# Patient Record
Sex: Male | Born: 1940 | Race: Black or African American | Hispanic: No | Marital: Single | Smoking: Current every day smoker
Health system: Southern US, Community
[De-identification: ages and names within clinical notes are randomized; demographics above are authoritative.]

## PROBLEM LIST (undated history)

## (undated) DIAGNOSIS — H3410 Central retinal artery occlusion, unspecified eye: Secondary | ICD-10-CM

## (undated) DIAGNOSIS — C189 Malignant neoplasm of colon, unspecified: Secondary | ICD-10-CM

---

## 2019-09-05 ENCOUNTER — Emergency Department (HOSPITAL_COMMUNITY): Payer: Medicare PPO

## 2019-09-05 ENCOUNTER — Emergency Department (HOSPITAL_COMMUNITY): Payer: Medicare PPO | Admitting: Anesthesiology

## 2019-09-05 ENCOUNTER — Encounter (HOSPITAL_COMMUNITY): Payer: Self-pay

## 2019-09-05 ENCOUNTER — Encounter (HOSPITAL_COMMUNITY): Admission: EM | Disposition: A | Discharge status: Home Health | Payer: Self-pay | Source: Home / Self Care

## 2019-09-05 ENCOUNTER — Inpatient Hospital Stay (HOSPITAL_COMMUNITY)
Admission: EM | Admit: 2019-09-05 | Discharge: 2019-09-20 | DRG: 326 | Disposition: A | Discharge status: Home Health | Payer: Medicare PPO | Attending: General Surgery | Admitting: General Surgery

## 2019-09-05 ENCOUNTER — Other Ambulatory Visit: Payer: Self-pay

## 2019-09-05 DIAGNOSIS — K650 Generalized (acute) peritonitis: Secondary | ICD-10-CM | POA: Diagnosis present

## 2019-09-05 DIAGNOSIS — E860 Dehydration: Secondary | ICD-10-CM | POA: Diagnosis present

## 2019-09-05 DIAGNOSIS — D689 Coagulation defect, unspecified: Secondary | ICD-10-CM

## 2019-09-05 DIAGNOSIS — Z79899 Other long term (current) drug therapy: Secondary | ICD-10-CM | POA: Diagnosis not present

## 2019-09-05 DIAGNOSIS — T8143XA Infection following a procedure, organ and space surgical site, initial encounter: Secondary | ICD-10-CM

## 2019-09-05 DIAGNOSIS — R188 Other ascites: Secondary | ICD-10-CM

## 2019-09-05 DIAGNOSIS — J90 Pleural effusion, not elsewhere classified: Secondary | ICD-10-CM | POA: Diagnosis present

## 2019-09-05 DIAGNOSIS — K750 Abscess of liver: Secondary | ICD-10-CM | POA: Diagnosis present

## 2019-09-05 DIAGNOSIS — I10 Essential (primary) hypertension: Secondary | ICD-10-CM | POA: Diagnosis present

## 2019-09-05 DIAGNOSIS — K567 Ileus, unspecified: Secondary | ICD-10-CM

## 2019-09-05 DIAGNOSIS — J9819 Other pulmonary collapse: Secondary | ICD-10-CM | POA: Diagnosis not present

## 2019-09-05 DIAGNOSIS — K255 Chronic or unspecified gastric ulcer with perforation: Secondary | ICD-10-CM | POA: Diagnosis present

## 2019-09-05 DIAGNOSIS — F17210 Nicotine dependence, cigarettes, uncomplicated: Secondary | ICD-10-CM | POA: Diagnosis present

## 2019-09-05 DIAGNOSIS — K9189 Other postprocedural complications and disorders of digestive system: Secondary | ICD-10-CM | POA: Diagnosis present

## 2019-09-05 DIAGNOSIS — R198 Other specified symptoms and signs involving the digestive system and abdomen: Secondary | ICD-10-CM

## 2019-09-05 DIAGNOSIS — N289 Disorder of kidney and ureter, unspecified: Secondary | ICD-10-CM | POA: Diagnosis present

## 2019-09-05 DIAGNOSIS — Z20822 Contact with and (suspected) exposure to covid-19: Secondary | ICD-10-CM | POA: Diagnosis present

## 2019-09-05 DIAGNOSIS — N401 Enlarged prostate with lower urinary tract symptoms: Secondary | ICD-10-CM | POA: Diagnosis present

## 2019-09-05 DIAGNOSIS — E279 Disorder of adrenal gland, unspecified: Secondary | ICD-10-CM | POA: Diagnosis present

## 2019-09-05 DIAGNOSIS — B9681 Helicobacter pylori [H. pylori] as the cause of diseases classified elsewhere: Secondary | ICD-10-CM | POA: Diagnosis present

## 2019-09-05 DIAGNOSIS — K668 Other specified disorders of peritoneum: Secondary | ICD-10-CM

## 2019-09-05 DIAGNOSIS — K651 Peritoneal abscess: Secondary | ICD-10-CM | POA: Diagnosis present

## 2019-09-05 DIAGNOSIS — R066 Hiccough: Secondary | ICD-10-CM | POA: Diagnosis not present

## 2019-09-05 DIAGNOSIS — K913 Postprocedural intestinal obstruction, unspecified as to partial versus complete: Secondary | ICD-10-CM | POA: Diagnosis not present

## 2019-09-05 DIAGNOSIS — R0602 Shortness of breath: Secondary | ICD-10-CM

## 2019-09-05 DIAGNOSIS — K265 Chronic or unspecified duodenal ulcer with perforation: Secondary | ICD-10-CM | POA: Diagnosis present

## 2019-09-05 HISTORY — PX: BOWEL RESECTION: SHX1257

## 2019-09-05 HISTORY — DX: Disorder of kidney and ureter, unspecified: N28.9

## 2019-09-05 HISTORY — PX: LAPAROTOMY: SHX154

## 2019-09-05 HISTORY — DX: Chronic or unspecified gastric ulcer with perforation: K25.5

## 2019-09-05 LAB — CBC WITH DIFFERENTIAL/PLATELET
Abs Immature Granulocytes: 0.01 10*3/uL (ref 0.00–0.07)
Basophils Absolute: 0 10*3/uL (ref 0.0–0.1)
Basophils Relative: 1 %
Eosinophils Absolute: 0 10*3/uL (ref 0.0–0.5)
Eosinophils Relative: 0 %
HCT: 59.7 % — ABNORMAL HIGH (ref 39.0–52.0)
Hemoglobin: 18.9 g/dL — ABNORMAL HIGH (ref 13.0–17.0)
Immature Granulocytes: 0 %
Lymphocytes Relative: 12 %
Lymphs Abs: 0.5 10*3/uL — ABNORMAL LOW (ref 0.7–4.0)
MCH: 26.7 pg (ref 26.0–34.0)
MCHC: 31.7 g/dL (ref 30.0–36.0)
MCV: 84.4 fL (ref 80.0–100.0)
Monocytes Absolute: 0.3 10*3/uL (ref 0.1–1.0)
Monocytes Relative: 7 %
Neutro Abs: 3.2 10*3/uL (ref 1.7–7.7)
Neutrophils Relative %: 80 %
Platelets: 273 10*3/uL (ref 150–400)
RBC: 7.07 MIL/uL — ABNORMAL HIGH (ref 4.22–5.81)
RDW: 15.4 % (ref 11.5–15.5)
WBC: 4.1 10*3/uL (ref 4.0–10.5)
nRBC: 0 % (ref 0.0–0.2)

## 2019-09-05 LAB — COMPREHENSIVE METABOLIC PANEL
ALT: 17 U/L (ref 0–44)
AST: 29 U/L (ref 15–41)
Albumin: 3.8 g/dL (ref 3.5–5.0)
Alkaline Phosphatase: 85 U/L (ref 38–126)
Anion gap: 15 (ref 5–15)
BUN: 15 mg/dL (ref 8–23)
CO2: 21 mmol/L — ABNORMAL LOW (ref 22–32)
Calcium: 9.1 mg/dL (ref 8.9–10.3)
Chloride: 101 mmol/L (ref 98–111)
Creatinine, Ser: 1.4 mg/dL — ABNORMAL HIGH (ref 0.61–1.24)
GFR calc Af Amer: 56 mL/min — ABNORMAL LOW (ref >60)
GFR calc non Af Amer: 48 mL/min — ABNORMAL LOW (ref >60)
Glucose, Bld: 160 mg/dL — ABNORMAL HIGH (ref 70–99)
Potassium: 4.8 mmol/L (ref 3.5–5.1)
Sodium: 137 mmol/L (ref 135–145)
Total Bilirubin: 1.1 mg/dL (ref 0.3–1.2)
Total Protein: 7.4 g/dL (ref 6.5–8.1)

## 2019-09-05 LAB — TYPE AND SCREEN
ABO/RH(D): O POS
Antibody Screen: NEGATIVE

## 2019-09-05 LAB — RESPIRATORY PANEL BY RT PCR (FLU A&B, COVID)
Influenza A by PCR: NEGATIVE
Influenza B by PCR: NEGATIVE
SARS Coronavirus 2 by RT PCR: NEGATIVE

## 2019-09-05 LAB — APTT: aPTT: 78 seconds — ABNORMAL HIGH (ref 24–36)

## 2019-09-05 LAB — LIPASE, BLOOD: Lipase: 21 U/L (ref 11–51)

## 2019-09-05 LAB — ABO/RH: ABO/RH(D): O POS

## 2019-09-05 LAB — MRSA PCR SCREENING: MRSA by PCR: NEGATIVE

## 2019-09-05 LAB — PROTIME-INR
INR: 4.8 (ref 0.8–1.2)
INR: 1.6 — ABNORMAL HIGH (ref 0.8–1.2)
Prothrombin Time: 44.9 seconds — ABNORMAL HIGH (ref 11.4–15.2)
Prothrombin Time: 19.1 seconds — ABNORMAL HIGH (ref 11.4–15.2)

## 2019-09-05 IMAGING — CT CT ABD-PELV W/ CM
2 of 5 series · 15 of 46 positions shown, 17 images · IV contrast (omnipaque)
Comparison: None.

CLINICAL DATA: Onset abdominal pain yesterday afternoon after
eating.

EXAM:
CT ABDOMEN AND PELVIS WITH CONTRAST
TECHNIQUE: Multidetector CT imaging of the abdomen and pelvis was performed
using the standard protocol following bolus administration of
intravenous contrast.
CONTRAST:  100mL OMNIPAQUE IOHEXOL 300 MG/ML  SOLN

[Series 2: axial st · axial · 0.68mm/px · z∈[-414,-19]mm · 12 of 93 slices shown, 14 images]
[im 7/93  soft-tissue]
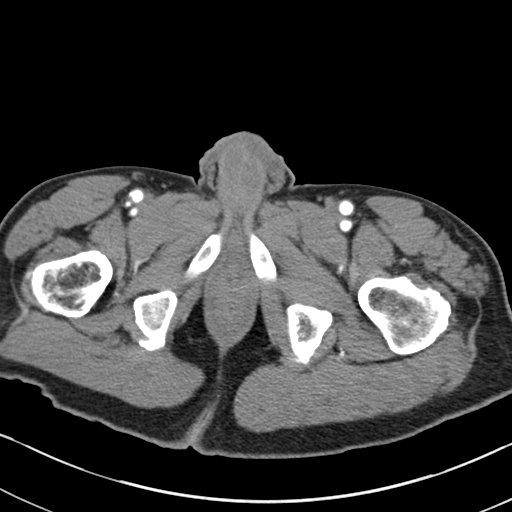
[im 7/93  bone]
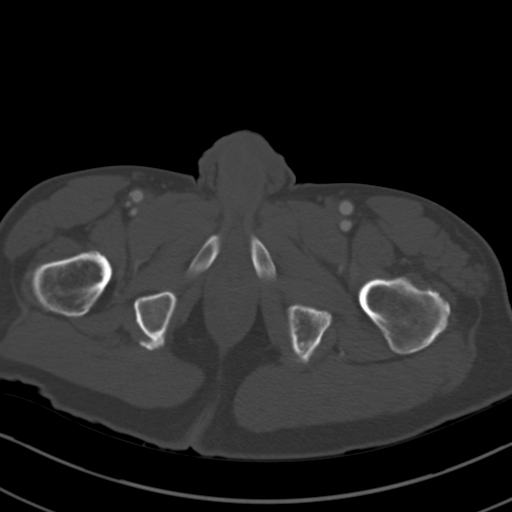
[im 13/93  soft-tissue]
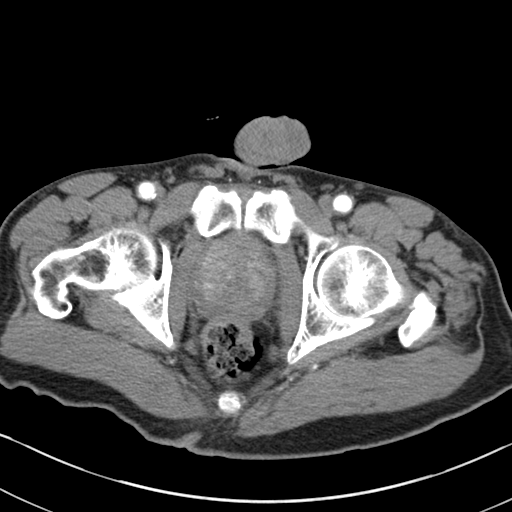
[im 19/93  soft-tissue]
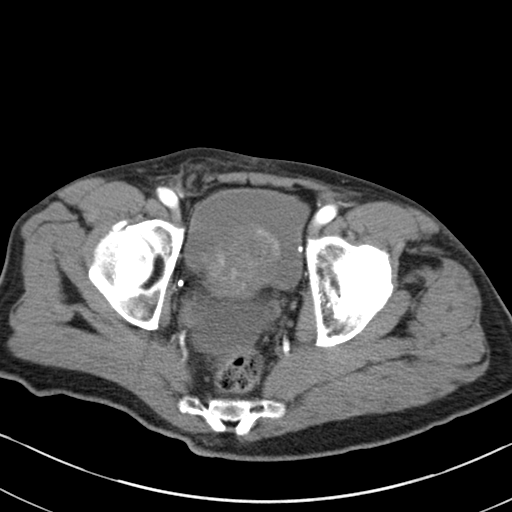
[im 31/93  soft-tissue]
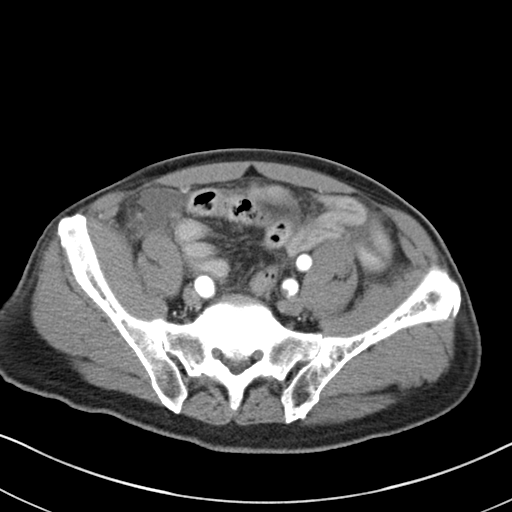
[im 37/93  soft-tissue]
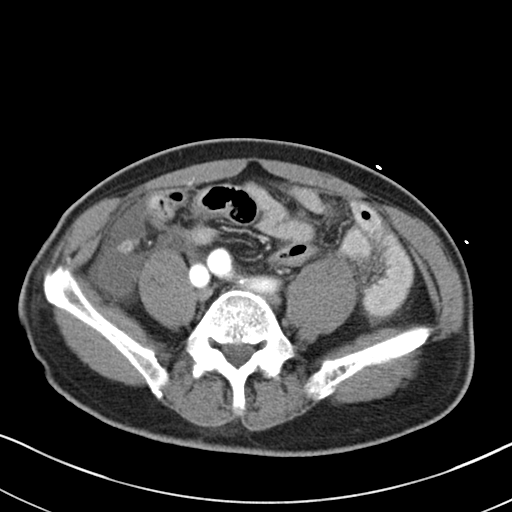
[im 43/93  soft-tissue]
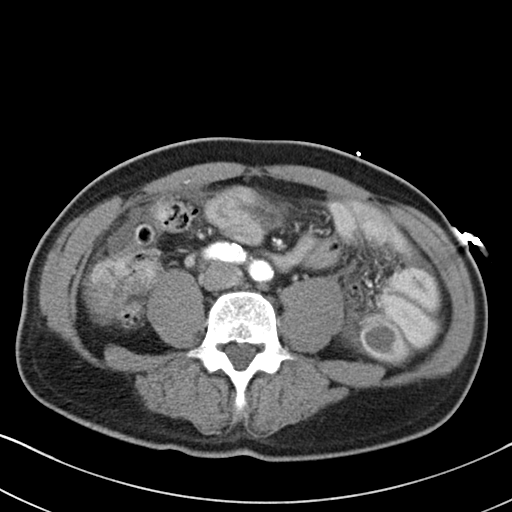
[im 50/93  soft-tissue]
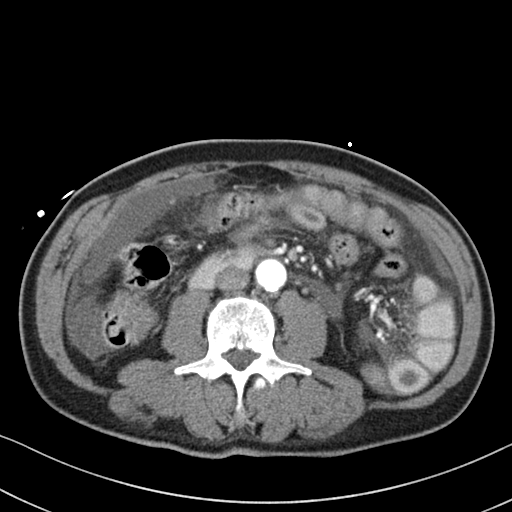
[im 56/93  soft-tissue]
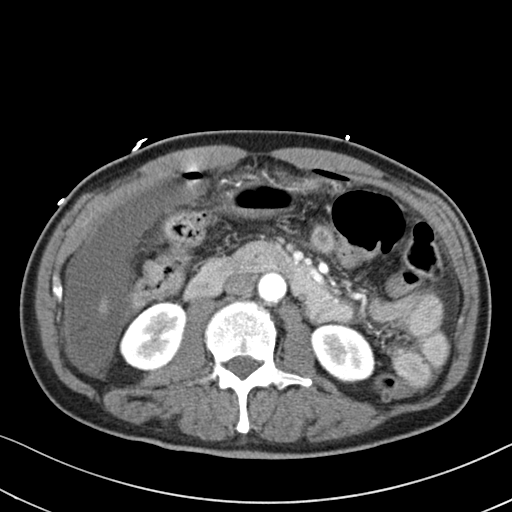
[im 62/93  soft-tissue]
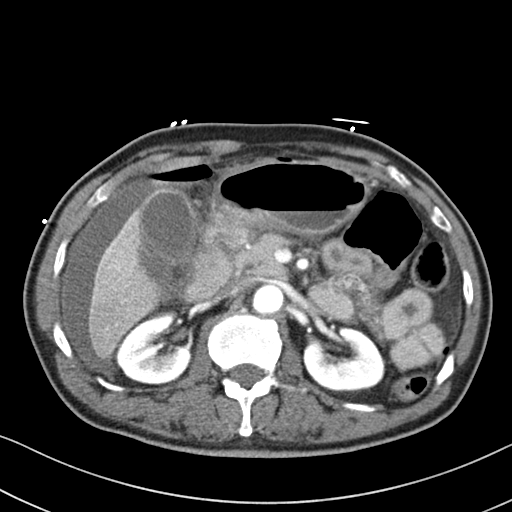
[im 62/93  bone]
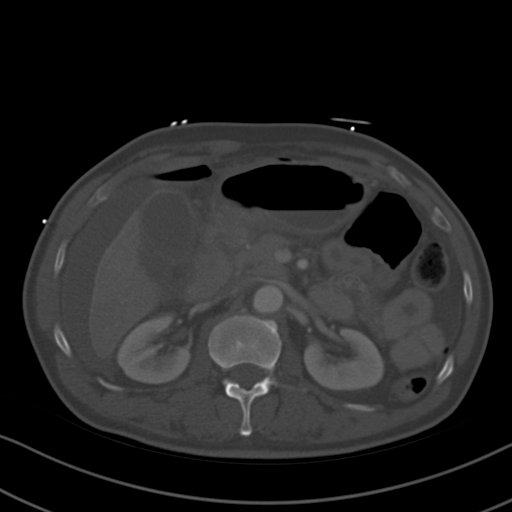
[im 74/93  soft-tissue]
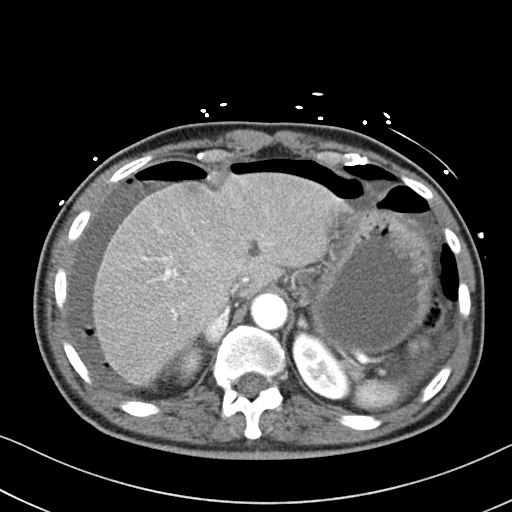
[im 80/93  soft-tissue]
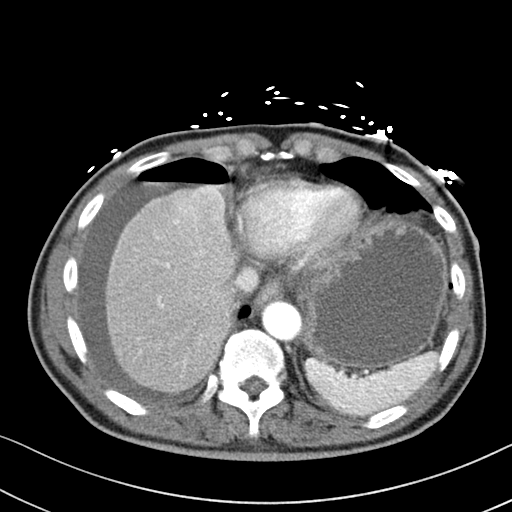
[im 86/93  soft-tissue]
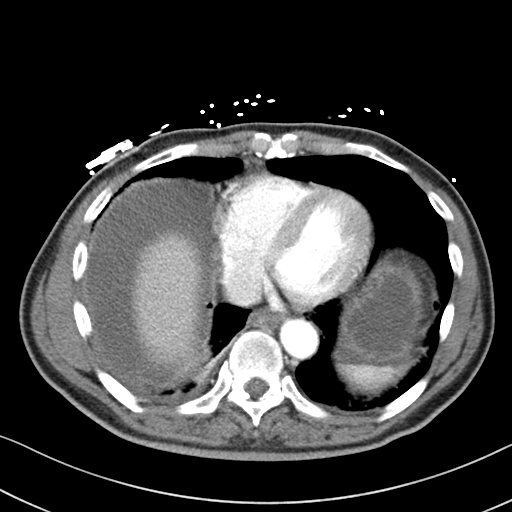

[Series 5: coronal st · coronal · 0.67mm/px · 3 of 123 slices shown]
[im 41/123  soft-tissue]
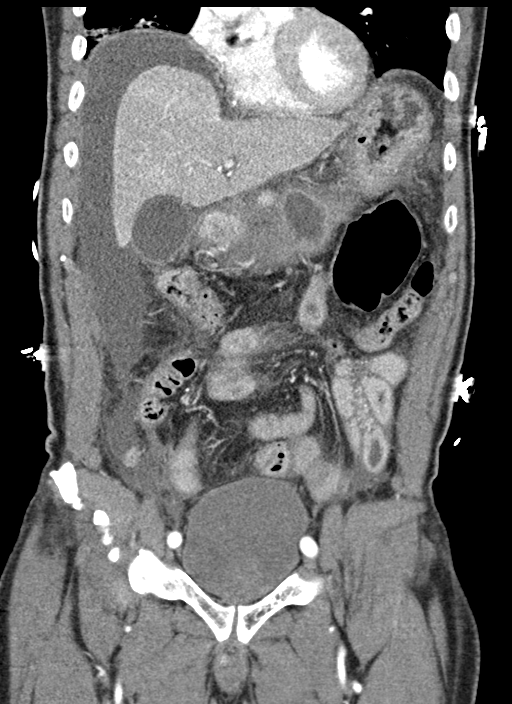
[im 55/123  soft-tissue]
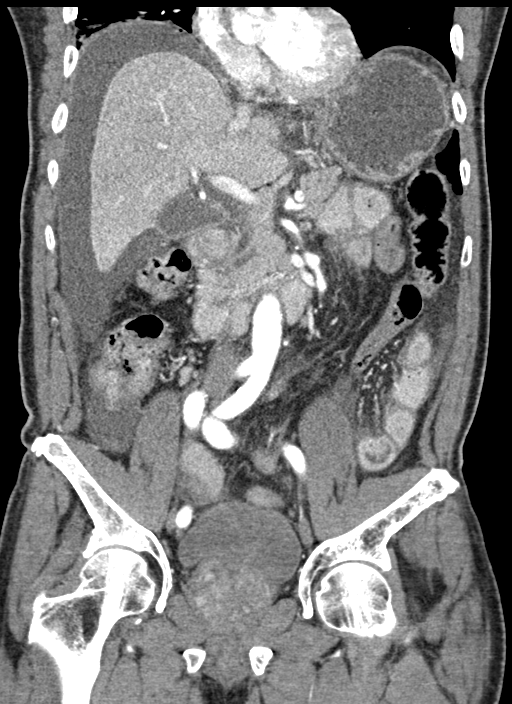
[im 68/123  soft-tissue]
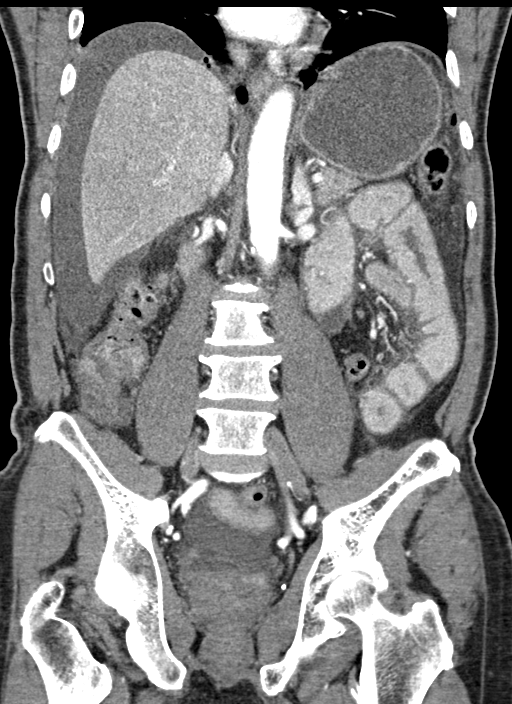

[15 of 46 positions shown; findings below may reference images not displayed]

FINDINGS: Lower chest: Small right and trace left pleural effusions. Basilar
atelectasis on the right.

Hepatobiliary: No focal liver abnormality is seen. No gallstones,
gallbladder wall thickening, or biliary dilatation.

Pancreas: Unremarkable. No pancreatic ductal dilatation or
surrounding inflammatory changes.

Spleen: Normal in size without focal abnormality.

Adrenals/Urinary Tract: Adrenal glands are unremarkable. Kidneys are
normal, without renal calculi, focal lesion, or hydronephrosis.
Bladder is unremarkable.

Stomach/Bowel: There is a large volume of free intraperitoneal air
and moderate free fluid. The walls of the duodenum are thickened and
hyperenhancing and a focal perforation in the medial wall of the
duodenum is best seen on image 16 of series 7. Abnormal enhancement
and wall thickening in multiple loops of small bowel in the left
abdomen is likely reactive from perforation and free fluid. The
colon and appendix are unremarkable. There is no pneumatosis, portal
venous gas or free intraperitoneal air. No evidence of bowel
obstruction.

Vascular/Lymphatic: No significant vascular findings are present. No
enlarged abdominal or pelvic lymph nodes.

Reproductive: Marked prostatomegaly.

Other: None.

Musculoskeletal: No acute or focal abnormality.
IMPRESSION: Findings most consistent with perforation of the duodenum with an
associated free intraperitoneal air and fluid.

Small right and trace left pleural effusions.

Marked prostatomegaly.

Critical Value/emergent results were called by telephone at the time
, who verbally acknowledged these results.

## 2019-09-05 IMAGING — DX DG CHEST 1V PORT
1 series · 1 of 1 positions shown · non-contrast
Comparison: None.

CLINICAL DATA: Abdominal pain, nausea

EXAM:
PORTABLE CHEST 1 VIEW

[chest ap]
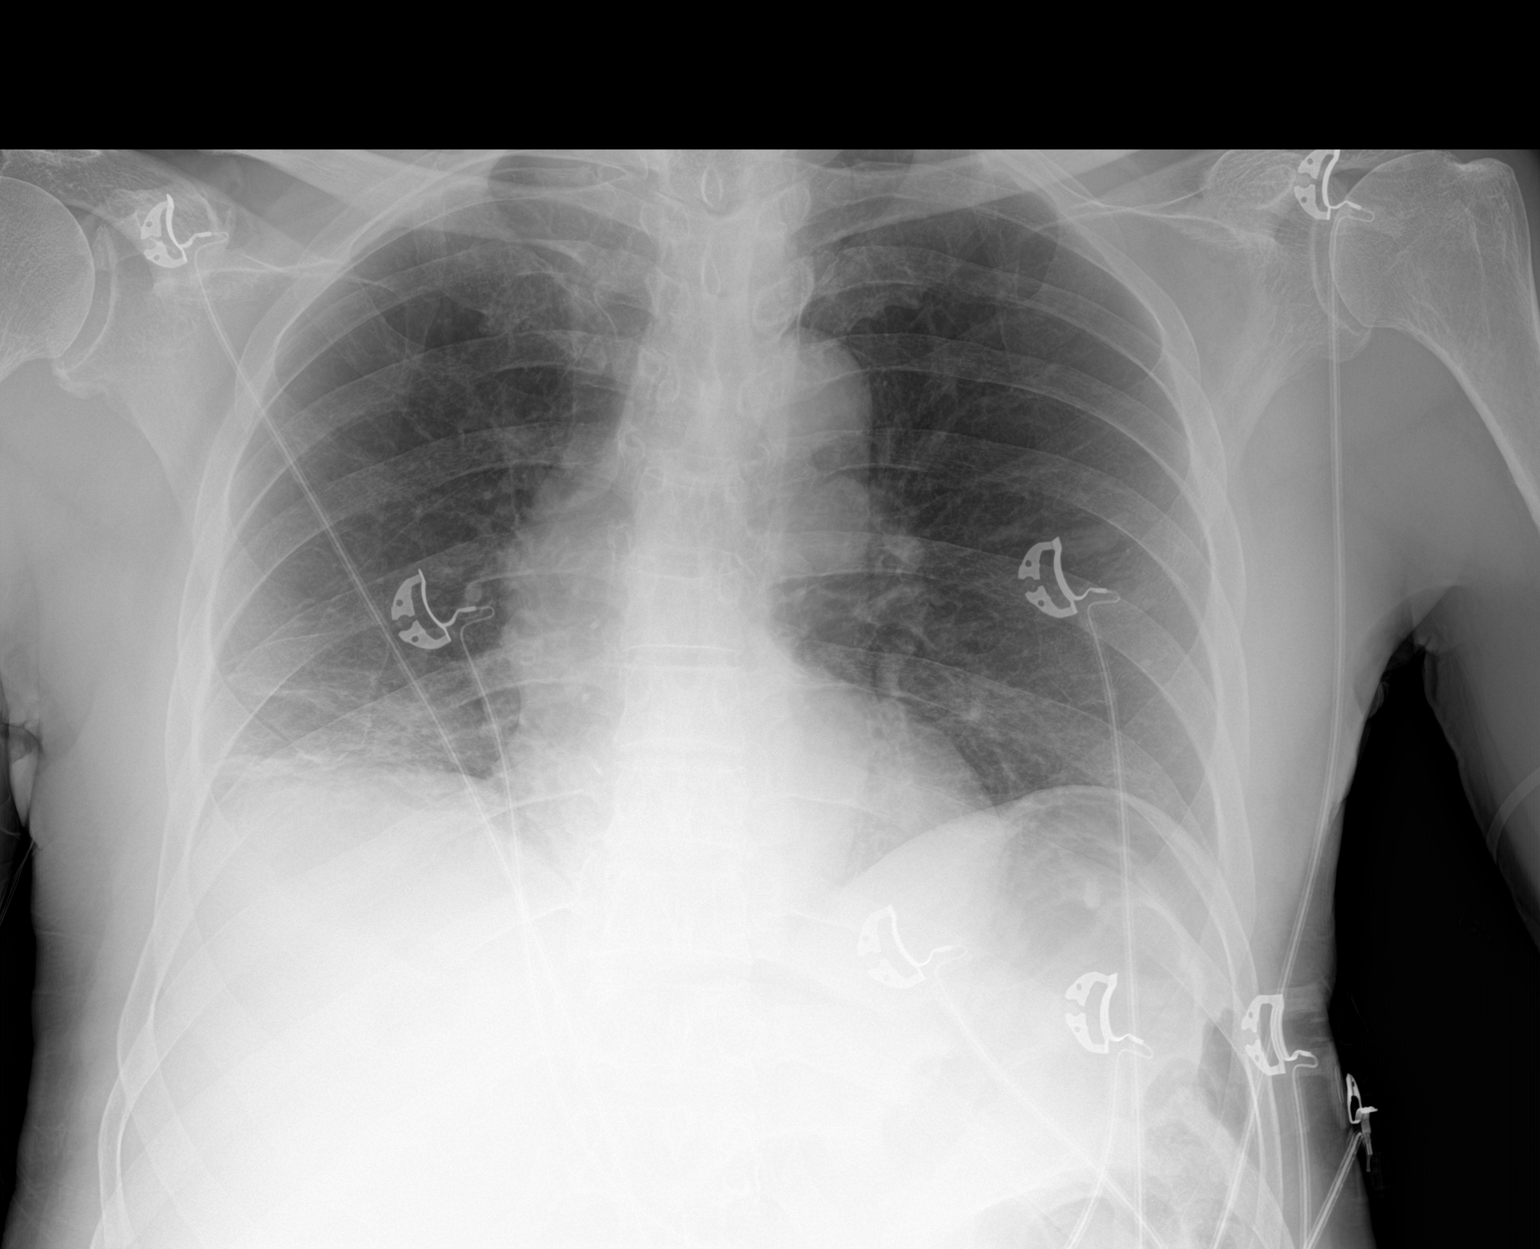

[1 of 1 positions shown; findings below may reference images not displayed]

FINDINGS: The heart size and mediastinal contours are within normal limits.
Streaky opacity within the right lung base. Left lung appears clear.
No pleural effusion or pneumothorax. No acute osseous findings.
IMPRESSION: Streaky opacity within the right lung base which may reflect
atelectasis or pneumonia.

## 2019-09-05 SURGERY — LAPAROTOMY, EXPLORATORY
Anesthesia: General | Site: Abdomen

## 2019-09-05 MED ORDER — OXYCODONE HCL 5 MG/5ML PO SOLN: 5.0000 mg | Freq: Once | ORAL | Status: DC | PRN

## 2019-09-05 MED ORDER — MAGIC MOUTHWASH
15.0000 mL | Freq: Four times a day (QID) | ORAL | Status: DC | PRN
  Administered 2019-09-05 – 2019-09-06 (×2): 15 mL via ORAL
  Filled 2019-09-05 (×3): qty 15

## 2019-09-05 MED ORDER — SODIUM CHLORIDE 0.9 % IV SOLN
INTRAVENOUS | Status: DC | PRN
  Administered 2019-09-05: 250 mL via INTRAVENOUS

## 2019-09-05 MED ORDER — LACTATED RINGERS IV BOLUS: 1000.0000 mL | Freq: Three times a day (TID) | INTRAVENOUS | Status: AC | PRN

## 2019-09-05 MED ORDER — SODIUM CHLORIDE 0.9 % IV SOLN
8.0000 mg/h | INTRAVENOUS | Status: AC
  Administered 2019-09-05 – 2019-09-09 (×6): 8 mg/h via INTRAVENOUS
  Filled 2019-09-05 (×12): qty 80

## 2019-09-05 MED ORDER — ADULT MULTIVITAMIN W/MINERALS CH
1.0000 | ORAL_TABLET | Freq: Every day | ORAL | Status: DC
  Administered 2019-09-06 – 2019-09-14 (×8): 1 via ORAL
  Filled 2019-09-05 (×8): qty 1

## 2019-09-05 MED ORDER — IOHEXOL 300 MG/ML  SOLN
100.0000 mL | Freq: Once | INTRAMUSCULAR | Status: AC | PRN
  Administered 2019-09-05: 100 mL via INTRAVENOUS

## 2019-09-05 MED ORDER — ONDANSETRON HCL 4 MG/2ML IJ SOLN
INTRAMUSCULAR | Status: AC
  Filled 2019-09-05: qty 2

## 2019-09-05 MED ORDER — LORAZEPAM 1 MG PO TABS: 1.0000 mg | ORAL_TABLET | ORAL | Status: AC | PRN

## 2019-09-05 MED ORDER — OXYCODONE HCL 5 MG PO TABS: 5.0000 mg | ORAL_TABLET | Freq: Once | ORAL | Status: DC | PRN

## 2019-09-05 MED ORDER — ROCURONIUM BROMIDE 100 MG/10ML IV SOLN
INTRAVENOUS | Status: DC | PRN
  Administered 2019-09-05: 5 mg via INTRAVENOUS
  Administered 2019-09-05: 20 mg via INTRAVENOUS

## 2019-09-05 MED ORDER — MEPERIDINE HCL 50 MG/ML IJ SOLN: 6.2500 mg | INTRAMUSCULAR | Status: DC | PRN

## 2019-09-05 MED ORDER — MIDAZOLAM HCL 2 MG/2ML IJ SOLN
INTRAMUSCULAR | Status: AC
  Filled 2019-09-05: qty 2

## 2019-09-05 MED ORDER — FENTANYL CITRATE (PF) 100 MCG/2ML IJ SOLN
INTRAMUSCULAR | Status: DC | PRN
  Administered 2019-09-05 (×4): 50 ug via INTRAVENOUS
  Administered 2019-09-05 (×2): 25 ug via INTRAVENOUS

## 2019-09-05 MED ORDER — SODIUM CHLORIDE 0.9% IV SOLUTION: Freq: Once | INTRAVENOUS | Status: DC

## 2019-09-05 MED ORDER — MIDAZOLAM HCL 5 MG/5ML IJ SOLN
INTRAMUSCULAR | Status: DC | PRN
  Administered 2019-09-05 (×2): .5 mg via INTRAVENOUS

## 2019-09-05 MED ORDER — HYDROMORPHONE HCL 1 MG/ML IJ SOLN
1.0000 mg | INTRAMUSCULAR | Status: DC | PRN
  Administered 2019-09-05 – 2019-09-06 (×2): 1 mg via INTRAVENOUS
  Filled 2019-09-05 (×2): qty 1

## 2019-09-05 MED ORDER — FOLIC ACID 1 MG PO TABS
1.0000 mg | ORAL_TABLET | Freq: Every day | ORAL | Status: DC
  Administered 2019-09-06 – 2019-09-20 (×14): 1 mg via ORAL
  Filled 2019-09-05 (×14): qty 1

## 2019-09-05 MED ORDER — EPHEDRINE 5 MG/ML INJ
INTRAVENOUS | Status: AC
  Filled 2019-09-05: qty 10

## 2019-09-05 MED ORDER — HYDROMORPHONE HCL 1 MG/ML IJ SOLN
1.0000 mg | Freq: Once | INTRAMUSCULAR | Status: AC
  Administered 2019-09-05: 1 mg via INTRAVENOUS
  Filled 2019-09-05: qty 1

## 2019-09-05 MED ORDER — ONDANSETRON HCL 4 MG/2ML IJ SOLN: 4.0000 mg | Freq: Once | INTRAMUSCULAR | Status: DC | PRN

## 2019-09-05 MED ORDER — PIPERACILLIN-TAZOBACTAM 3.375 G IVPB
3.3750 g | Freq: Three times a day (TID) | INTRAVENOUS | Status: DC
  Administered 2019-09-05 – 2019-09-12 (×20): 3.375 g via INTRAVENOUS
  Filled 2019-09-05 (×18): qty 50

## 2019-09-05 MED ORDER — DIPHENHYDRAMINE HCL 50 MG/ML IJ SOLN
12.5000 mg | Freq: Four times a day (QID) | INTRAMUSCULAR | Status: DC | PRN
  Administered 2019-09-17: 25 mg via INTRAVENOUS
  Filled 2019-09-05: qty 1

## 2019-09-05 MED ORDER — SODIUM CHLORIDE 0.9 % IV SOLN
200.0000 mg | Freq: Once | INTRAVENOUS | Status: DC
  Filled 2019-09-05: qty 200

## 2019-09-05 MED ORDER — ACETAMINOPHEN 160 MG/5ML PO SOLN: 325.0000 mg | ORAL | Status: DC | PRN

## 2019-09-05 MED ORDER — PROCHLORPERAZINE EDISYLATE 10 MG/2ML IJ SOLN
5.0000 mg | INTRAMUSCULAR | Status: DC | PRN
  Administered 2019-09-11 – 2019-09-12 (×2): 5 mg via INTRAVENOUS
  Administered 2019-09-12 – 2019-09-13 (×2): 10 mg via INTRAVENOUS
  Filled 2019-09-05 (×5): qty 2

## 2019-09-05 MED ORDER — METOPROLOL TARTRATE 5 MG/5ML IV SOLN
2.5000 mg | Freq: Four times a day (QID) | INTRAVENOUS | Status: DC | PRN
  Administered 2019-09-07: 2.5 mg via INTRAVENOUS
  Administered 2019-09-07: 5 mg via INTRAVENOUS
  Filled 2019-09-05: qty 5

## 2019-09-05 MED ORDER — SODIUM CHLORIDE 0.9 % IV SOLN: Freq: Once | INTRAVENOUS | Status: AC

## 2019-09-05 MED ORDER — METHOCARBAMOL 1000 MG/10ML IJ SOLN
1000.0000 mg | Freq: Four times a day (QID) | INTRAVENOUS | Status: DC | PRN
  Administered 2019-09-15: 1000 mg via INTRAVENOUS
  Filled 2019-09-05 (×2): qty 10

## 2019-09-05 MED ORDER — SODIUM CHLORIDE (PF) 0.9 % IJ SOLN
INTRAMUSCULAR | Status: AC
  Filled 2019-09-05: qty 50

## 2019-09-05 MED ORDER — ONDANSETRON HCL 4 MG/2ML IJ SOLN: 4.0000 mg | Freq: Four times a day (QID) | INTRAMUSCULAR | Status: DC | PRN

## 2019-09-05 MED ORDER — SUCCINYLCHOLINE CHLORIDE 20 MG/ML IJ SOLN
INTRAMUSCULAR | Status: DC | PRN
  Administered 2019-09-05: 100 mg via INTRAVENOUS

## 2019-09-05 MED ORDER — LIDOCAINE 2% (20 MG/ML) 5 ML SYRINGE
INTRAMUSCULAR | Status: AC
  Filled 2019-09-05: qty 5

## 2019-09-05 MED ORDER — THIAMINE HCL 100 MG PO TABS
100.0000 mg | ORAL_TABLET | Freq: Every day | ORAL | Status: DC
  Administered 2019-09-06 – 2019-09-20 (×14): 100 mg via ORAL
  Filled 2019-09-05 (×14): qty 1

## 2019-09-05 MED ORDER — SODIUM CHLORIDE 0.9 % IV SOLN
80.0000 mg | Freq: Once | INTRAVENOUS | Status: AC
  Administered 2019-09-05: 80 mg via INTRAVENOUS
  Filled 2019-09-05: qty 80

## 2019-09-05 MED ORDER — DEXTROSE-NACL 5-0.45 % IV SOLN: INTRAVENOUS | Status: DC

## 2019-09-05 MED ORDER — SODIUM CHLORIDE 0.9 % IV SOLN: 100.0000 mg | INTRAVENOUS | Status: DC

## 2019-09-05 MED ORDER — SUGAMMADEX SODIUM 200 MG/2ML IV SOLN
INTRAVENOUS | Status: DC | PRN
  Administered 2019-09-05: 200 mg via INTRAVENOUS

## 2019-09-05 MED ORDER — FENTANYL CITRATE (PF) 250 MCG/5ML IJ SOLN
INTRAMUSCULAR | Status: AC
  Filled 2019-09-05: qty 5

## 2019-09-05 MED ORDER — LACTATED RINGERS IV SOLN: INTRAVENOUS | Status: DC

## 2019-09-05 MED ORDER — ONDANSETRON HCL 4 MG/2ML IJ SOLN
4.0000 mg | Freq: Once | INTRAMUSCULAR | Status: AC
  Administered 2019-09-05: 4 mg via INTRAVENOUS
  Filled 2019-09-05: qty 2

## 2019-09-05 MED ORDER — LIP MEDEX EX OINT
1.0000 "application " | TOPICAL_OINTMENT | Freq: Two times a day (BID) | CUTANEOUS | Status: DC
  Administered 2019-09-05 – 2019-09-19 (×19): 1 via TOPICAL
  Filled 2019-09-05 (×8): qty 7

## 2019-09-05 MED ORDER — FENTANYL CITRATE (PF) 100 MCG/2ML IJ SOLN
INTRAMUSCULAR | Status: AC
  Filled 2019-09-05: qty 4

## 2019-09-05 MED ORDER — LABETALOL HCL 5 MG/ML IV SOLN
INTRAVENOUS | Status: DC | PRN
  Administered 2019-09-05: 5 mg via INTRAVENOUS

## 2019-09-05 MED ORDER — FENTANYL CITRATE (PF) 100 MCG/2ML IJ SOLN
25.0000 ug | INTRAMUSCULAR | Status: DC | PRN
  Administered 2019-09-05: 25 ug via INTRAVENOUS
  Administered 2019-09-05: 50 ug via INTRAVENOUS

## 2019-09-05 MED ORDER — METOPROLOL TARTRATE 5 MG/5ML IV SOLN
5.0000 mg | Freq: Four times a day (QID) | INTRAVENOUS | Status: DC | PRN
  Filled 2019-09-05: qty 5

## 2019-09-05 MED ORDER — PROPOFOL 10 MG/ML IV BOLUS
INTRAVENOUS | Status: DC | PRN
  Administered 2019-09-05: 100 mg via INTRAVENOUS

## 2019-09-05 MED ORDER — PROPOFOL 10 MG/ML IV BOLUS
INTRAVENOUS | Status: AC
  Filled 2019-09-05: qty 20

## 2019-09-05 MED ORDER — BISACODYL 10 MG RE SUPP: 10.0000 mg | Freq: Two times a day (BID) | RECTAL | Status: DC | PRN

## 2019-09-05 MED ORDER — ORAL CARE MOUTH RINSE
15.0000 mL | Freq: Two times a day (BID) | OROMUCOSAL | Status: DC
  Administered 2019-09-05 – 2019-09-20 (×21): 15 mL via OROMUCOSAL

## 2019-09-05 MED ORDER — PIPERACILLIN-TAZOBACTAM 3.375 G IVPB 30 MIN
3.3750 g | Freq: Once | INTRAVENOUS | Status: AC
  Administered 2019-09-05: 3.375 g via INTRAVENOUS
  Filled 2019-09-05: qty 50

## 2019-09-05 MED ORDER — HYDROMORPHONE HCL 1 MG/ML IJ SOLN: 1.0000 mg | INTRAMUSCULAR | Status: DC | PRN

## 2019-09-05 MED ORDER — HYDROMORPHONE HCL 1 MG/ML IJ SOLN
0.5000 mg | Freq: Once | INTRAMUSCULAR | Status: AC
  Administered 2019-09-05: 0.5 mg via INTRAVENOUS
  Filled 2019-09-05: qty 1

## 2019-09-05 MED ORDER — ROCURONIUM BROMIDE 10 MG/ML (PF) SYRINGE
PREFILLED_SYRINGE | INTRAVENOUS | Status: AC
  Filled 2019-09-05: qty 10

## 2019-09-05 MED ORDER — ONDANSETRON 4 MG PO TBDP: 4.0000 mg | ORAL_TABLET | Freq: Four times a day (QID) | ORAL | Status: DC | PRN

## 2019-09-05 MED ORDER — CHLORHEXIDINE GLUCONATE CLOTH 2 % EX PADS
6.0000 | MEDICATED_PAD | Freq: Every day | CUTANEOUS | Status: DC
  Administered 2019-09-05 – 2019-09-12 (×8): 6 via TOPICAL

## 2019-09-05 MED ORDER — ONDANSETRON HCL 4 MG/2ML IJ SOLN
4.0000 mg | Freq: Four times a day (QID) | INTRAMUSCULAR | Status: DC | PRN
  Administered 2019-09-11 – 2019-09-16 (×4): 4 mg via INTRAVENOUS
  Filled 2019-09-05 (×5): qty 2

## 2019-09-05 MED ORDER — SODIUM CHLORIDE 0.9 % IV BOLUS
1000.0000 mL | Freq: Once | INTRAVENOUS | Status: AC
  Administered 2019-09-05: 1000 mL via INTRAVENOUS

## 2019-09-05 MED ORDER — ACETAMINOPHEN 325 MG PO TABS: 325.0000 mg | ORAL_TABLET | ORAL | Status: DC | PRN

## 2019-09-05 MED ORDER — LIDOCAINE HCL (CARDIAC) PF 100 MG/5ML IV SOSY
PREFILLED_SYRINGE | INTRAVENOUS | Status: DC | PRN
  Administered 2019-09-05: 40 mg via INTRAVENOUS

## 2019-09-05 MED ORDER — PHENYLEPHRINE HCL-NACL 10-0.9 MG/250ML-% IV SOLN
INTRAVENOUS | Status: DC | PRN
  Administered 2019-09-05: 20 ug/min via INTRAVENOUS

## 2019-09-05 MED ORDER — LACTATED RINGERS IV SOLN: INTRAVENOUS | Status: DC | PRN

## 2019-09-05 MED ORDER — METOPROLOL TARTRATE 5 MG/5ML IV SOLN: 5.0000 mg | Freq: Four times a day (QID) | INTRAVENOUS | Status: DC | PRN

## 2019-09-05 MED ORDER — LACTATED RINGERS IV BOLUS
1000.0000 mL | Freq: Once | INTRAVENOUS | Status: AC
  Administered 2019-09-06: 1000 mL via INTRAVENOUS

## 2019-09-05 MED ORDER — 0.9 % SODIUM CHLORIDE (POUR BTL) OPTIME
TOPICAL | Status: DC | PRN
  Administered 2019-09-05: 14:00:00 6000 mL

## 2019-09-05 MED ORDER — LABETALOL HCL 5 MG/ML IV SOLN
INTRAVENOUS | Status: AC
  Filled 2019-09-05: qty 4

## 2019-09-05 MED ORDER — PANTOPRAZOLE SODIUM 40 MG IV SOLR: 40.0000 mg | Freq: Two times a day (BID) | INTRAVENOUS | Status: DC

## 2019-09-05 MED ORDER — EPHEDRINE SULFATE 50 MG/ML IJ SOLN
INTRAMUSCULAR | Status: DC | PRN
  Administered 2019-09-05: 5 mg via INTRAVENOUS

## 2019-09-05 MED ORDER — ORAL CARE MOUTH RINSE
15.0000 mL | Freq: Two times a day (BID) | OROMUCOSAL | Status: DC
  Administered 2019-09-05 – 2019-09-11 (×11): 15 mL via OROMUCOSAL

## 2019-09-05 MED ORDER — ALBUMIN HUMAN 5 % IV SOLN: INTRAVENOUS | Status: DC | PRN

## 2019-09-05 MED ORDER — ONDANSETRON HCL 4 MG/2ML IJ SOLN
INTRAMUSCULAR | Status: DC | PRN
  Administered 2019-09-05: 4 mg via INTRAVENOUS

## 2019-09-05 MED ORDER — THIAMINE HCL 100 MG/ML IJ SOLN
100.0000 mg | Freq: Every day | INTRAMUSCULAR | Status: DC
  Administered 2019-09-18: 100 mg via INTRAVENOUS
  Filled 2019-09-05 (×14): qty 1

## 2019-09-05 MED ORDER — LORAZEPAM 2 MG/ML IJ SOLN: 1.0000 mg | INTRAMUSCULAR | Status: AC | PRN

## 2019-09-05 MED ORDER — PIPERACILLIN-TAZOBACTAM 3.375 G IVPB: 3.3750 g | Freq: Three times a day (TID) | INTRAVENOUS | Status: DC

## 2019-09-05 MED ORDER — ALBUMIN HUMAN 5 % IV SOLN
INTRAVENOUS | Status: AC
  Filled 2019-09-05: qty 250

## 2019-09-05 MED ORDER — METHOCARBAMOL 500 MG IVPB - SIMPLE MED
500.0000 mg | Freq: Three times a day (TID) | INTRAVENOUS | Status: DC
  Filled 2019-09-05 (×2): qty 50

## 2019-09-05 MED ORDER — SUCCINYLCHOLINE CHLORIDE 200 MG/10ML IV SOSY
PREFILLED_SYRINGE | INTRAVENOUS | Status: AC
  Filled 2019-09-05: qty 10

## 2019-09-05 MED ORDER — ACETAMINOPHEN 10 MG/ML IV SOLN
1000.0000 mg | Freq: Four times a day (QID) | INTRAVENOUS | Status: AC
  Administered 2019-09-05 – 2019-09-06 (×4): 1000 mg via INTRAVENOUS
  Filled 2019-09-05 (×4): qty 100

## 2019-09-05 SURGICAL SUPPLY — 37 items
CHLORAPREP W/TINT 26 (MISCELLANEOUS) IMPLANT
COVER MAYO STAND STRL (DRAPES) ×4 IMPLANT
COVER SURGICAL LIGHT HANDLE (MISCELLANEOUS) ×4 IMPLANT
COVER WAND RF STERILE (DRAPES) ×4 IMPLANT
DRAIN CHANNEL 19F RND (DRAIN) IMPLANT
DRAPE LAPAROSCOPIC ABDOMINAL (DRAPES) ×4 IMPLANT
DRSG OPSITE POSTOP 4X10 (GAUZE/BANDAGES/DRESSINGS) IMPLANT
DRSG OPSITE POSTOP 4X6 (GAUZE/BANDAGES/DRESSINGS) IMPLANT
DRSG OPSITE POSTOP 4X8 (GAUZE/BANDAGES/DRESSINGS) IMPLANT
DRSG PAD ABDOMINAL 8X10 ST (GAUZE/BANDAGES/DRESSINGS) ×4 IMPLANT
ELECT REM PT RETURN 15FT ADLT (MISCELLANEOUS) ×4 IMPLANT
EVACUATOR SILICONE 100CC (DRAIN) IMPLANT
GAUZE SPONGE 4X4 12PLY STRL (GAUZE/BANDAGES/DRESSINGS) ×4 IMPLANT
GLOVE BIO SURGEON STRL SZ 6 (GLOVE) ×8 IMPLANT
GLOVE INDICATOR 6.5 STRL GRN (GLOVE) ×8 IMPLANT
GOWN STRL REUS W/TWL LRG LVL3 (GOWN DISPOSABLE) ×16 IMPLANT
GOWN STRL REUS W/TWL XL LVL3 (GOWN DISPOSABLE) ×4 IMPLANT
KIT TURNOVER KIT A (KITS) IMPLANT
PACK GENERAL/GYN (CUSTOM PROCEDURE TRAY) ×4 IMPLANT
PENCIL SMOKE EVACUATOR (MISCELLANEOUS) IMPLANT
SPONGE LAP 18X18 RF (DISPOSABLE) IMPLANT
STAPLER VISISTAT 35W (STAPLE) ×4 IMPLANT
SUT ETHILON 3 0 PS 1 (SUTURE) IMPLANT
SUT MNCRL AB 4-0 PS2 18 (SUTURE) IMPLANT
SUT NOVA T20/GS 25 (SUTURE) IMPLANT
SUT NYLON 3 0 (SUTURE) ×4 IMPLANT
SUT PDS AB 1 TP1 96 (SUTURE) ×8 IMPLANT
SUT SILK 2 0 (SUTURE) ×2
SUT SILK 2 0 SH CR/8 (SUTURE) ×4 IMPLANT
SUT SILK 2-0 18XBRD TIE 12 (SUTURE) ×2 IMPLANT
SUT SILK 3 0 (SUTURE) ×2
SUT SILK 3 0 SH CR/8 (SUTURE) ×4 IMPLANT
SUT SILK 3-0 18XBRD TIE 12 (SUTURE) ×2 IMPLANT
TAPE CLOTH SURG 6X10 WHT LF (GAUZE/BANDAGES/DRESSINGS) ×4 IMPLANT
TOWEL OR 17X26 10 PK STRL BLUE (TOWEL DISPOSABLE) IMPLANT
TOWEL OR NON WOVEN STRL DISP B (DISPOSABLE) ×4 IMPLANT
TRAY FOLEY MTR SLVR 16FR STAT (SET/KITS/TRAYS/PACK) ×4 IMPLANT

## 2019-09-05 NOTE — ED Notes (Addendum)
Pt and belongs transfered to OR. Pt given CHG bath with 6 cloths.

## 2019-09-05 NOTE — H&P (Signed)
[facility] Admission Note  Albert Love 1941/02/28  [id number].    Requesting MD: Melina Copa Chief Complaint/Reason for Consult: perforated ulcer HPI:  Patient is a 79 year old male who presented to [facility] with abdominal pain since yesterday around 3-4 pm. Patient reports pain is generalized and is severe. He denies any vomiting, but has had a little nausea.  He states that he has been having some vague crampy pain in his abdomen for several days but thought nothing of it. Has never experienced symptoms like this previously. Last BM was yesterday.  He denies use of NSAIDs, BC powder, goodys powder etc.  He does smoke 0.5ppd, 2 shots, and occasional caffeine.  Patient also reports some associated SOB secondary to abdominal pain with deep breathing. Denies fever, chills, chest pain, urinary symptoms cough, or recent exposure to anyone that has known COVID.   He presented to the ED today secondary to worsening abdominal pain.  He has a CT scan that reveals perforation of his duodenum with free air and fluid.  We have been asked to see him for further evaluation and management/   ROS: ROS: Please see HPI, otherwise all other systems reviewed and are negative.  History reviewed. No pertinent family history.  History reviewed. No pertinent past medical history.  History reviewed. No pertinent surgical history.  Social History:  reports that he has been smoking cigarettes. He has been smoking about 0.50 packs per day. He has never used smokeless tobacco. No history on file for alcohol and drug.  Allergies: No Known Allergies  (Not in a hospital admission)   Blood pressure (!) 148/97, pulse (!) 121, temperature 97.7 F (36.5 C), resp. rate 18, SpO2 97 %. Physical Exam: Physical Exam  General: pleasant, WD, WN black male who is laying in bed in NAD HEENT: head is normocephalic, atraumatic.  Sclera are noninjected.  PERRL.  Ears and nose without any masses or lesions.  Mouth is pink  and moist Heart: regular rhythm, but mildly tachy.  Normal s1,s2. No obvious murmurs, gallops, or rubs noted.  Palpable radial and pedal pulses bilaterally Lungs: CTAB, no wheezes, rhonchi, or rales noted.  Respiratory effort nonlabored Abd: soft, diffusely tender but greatest on right side of abdomen, voluntary guarding with some early peritonitic signs, ND,  Absent BS, no masses, hernias, or organomegaly MS: all 4 extremities are symmetrical with no cyanosis, clubbing, or edema. Skin: warm and dry with no masses, lesions, or rashes Psych: A&Ox3 with an appropriate affect.   Results for orders placed or performed during the hospital encounter of 09/05/19 (from the past 48 hour(s))  Comprehensive metabolic panel     Status: Abnormal   Collection Time: 09/05/19  8:28 AM  Result Value Ref Range   Sodium 137 135 - 145 mmol/L   Potassium 4.8 3.5 - 5.1 mmol/L   Chloride 101 98 - 111 mmol/L   CO2 21 (L) 22 - 32 mmol/L   Glucose, Bld 160 (H) 70 - 99 mg/dL   BUN 15 8 - 23 mg/dL   Creatinine, Ser 1.40 (H) 0.61 - 1.24 mg/dL   Calcium 9.1 8.9 - 10.3 mg/dL   Total Protein 7.4 6.5 - 8.1 g/dL   Albumin 3.8 3.5 - 5.0 g/dL   AST 29 15 - 41 U/L   ALT 17 0 - 44 U/L    Comment: RESULTS CONFIRMED BY MANUAL DILUTION   Alkaline Phosphatase 85 38 - 126 U/L   Total Bilirubin 1.1 0.3 - 1.2 mg/dL  GFR calc non Af Amer 48 (L) >60 mL/min   GFR calc Af Amer 56 (L) >60 mL/min   Anion gap 15 5 - 15    Comment: Performed at [facility], [place] [address] [place], [place] [address]  Lipase, blood     Status: None   Collection Time: 09/05/19  8:28 AM  Result Value Ref Range   Lipase 21 11 - 51 U/L    Comment: Performed at [facility], [place] [address] [place], [place] [address]  CBC with Differential     Status: Abnormal   Collection Time: 09/05/19  8:28 AM  Result Value Ref Range   WBC 4.1 4.0 - 10.5 K/uL   RBC 7.07 (H) 4.22 - 5.81 MIL/uL   Hemoglobin 18.9 (H) 13.0 -  17.0 g/dL   HCT 59.7 (H) 39.0 - 52.0 %   MCV 84.4 80.0 - 100.0 fL   MCH 26.7 26.0 - 34.0 pg   MCHC 31.7 30.0 - 36.0 g/dL   RDW 15.4 11.5 - 15.5 %   Platelets 273 150 - 400 K/uL   nRBC 0.0 0.0 - 0.2 %   Neutrophils Relative % 80 %   Neutro Abs 3.2 1.7 - 7.7 K/uL   Lymphocytes Relative 12 %   Lymphs Abs 0.5 (L) 0.7 - 4.0 K/uL   Monocytes Relative 7 %   Monocytes Absolute 0.3 0.1 - 1.0 K/uL   Eosinophils Relative 0 %   Eosinophils Absolute 0.0 0.0 - 0.5 K/uL   Basophils Relative 1 %   Basophils Absolute 0.0 0.0 - 0.1 K/uL   WBC Morphology VACUOLATED NEUTROPHILS    Immature Granulocytes 0 %   Abs Immature Granulocytes 0.01 0.00 - 0.07 K/uL    Comment: Performed at [facility], [place] [address], [place], [place] [address]  Type and screen [place]     Status: None (Preliminary result)   Collection Time: 09/05/19 10:59 AM  Result Value Ref Range   ABO/RH(D) O POS    Antibody Screen PENDING    Sample Expiration      09/08/2019,2359 Performed at [facility], [place] [address] [place], [place] [address]    CT Abdomen Pelvis W Contrast  Result Date: 09/05/2019 CLINICAL DATA:  Onset abdominal pain yesterday afternoon after eating. EXAM: CT ABDOMEN AND PELVIS WITH CONTRAST TECHNIQUE: Multidetector CT imaging of the abdomen and pelvis was performed using the standard protocol following bolus administration of intravenous contrast. CONTRAST:  169mL OMNIPAQUE IOHEXOL 300 MG/ML  SOLN COMPARISON:  None. FINDINGS: Lower chest: Small right and trace left pleural effusions. Basilar atelectasis on the right. Hepatobiliary: No focal liver abnormality is seen. No gallstones, gallbladder wall thickening, or biliary dilatation. Pancreas: Unremarkable. No pancreatic ductal dilatation or surrounding inflammatory changes. Spleen: Normal in size without focal abnormality. Adrenals/Urinary Tract: Adrenal glands are unremarkable. Kidneys are normal,  without renal calculi, focal lesion, or hydronephrosis. Bladder is unremarkable. Stomach/Bowel: There is a large volume of free intraperitoneal air and moderate free fluid. The walls of the duodenum are thickened and hyperenhancing and a focal perforation in the medial wall of the duodenum is best seen on image 16 of series 7. Abnormal enhancement and wall thickening in multiple loops of small bowel in the left abdomen is likely reactive from perforation and free fluid. The colon and appendix are unremarkable. There is no pneumatosis, portal venous gas or free intraperitoneal air. No evidence of bowel obstruction. Vascular/Lymphatic: No significant vascular findings  are present. No enlarged abdominal or pelvic lymph nodes. Reproductive: Marked prostatomegaly. Other: None. Musculoskeletal: No acute or focal abnormality. IMPRESSION: Findings most consistent with perforation of the duodenum with an associated free intraperitoneal air and fluid. Small right and trace left pleural effusions. Marked prostatomegaly. Critical Value/emergent results were called by telephone at the time of interpretation on 09/05/2019 at 10:40 am to providerMICHAEL BUTLER , who verbally acknowledged these results. Electronically Signed   By: Inge Rise M.D.   On: 09/05/2019 10:45   DG Chest Port 1 View  Result Date: 09/05/2019 CLINICAL DATA:  Abdominal pain, nausea EXAM: PORTABLE CHEST 1 VIEW COMPARISON:  None. FINDINGS: The heart size and mediastinal contours are within normal limits. Streaky opacity within the right lung base. Left lung appears clear. No pleural effusion or pneumothorax. No acute osseous findings. IMPRESSION: Streaky opacity within the right lung base which may reflect atelectasis or pneumonia. Electronically Signed   By: Davina Poke D.O.   On: 09/05/2019 08:57      Assessment/Plan Acute renal insufficiency - cr 1.40, likely secondary to dehydration.  Check BMET in am Elevated BP - could have underlying  HTN, but may be secondary to pain.  Will follow.  Pneumoperitoneum, likely secondary to perforated duodenum, etiology unclear but mostly likely ulcer - to OR for exploration - COVID test pending   - admit to inpatient post-operatively  -cont zosyn and add eraxis for possible fungal coverage given upper GI source -discussed the procedure with the patient including expected outcomes.  We also discussed risks and complications such as bleeding, infection, leak as repair site, incisional open, open wound, need for further surgery, post op abscess, anesthesia complications such as MI, CVA, or death.  He understands and is agreeable to proceed.  FEN: NPO, IVFs VTE: will start post op ID: [email or web address], Eraxis  Henreitta Cea 11:51 AM 09/05/2019

## 2019-09-05 NOTE — ED Provider Notes (Signed)
[facility] DEPT Provider Note   CSN: [id number] Arrival date & time: 09/05/19  0815     History Chief Complaint  Patient presents with  . Abdominal Pain  . Nausea    Albert Love is a 79 y.o. male.  He is complaining of generalized abdominal pain that started yesterday.  Severe in nature.  Associated with nausea and dry heaves.  Last bowel movement was yesterday.  No urinary symptoms.  Short of breath as breathing deeply makes his abdomen hurt.  No chest pain.  No fevers or chills.  Denies ever having these symptoms before.  The history is provided by the patient.  Abdominal Pain Pain location:  Generalized Pain quality: aching   Pain radiates to:  Does not radiate Pain severity:  Severe Onset quality:  Gradual Duration:  2 days Timing:  Constant Progression:  Unchanged Chronicity:  New Context: retching   Context: not recent illness, not recent travel, not sick contacts and not trauma   Relieved by:  None tried Worsened by:  Nothing Ineffective treatments:  None tried Associated symptoms: constipation (?), nausea, shortness of breath and vomiting   Associated symptoms: no chest pain, no cough, no diarrhea, no dysuria, no fever, no hematemesis and no sore throat        History reviewed. No pertinent past medical history.  There are no problems to display for this patient.   History reviewed. No pertinent surgical history.     History reviewed. No pertinent family history.  Social History   Tobacco Use  . Smoking status: Current Every Day Smoker    Packs/day: 0.50    Types: Cigarettes  . Smokeless tobacco: Never Used  Substance Use Topics  . Alcohol use: Not on file  . Drug use: Not on file    Home Medications Prior to Admission medications   Not on File    Allergies    Patient has no known allergies.  Review of Systems   Review of Systems  Constitutional: Negative for fever.  HENT: Negative for sore throat.    Eyes: Negative for visual disturbance.  Respiratory: Positive for shortness of breath. Negative for cough.   Cardiovascular: Negative for chest pain.  Gastrointestinal: Positive for abdominal pain, constipation (?), nausea and vomiting. Negative for diarrhea and hematemesis.  Genitourinary: Negative for dysuria.  Musculoskeletal: Negative for back pain.  Skin: Negative for rash.  Neurological: Negative for numbness.    Physical Exam Updated Vital Signs BP (!) 147/105   Pulse (!) 135   Temp 97.7 F (36.5 C)   Resp 20   SpO2 95%   Physical Exam Vitals and nursing note reviewed.  Constitutional:      Appearance: He is well-developed.  HENT:     Head: Normocephalic and atraumatic.  Eyes:     Conjunctiva/sclera: Conjunctivae normal.  Cardiovascular:     Rate and Rhythm: Regular rhythm. Tachycardia present.     Heart sounds: No murmur.  Pulmonary:     Effort: Pulmonary effort is normal. No respiratory distress.     Breath sounds: Normal breath sounds.  Abdominal:     General: Abdomen is flat.     Palpations: Abdomen is soft.     Tenderness: There is generalized abdominal tenderness. There is guarding and rebound.     Hernia: No hernia is present.  Musculoskeletal:        General: No deformity or signs of injury. Normal range of motion.     Cervical back: Neck supple.  Skin:    General: Skin is warm and dry.     Capillary Refill: Capillary refill takes less than 2 seconds.  Neurological:     General: No focal deficit present.     Mental Status: He is alert.     ED Results / Procedures / Treatments   Labs (all labs ordered are listed, but only abnormal results are displayed) Labs Reviewed  COMPREHENSIVE METABOLIC PANEL - Abnormal; Notable for the following components:      Result Value   CO2 21 (*)    Glucose, Bld 160 (*)    Creatinine, Ser 1.40 (*)    GFR calc non Af Amer 48 (*)    GFR calc Af Amer 56 (*)    All other components within normal limits  CBC WITH  DIFFERENTIAL/PLATELET - Abnormal; Notable for the following components:   RBC 7.07 (*)    Hemoglobin 18.9 (*)    HCT 59.7 (*)    Lymphs Abs 0.5 (*)    All other components within normal limits  APTT - Abnormal; Notable for the following components:   aPTT 78 (*)    All other components within normal limits  PROTIME-INR - Abnormal; Notable for the following components:   Prothrombin Time 44.9 (*)    INR 4.8 (*)    All other components within normal limits  PROTIME-INR - Abnormal; Notable for the following components:   Prothrombin Time 19.1 (*)    INR 1.6 (*)    All other components within normal limits  RESPIRATORY PANEL BY RT PCR (FLU A&B, COVID)  LIPASE, BLOOD  TYPE AND SCREEN  ABO/RH  PREPARE FRESH FROZEN PLASMA  SURGICAL PATHOLOGY    EKG EKG Interpretation  Date/Time:  Thursday September 05 2019 08:26:59 EST Ventricular Rate:  134 PR Interval:    QRS Duration: 89 QT Interval:  279 QTC Calculation: 417 R Axis:   51 Text Interpretation: Sinus tachycardia Nonspecific T abnormalities, lateral leads No old tracing to compare Confirmed by Aletta Edouard [phone]) on 09/05/2019 8:45:58 AM   Radiology CT Abdomen Pelvis W Contrast  Result Date: 09/05/2019 CLINICAL DATA:  Onset abdominal pain yesterday afternoon after eating. EXAM: CT ABDOMEN AND PELVIS WITH CONTRAST TECHNIQUE: Multidetector CT imaging of the abdomen and pelvis was performed using the standard protocol following bolus administration of intravenous contrast. CONTRAST:  127mL OMNIPAQUE IOHEXOL 300 MG/ML  SOLN COMPARISON:  None. FINDINGS: Lower chest: Small right and trace left pleural effusions. Basilar atelectasis on the right. Hepatobiliary: No focal liver abnormality is seen. No gallstones, gallbladder wall thickening, or biliary dilatation. Pancreas: Unremarkable. No pancreatic ductal dilatation or surrounding inflammatory changes. Spleen: Normal in size without focal abnormality. Adrenals/Urinary Tract: Adrenal  glands are unremarkable. Kidneys are normal, without renal calculi, focal lesion, or hydronephrosis. Bladder is unremarkable. Stomach/Bowel: There is a large volume of free intraperitoneal air and moderate free fluid. The walls of the duodenum are thickened and hyperenhancing and a focal perforation in the medial wall of the duodenum is best seen on image 16 of series 7. Abnormal enhancement and wall thickening in multiple loops of small bowel in the left abdomen is likely reactive from perforation and free fluid. The colon and appendix are unremarkable. There is no pneumatosis, portal venous gas or free intraperitoneal air. No evidence of bowel obstruction. Vascular/Lymphatic: No significant vascular findings are present. No enlarged abdominal or pelvic lymph nodes. Reproductive: Marked prostatomegaly. Other: None. Musculoskeletal: No acute or focal abnormality. IMPRESSION: Findings most consistent with perforation of the  duodenum with an associated free intraperitoneal air and fluid. Small right and trace left pleural effusions. Marked prostatomegaly. Critical Value/emergent results were called by telephone at the time of interpretation on 09/05/2019 at 10:40 am to providerMICHAEL Karsen Nakanishi , who verbally acknowledged these results. Electronically Signed   By: Inge Rise M.D.   On: 09/05/2019 10:45   DG Chest Port 1 View  Result Date: 09/05/2019 CLINICAL DATA:  Abdominal pain, nausea EXAM: PORTABLE CHEST 1 VIEW COMPARISON:  None. FINDINGS: The heart size and mediastinal contours are within normal limits. Streaky opacity within the right lung base. Left lung appears clear. No pleural effusion or pneumothorax. No acute osseous findings. IMPRESSION: Streaky opacity within the right lung base which may reflect atelectasis or pneumonia. Electronically Signed   By: Davina Poke D.O.   On: 09/05/2019 08:57    Procedures .Critical Care Performed by: Hayden Rasmussen, MD Authorized by: Hayden Rasmussen,  MD   Critical care provider statement:    Critical care time (minutes):  45   Critical care time was exclusive of:  Separately billable procedures and treating other patients   Critical care was necessary to treat or prevent imminent or life-threatening deterioration of the following conditions: surgical emergency.   Critical care was time spent personally by me on the following activities:  Discussions with consultants, evaluation of patient's response to treatment, examination of patient, ordering and performing treatments and interventions, ordering and review of laboratory studies, ordering and review of radiographic studies, pulse oximetry, re-evaluation of patient's condition, obtaining history from patient or surrogate, review of old charts and development of treatment plan with patient or surrogate   I assumed direction of critical care for this patient from another provider in my specialty: no     (including critical care time)  Medications Ordered in ED Medications  sodium chloride (PF) 0.9 % injection (has no administration in time range)  0.9 %  sodium chloride infusion (has no administration in time range)  HYDROmorphone (DILAUDID) injection 1 mg (has no administration in time range)  ondansetron (ZOFRAN) injection 4 mg (has no administration in time range)  anidulafungin (ERAXIS) 200 mg in sodium chloride 0.9 % 200 mL IVPB (has no administration in time range)  piperacillin-tazobactam (ZOSYN) IVPB 3.375 g (has no administration in time range)  metoprolol tartrate (LOPRESSOR) injection 5 mg (has no administration in time range)  HYDROmorphone (DILAUDID) injection 1 mg (1 mg Intravenous Given 09/05/19 0841)  ondansetron (ZOFRAN) injection 4 mg (4 mg Intravenous Given 09/05/19 0841)  sodium chloride 0.9 % bolus 1,000 mL (0 mLs Intravenous Stopped 09/05/19 1035)  iohexol (OMNIPAQUE) 300 MG/ML solution 100 mL (100 mLs Intravenous Contrast Given 09/05/19 1011)  piperacillin-tazobactam  (ZOSYN) IVPB 3.375 g (0 g Intravenous Stopped 09/05/19 1124)  HYDROmorphone (DILAUDID) injection 0.5 mg (0.5 mg Intravenous Given 09/05/19 1119)    ED Course  I have reviewed the triage vital signs and the nursing notes.  Pertinent labs & imaging results that were available during my care of the patient were reviewed by me and considered in my medical decision making (see chart for details).  Clinical Course as of Sep 04 1629  Thu Sep 04, 4925  2117 79 year old male here with acute onset of abdominal pain. Some signs of peritonitis on exam. Bedside ultrasound did not demonstrate a grossly enlarged aorta. Getting upright chest x-ray. Concern for perforation, diverticulitis, constipation, obstruction, appendicitis   [MB]  23 Received a call from radiology that the patient has a duodenal perforation.  I have ordered him Zosyn placed a consult into general surgery and added on additional labs and Covid testing.  I updated the patient on current status.   [MB]  [id number] Surgery has evaluated the patient and they are taking to the operating room.  I have added on some other labs.  Strangely his INR came back at 4.8.  Surgery aware and ordering FFP, repeating INR.   [MB]    Clinical Course User Index [MB] Hayden Rasmussen, MD   MDM Rules/Calculators/A&P                      Final Clinical Impression(s) / ED Diagnoses Final diagnoses:  Perforated abdominal viscus    Rx / DC Orders ED Discharge Orders    None       Hayden Rasmussen, MD 09/05/19 [phone]

## 2019-09-05 NOTE — ED Notes (Signed)
Kae Heller Md at bedside. Aware of Critical INR value 4.8

## 2019-09-05 NOTE — Transfer of Care (Signed)
Immediate Anesthesia Transfer of Care Note  Patient: [facility]  Procedure(s) Performed: EXPLORATORY LAPAROTOMY WITH [facility] AND GASTRIC BIOPSY (N/A Abdomen) SMALL BOWEL RESECTION (N/A )  Patient Location: PACU  Anesthesia Type:General  Level of Consciousness: awake and responds to stimulation  Airway & Oxygen Therapy: Patient Spontanous Breathing and Patient connected to face mask oxygen  Post-op Assessment: Report given to RN and Post -op Vital signs reviewed and stable  Post vital signs: Reviewed and stable  Last Vitals:  Vitals Value Taken Time  BP 123/90 09/05/19 1510  Temp    Pulse 93 09/05/19 1511  Resp 14 09/05/19 1511  SpO2 100 % 09/05/19 1511  Vitals shown include unvalidated device data.  Last Pain:  Vitals:   09/05/19 1314  TempSrc:   PainSc: 0-No pain         Complications: No apparent anesthesia complications

## 2019-09-05 NOTE — Anesthesia Postprocedure Evaluation (Signed)
Anesthesia Post Note  Patient: [name]  Procedure(s) Performed: EXPLORATORY LAPAROTOMY WITH [facility] AND GASTRIC BIOPSY (N/A Abdomen) SMALL BOWEL RESECTION (N/A )     Patient location during evaluation: PACU Anesthesia Type: General Level of consciousness: awake and alert Pain management: pain level controlled Vital Signs Assessment: post-procedure vital signs reviewed and stable Respiratory status: spontaneous breathing, nonlabored ventilation, respiratory function stable and patient connected to nasal cannula oxygen Cardiovascular status: blood pressure returned to baseline and stable Postop Assessment: no apparent nausea or vomiting Anesthetic complications: no    Last Vitals:  Vitals:   09/05/19 1306 09/05/19 1502  BP: (!) 142/104 (!) 125/109  Pulse: 86 79  Resp: 18 (!) 23  Temp: 36.5 C 36.8 C  SpO2: 98% (!) 81%    Last Pain:  Vitals:   09/05/19 1502  TempSrc:   PainSc: 5                  Conita Amenta

## 2019-09-05 NOTE — Op Note (Addendum)
Operative Note  Albert Love  [id number]  [id number]  09/05/2019   Surgeon: Vikki Ports A ConnorMD  Assistant: Brigid Re PA-C  Procedure performed: exploratory laparotomy, gastric biopsy, omental pedicle flap [name] patch) repair of perforated pyloric ulcer  Procedure status: EMERGENT  Infection Present at Time of Surgery: YES, diffuse purulent peritonitis and succus throughout the abdomen  Preop diagnosis: perforated viscus  Post-op diagnosis/intraop findings: perforated ulcer at or just proximal to pylorus, with adjacent firmness concerning for mass vs inflammation, widespread bilious succus throughout the abdomen, diffuse purulent peritonitis  Specimens: gastric biopsy Retained items: 70fr round blake drain in right upper quadrant along graham patch repair EBL: minimal cc Complications: none  Description of procedure: After obtaining informed consent the patient was taken to the operating room and placed supine on operating room table wheregeneral endotracheal anesthesia was initiated, preoperative antibiotics were administered, SCDs applied, and a formal timeout was performed.  Foley catheter and nasogastric tubes were inserted.  The abdomen was prepped and draped in usual sterile fashion.  A midline laparotomy was created and upon entry about 300 cc of bilious GI contents were encountered and aspirated.  The perforation was identified at or just proximal to the pylorus, about 8 mm diameter with clean edges and palpable fullness in the surrounding gastric tissue concerning for either inflammatory induration or mass.  We inspected the rest of the abdomen.  The liver appeared normal in size and contour and there were no palpable or visible lesions.  The visualized parts of the colon were normal.  The small bowel was run from the ligament of Treitz to the ileocecal valve and no abnormalities were identified, purulent exudate was debrided from the bowel and mesentery as much as  possible.  The mesenteric vasculature appeared normal with no evidence of portal hypertension.  There is no palpable mass in the duodenum or remainder of the stomach.  The gallbladder appears secondarily inflamed.  The edge of the perforated ulcer was biopsied to rule out malignancy.  Hemostasis was ensured with cautery.  An omental pedicle flap was created and brought up to cover the perforation.  4 interrupted 3-0 silk sutures were placed across the perforation, the omental pedicle flap was then tied down and secured in place.  A 19 French round Blake drain was introduced through the right midabdomen and directed up along the [facility] and right upper quadrant.  This is secured the skin with a 3-0 nylon.  The abdomen was then irrigated with 4 L of warm sterile saline, all quadrants were lavaged and the effluent was clear.  The NG tube was palpated in the stomach in good position and was secured by the anesthetist.  The abdomen was inspected for hemostasis which was found to be excellent.  The incision was closed with running looped #1 PDS starting at either end and tying centrally.  A wet-to-dry dressing was placed in the wound and the drain was placed to bulb suction.  The patient was then awakened, extubated and taken to PACU in stable condition.   All counts were correct at the completion of the case.     I updated his son, Albert Love, by phone at the end of the case.

## 2019-09-05 NOTE — ED Triage Notes (Signed)
EMS reports from home, generalized abdominal pain and nausea starting yesterday afternoon after eating.   BP 136/100 HR 100 RR 16 Sp02 94 Temp 98.0

## 2019-09-05 NOTE — Progress Notes (Addendum)
Patient's HR increased from between 150s and 180 for approximately 45 seconds. Went to assess patient and he is was resting comfortably in bed. Patient HR now staying in the 90s. Paged Dr. Johney Maine. New orders placed in Epic. Will continue to monitor.

## 2019-09-05 NOTE — Anesthesia Procedure Notes (Signed)
Procedure Name: Intubation Date/Time: 09/05/2019 1:43 PM Performed by: Garrel Ridgel, CRNA Pre-anesthesia Checklist: Patient identified, Emergency Drugs available, Suction available, Patient being monitored and Timeout performed Patient Re-evaluated:Patient Re-evaluated prior to induction Oxygen Delivery Method: Circle system utilized Preoxygenation: Pre-oxygenation with 100% oxygen Induction Type: IV induction, Rapid sequence and Cricoid Pressure applied Ventilation: Mask ventilation without difficulty Laryngoscope Size: Mac and 4 Grade View: Grade I Tube type: Oral Tube size: 7.5 mm Number of attempts: 1 Airway Equipment and Method: Stylet and Oral airway Placement Confirmation: ETT inserted through vocal cords under direct vision,  positive ETCO2 and breath sounds checked- equal and bilateral Secured at: 23 cm Tube secured with: Tape Dental Injury: Teeth and Oropharynx as per pre-operative assessment

## 2019-09-05 NOTE — Consult Note (Deleted)
[facility] Admission Note  Albert Love [birth year]  [id number].    Requesting MD: Melina Copa Chief Complaint/Reason for Consult: perforated ulcer HPI:  Patient is a 79 year old male who presented to [facility] with abdominal pain since yesterday around 3-4 pm. Patient reports pain is generalized and is severe. He denies any vomiting, but has had a little nausea.  He states that he has been having some vague crampy pain in his abdomen for several days but thought nothing of it. Has never experienced symptoms like this previously. Last BM was yesterday.  He denies use of NSAIDs, BC powder, goodys powder etc.  He does smoke 0.5ppd, 2 shots, and occasional caffeine.  Patient also reports some associated SOB secondary to abdominal pain with deep breathing. Denies fever, chills, chest pain, urinary symptoms cough, or recent exposure to anyone that has known COVID.   He presented to the ED today secondary to worsening abdominal pain.  He has a CT scan that reveals perforation of his duodenum with free air and fluid.  We have been asked to see him for further evaluation and management/   ROS: ROS: Please see HPI, otherwise all other systems reviewed and are negative.  History reviewed. No pertinent family history.  History reviewed. No pertinent past medical history.  History reviewed. No pertinent surgical history.  Social History:  reports that he has been smoking cigarettes. He has been smoking about 0.50 packs per day. He has never used smokeless tobacco. No history on file for alcohol and drug.  Allergies: No Known Allergies  (Not in a hospital admission)   Blood pressure (!) 148/97, pulse (!) 121, temperature 97.7 F (36.5 C), resp. rate 18, SpO2 97 %. Physical Exam: Physical Exam  General: pleasant, WD, WN black male who is laying in bed in NAD HEENT: head is normocephalic, atraumatic.  Sclera are noninjected.  PERRL.  Ears and nose without any masses or lesions.  Mouth is pink  and moist Heart: regular rhythm, but mildly tachy.  Normal s1,s2. No obvious murmurs, gallops, or rubs noted.  Palpable radial and pedal pulses bilaterally Lungs: CTAB, no wheezes, rhonchi, or rales noted.  Respiratory effort nonlabored Abd: soft, diffusely tender but greatest on right side of abdomen, voluntary guarding with some early peritonitic signs, ND,  Absent BS, no masses, hernias, or organomegaly MS: all 4 extremities are symmetrical with no cyanosis, clubbing, or edema. Skin: warm and dry with no masses, lesions, or rashes Psych: A&Ox3 with an appropriate affect.   Results for orders placed or performed during the hospital encounter of 09/05/19 (from the past 48 hour(s))  Comprehensive metabolic panel     Status: Abnormal   Collection Time: 09/05/19  8:28 AM  Result Value Ref Range   Sodium 137 135 - 145 mmol/L   Potassium 4.8 3.5 - 5.1 mmol/L   Chloride 101 98 - 111 mmol/L   CO2 21 (L) 22 - 32 mmol/L   Glucose, Bld 160 (H) 70 - 99 mg/dL   BUN 15 8 - 23 mg/dL   Creatinine, Ser 1.40 (H) 0.61 - 1.24 mg/dL   Calcium 9.1 8.9 - 10.3 mg/dL   Total Protein 7.4 6.5 - 8.1 g/dL   Albumin 3.8 3.5 - 5.0 g/dL   AST 29 15 - 41 U/L   ALT 17 0 - 44 U/L    Comment: RESULTS CONFIRMED BY MANUAL DILUTION   Alkaline Phosphatase 85 38 - 126 U/L   Total Bilirubin 1.1 0.3 - 1.2 mg/dL  GFR calc non Af Amer 48 (L) >60 mL/min   GFR calc Af Amer 56 (L) >60 mL/min   Anion gap 15 5 - 15    Comment: Performed at [facility], [place] [address], [place], [place] [address]  Lipase, blood     Status: None   Collection Time: 09/05/19  8:28 AM  Result Value Ref Range   Lipase 21 11 - 51 U/L    Comment: Performed at [facility], [place] [address], [place], [place] [address]  CBC with Differential     Status: Abnormal   Collection Time: 09/05/19  8:28 AM  Result Value Ref Range   WBC 4.1 4.0 - 10.5 K/uL   RBC 7.07 (H) 4.22 - 5.81 MIL/uL   Hemoglobin 18.9 (H) 13.0 -  17.0 g/dL   HCT 59.7 (H) 39.0 - 52.0 %   MCV 84.4 80.0 - 100.0 fL   MCH 26.7 26.0 - 34.0 pg   MCHC 31.7 30.0 - 36.0 g/dL   RDW 15.4 11.5 - 15.5 %   Platelets 273 150 - 400 K/uL   nRBC 0.0 0.0 - 0.2 %   Neutrophils Relative % 80 %   Neutro Abs 3.2 1.7 - 7.7 K/uL   Lymphocytes Relative 12 %   Lymphs Abs 0.5 (L) 0.7 - 4.0 K/uL   Monocytes Relative 7 %   Monocytes Absolute 0.3 0.1 - 1.0 K/uL   Eosinophils Relative 0 %   Eosinophils Absolute 0.0 0.0 - 0.5 K/uL   Basophils Relative 1 %   Basophils Absolute 0.0 0.0 - 0.1 K/uL   WBC Morphology VACUOLATED NEUTROPHILS    Immature Granulocytes 0 %   Abs Immature Granulocytes 0.01 0.00 - 0.07 K/uL    Comment: Performed at [facility], [place] [address], [place], [place] [address]  Type and screen Levasy     Status: None (Preliminary result)   Collection Time: 09/05/19 10:59 AM  Result Value Ref Range   ABO/RH(D) O POS    Antibody Screen PENDING    Sample Expiration      09/08/2019,2359 Performed at [facility], [place] [address] [place], [place] [address]    CT Abdomen Pelvis W Contrast  Result Date: 09/05/2019 CLINICAL DATA:  Onset abdominal pain yesterday afternoon after eating. EXAM: CT ABDOMEN AND PELVIS WITH CONTRAST TECHNIQUE: Multidetector CT imaging of the abdomen and pelvis was performed using the standard protocol following bolus administration of intravenous contrast. CONTRAST:  117mL OMNIPAQUE IOHEXOL 300 MG/ML  SOLN COMPARISON:  None. FINDINGS: Lower chest: Small right and trace left pleural effusions. Basilar atelectasis on the right. Hepatobiliary: No focal liver abnormality is seen. No gallstones, gallbladder wall thickening, or biliary dilatation. Pancreas: Unremarkable. No pancreatic ductal dilatation or surrounding inflammatory changes. Spleen: Normal in size without focal abnormality. Adrenals/Urinary Tract: Adrenal glands are unremarkable. Kidneys are normal,  without renal calculi, focal lesion, or hydronephrosis. Bladder is unremarkable. Stomach/Bowel: There is a large volume of free intraperitoneal air and moderate free fluid. The walls of the duodenum are thickened and hyperenhancing and a focal perforation in the medial wall of the duodenum is best seen on image 16 of series 7. Abnormal enhancement and wall thickening in multiple loops of small bowel in the left abdomen is likely reactive from perforation and free fluid. The colon and appendix are unremarkable. There is no pneumatosis, portal venous gas or free intraperitoneal air. No evidence of bowel obstruction. Vascular/Lymphatic: No significant vascular findings  are present. No enlarged abdominal or pelvic lymph nodes. Reproductive: Marked prostatomegaly. Other: None. Musculoskeletal: No acute or focal abnormality. IMPRESSION: Findings most consistent with perforation of the duodenum with an associated free intraperitoneal air and fluid. Small right and trace left pleural effusions. Marked prostatomegaly. Critical Value/emergent results were called by telephone at the time of interpretation on 09/05/2019 at 10:40 am to providerMICHAEL BUTLER , who verbally acknowledged these results. Electronically Signed   By: Inge Rise M.D.   On: 09/05/2019 10:45   DG Chest Port 1 View  Result Date: 09/05/2019 CLINICAL DATA:  Abdominal pain, nausea EXAM: PORTABLE CHEST 1 VIEW COMPARISON:  None. FINDINGS: The heart size and mediastinal contours are within normal limits. Streaky opacity within the right lung base. Left lung appears clear. No pleural effusion or pneumothorax. No acute osseous findings. IMPRESSION: Streaky opacity within the right lung base which may reflect atelectasis or pneumonia. Electronically Signed   By: Davina Poke D.O.   On: 09/05/2019 08:57      Assessment/Plan Acute renal insufficiency - cr 1.40, likely secondary to dehydration.  Check BMET in am Elevated BP - could have underlying  HTN, but may be secondary to pain.  Will follow.  Pneumoperitoneum, likely secondary to perforated duodenum, etiology unclear but mostly likely ulcer - to OR for exploration - COVID test pending   - admit to inpatient post-operatively  -cont zosyn and add eraxis for possible fungal coverage given upper GI source -discussed the procedure with the patient including expected outcomes.  We also discussed risks and complications such as bleeding, infection, leak as repair site, incisional open, open wound, need for further surgery, post op abscess, anesthesia complications such as MI, CVA, or death.  He understands and is agreeable to proceed.  FEN: NPO, IVFs VTE: will start post op ID: [email or web address], Eraxis  Henreitta Cea 11:51 AM 09/05/2019

## 2019-09-05 NOTE — Anesthesia Preprocedure Evaluation (Addendum)
Anesthesia Evaluation  Patient identified by MRN, date of birth, ID band Patient awake    Reviewed: Allergy & Precautions, H&P , NPO status , Patient's Chart, lab work & pertinent test results, reviewed documented beta blocker date and time   Airway Mallampati: I  TM Distance: >3 FB Neck ROM: full    Dental no notable dental hx. (+) Edentulous Upper, Edentulous Lower   Pulmonary neg pulmonary ROS, Current Smoker and Patient abstained from smoking.,    Pulmonary exam normal breath sounds clear to auscultation       Cardiovascular Exercise Tolerance: Good negative cardio ROS   Rhythm:regular Rate:Tachycardia  ECG: ST, rate 134   Neuro/Psych negative neurological ROS  negative psych ROS   GI/Hepatic negative GI ROS, Neg liver ROS,   Endo/Other  negative endocrine ROS  Renal/GU negative Renal ROS  negative genitourinary   Musculoskeletal negative musculoskeletal ROS (+)   Abdominal   Peds negative pediatric ROS (+)  Hematology negative hematology ROS (+)   Anesthesia Other Findings PERFORATED ULCER  Reproductive/Obstetrics negative OB ROS                           Anesthesia Physical Anesthesia Plan  ASA: III and emergent  Anesthesia Plan: General   Post-op Pain Management:    Induction: Intravenous and Cricoid pressure planned  PONV Risk Score and Plan: 2 and Ondansetron, Dexamethasone and Treatment may vary due to age or medical condition  Airway Management Planned: Oral ETT  Additional Equipment:   Intra-op Plan:   Post-operative Plan: Extubation in OR  Informed Consent: I have reviewed the patients History and Physical, chart, labs and discussed the procedure including the risks, benefits and alternatives for the proposed anesthesia with the patient or authorized representative who has indicated his/her understanding and acceptance.     Dental Advisory Given  Plan  Discussed with: CRNA, Anesthesiologist and Surgeon  Anesthesia Plan Comments:        Anesthesia Quick Evaluation

## 2019-09-06 LAB — COMPREHENSIVE METABOLIC PANEL
ALT: 16 U/L (ref 0–44)
AST: 22 U/L (ref 15–41)
Albumin: 2.7 g/dL — ABNORMAL LOW (ref 3.5–5.0)
Alkaline Phosphatase: 48 U/L (ref 38–126)
Anion gap: 9 (ref 5–15)
BUN: 14 mg/dL (ref 8–23)
CO2: 24 mmol/L (ref 22–32)
Calcium: 7.6 mg/dL — ABNORMAL LOW (ref 8.9–10.3)
Chloride: 106 mmol/L (ref 98–111)
Creatinine, Ser: 1.08 mg/dL (ref 0.61–1.24)
GFR calc Af Amer: >60 mL/min (ref >60)
GFR calc non Af Amer: >60 mL/min (ref >60)
Glucose, Bld: 133 mg/dL — ABNORMAL HIGH (ref 70–99)
Potassium: 4.8 mmol/L (ref 3.5–5.1)
Sodium: 139 mmol/L (ref 135–145)
Total Bilirubin: 0.9 mg/dL (ref 0.3–1.2)
Total Protein: 5.1 g/dL — ABNORMAL LOW (ref 6.5–8.1)

## 2019-09-06 LAB — CBC
HCT: 46.9 % (ref 39.0–52.0)
Hemoglobin: 14.5 g/dL (ref 13.0–17.0)
MCH: 26.7 pg (ref 26.0–34.0)
MCHC: 30.9 g/dL (ref 30.0–36.0)
MCV: 86.2 fL (ref 80.0–100.0)
Platelets: 190 10*3/uL (ref 150–400)
RBC: 5.44 MIL/uL (ref 4.22–5.81)
RDW: 14.3 % (ref 11.5–15.5)
WBC: 9.8 10*3/uL (ref 4.0–10.5)
nRBC: 0 % (ref 0.0–0.2)

## 2019-09-06 LAB — PROTIME-INR
INR: 1.7 — ABNORMAL HIGH (ref 0.8–1.2)
Prothrombin Time: 19.8 seconds — ABNORMAL HIGH (ref 11.4–15.2)

## 2019-09-06 LAB — MAGNESIUM: Magnesium: 1.5 mg/dL — ABNORMAL LOW (ref 1.7–2.4)

## 2019-09-06 LAB — SURGICAL PATHOLOGY

## 2019-09-06 LAB — PHOSPHORUS: Phosphorus: 4 mg/dL (ref 2.5–4.6)

## 2019-09-06 MED ORDER — ENOXAPARIN SODIUM 40 MG/0.4ML ~~LOC~~ SOLN
40.0000 mg | SUBCUTANEOUS | Status: DC
  Administered 2019-09-06 – 2019-09-13 (×8): 40 mg via SUBCUTANEOUS
  Filled 2019-09-06 (×8): qty 0.4

## 2019-09-06 MED ORDER — MAGNESIUM SULFATE 4 GM/100ML IV SOLN
4.0000 g | Freq: Once | INTRAVENOUS | Status: AC
  Administered 2019-09-06: 4 g via INTRAVENOUS
  Filled 2019-09-06: qty 100

## 2019-09-06 MED ORDER — HYDROMORPHONE HCL 1 MG/ML IJ SOLN
0.5000 mg | INTRAMUSCULAR | Status: DC | PRN
  Administered 2019-09-06: 1 mg via INTRAVENOUS
  Administered 2019-09-06: 0.5 mg via INTRAVENOUS
  Administered 2019-09-07 – 2019-09-09 (×12): 1 mg via INTRAVENOUS
  Administered 2019-09-10: 0.5 mg via INTRAVENOUS
  Administered 2019-09-10: 1 mg via INTRAVENOUS
  Administered 2019-09-11: 0.5 mg via INTRAVENOUS
  Filled 2019-09-06 (×18): qty 1

## 2019-09-06 NOTE — Progress Notes (Signed)
Patient ID: Albert Love, male   DOB: Dec 24, 1940, 79 y.o.   MRN: [id number]    1 Day Post-Op  Subjective: Feels better than pre-op.  Pain well controlled.  Slept well overnight.  No other issues.  ROS: See above, otherwise other systems negative  Objective: Vital signs in last 24 hours: Temp:  [97.5 F (36.4 C)-98.3 F (36.8 C)] 98.3 F (36.8 C) (01/22 0335) Pulse Rate:  [79-135] 93 (01/22 0600) Resp:  [11-24] 19 (01/22 0600) BP: (124-180)/(66-116) 145/76 (01/22 0600) SpO2:  [81 %-100 %] 95 % (01/22 0600) Weight:  [79.4 kg] 79.4 kg (01/21 1306) Last BM Date: 09/03/19  Intake/Output from previous day: 01/21 0701 - 01/22 0700 In: 8441.8 [I.V.:5181.3; IV Piggyback:2910.5] Out: [id number] [Urine:1600; Emesis/NG output:375; Drains:380; Blood:20] Intake/Output this shift: No intake/output data recorded.  PE: Heart: regular Lungs: few rhonchi noted bilaterally, pulls about 500 on IS Abd: soft, appropriately tender, NGT in place with minimal bilious output, midline wound is open and clean, JP drain with serosang output. GU: foley in place with clear yellow urine  Lab Results:  Recent Labs    09/05/19 0828 09/06/19 0002  WBC 4.1 9.8  HGB 18.9* 14.5  HCT 59.7* 46.9  PLT 273 190   BMET Recent Labs    09/05/19 0828 09/06/19 0002  NA 137 139  K 4.8 4.8  CL 101 106  CO2 21* 24  GLUCOSE 160* 133*  BUN 15 14  CREATININE 1.40* 1.08  CALCIUM 9.1 7.6*   PT/INR Recent Labs    09/05/19 1531 09/06/19 0002  LABPROT 19.1* 19.8*  INR 1.6* 1.7*   CMP     Component Value Date/Time   NA 139 09/06/2019 0002   K 4.8 09/06/2019 0002   CL 106 09/06/2019 0002   CO2 24 09/06/2019 0002   GLUCOSE 133 (H) 09/06/2019 0002   BUN 14 09/06/2019 0002   CREATININE 1.08 09/06/2019 0002   CALCIUM 7.6 (L) 09/06/2019 0002   PROT 5.1 (L) 09/06/2019 0002   ALBUMIN 2.7 (L) 09/06/2019 0002   AST 22 09/06/2019 0002   ALT 16 09/06/2019 0002   ALKPHOS 48 09/06/2019 0002   BILITOT 0.9  09/06/2019 0002   GFRNONAA >60 09/06/2019 0002   GFRAA >60 09/06/2019 0002   Lipase     Component Value Date/Time   LIPASE 21 09/05/2019 0828       Studies/Results: CT Abdomen Pelvis W Contrast  Result Date: 09/05/2019 CLINICAL DATA:  Onset abdominal pain yesterday afternoon after eating. EXAM: CT ABDOMEN AND PELVIS WITH CONTRAST TECHNIQUE: Multidetector CT imaging of the abdomen and pelvis was performed using the standard protocol following bolus administration of intravenous contrast. CONTRAST:  136mL OMNIPAQUE IOHEXOL 300 MG/ML  SOLN COMPARISON:  None. FINDINGS: Lower chest: Small right and trace left pleural effusions. Basilar atelectasis on the right. Hepatobiliary: No focal liver abnormality is seen. No gallstones, gallbladder wall thickening, or biliary dilatation. Pancreas: Unremarkable. No pancreatic ductal dilatation or surrounding inflammatory changes. Spleen: Normal in size without focal abnormality. Adrenals/Urinary Tract: Adrenal glands are unremarkable. Kidneys are normal, without renal calculi, focal lesion, or hydronephrosis. Bladder is unremarkable. Stomach/Bowel: There is a large volume of free intraperitoneal air and moderate free fluid. The walls of the duodenum are thickened and hyperenhancing and a focal perforation in the medial wall of the duodenum is best seen on image 16 of series 7. Abnormal enhancement and wall thickening in multiple loops of small bowel in the left abdomen is likely reactive from perforation and free fluid.  The colon and appendix are unremarkable. There is no pneumatosis, portal venous gas or free intraperitoneal air. No evidence of bowel obstruction. Vascular/Lymphatic: No significant vascular findings are present. No enlarged abdominal or pelvic lymph nodes. Reproductive: Marked prostatomegaly. Other: None. Musculoskeletal: No acute or focal abnormality. IMPRESSION: Findings most consistent with perforation of the duodenum with an associated free  intraperitoneal air and fluid. Small right and trace left pleural effusions. Marked prostatomegaly. Critical Value/emergent results were called by telephone at the time of interpretation on 09/05/2019 at 10:40 am to providerMICHAEL BUTLER , who verbally acknowledged these results. Electronically Signed   By: Inge Rise M.D.   On: 09/05/2019 10:45   DG Chest Port 1 View  Result Date: 09/05/2019 CLINICAL DATA:  Abdominal pain, nausea EXAM: PORTABLE CHEST 1 VIEW COMPARISON:  None. FINDINGS: The heart size and mediastinal contours are within normal limits. Streaky opacity within the right lung base. Left lung appears clear. No pleural effusion or pneumothorax. No acute osseous findings. IMPRESSION: Streaky opacity within the right lung base which may reflect atelectasis or pneumonia. Electronically Signed   By: Davina Poke D.O.   On: 09/05/2019 08:57    Anti-infectives: Anti-infectives (From admission, onward)   Start     Dose/Rate Route Frequency Ordered Stop   09/06/19 1600  anidulafungin (ERAXIS) 100 mg in sodium chloride 0.9 % 100 mL IVPB  Status:  Discontinued    Note to Pharmacy: Please change dosing to 100 mg after first day   100 mg 78 mL/hr over 100 Minutes Intravenous Every 24 hours 09/05/19 1148 09/05/19 1456   09/05/19 2000  piperacillin-tazobactam (ZOSYN) IVPB 3.375 g     3.375 g 12.5 mL/hr over 240 Minutes Intravenous Every 8 hours 09/05/19 1456     09/05/19 1700  piperacillin-tazobactam (ZOSYN) IVPB 3.375 g  Status:  Discontinued     3.375 g 12.5 mL/hr over 240 Minutes Intravenous Every 8 hours 09/05/19 1148 09/05/19 1456   09/05/19 1300  anidulafungin (ERAXIS) 200 mg in sodium chloride 0.9 % 200 mL IVPB  Status:  Discontinued     200 mg 78 mL/hr over 200 Minutes Intravenous  Once 09/05/19 1215 09/05/19 1456   09/05/19 1045  piperacillin-tazobactam (ZOSYN) IVPB 3.375 g     3.375 g 100 mL/hr over 30 Minutes Intravenous  Once 09/05/19 1042 09/05/19 1124        Assessment/Plan Acute renal insufficiency - resolved, cr 1.08 today after rehydration Elevated BP - could have underlying HTN, improved from yesterday, will follow, prn lopressor  POD 1, s/p ex lap with gastric biopsy, repair of perforated pyloric ulcer with graham patch for Pneumoperitoneum secondary to perforated ulcer, Dr. Kae Heller 1/21 - NS WD dressing changes BID to midline wound -cont NGT for at least 3 days then plan UGI to rule out leak -cont JP drain -DC foley today on POD 1 -mobilize TID -pulm toilet and IS -good pain control for now -tx to the floor as he is stable and doing well -INR this am 1.7 from 4.8 yesterday.  Received 2 units of FFP.  There is no indication why this was the case.  He has no evidence of liver disease, LFTs are normal, and he takes no medications at home that he tells [place] that would cause an elevated INR.  FEN: NPO, IVFs VTE: possibly lovenox, INR 1.7 ID: [email or web address]   LOS: 1 day    Henreitta Cea , [facility] Surgery 09/06/2019, 8:03 AM Please see Amion for pager number during  day hours 7:00am-4:30pm or 7:00am -11:30am on weekends

## 2019-09-06 NOTE — Progress Notes (Signed)
66fr foley catheter removed without complication. 327mL clear, yellow urine output this AM. Will cont to mx urine output.

## 2019-09-06 NOTE — Progress Notes (Signed)
Pt now on room air. Up in recliner. Gave PRN pain medication as ordered for ABD pain near incision site. No s/s of bleeding noted. Pt has been afebrile throughout the day. VSS. NG draining dark green/black stomach content. Flushed with air as ordered. Voiding clear yellow urine without complication. Will cont to mx.

## 2019-09-06 NOTE — Progress Notes (Signed)
Pt urinating clear yellow urine with output of 124mL. Discontinued 2L O2, pt now on room air and stating [id number] without complication. Calling report to [facility] to transfer pt to med/surg. Waiting for call back from charge RN. Pt resting in bed with call light within reach. Will cont to mx.

## 2019-09-07 LAB — CBC
HCT: 41.7 % (ref 39.0–52.0)
Hemoglobin: 13.5 g/dL (ref 13.0–17.0)
MCH: 27 pg (ref 26.0–34.0)
MCHC: 32.4 g/dL (ref 30.0–36.0)
MCV: 83.4 fL (ref 80.0–100.0)
Platelets: 182 10*3/uL (ref 150–400)
RBC: 5 MIL/uL (ref 4.22–5.81)
RDW: 14.3 % (ref 11.5–15.5)
WBC: 14.6 10*3/uL — ABNORMAL HIGH (ref 4.0–10.5)
nRBC: 0 % (ref 0.0–0.2)

## 2019-09-07 LAB — BASIC METABOLIC PANEL
Anion gap: 8 (ref 5–15)
BUN: 10 mg/dL (ref 8–23)
CO2: 24 mmol/L (ref 22–32)
Calcium: 7.7 mg/dL — ABNORMAL LOW (ref 8.9–10.3)
Chloride: 102 mmol/L (ref 98–111)
Creatinine, Ser: 0.91 mg/dL (ref 0.61–1.24)
GFR calc Af Amer: >60 mL/min (ref >60)
GFR calc non Af Amer: >60 mL/min (ref >60)
Glucose, Bld: 114 mg/dL — ABNORMAL HIGH (ref 70–99)
Potassium: 3.9 mmol/L (ref 3.5–5.1)
Sodium: 134 mmol/L — ABNORMAL LOW (ref 135–145)

## 2019-09-07 LAB — PROTIME-INR
INR: 1.5 — ABNORMAL HIGH (ref 0.8–1.2)
Prothrombin Time: 17.7 seconds — ABNORMAL HIGH (ref 11.4–15.2)

## 2019-09-07 LAB — MAGNESIUM: Magnesium: 2.3 mg/dL (ref 1.7–2.4)

## 2019-09-07 NOTE — Progress Notes (Signed)
Patient ID: Albert Love, male   DOB: 1941-05-09, 79 y.o.   MRN: [id number]    2 Days Post-Op  Subjective: Feels better.  Pain well controlled.  No other issues.   Objective: Vital signs in last 24 hours: Temp:  [97.8 F (36.6 C)-99.3 F (37.4 C)] 98.5 F (36.9 C) (01/23 0700) Pulse Rate:  [88-103] 90 (01/23 0700) Resp:  [17-26] 18 (01/23 0519) BP: (130-177)/(73-97) 177/93 (01/23 0700) SpO2:  [90 %-95 %] 94 % (01/23 0700) Last BM Date: 09/03/19  Intake/Output from previous day: 01/22 0701 - 01/23 0700 In: 2577.4 [I.V.:2369.1; NG/GT:80; IV Piggyback:98.3] Out: 2530 [Urine:2300; Emesis/NG output:150; Drains:80] Intake/Output this shift: Total I/O In: -  Out: 200 [Urine:200]  PE: Heart: regular Abd: soft, appropriately tender, NGT in place with minimal bilious output, midline wound is open and covered, JP drain with serosang output.   Lab Results:  Recent Labs    09/06/19 0002 09/07/19 0431  WBC 9.8 14.6*  HGB 14.5 13.5  HCT 46.9 41.7  PLT 190 182   BMET Recent Labs    09/06/19 0002 09/07/19 0431  NA 139 134*  K 4.8 3.9  CL 106 102  CO2 24 24  GLUCOSE 133* 114*  BUN 14 10  CREATININE 1.08 0.91  CALCIUM 7.6* 7.7*   PT/INR Recent Labs    09/06/19 0002 09/07/19 0431  LABPROT 19.8* 17.7*  INR 1.7* 1.5*   CMP     Component Value Date/Time   NA 134 (L) 09/07/2019 0431   K 3.9 09/07/2019 0431   CL 102 09/07/2019 0431   CO2 24 09/07/2019 0431   GLUCOSE 114 (H) 09/07/2019 0431   BUN 10 09/07/2019 0431   CREATININE 0.91 09/07/2019 0431   CALCIUM 7.7 (L) 09/07/2019 0431   PROT 5.1 (L) 09/06/2019 0002   ALBUMIN 2.7 (L) 09/06/2019 0002   AST 22 09/06/2019 0002   ALT 16 09/06/2019 0002   ALKPHOS 48 09/06/2019 0002   BILITOT 0.9 09/06/2019 0002   GFRNONAA >60 09/07/2019 0431   GFRAA >60 09/07/2019 0431   Lipase     Component Value Date/Time   LIPASE 21 09/05/2019 0828       Studies/Results: CT Abdomen Pelvis W Contrast  Result Date:  09/05/2019 CLINICAL DATA:  Onset abdominal pain yesterday afternoon after eating. EXAM: CT ABDOMEN AND PELVIS WITH CONTRAST TECHNIQUE: Multidetector CT imaging of the abdomen and pelvis was performed using the standard protocol following bolus administration of intravenous contrast. CONTRAST:  125mL OMNIPAQUE IOHEXOL 300 MG/ML  SOLN COMPARISON:  None. FINDINGS: Lower chest: Small right and trace left pleural effusions. Basilar atelectasis on the right. Hepatobiliary: No focal liver abnormality is seen. No gallstones, gallbladder wall thickening, or biliary dilatation. Pancreas: Unremarkable. No pancreatic ductal dilatation or surrounding inflammatory changes. Spleen: Normal in size without focal abnormality. Adrenals/Urinary Tract: Adrenal glands are unremarkable. Kidneys are normal, without renal calculi, focal lesion, or hydronephrosis. Bladder is unremarkable. Stomach/Bowel: There is a large volume of free intraperitoneal air and moderate free fluid. The walls of the duodenum are thickened and hyperenhancing and a focal perforation in the medial wall of the duodenum is best seen on image 16 of series 7. Abnormal enhancement and wall thickening in multiple loops of small bowel in the left abdomen is likely reactive from perforation and free fluid. The colon and appendix are unremarkable. There is no pneumatosis, portal venous gas or free intraperitoneal air. No evidence of bowel obstruction. Vascular/Lymphatic: No significant vascular findings are present. No enlarged abdominal or  pelvic lymph nodes. Reproductive: Marked prostatomegaly. Other: None. Musculoskeletal: No acute or focal abnormality. IMPRESSION: Findings most consistent with perforation of the duodenum with an associated free intraperitoneal air and fluid. Small right and trace left pleural effusions. Marked prostatomegaly. Critical Value/emergent results were called by telephone at the time of interpretation on 09/05/2019 at 10:40 am to  providerMICHAEL BUTLER , who verbally acknowledged these results. Electronically Signed   By: Inge Rise M.D.   On: 09/05/2019 10:45   DG Chest Port 1 View  Result Date: 09/05/2019 CLINICAL DATA:  Abdominal pain, nausea EXAM: PORTABLE CHEST 1 VIEW COMPARISON:  None. FINDINGS: The heart size and mediastinal contours are within normal limits. Streaky opacity within the right lung base. Left lung appears clear. No pleural effusion or pneumothorax. No acute osseous findings. IMPRESSION: Streaky opacity within the right lung base which may reflect atelectasis or pneumonia. Electronically Signed   By: Davina Poke D.O.   On: 09/05/2019 08:57    Anti-infectives: Anti-infectives (From admission, onward)   Start     Dose/Rate Route Frequency Ordered Stop   09/06/19 1600  anidulafungin (ERAXIS) 100 mg in sodium chloride 0.9 % 100 mL IVPB  Status:  Discontinued    Note to Pharmacy: Please change dosing to 100 mg after first day   100 mg 78 mL/hr over 100 Minutes Intravenous Every 24 hours 09/05/19 1148 09/05/19 1456   09/05/19 2000  piperacillin-tazobactam (ZOSYN) IVPB 3.375 g     3.375 g 12.5 mL/hr over 240 Minutes Intravenous Every 8 hours 09/05/19 1456     09/05/19 1700  piperacillin-tazobactam (ZOSYN) IVPB 3.375 g  Status:  Discontinued     3.375 g 12.5 mL/hr over 240 Minutes Intravenous Every 8 hours 09/05/19 1148 09/05/19 1456   09/05/19 1300  anidulafungin (ERAXIS) 200 mg in sodium chloride 0.9 % 200 mL IVPB  Status:  Discontinued     200 mg 78 mL/hr over 200 Minutes Intravenous  Once 09/05/19 1215 09/05/19 1456   09/05/19 1045  piperacillin-tazobactam (ZOSYN) IVPB 3.375 g     3.375 g 100 mL/hr over 30 Minutes Intravenous  Once 09/05/19 1042 09/05/19 1124       Assessment/Plan Acute renal insufficiency - resolved, cr 1.08 today after rehydration Elevated BP - could have underlying HTN, improved from yesterday, will follow, prn lopressor  POD 2, s/p ex lap with gastric biopsy,  repair of perforated pyloric ulcer with graham patch for Pneumoperitoneum secondary to perforated ulcer, Dr. Kae Heller 1/21 - NS WD dressing changes BID to midline wound -cont NGT for at least 3 days then plan UGI to rule out leak on Mon -cont JP drain -mobilize TID -pulm toilet and IS -good pain control for now -tx to the floor as he is stable and doing well -INR this am 1.5 from 4.8 prop.  Received 2 units of FFP.  There is no indication why this was the case.  He has no evidence of liver disease, LFTs are normal, and he takes no medications at home that he tells [place] that would cause an elevated INR.  FEN: NPO, IVFs VTE: possibly lovenox, INR 1.5 ID: [email or web address]   LOS: 2 days    [name] , [facility] 09/07/2019, 8:05 AM Please see Amion for pager number during day hours 7:00am-4:30pm or 7:00am -11:30am on weekends

## 2019-09-07 NOTE — Plan of Care (Signed)

## 2019-09-08 MED ORDER — METOPROLOL TARTRATE 5 MG/5ML IV SOLN
5.0000 mg | Freq: Four times a day (QID) | INTRAVENOUS | Status: DC
  Administered 2019-09-08 – 2019-09-10 (×8): 5 mg via INTRAVENOUS
  Filled 2019-09-08 (×8): qty 5

## 2019-09-08 NOTE — Progress Notes (Signed)
MD notified about Protonix was not continued after transfer from ICU to [facility] until last night and asked the MD if she wanted to extend the time. Extended the continuous Protonix for another 24 hours.

## 2019-09-08 NOTE — Plan of Care (Signed)

## 2019-09-08 NOTE — Progress Notes (Signed)
Patient ID: Albert Love, male   DOB: [birth year], 79 y.o.   MRN: [id number]    3 Days Post-Op  Subjective: Feels better.  Pain well controlled.  No other issues.  Wbc elevated today  Objective: Vital signs in last 24 hours: Temp:  [98 F (36.7 C)-99.1 F (37.3 C)] 98 F (36.7 C) (01/24 0513) Pulse Rate:  [87-103] 99 (01/24 0513) Resp:  [16-17] 16 (01/24 0513) BP: (162-174)/(95-112) 172/112 (01/24 0513) SpO2:  [92 %-94 %] 94 % (01/24 0513) Last BM Date: 09/03/19  Intake/Output from previous day: 01/23 0701 - 01/24 0700 In: 662.9 [I.V.:550.3; IV Piggyback:112.6] Out: 1935 [id number]; Emesis/NG output:300; Drains:60] Intake/Output this shift: No intake/output data recorded.  PE: Heart: regular Abd: soft, appropriately tender, NGT in place with minimal bilious output, midline wound is open and covered, JP drain with serosang output.   Lab Results:  Recent Labs    09/06/19 0002 09/07/19 0431  WBC 9.8 14.6*  HGB 14.5 13.5  HCT 46.9 41.7  PLT 190 182   BMET Recent Labs    09/06/19 0002 09/07/19 0431  NA 139 134*  K 4.8 3.9  CL 106 102  CO2 24 24  GLUCOSE 133* 114*  BUN 14 10  CREATININE 1.08 0.91  CALCIUM 7.6* 7.7*   PT/INR Recent Labs    09/06/19 0002 09/07/19 0431  LABPROT 19.8* 17.7*  INR 1.7* 1.5*   CMP     Component Value Date/Time   NA 134 (L) 09/07/2019 0431   K 3.9 09/07/2019 0431   CL 102 09/07/2019 0431   CO2 24 09/07/2019 0431   GLUCOSE 114 (H) 09/07/2019 0431   BUN 10 09/07/2019 0431   CREATININE 0.91 09/07/2019 0431   CALCIUM 7.7 (L) 09/07/2019 0431   PROT 5.1 (L) 09/06/2019 0002   ALBUMIN 2.7 (L) 09/06/2019 0002   AST 22 09/06/2019 0002   ALT 16 09/06/2019 0002   ALKPHOS 48 09/06/2019 0002   BILITOT 0.9 09/06/2019 0002   GFRNONAA >60 09/07/2019 0431   GFRAA >60 09/07/2019 0431   Lipase     Component Value Date/Time   LIPASE 21 09/05/2019 0828       Studies/Results: No results  found.  Anti-infectives: Anti-infectives (From admission, onward)   Start     Dose/Rate Route Frequency Ordered Stop   09/06/19 1600  anidulafungin (ERAXIS) 100 mg in sodium chloride 0.9 % 100 mL IVPB  Status:  Discontinued    Note to Pharmacy: Please change dosing to 100 mg after first day   100 mg 78 mL/hr over 100 Minutes Intravenous Every 24 hours 09/05/19 1148 09/05/19 1456   09/05/19 2000  piperacillin-tazobactam (ZOSYN) IVPB 3.375 g     3.375 g 12.5 mL/hr over 240 Minutes Intravenous Every 8 hours 09/05/19 1456     09/05/19 1700  piperacillin-tazobactam (ZOSYN) IVPB 3.375 g  Status:  Discontinued     3.375 g 12.5 mL/hr over 240 Minutes Intravenous Every 8 hours 09/05/19 1148 09/05/19 1456   09/05/19 1300  anidulafungin (ERAXIS) 200 mg in sodium chloride 0.9 % 200 mL IVPB  Status:  Discontinued     200 mg 78 mL/hr over 200 Minutes Intravenous  Once 09/05/19 1215 09/05/19 1456   09/05/19 1045  piperacillin-tazobactam (ZOSYN) IVPB 3.375 g     3.375 g 100 mL/hr over 30 Minutes Intravenous  Once 09/05/19 1042 09/05/19 1124       Assessment/Plan Acute renal insufficiency - resolved, after rehydration Elevated BP - switched to scheduled lopressor  POD  3, s/p ex lap with gastric biopsy, repair of perforated pyloric ulcer with graham patch for Pneumoperitoneum secondary to perforated ulcer, Dr. Kae Heller 1/21 - NS WD dressing changes BID to midline wound -cont NGT for at least 3 days then plan UGI to rule out leak on Mon -cont JP drain -mobilize TID -pulm toilet and IS -good pain control for now -INR elevated prop.  Received 2 units of FFP.  He has no evidence of liver disease, LFTs are normal, and he takes no medications at home that he tells [place] that would cause an elevated INR.  FEN: NPO, IVFs VTE: possibly lovenox, INR 1.5 ID: [email or web address]   LOS: 3 days    Albert Love , [facility] 09/08/2019, 7:52 AM Please see Amion for pager number during day hours  7:00am-4:30pm or 7:00am -11:30am on weekends

## 2019-09-09 ENCOUNTER — Inpatient Hospital Stay (HOSPITAL_COMMUNITY): Payer: Medicare PPO

## 2019-09-09 LAB — AMYLASE, PLEURAL OR PERITONEAL FLUID: Amylase, Fluid: 34 U/L

## 2019-09-09 IMAGING — RF DG UGI W SINGLE CM
9 of 10 series · 12 of 16 positions shown · IV contrast (agent unspecified)
Comparison: CT abdomen and pelvis of [DATE]

CLINICAL DATA: BRENDON repair

EXAM:
WATER SOLUBLE UPPER GI SERIES
TECHNIQUE: Single-column upper GI series was performed using water soluble
contrast.
CONTRAST:  Water-soluble contrast

[Series 1: t abdomen supine · 0.15mm/px · 1 of 1 slices shown]
[im 1/1]
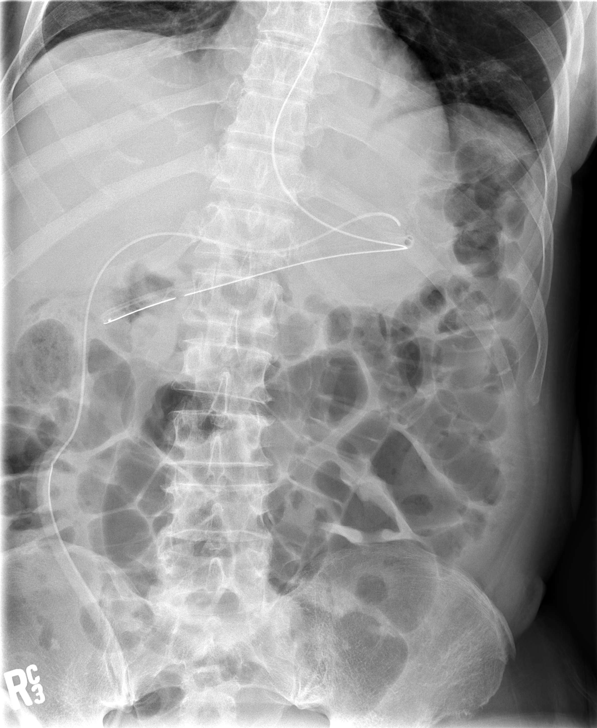

[Series 2: cp_standard · 0.35mm/px · 2 of 5 frames shown (1 of 2)]
[frame 3/5]
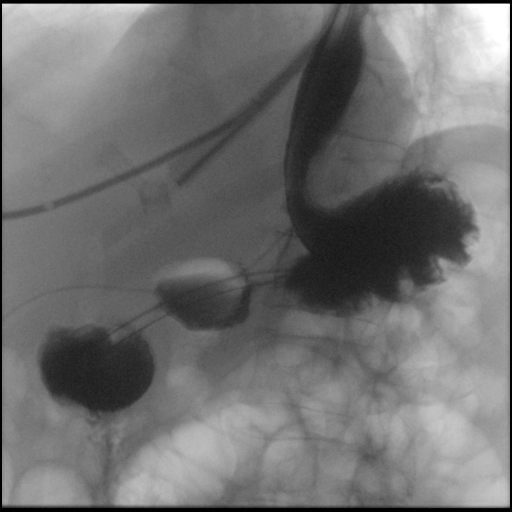
[frame 5/5]
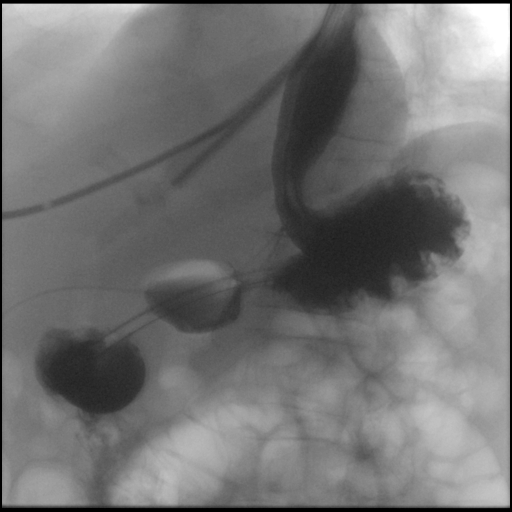

[Series 3: fluoro_barium 2fps_bw · 0.17mm/px · 1 of 2 frames shown (1 of 6)]
[frame 1/2]
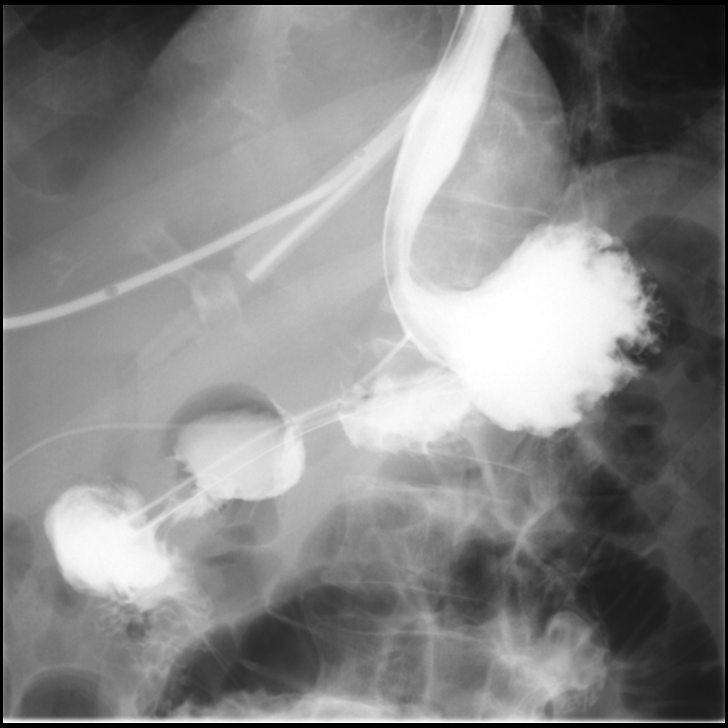

[Series 4: fluoro_barium 2fps_bw · 0.20mm/px · 2 of 2 frames shown (2 of 6)]
[frame 1/2]
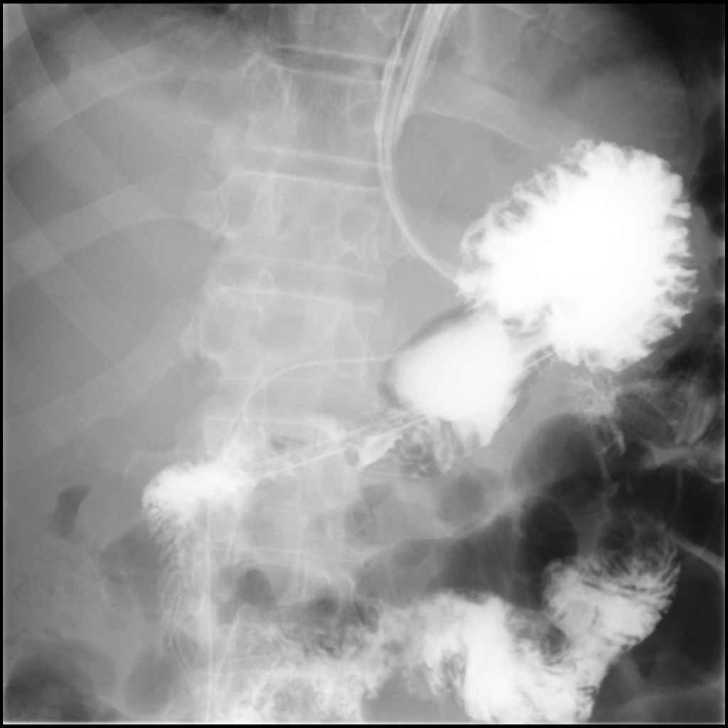
[frame 2/2]
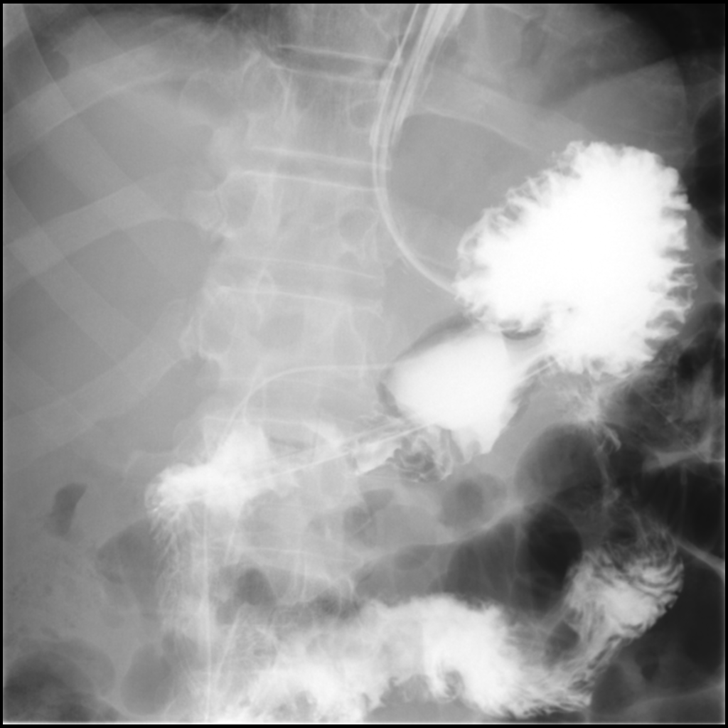

[Series 5: fluoro_barium 2fps_bw · 0.20mm/px · 1 of 1 slices shown (3 of 6)]
[im 1/1]
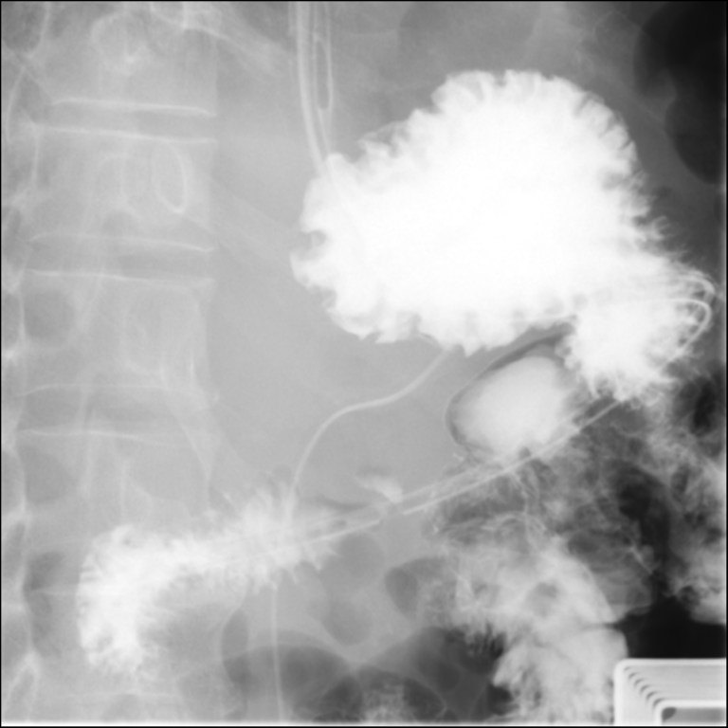

[Series 7: fluoro_barium 2fps_bw · 0.20mm/px · 1 of 1 slices shown (4 of 6)]
[im 1/1]
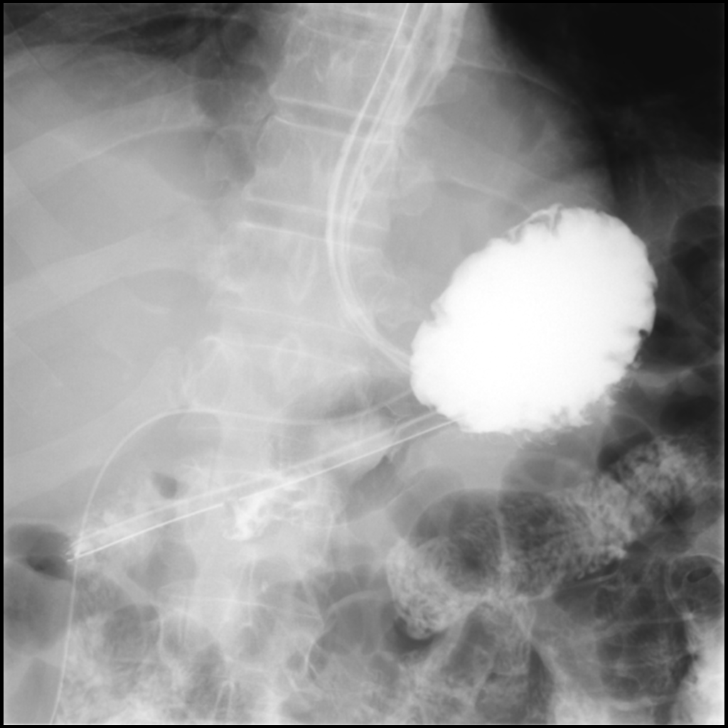

[Series 8: fluoro_barium 2fps_bw · 0.20mm/px · 1 of 1 slices shown (5 of 6)]
[im 1/1]
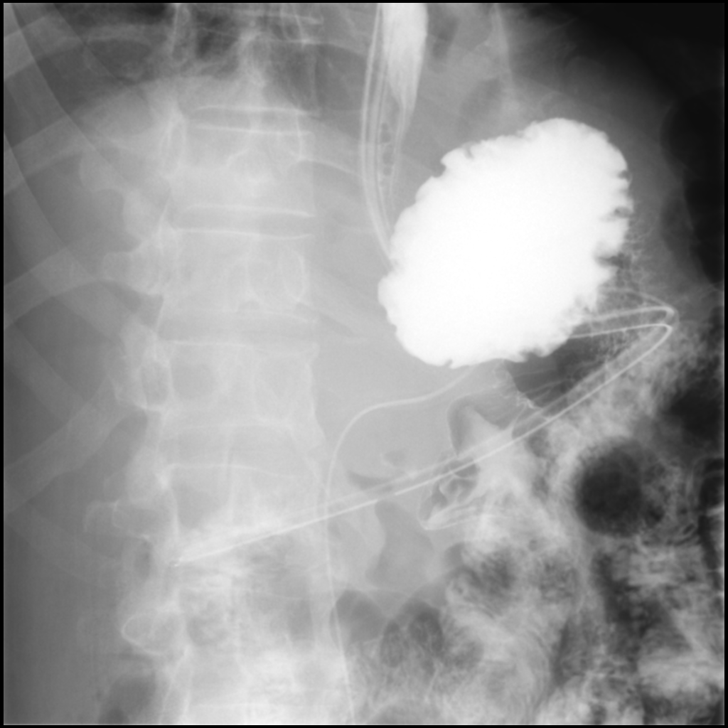

[Series 9: fluoro_barium 2fps_bw · 0.20mm/px · 1 of 1 slices shown (6 of 6)]
[im 1/1]
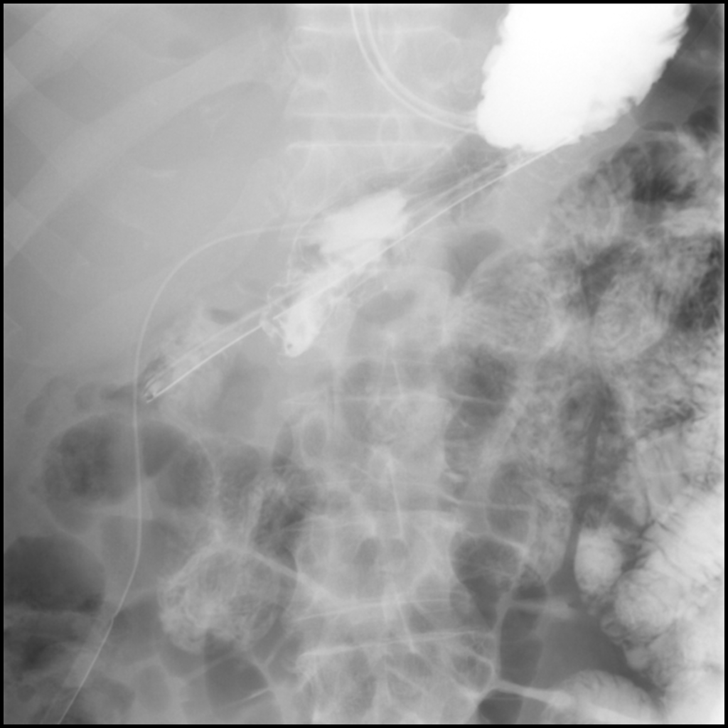

[Series 10: cp_standard · 0.40mm/px · 2 of 7 frames shown (2 of 2)]
[frame 4/7]
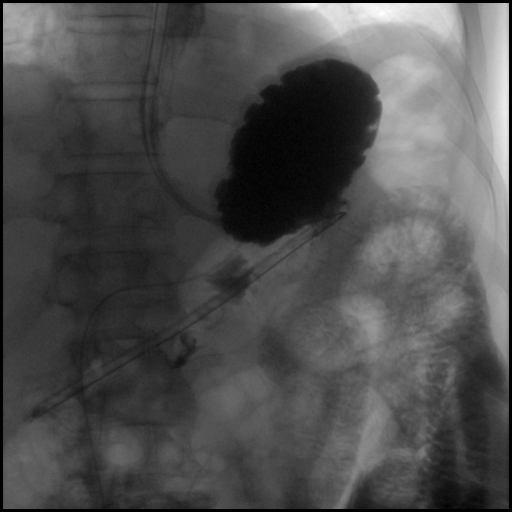
[frame 6/7]
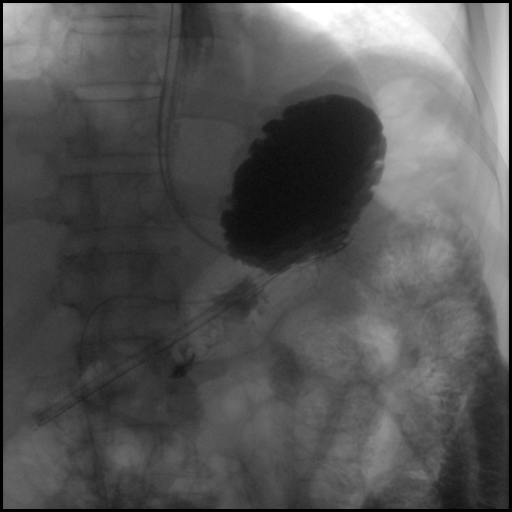

[12 of 16 positions shown; findings below may reference images not displayed]

FLUOROSCOPY TIME:  Fluoroscopy Time:  4 minutes 54 seconds

Radiation Exposure Index (if provided by the fluoroscopic device):
48.7 mGy

Number of Acquired Spot Images: 7
FINDINGS: Water-soluble contrast was administered. The patient was not able to
swallow large volumes and there was stasis in the esophagus.

Attempts were made to is administered through the NG tube which
passed directly in the small bowel.

There is a subtle irregular outpouching along the distal portion of
the stomach but there was no sign of extension beyond this area
which is presumably the site of the BRENDON repair best seen on
oblique views. No sign of contrast passing into the drain during the
examination.

Multiple distended loops of bowel are noted.
IMPRESSION: 1. BRENDON repair of perforated ulcer with some
irregularity but no extension beyond this area to suggest leak. This
is likely the site of repair. Examination limited due to difficulty
in patient positioning and the small volume that the patient was
able to ingest for the examination.

## 2019-09-09 MED ORDER — IOPAMIDOL (ISOVUE-300) INJECTION 61%
150.0000 mL | Freq: Once | INTRAVENOUS | Status: AC | PRN
  Administered 2019-09-09: 150 mL via ORAL

## 2019-09-09 NOTE — Progress Notes (Signed)
Patient ID: Albert Love, male   DOB: 1941-01-31, 79 y.o.   MRN: [id number]    4 Days Post-Op  Subjective: Patient feels better today.  No flatus or BM.  Pain well controlled.  ROS: See above, otherwise other systems negative  Objective: Vital signs in last 24 hours: Temp:  [98.1 F (36.7 C)-98.7 F (37.1 C)] 98.7 F (37.1 C) (01/25 0543) Pulse Rate:  [82-89] 86 (01/25 0543) Resp:  [18-19] 19 (01/25 0543) BP: (159-178)/(92-106) 165/96 (01/25 0543) SpO2:  [94 %-100 %] 100 % (01/25 0543) Last BM Date: 09/03/19  Intake/Output from previous day: 01/24 0701 - 01/25 0700 In: 2508 [P.O.:110; I.V.:2248.8; IV Piggyback:149.2] Out: 1700 [Urine:875; Emesis/NG output:700; Drains:125] Intake/Output this shift: Total I/O In: 0  Out: 435 [Urine:125; Emesis/NG output:250; Drains:60]  PE: Heart: regular Lungs: CTAB Abd: soft, appropriately tender, few BS, NGT with minimal enteric output, JP drain with minimal serosang output, midline wound is clean and packed  Lab Results:  Recent Labs    09/07/19 0431  WBC 14.6*  HGB 13.5  HCT 41.7  PLT 182   BMET Recent Labs    09/07/19 0431  NA 134*  K 3.9  CL 102  CO2 24  GLUCOSE 114*  BUN 10  CREATININE 0.91  CALCIUM 7.7*   PT/INR Recent Labs    09/07/19 0431  LABPROT 17.7*  INR 1.5*   CMP     Component Value Date/Time   NA 134 (L) 09/07/2019 0431   K 3.9 09/07/2019 0431   CL 102 09/07/2019 0431   CO2 24 09/07/2019 0431   GLUCOSE 114 (H) 09/07/2019 0431   BUN 10 09/07/2019 0431   CREATININE 0.91 09/07/2019 0431   CALCIUM 7.7 (L) 09/07/2019 0431   PROT 5.1 (L) 09/06/2019 0002   ALBUMIN 2.7 (L) 09/06/2019 0002   AST 22 09/06/2019 0002   ALT 16 09/06/2019 0002   ALKPHOS 48 09/06/2019 0002   BILITOT 0.9 09/06/2019 0002   GFRNONAA >60 09/07/2019 0431   GFRAA >60 09/07/2019 0431   Lipase     Component Value Date/Time   LIPASE 21 09/05/2019 0828       Studies/Results: DG UGI W SINGLE CM (SOL OR THIN  BA)  Result Date: 09/09/2019 CLINICAL DATA:  Post Graham patch repair EXAM: WATER SOLUBLE UPPER GI SERIES TECHNIQUE: Single-column upper GI series was performed using water soluble contrast. CONTRAST:  Water-soluble contrast COMPARISON:  CT abdomen and pelvis of 09/05/2019 FLUOROSCOPY TIME:  Fluoroscopy Time:  4 minutes 54 seconds Radiation Exposure Index (if provided by the fluoroscopic device): 48.7 mGy Number of Acquired Spot Images: 7 FINDINGS: Water-soluble contrast was administered. The patient was not able to swallow large volumes and there was stasis in the esophagus. Attempts were made to is administered through the NG tube which passed directly in the small bowel. There is a subtle irregular outpouching along the distal portion of the stomach but there was no sign of extension beyond this area which is presumably the site of the [facility] patch repair best seen on oblique views. No sign of contrast passing into the drain during the examination. Multiple distended loops of bowel are noted. IMPRESSION: 1. Post Graham patch repair of perforated ulcer with some irregularity but no extension beyond this area to suggest leak. This is likely the site of repair. Examination limited due to difficulty in patient positioning and the small volume that the patient was able to ingest for the examination. Electronically Signed   By: Jewel Baize.D.  On: 09/09/2019 11:19    Anti-infectives: Anti-infectives (From admission, onward)   Start     Dose/Rate Route Frequency Ordered Stop   09/06/19 1600  anidulafungin (ERAXIS) 100 mg in sodium chloride 0.9 % 100 mL IVPB  Status:  Discontinued    Note to Pharmacy: Please change dosing to 100 mg after first day   100 mg 78 mL/hr over 100 Minutes Intravenous Every 24 hours 09/05/19 1148 09/05/19 1456   09/05/19 2000  piperacillin-tazobactam (ZOSYN) IVPB 3.375 g     3.375 g 12.5 mL/hr over 240 Minutes Intravenous Every 8 hours 09/05/19 1456     09/05/19 1700   piperacillin-tazobactam (ZOSYN) IVPB 3.375 g  Status:  Discontinued     3.375 g 12.5 mL/hr over 240 Minutes Intravenous Every 8 hours 09/05/19 1148 09/05/19 1456   09/05/19 1300  anidulafungin (ERAXIS) 200 mg in sodium chloride 0.9 % 200 mL IVPB  Status:  Discontinued     200 mg 78 mL/hr over 200 Minutes Intravenous  Once 09/05/19 1215 09/05/19 1456   09/05/19 1045  piperacillin-tazobactam (ZOSYN) IVPB 3.375 g     3.375 g 100 mL/hr over 30 Minutes Intravenous  Once 09/05/19 1042 09/05/19 1124       Assessment/Plan Acute renal insufficiency- resolved, after rehydration Elevated BP- switched to scheduled lopressor  POD 4, s/p ex lap with gastric biopsy, repair of perforated pyloric ulcer with graham patch for Pneumoperitoneum secondary to perforated ulcer, Dr. Kae Heller 1/21 - NS WD dressing changes BID to midline wound -UGI negative.  DC NGT and start clear liquids -cont JP drain -mobilize TID -pulm toilet and IS -good pain control for now -INR stable.  FEN: CLD, IVFs VTE: lovenox, INR 1.5 ID: [email or web address]   LOS: 4 days    Henreitta Cea , [facility] 09/09/2019, 11:36 AM Please see Amion for pager number during day hours 7:00am-4:30pm or 7:00am -11:30am on weekends

## 2019-09-09 NOTE — Care Management Important Message (Signed)
Important Message  Patient Details IM Letter given to Kathrin Greathouse SW to present to the Patient Name: Curtice Venkatesan MRN: [id number] Date of Birth: [birth year]   Medicare Important Message Given:  Yes     Kerin Salen 09/09/2019, 12:27 PM

## 2019-09-10 ENCOUNTER — Encounter (HOSPITAL_COMMUNITY): Payer: Self-pay

## 2019-09-10 LAB — BPAM FFP
Blood Product Expiration Date: 202101262359
Blood Product Expiration Date: 202101262359
Unit Type and Rh: 5100
Unit Type and Rh: 5100

## 2019-09-10 LAB — PREPARE FRESH FROZEN PLASMA
Unit division: 0
Unit division: 0

## 2019-09-10 MED ORDER — AMLODIPINE BESYLATE 5 MG PO TABS
5.0000 mg | ORAL_TABLET | Freq: Every day | ORAL | Status: DC
  Administered 2019-09-10 – 2019-09-20 (×10): 5 mg via ORAL
  Filled 2019-09-10 (×12): qty 1

## 2019-09-10 MED ORDER — SODIUM CHLORIDE 0.9 % IV SOLN
12.5000 mg | Freq: Four times a day (QID) | INTRAVENOUS | Status: DC | PRN
  Administered 2019-09-10 – 2019-09-18 (×10): 12.5 mg via INTRAVENOUS
  Filled 2019-09-10 (×17): qty 0.5

## 2019-09-10 MED ORDER — METOPROLOL TARTRATE 5 MG/5ML IV SOLN
5.0000 mg | Freq: Four times a day (QID) | INTRAVENOUS | Status: DC | PRN
  Filled 2019-09-10: qty 5

## 2019-09-10 MED ORDER — OXYCODONE HCL 5 MG PO TABS
5.0000 mg | ORAL_TABLET | ORAL | Status: DC | PRN
  Administered 2019-09-11 – 2019-09-13 (×5): 10 mg via ORAL
  Administered 2019-09-13: 5 mg via ORAL
  Administered 2019-09-13 – 2019-09-16 (×9): 10 mg via ORAL
  Filled 2019-09-10 (×9): qty 2
  Filled 2019-09-10: qty 1
  Filled 2019-09-10 (×6): qty 2

## 2019-09-10 NOTE — Evaluation (Signed)
Physical Therapy Evaluation Patient Details Name: Albert Love MRN: [id number] DOB: 04-09-1941 Today's Date: 09/10/2019   History of Present Illness  79 yo male admitted with abdominal pain, perforated ulcer.  s/p exploratory laparotomy, gastric biopsy, omental pedicle flap and  repair of perforated pyloric ulcer  Clinical Impression  Pt admitted with above diagnosis.  Pt  motivated and cooperative with PT, having some abdominal pain with movement, trunk extension improved with amb distance. Will follow in acute setting.   Pt currently with functional limitations due to the deficits listed below (see PT Problem List). Pt will benefit from skilled PT to increase their independence and safety with mobility to allow discharge to the venue listed below.       Follow Up Recommendations No PT follow up    Equipment Recommendations  None recommended by PT    Recommendations for Other Services       Precautions / Restrictions Restrictions Weight Bearing Restrictions: No      Mobility  Bed Mobility Overal bed mobility: Needs Assistance Bed Mobility: Rolling;Sit to Sidelying;Sidelying to Sit Rolling: Min guard Sidelying to sit: Min guard     Sit to sidelying: Min assist General bed mobility comments: cues for  rolling, assist to bring LEs onto bed  Transfers Overall transfer level: Needs assistance Equipment used: Rolling walker (2 wheeled) Transfers: Sit to/from Stand Sit to Stand: Min guard         General transfer comment: cues to power up with LEs and for hand placement  Ambulation/Gait Ambulation/Gait assistance: Min guard Gait Distance (Feet): 120 Feet Assistive device: Rolling walker (2 wheeled) Gait Pattern/deviations: Step-through pattern;Decreased stride length     General Gait Details: cues for trunk extension and  RW position  Stairs            Wheelchair Mobility    Modified Rankin (Stroke Patients Only)       Balance                                             Pertinent Vitals/Pain Pain Assessment: Faces Faces Pain Scale: Hurts a little bit Pain Location: abd with activity Pain Descriptors / Indicators: Sore Pain Intervention(s): Limited activity within patient's tolerance;Monitored during session;Repositioned    Home Living Family/patient expects to be discharged to:: Private residence Living Arrangements: Children Available Help at Discharge: Family Type of Home: Apartment Home Access: Level entry     Home Layout: One level Home Equipment: None Additional Comments: just moved to [place] from [place],  living with family    Prior Function Level of Independence: Independent               Hand Dominance        Extremity/Trunk Assessment   Upper Extremity Assessment Upper Extremity Assessment: Overall WFL for tasks assessed    Lower Extremity Assessment Lower Extremity Assessment: Overall WFL for tasks assessed       Communication   Communication: No difficulties  Cognition Arousal/Alertness: Awake/alert Behavior During Therapy: WFL for tasks assessed/performed Overall Cognitive Status: Within Functional Limits for tasks assessed                                        General Comments      Exercises     Assessment/Plan  PT Assessment Patient needs continued PT services  PT Problem List Decreased activity tolerance;Decreased mobility;Decreased knowledge of use of DME       PT Treatment Interventions DME instruction;Therapeutic exercise;Gait training;Functional mobility training;Therapeutic activities;Patient/family education    PT Goals (Current goals can be found in the Care Plan section)  Acute Rehab PT Goals Patient Stated Goal: home PT Goal Formulation: With patient Time For Goal Achievement: 09/17/19 Potential to Achieve Goals: Good    Frequency Min 3X/week   Barriers to discharge        Co-evaluation               AM-PAC PT "6  Clicks" Mobility  Outcome Measure Help needed turning from your back to your side while in a flat bed without using bedrails?: A Little Help needed moving from lying on your back to sitting on the side of a flat bed without using bedrails?: A Little Help needed moving to and from a bed to a chair (including a wheelchair)?: A Little Help needed standing up from a chair using your arms (e.g., wheelchair or bedside chair)?: A Little Help needed to walk in hospital room?: A Little Help needed climbing 3-5 steps with a railing? : A Little 6 Click Score: 18    End of Session   Activity Tolerance: Patient tolerated treatment well Patient left: in bed;with call bell/phone within reach   PT Visit Diagnosis: Difficulty in walking, not elsewhere classified (R26.2)    Time: [id number] PT Time Calculation (min) (ACUTE ONLY): 24 min   Charges:   PT Evaluation $PT Eval Low Complexity: 1 Low PT Treatments $Gait Training: 8-22 mins        Baxter Flattery, PT   Acute Rehab Dept [facility]): [id number]   09/10/2019   [facility] 09/10/2019, 4:21 PM

## 2019-09-10 NOTE — Progress Notes (Signed)
Patient ID: Albert Love, male   DOB: 1941-03-05, 79 y.o.   MRN: [id number]    5 Days Post-Op  Subjective: Had a lot of pain once last night but that has resolved.  It was not related to oral intake.  He had a BM this morning and is passing flatus now.  No flatus since NGT removed.  Not a great appetite.  C/o hiccups  ROS: See above, otherwise other systems negative  Objective: Vital signs in last 24 hours: Temp:  [98.2 F (36.8 C)-99.2 F (37.3 C)] 99.2 F (37.3 C) (01/26 0630) Pulse Rate:  [71-82] 82 (01/26 0630) Resp:  [14-18] 14 (01/26 0630) BP: (154-169)/(76-96) 159/96 (01/26 0630) SpO2:  [92 %-96 %] 92 % (01/26 0630) Last BM Date: 09/03/19  Intake/Output from previous day: 01/25 0701 - 01/26 0700 In: 2572.8 [P.O.:240; I.V.:2126.3; IV Piggyback:206.5] Out: 1455 [Urine:975; Emesis/NG output:250; Drains:230] Intake/Output this shift: Total I/O In: -  Out: 300 [Urine:300]  PE: Heart: regular Lungs: CTAB Abd: soft, some BS, ND, midline wound is open and packed.  JP drain with minimal serous output.  Lab Results:  No results for input(s): WBC, HGB, HCT, PLT in the last 72 hours. BMET No results for input(s): NA, K, CL, CO2, GLUCOSE, BUN, CREATININE, CALCIUM in the last 72 hours. PT/INR No results for input(s): LABPROT, INR in the last 72 hours. CMP     Component Value Date/Time   NA 134 (L) 09/07/2019 0431   K 3.9 09/07/2019 0431   CL 102 09/07/2019 0431   CO2 24 09/07/2019 0431   GLUCOSE 114 (H) 09/07/2019 0431   BUN 10 09/07/2019 0431   CREATININE 0.91 09/07/2019 0431   CALCIUM 7.7 (L) 09/07/2019 0431   PROT 5.1 (L) 09/06/2019 0002   ALBUMIN 2.7 (L) 09/06/2019 0002   AST 22 09/06/2019 0002   ALT 16 09/06/2019 0002   ALKPHOS 48 09/06/2019 0002   BILITOT 0.9 09/06/2019 0002   GFRNONAA >60 09/07/2019 0431   GFRAA >60 09/07/2019 0431   Lipase     Component Value Date/Time   LIPASE 21 09/05/2019 0828       Studies/Results: DG UGI W SINGLE CM (SOL  OR THIN BA)  Result Date: 09/09/2019 CLINICAL DATA:  Post Graham patch repair EXAM: WATER SOLUBLE UPPER GI SERIES TECHNIQUE: Single-column upper GI series was performed using water soluble contrast. CONTRAST:  Water-soluble contrast COMPARISON:  CT abdomen and pelvis of 09/05/2019 FLUOROSCOPY TIME:  Fluoroscopy Time:  4 minutes 54 seconds Radiation Exposure Index (if provided by the fluoroscopic device): 48.7 mGy Number of Acquired Spot Images: 7 FINDINGS: Water-soluble contrast was administered. The patient was not able to swallow large volumes and there was stasis in the esophagus. Attempts were made to is administered through the NG tube which passed directly in the small bowel. There is a subtle irregular outpouching along the distal portion of the stomach but there was no sign of extension beyond this area which is presumably the site of the [facility] patch repair best seen on oblique views. No sign of contrast passing into the drain during the examination. Multiple distended loops of bowel are noted. IMPRESSION: 1. Post Graham patch repair of perforated ulcer with some irregularity but no extension beyond this area to suggest leak. This is likely the site of repair. Examination limited due to difficulty in patient positioning and the small volume that the patient was able to ingest for the examination. Electronically Signed   By: Zetta Bills M.D.   On:  09/09/2019 11:19    Anti-infectives: Anti-infectives (From admission, onward)   Start     Dose/Rate Route Frequency Ordered Stop   09/06/19 1600  anidulafungin (ERAXIS) 100 mg in sodium chloride 0.9 % 100 mL IVPB  Status:  Discontinued    Note to Pharmacy: Please change dosing to 100 mg after first day   100 mg 78 mL/hr over 100 Minutes Intravenous Every 24 hours 09/05/19 1148 09/05/19 1456   09/05/19 2000  piperacillin-tazobactam (ZOSYN) IVPB 3.375 g     3.375 g 12.5 mL/hr over 240 Minutes Intravenous Every 8 hours 09/05/19 1456     09/05/19 1700   piperacillin-tazobactam (ZOSYN) IVPB 3.375 g  Status:  Discontinued     3.375 g 12.5 mL/hr over 240 Minutes Intravenous Every 8 hours 09/05/19 1148 09/05/19 1456   09/05/19 1300  anidulafungin (ERAXIS) 200 mg in sodium chloride 0.9 % 200 mL IVPB  Status:  Discontinued     200 mg 78 mL/hr over 200 Minutes Intravenous  Once 09/05/19 1215 09/05/19 1456   09/05/19 1045  piperacillin-tazobactam (ZOSYN) IVPB 3.375 g     3.375 g 100 mL/hr over 30 Minutes Intravenous  Once 09/05/19 1042 09/05/19 1124       Assessment/Plan Acute renal insufficiency- resolved, after rehydration Elevated BP-add norvasc and prn lopressor  POD5, s/p ex lap with gastric biopsy, repair of perforated pyloric ulcer with graham patch for Pneumoperitoneum secondary to perforated ulcer, Dr. Kae Heller 1/21 - NS WD dressing changes BID to midline wound -adv to full liquids today -cont JP drain -mobilize TID -pulm toilet and IS -good pain control for now -INRstable. -check h pylori -path with no enough epithelial cells  FEN: FLD, IVFs to 50cc/hr VTE: lovenox ID: [email or web address]   LOS: 5 days    Henreitta Cea , [facility] Surgery 09/10/2019, 10:02 AM Please see Amion for pager number during day hours 7:00am-4:30pm or 7:00am -11:30am on weekends

## 2019-09-10 NOTE — TOC Initial Note (Addendum)
Transition of Care [facility]) - Initial/Assessment Note    Patient Details  Name: Albert Love MRN: [id number] Date of Birth: [birth year]  Transition of Care [facility]) CM/SW Contact:    Lia Hopping, [place] Phone Number: 09/10/2019, 12:41 PM  Clinical Narrative:                 CSW met with the patient at bedside to discuss establishing a PCP. Patient reports he recently moved to [place], [place] from [place]. He reports he was not seeing a PCP in [place] and would like to find one here in [place].CSW instructed the patient to call the number on back of his insurance card [facility]) and the representative will give him a list of physicians in the area. Patient reports "that's easy enough to do."  No other needs identified at this time.   Expected Discharge Plan: Home/Self Care Barriers to Discharge: No Barriers Identified   Patient Goals and CMS Choice     Choice offered to / list presented to : NA  Expected Discharge Plan and Services Expected Discharge Plan: Home/Self Care In-house Referral: Clinical Social Work Discharge Planning Services: CM Consult Post Acute Care Choice: NA Living arrangements for the past 2 months: Single Family Home                                      Prior Living Arrangements/Services Living arrangements for the past 2 months: Single Family Home Lives with:: Self Patient language and need for interpreter reviewed:: No Do you feel safe going back to the place where you live?: Yes      Need for Family Participation in Patient Care: Yes (Comment) Care giver support system in place?: No (comment)   Criminal Activity/Legal Involvement Pertinent to Current Situation/Hospitalization: No - Comment as needed  Activities of Daily Living Home Assistive Devices/Equipment: None ADL Screening (condition at time of admission) Patient's cognitive ability adequate to safely complete daily activities?: Yes Is the patient deaf or have difficulty  hearing?: No Does the patient have difficulty seeing, even when wearing glasses/contacts?: No Does the patient have difficulty concentrating, remembering, or making decisions?: No Patient able to express need for assistance with ADLs?: Yes Does the patient have difficulty dressing or bathing?: No Independently performs ADLs?: Yes (appropriate for developmental age) Does the patient have difficulty walking or climbing stairs?: No Weakness of Legs: None Weakness of Arms/Hands: None  Permission Sought/Granted                  Emotional Assessment Appearance:: Appears stated age Attitude/Demeanor/Rapport: Engaged Affect (typically observed): Accepting Orientation: : Oriented to Self, Oriented to Place, Oriented to  Time, Oriented to Situation Alcohol / Substance Use: Not Applicable Psych Involvement: No (comment)  Admission diagnosis:  Perforated abdominal viscus [R19.8] Perforated gastric ulcer ([place]) [K25.5] Patient Active Problem List   Diagnosis Date Noted  . Pneumoperitoneum 09/05/2019  . Acute renal insufficiency 09/05/2019  . Perforated gastric ulcer s/p omental [name] patch 09/05/2019 09/05/2019  . Coagulopathy ([place]) 09/05/2019   PCP:  Patient, No Pcp Per Pharmacy:   [id number]: [phone]: [phone]    Social Determinants of Health (SDOH) Interventions    Readmission Risk Interventions No flowsheet data found.

## 2019-09-11 ENCOUNTER — Inpatient Hospital Stay (HOSPITAL_COMMUNITY): Payer: Medicare PPO

## 2019-09-11 IMAGING — DX DG ABD PORTABLE 1V
1 series · 1 of 1 positions shown · non-contrast
Comparison: CT, [DATE].

CLINICAL DATA: Follow-up ileus.  Generalized abdominal pain today.

EXAM:
PORTABLE ABDOMEN - 1 VIEW

[abdomen supine]
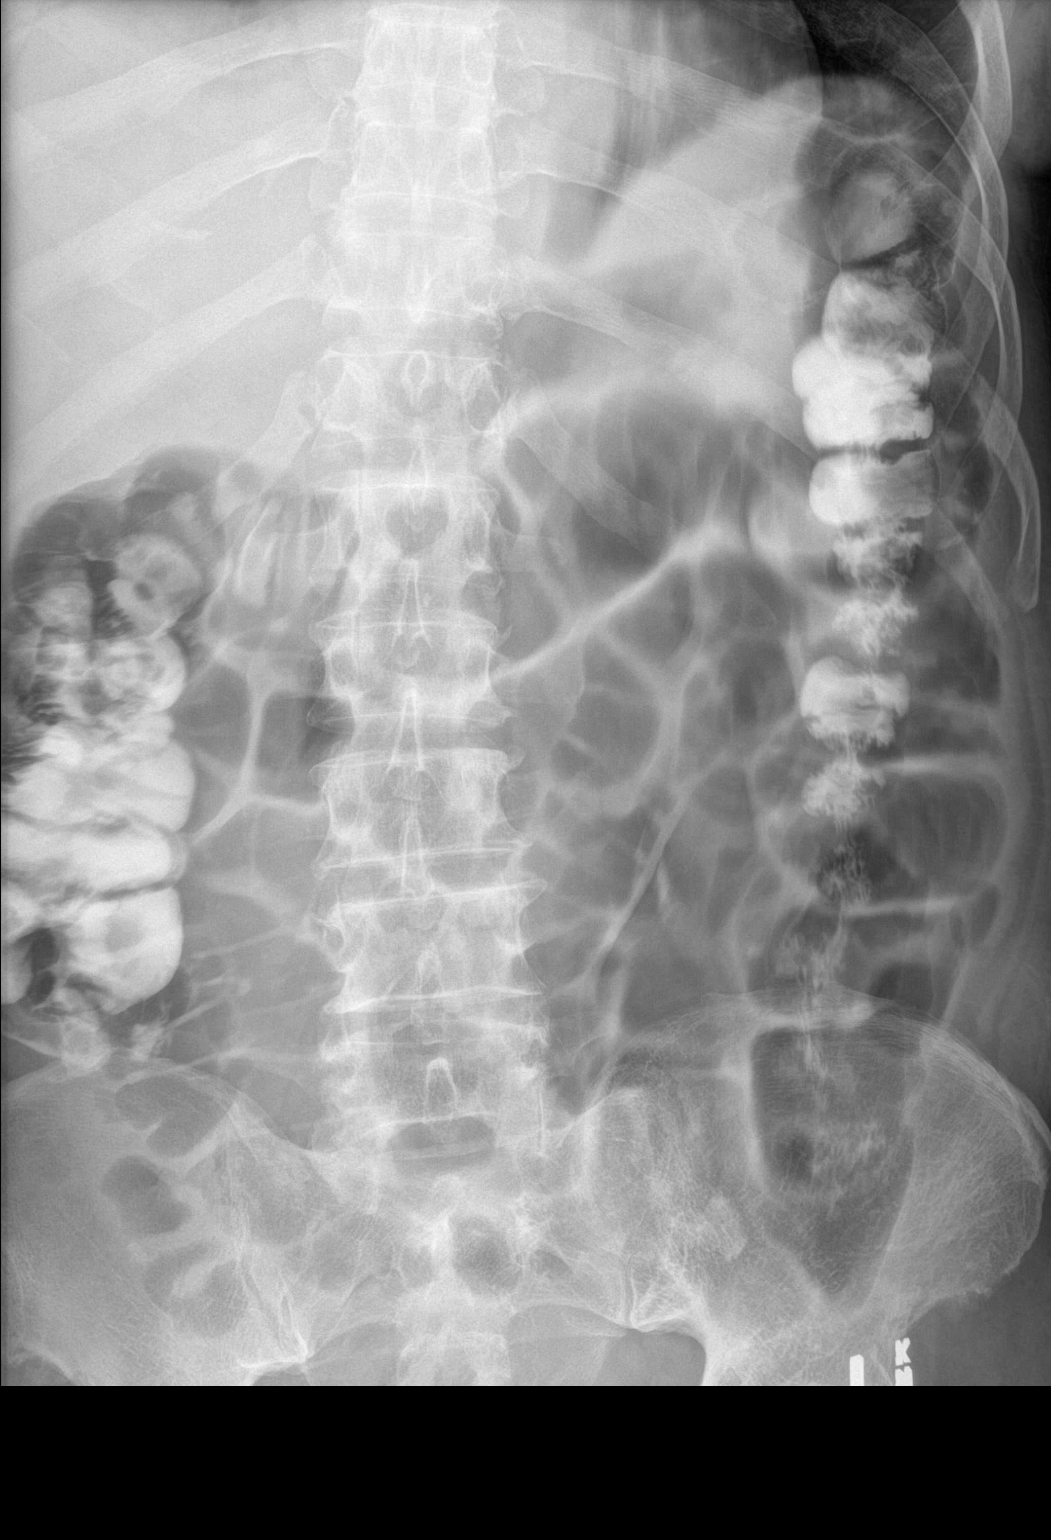

[1 of 1 positions shown; findings below may reference images not displayed]

FINDINGS: Residual contrast from an upper GI study lies within a nondilated
colon. There are loops of prominent, but not overtly dilated, small
bowel that are partly air-filled. No convincing bowel obstruction.
IMPRESSION: 1. Prominent partly air-filled small bowel without over dilation to
suggest obstruction. Contrast from an upper GI study dated
[DATE] is now visualized within the colon and rectum.

## 2019-09-11 MED ORDER — SIMETHICONE 80 MG PO CHEW
80.0000 mg | CHEWABLE_TABLET | Freq: Four times a day (QID) | ORAL | Status: DC
  Administered 2019-09-11 – 2019-09-15 (×18): 80 mg via ORAL
  Filled 2019-09-11 (×18): qty 1

## 2019-09-11 NOTE — Progress Notes (Signed)
Patient ID: Albert Love, male   DOB: [birth year], 79 y.o.   MRN: [id number]    6 Days Post-Op  Subjective: Patient still with a lot of hiccups.  He feels quite nauseated at times, but no emesis.  He is not taking in much at all in the way of liquids.  He is walking.  Still with some flatus.  No further BM since yesterday.  Pain seems controlled.  ROS: See above, otherwise other systems negative  Objective: Vital signs in last 24 hours: Temp:  [98.6 F (37 C)-99.6 F (37.6 C)] 98.8 F (37.1 C) (01/27 0501) Pulse Rate:  [86-102] 88 (01/27 0647) Resp:  [18] 18 (01/27 0501) BP: (146-175)/(90-100) 157/94 (01/27 0647) SpO2:  [93 %-97 %] 96 % (01/27 0501) Last BM Date: 09/03/19  Intake/Output from previous day: 01/26 0701 - 01/27 0700 In: 2124.5 [P.O.:1440; I.V.:595.7; IV Piggyback:88.8] Out: 3265 [Urine:3240; Drains:25] Intake/Output this shift: Total I/O In: -  Out: 300 [Urine:300]  PE: Heart: regular Lungs: CTAB Abd: soft, but distended, midline wound is clean and packed, hypoactive BS, appropriately tender  Lab Results:  No results for input(s): WBC, HGB, HCT, PLT in the last 72 hours. BMET No results for input(s): NA, K, CL, CO2, GLUCOSE, BUN, CREATININE, CALCIUM in the last 72 hours. PT/INR No results for input(s): LABPROT, INR in the last 72 hours. CMP     Component Value Date/Time   NA 134 (L) 09/07/2019 0431   K 3.9 09/07/2019 0431   CL 102 09/07/2019 0431   CO2 24 09/07/2019 0431   GLUCOSE 114 (H) 09/07/2019 0431   BUN 10 09/07/2019 0431   CREATININE 0.91 09/07/2019 0431   CALCIUM 7.7 (L) 09/07/2019 0431   PROT 5.1 (L) 09/06/2019 0002   ALBUMIN 2.7 (L) 09/06/2019 0002   AST 22 09/06/2019 0002   ALT 16 09/06/2019 0002   ALKPHOS 48 09/06/2019 0002   BILITOT 0.9 09/06/2019 0002   GFRNONAA >60 09/07/2019 0431   GFRAA >60 09/07/2019 0431   Lipase     Component Value Date/Time   LIPASE 21 09/05/2019 0828       Studies/Results: DG UGI W SINGLE CM  (SOL OR THIN BA)  Result Date: 09/09/2019 CLINICAL DATA:  Post Graham patch repair EXAM: WATER SOLUBLE UPPER GI SERIES TECHNIQUE: Single-column upper GI series was performed using water soluble contrast. CONTRAST:  Water-soluble contrast COMPARISON:  CT abdomen and pelvis of 09/05/2019 FLUOROSCOPY TIME:  Fluoroscopy Time:  4 minutes 54 seconds Radiation Exposure Index (if provided by the fluoroscopic device): 48.7 mGy Number of Acquired Spot Images: 7 FINDINGS: Water-soluble contrast was administered. The patient was not able to swallow large volumes and there was stasis in the esophagus. Attempts were made to is administered through the NG tube which passed directly in the small bowel. There is a subtle irregular outpouching along the distal portion of the stomach but there was no sign of extension beyond this area which is presumably the site of the [facility] patch repair best seen on oblique views. No sign of contrast passing into the drain during the examination. Multiple distended loops of bowel are noted. IMPRESSION: 1. Post Graham patch repair of perforated ulcer with some irregularity but no extension beyond this area to suggest leak. This is likely the site of repair. Examination limited due to difficulty in patient positioning and the small volume that the patient was able to ingest for the examination. Electronically Signed   By: Zetta Bills M.D.   On: 09/09/2019 11:19  Anti-infectives: Anti-infectives (From admission, onward)   Start     Dose/Rate Route Frequency Ordered Stop   09/06/19 1600  anidulafungin (ERAXIS) 100 mg in sodium chloride 0.9 % 100 mL IVPB  Status:  Discontinued    Note to Pharmacy: Please change dosing to 100 mg after first day   100 mg 78 mL/hr over 100 Minutes Intravenous Every 24 hours 09/05/19 1148 09/05/19 1456   09/05/19 2000  piperacillin-tazobactam (ZOSYN) IVPB 3.375 g     3.375 g 12.5 mL/hr over 240 Minutes Intravenous Every 8 hours 09/05/19 1456     09/05/19  1700  piperacillin-tazobactam (ZOSYN) IVPB 3.375 g  Status:  Discontinued     3.375 g 12.5 mL/hr over 240 Minutes Intravenous Every 8 hours 09/05/19 1148 09/05/19 1456   09/05/19 1300  anidulafungin (ERAXIS) 200 mg in sodium chloride 0.9 % 200 mL IVPB  Status:  Discontinued     200 mg 78 mL/hr over 200 Minutes Intravenous  Once 09/05/19 1215 09/05/19 1456   09/05/19 1045  piperacillin-tazobactam (ZOSYN) IVPB 3.375 g     3.375 g 100 mL/hr over 30 Minutes Intravenous  Once 09/05/19 1042 09/05/19 1124       Assessment/Plan Acute renal insufficiency- resolved, after rehydration Elevated BP-add norvasc and prn lopressor  POD6, s/p ex lap with gastric biopsy, repair of perforated pyloric ulcer with graham patch for Pneumoperitoneum secondary to perforated ulcer, Dr. Kae Heller 1/21 - NS WD dressing changes BID to midline wound -not eating much, bloated, and nauseated.  Will check plain film today to evaluate for ileus -JP drain DC yesterday on POD 5 -mobilize TID -pulm toilet and IS -good pain control for now -INRstable. -check h pylori -path with no enough epithelial cells  FEN:FLD (but may need to be backed off), IVFs to 50cc/hr VTE: lovenox ID: [email or web address], check labs in am given ileus type symptoms   LOS: 6 days    Henreitta Cea , [facility] Surgery 09/11/2019, 10:03 AM Please see Amion for pager number during day hours 7:00am-4:30pm or 7:00am -11:30am on weekends

## 2019-09-11 NOTE — Evaluation (Signed)
Occupational Therapy Evaluation Patient Details Name: Ramone Blumenstock MRN: [id number] DOB: 1940/09/13 Today's Date: 09/11/2019    History of Present Illness 79 yo male admitted with abdominal pain, perforated ulcer.  s/p exploratory laparotomy, gastric biopsy, omental pedicle flap and  repair of perforated pyloric ulcer   Clinical Impression   Pt was admitted for the above sx.  He recently moved from [place] to [place] to live with son, who works. All education was completed this session.  Pt would benefit from 3:1 over his low toilet to decrease strain.    Follow Up Recommendations  No OT follow up    Equipment Recommendations  3 in 1 bedside commode    Recommendations for Other Services       Precautions / Restrictions Precautions Precaution Comments: abd sx Restrictions Weight Bearing Restrictions: No      Mobility Bed Mobility               General bed mobility comments: supervision, cues for technique  Transfers   Equipment used: None   Sit to Stand: Supervision              Balance                                           ADL either performed or assessed with clinical judgement   ADL Overall ADL's : Needs assistance/impaired                                       General ADL Comments: pt is at a setup level for adls.  Educated on log roll for in/out of bed, and sidestepping over toilet.  Pt's toilet is low, he would benefit from 3:1 over this.  Performed bathing/dressing this am.  Pt requested to lie back down at end of session 2* not sleeping much last night     Vision         Perception     Praxis      Pertinent Vitals/Pain Faces Pain Scale: Hurts a little bit Pain Location: abd with activity Pain Descriptors / Indicators: Sore Pain Intervention(s): Limited activity within patient's tolerance;Monitored during session     Hand Dominance     Extremity/Trunk Assessment Upper Extremity Assessment Upper  Extremity Assessment: Overall WFL for tasks assessed           Communication Communication Communication: No difficulties   Cognition Arousal/Alertness: Awake/alert Behavior During Therapy: WFL for tasks assessed/performed Overall Cognitive Status: Within Functional Limits for tasks assessed                                     General Comments       Exercises     Shoulder Instructions      Home Living Family/patient expects to be discharged to:: Private residence Living Arrangements: Children           Home Layout: One level     Bathroom Shower/Tub: Teacher, early years/pre: Standard     Home Equipment: None          Prior Functioning/Environment Level of Independence: Independent                 OT Problem  List:        OT Treatment/Interventions:      OT Goals(Current goals can be found in the care plan section) Acute Rehab OT Goals Patient Stated Goal: home OT Goal Formulation: All assessment and education complete, DC therapy  OT Frequency:     Barriers to D/C:            Co-evaluation              AM-PAC OT "6 Clicks" Daily Activity     Outcome Measure Help from another person eating meals?: None Help from another person taking care of personal grooming?: A Little Help from another person toileting, which includes using toliet, bedpan, or urinal?: A Little Help from another person bathing (including washing, rinsing, drying)?: A Little Help from another person to put on and taking off regular upper body clothing?: A Little Help from another person to put on and taking off regular lower body clothing?: A Little 6 Click Score: 19   End of Session    Activity Tolerance: Patient tolerated treatment well Patient left: in bed;with call bell/phone within reach  OT Visit Diagnosis: Muscle weakness (generalized) (M62.81)                Time: [id number] OT Time Calculation (min): 23 min Charges:  OT General  Charges $OT Visit: 1 Visit OT Evaluation $OT Eval Low Complexity: 1 Low  Yuta Cipollone S, OTR/L Acute Rehabilitation Services 09/11/2019  [place] 09/11/2019, 10:27 AM

## 2019-09-11 NOTE — Progress Notes (Signed)
Physical Therapy Treatment Patient Details Name: Albert Love MRN: [id number] DOB: 05-Jun-1941 Today's Date: 09/11/2019    History of Present Illness 79 yo male admitted with abdominal pain, perforated ulcer.  s/p exploratory laparotomy, gastric biopsy, omental pedicle flap and  repair of perforated pyloric ulcer    PT Comments    Pt feeling "better" after his pain meds.  Assisted OOB to amb.  General Gait Details: amb w/o any AD this session.  Slow but good alternationg gait with increased distance. Returned to room and sat up in recliner performing self denture care.    Follow Up Recommendations  No PT follow up     Equipment Recommendations  None recommended by PT    Recommendations for Other Services       Precautions / Restrictions Precautions Precaution Comments: abd sx Restrictions Weight Bearing Restrictions: No    Mobility  Bed Mobility Overal bed mobility: Modified Independent             General bed mobility comments: increased time  Transfers Overall transfer level: Modified independent Equipment used: None             General transfer comment: increased time with good use of hands to steady self  Ambulation/Gait Ambulation/Gait assistance: Supervision;Min guard Gait Distance (Feet): 450 Feet Assistive device: None Gait Pattern/deviations: Step-through pattern Gait velocity: decreased   General Gait Details: amb w/o any AD this session.  Slow but good alternationg gait with increased distance.   Stairs             Wheelchair Mobility    Modified Rankin (Stroke Patients Only)       Balance                                            Cognition Arousal/Alertness: Awake/alert Behavior During Therapy: WFL for tasks assessed/performed Overall Cognitive Status: Within Functional Limits for tasks assessed                                        Exercises      General Comments         Pertinent Vitals/Pain Pain Assessment: Faces Faces Pain Scale: Hurts a little bit Pain Location: abd with activity Pain Descriptors / Indicators: Sore;Aching;Tender    Home Living                      Prior Function            PT Goals (current goals can now be found in the care plan section) Progress towards PT goals: Progressing toward goals    Frequency    Min 3X/week      PT Plan Current plan remains appropriate    Co-evaluation              AM-PAC PT "6 Clicks" Mobility   Outcome Measure  Help needed turning from your back to your side while in a flat bed without using bedrails?: None Help needed moving from lying on your back to sitting on the side of a flat bed without using bedrails?: None Help needed moving to and from a bed to a chair (including a wheelchair)?: None Help needed standing up from a chair using your arms (e.g., wheelchair or bedside chair)?: None Help  needed to walk in hospital room?: None Help needed climbing 3-5 steps with a railing? : A Little 6 Click Score: 23    End of Session Equipment Utilized During Treatment: Gait belt Activity Tolerance: Patient tolerated treatment well Patient left: in chair;with call bell/phone within reach Nurse Communication: Mobility status PT Visit Diagnosis: Difficulty in walking, not elsewhere classified (R26.2)     Time: [id number] PT Time Calculation (min) (ACUTE ONLY): 24 min  Charges:  $Gait Training: 8-22 mins $Therapeutic Activity: 8-22 mins                     Rica Koyanagi  PTA Acute  Rehabilitation Services Pager      [phone] Office      [phone]

## 2019-09-11 NOTE — Progress Notes (Signed)
Patient dressing changed to abdomen per dr order- cleansed wound and packed with ns and 4x4 gauze. Covered with abd pad and tape. Dressing applied to jp removal site. Both wound and jp removal site show no redness, drainage and no swelling, will continue to monitor.

## 2019-09-11 NOTE — Plan of Care (Signed)

## 2019-09-11 NOTE — Progress Notes (Signed)
Patient hiccup occurrence greatly decreased- no more c/o hiccups after 2 pm. Passed small amount of gas. Appetite is increasing per patient says " I feel like a brand new man". Ambulated in halls with minimal assist multiple times today.

## 2019-09-12 LAB — BASIC METABOLIC PANEL
Anion gap: 7 (ref 5–15)
BUN: 5 mg/dL — ABNORMAL LOW (ref 8–23)
CO2: 31 mmol/L (ref 22–32)
Calcium: 7.9 mg/dL — ABNORMAL LOW (ref 8.9–10.3)
Chloride: 97 mmol/L — ABNORMAL LOW (ref 98–111)
Creatinine, Ser: 0.93 mg/dL (ref 0.61–1.24)
GFR calc Af Amer: >60 mL/min (ref >60)
GFR calc non Af Amer: >60 mL/min (ref >60)
Glucose, Bld: 139 mg/dL — ABNORMAL HIGH (ref 70–99)
Potassium: 3.7 mmol/L (ref 3.5–5.1)
Sodium: 135 mmol/L (ref 135–145)

## 2019-09-12 LAB — H PYLORI, IGM, IGG, IGA AB
H Pylori IgG: 4.8 Index Value — ABNORMAL HIGH (ref 0.00–0.79)
H. Pylogi, Iga Abs: 14.1 units — ABNORMAL HIGH (ref 0.0–8.9)
H. Pylogi, Igm Abs: <9 units (ref 0.0–8.9)

## 2019-09-12 LAB — CBC
HCT: 40 % (ref 39.0–52.0)
Hemoglobin: 12.9 g/dL — ABNORMAL LOW (ref 13.0–17.0)
MCH: 26.5 pg (ref 26.0–34.0)
MCHC: 32.3 g/dL (ref 30.0–36.0)
MCV: 82.1 fL (ref 80.0–100.0)
Platelets: 180 10*3/uL (ref 150–400)
RBC: 4.87 MIL/uL (ref 4.22–5.81)
RDW: 14.1 % (ref 11.5–15.5)
WBC: 13.6 10*3/uL — ABNORMAL HIGH (ref 4.0–10.5)
nRBC: 0 % (ref 0.0–0.2)

## 2019-09-12 MED ORDER — POLYETHYLENE GLYCOL 3350 17 G PO PACK
17.0000 g | PACK | Freq: Two times a day (BID) | ORAL | Status: DC
  Administered 2019-09-14: 17 g via ORAL
  Filled 2019-09-12 (×5): qty 1

## 2019-09-12 NOTE — Progress Notes (Addendum)
Patient ID: Albert Love, male   DOB: [birth year], 79 y.o.   MRN: [id number]    7 Days Post-Op  Subjective: Patient still with some nausea and hiccups.  Unclear if nausea is secondary to pain meds on an empty stomach of ileus.  He is passing a lot of flatus and his abdomen is softer today.  Walking well.  ROS: See above, otherwise other systems negative  Objective: Vital signs in last 24 hours: Temp:  [98.1 F (36.7 C)-98.8 F (37.1 C)] 98.8 F (37.1 C) (01/28 0555) Pulse Rate:  [90-96] 93 (01/28 0555) Resp:  [16-17] 16 (01/28 0555) BP: (158-171)/(84-93) 158/85 (01/28 0555) SpO2:  [93 %-96 %] 96 % (01/28 0555) Last BM Date: 09/03/19  Intake/Output from previous day: 01/27 0701 - 01/28 0700 In: 2184.5 [P.O.:540; I.V.:1463; IV Piggyback:181.5] Out: 2040 [Urine:2040] Intake/Output this shift: Total I/O In: -  Out: 250 [Urine:250]  PE: Heart: regular Lungs: CTAB Abd: soft, , appropriately tender, midline wound is clean and packed, abdomen is softer today than yesterday, borborygmi present today.   Lab Results:  Recent Labs    09/12/19 0428  WBC 13.6*  HGB 12.9*  HCT 40.0  PLT 180   BMET Recent Labs    09/12/19 0428  NA 135  K 3.7  CL 97*  CO2 31  GLUCOSE 139*  BUN 5*  CREATININE 0.93  CALCIUM 7.9*   PT/INR No results for input(s): LABPROT, INR in the last 72 hours. CMP     Component Value Date/Time   NA 135 09/12/2019 0428   K 3.7 09/12/2019 0428   CL 97 (L) 09/12/2019 0428   CO2 31 09/12/2019 0428   GLUCOSE 139 (H) 09/12/2019 0428   BUN 5 (L) 09/12/2019 0428   CREATININE 0.93 09/12/2019 0428   CALCIUM 7.9 (L) 09/12/2019 0428   PROT 5.1 (L) 09/06/2019 0002   ALBUMIN 2.7 (L) 09/06/2019 0002   AST 22 09/06/2019 0002   ALT 16 09/06/2019 0002   ALKPHOS 48 09/06/2019 0002   BILITOT 0.9 09/06/2019 0002   GFRNONAA >60 09/12/2019 0428   GFRAA >60 09/12/2019 0428   Lipase     Component Value Date/Time   LIPASE 21 09/05/2019 0828        Studies/Results: DG Abd Portable 1V  Result Date: 09/11/2019 CLINICAL DATA:  Follow-up ileus.  Generalized abdominal pain today. EXAM: PORTABLE ABDOMEN - 1 VIEW COMPARISON:  CT, 09/05/2019. FINDINGS: Residual contrast from an upper GI study lies within a nondilated colon. There are loops of prominent, but not overtly dilated, small bowel that are partly air-filled. No convincing bowel obstruction. IMPRESSION: 1. Prominent partly air-filled small bowel without over dilation to suggest obstruction. Contrast from an upper GI study dated 09/09/2019 is now visualized within the colon and rectum. Electronically Signed   By: Lajean Manes M.D.   On: 09/11/2019 11:23    Anti-infectives: Anti-infectives (From admission, onward)   Start     Dose/Rate Route Frequency Ordered Stop   09/06/19 1600  anidulafungin (ERAXIS) 100 mg in sodium chloride 0.9 % 100 mL IVPB  Status:  Discontinued    Note to Pharmacy: Please change dosing to 100 mg after first day   100 mg 78 mL/hr over 100 Minutes Intravenous Every 24 hours 09/05/19 1148 09/05/19 1456   09/05/19 2000  piperacillin-tazobactam (ZOSYN) IVPB 3.375 g     3.375 g 12.5 mL/hr over 240 Minutes Intravenous Every 8 hours 09/05/19 1456     09/05/19 1700  piperacillin-tazobactam (ZOSYN) IVPB 3.375  g  Status:  Discontinued     3.375 g 12.5 mL/hr over 240 Minutes Intravenous Every 8 hours 09/05/19 1148 09/05/19 1456   09/05/19 1300  anidulafungin (ERAXIS) 200 mg in sodium chloride 0.9 % 200 mL IVPB  Status:  Discontinued     200 mg 78 mL/hr over 200 Minutes Intravenous  Once 09/05/19 1215 09/05/19 1456   09/05/19 1045  piperacillin-tazobactam (ZOSYN) IVPB 3.375 g     3.375 g 100 mL/hr over 30 Minutes Intravenous  Once 09/05/19 1042 09/05/19 1124       Assessment/Plan Acute renal insufficiency- resolved, after rehydration Elevated BP-add norvasc and prn lopressor, follow   POD7, s/p ex lap with gastric biopsy, repair of perforated pyloric  ulcer with graham patch for Pneumoperitoneum secondary to perforated ulcer, Dr. Kae Heller 1/21 - NS WD dressing changes daily to midline wound -bloating improving.  Did eat cream of potato soup yesterday.  Will let him try some soft diet today and see how he does. -JP drain DC yesterday on POD 5 -mobilize TID -pulm toilet and IS -good pain control for now -INRstable. -check h pylori -path with no enough epithelial cells -WBC down to 13K  [id number] diet, IVFsto 50cc/hr, add miralax VTE: lovenox ID: [email or web address], hopeful to DC today after 7 days   LOS: 7 days    Henreitta Cea , [facility] 09/12/2019, 9:20 AM Please see Amion for pager number during day hours 7:00am-4:30pm or 7:00am -11:30am on weekends

## 2019-09-12 NOTE — Care Management Important Message (Signed)
Important Message  Patient Details IM Letter given to Kathrin Greathouse SW Case Manager to present to the Patient Name: Albert Love MRN: [id number] Date of Birth: 1940/10/06   Medicare Important Message Given:  Yes     Kerin Salen 09/12/2019, 10:49 AM

## 2019-09-13 ENCOUNTER — Inpatient Hospital Stay (HOSPITAL_COMMUNITY): Payer: Medicare PPO

## 2019-09-13 LAB — CBC
HCT: 35.7 % — ABNORMAL LOW (ref 39.0–52.0)
Hemoglobin: 11.4 g/dL — ABNORMAL LOW (ref 13.0–17.0)
MCH: 26.3 pg (ref 26.0–34.0)
MCHC: 31.9 g/dL (ref 30.0–36.0)
MCV: 82.3 fL (ref 80.0–100.0)
Platelets: 154 10*3/uL (ref 150–400)
RBC: 4.34 MIL/uL (ref 4.22–5.81)
RDW: 14.2 % (ref 11.5–15.5)
WBC: 14.3 10*3/uL — ABNORMAL HIGH (ref 4.0–10.5)
nRBC: 0 % (ref 0.0–0.2)

## 2019-09-13 LAB — BASIC METABOLIC PANEL
Anion gap: 7 (ref 5–15)
BUN: <5 mg/dL — ABNORMAL LOW (ref 8–23)
CO2: 28 mmol/L (ref 22–32)
Calcium: 7.6 mg/dL — ABNORMAL LOW (ref 8.9–10.3)
Chloride: 101 mmol/L (ref 98–111)
Creatinine, Ser: 0.72 mg/dL (ref 0.61–1.24)
GFR calc Af Amer: >60 mL/min (ref >60)
GFR calc non Af Amer: >60 mL/min (ref >60)
Glucose, Bld: 121 mg/dL — ABNORMAL HIGH (ref 70–99)
Potassium: 3.1 mmol/L — ABNORMAL LOW (ref 3.5–5.1)
Sodium: 136 mmol/L (ref 135–145)

## 2019-09-13 LAB — H. PYLORI ANTIBODY, IGG: H Pylori IgG: 4.66 Index Value — ABNORMAL HIGH (ref 0.00–0.79)

## 2019-09-13 IMAGING — CT CT ABD-PELV W/ CM
1 of 3 series · 10 of 36 positions shown, 12 images · IV contrast (OMNIPAQUE)
Comparison: [DATE]
COMPARISON: [DATE]

Addendum:
CLINICAL DATA: PETERRMARTEN, suspected abdominal abscess.

EXAM:
CT ABDOMEN AND PELVIS WITH CONTRAST
TECHNIQUE: Multidetector CT imaging of the abdomen and pelvis was performed
using the standard protocol following bolus administration of
intravenous contrast.
CONTRAST:  100mL OMNIPAQUE IOHEXOL 300 MG/ML  SOLN

[Series 7: delay · axial · delayed · 0.75mm/px · z∈[-494,-298]mm · 10 of 47 slices shown, 12 images]
[im 4/47  soft-tissue]
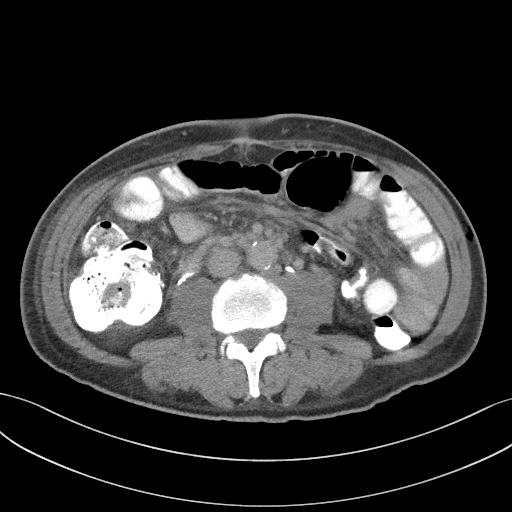
[im 4/47  bone]
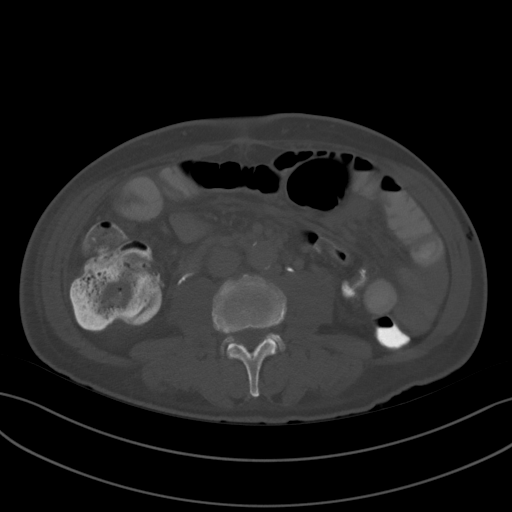
[im 8/47  soft-tissue]
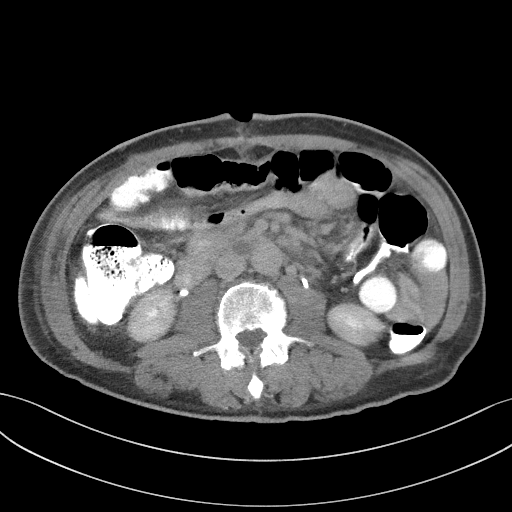
[im 12/47  soft-tissue]
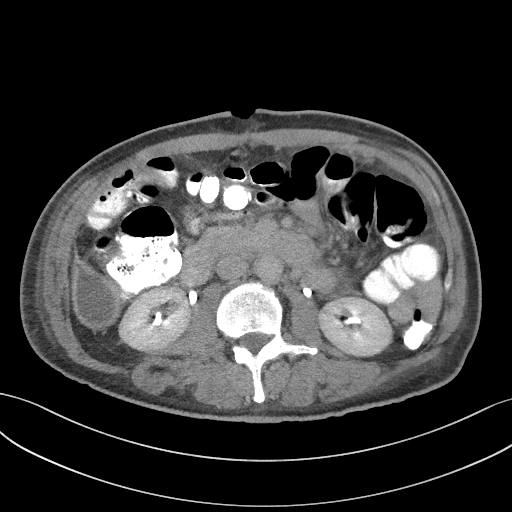
[im 16/47  soft-tissue]
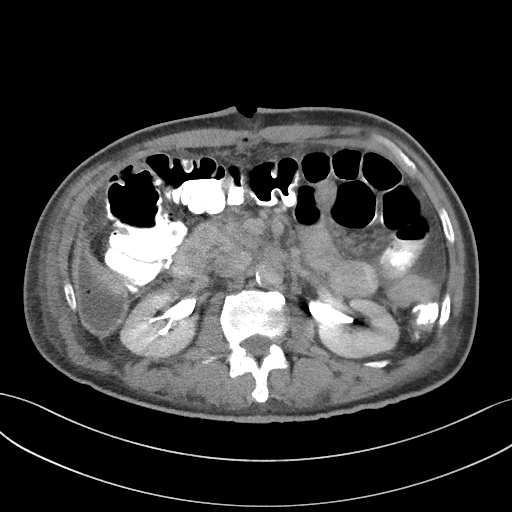
[im 20/47  soft-tissue]
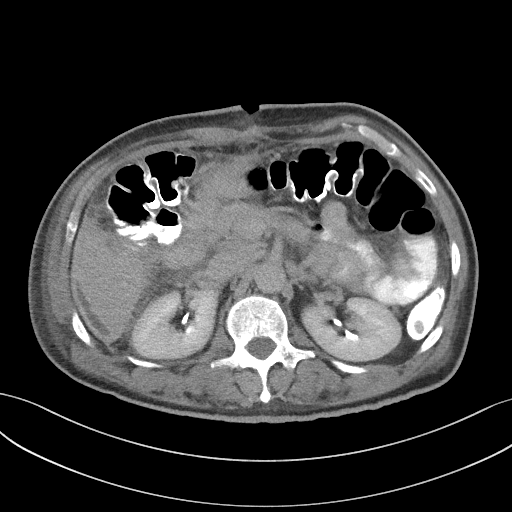
[im 27/47  soft-tissue]
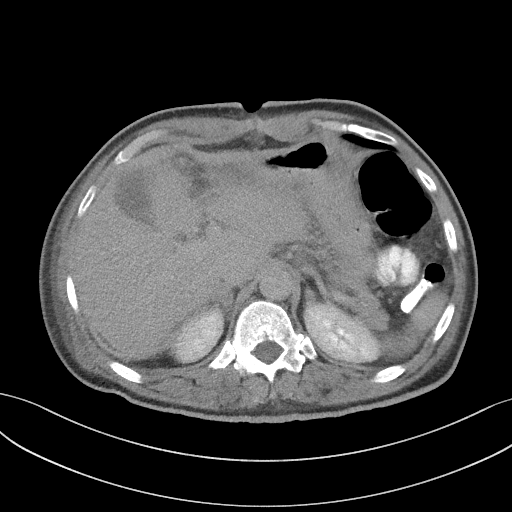
[im 31/47  soft-tissue]
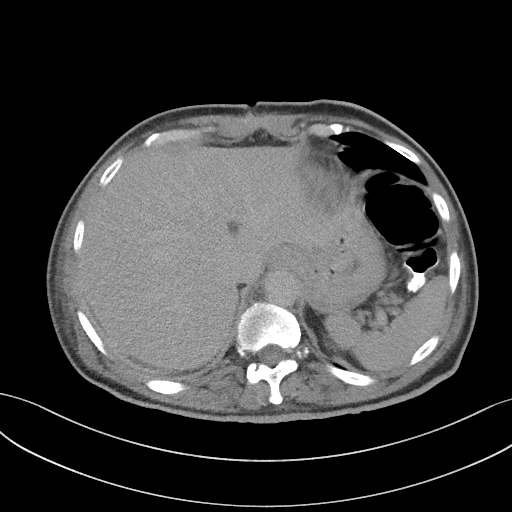
[im 35/47  soft-tissue]
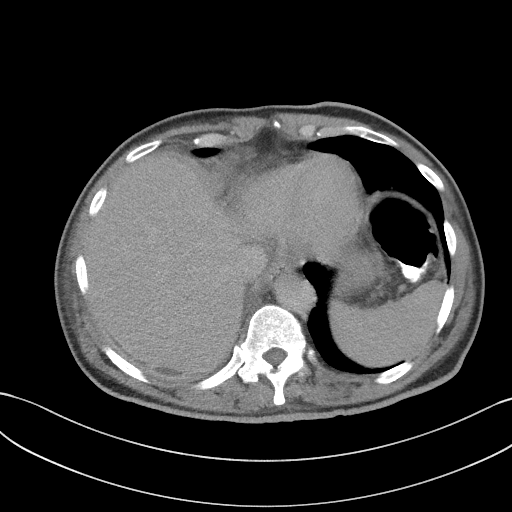
[im 39/47  soft-tissue]
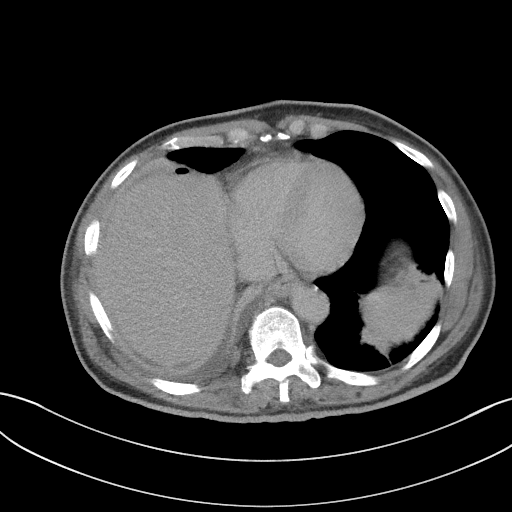
[im 39/47  bone]
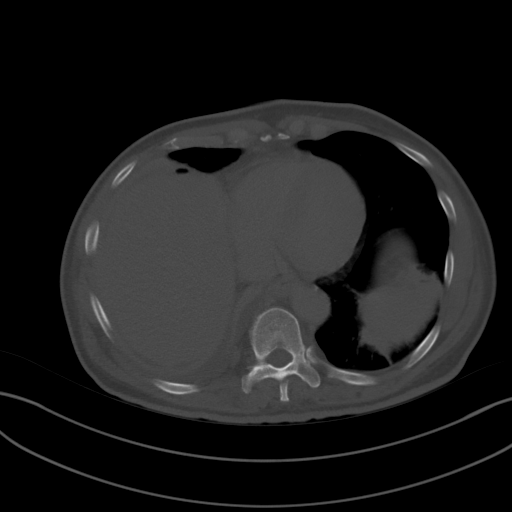
[im 43/47  soft-tissue]
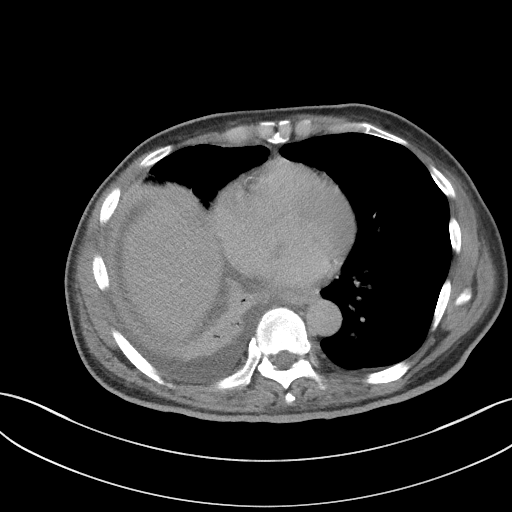

[10 of 36 positions shown; findings below may reference images not displayed]

FINDINGS: Lower chest: Small right-sided pleural effusion is with basilar
atelectasis. Volume loss on the left.

Mildly heterogeneous enhancement of volume loss in the right chest.

Hepatobiliary: Small area of low attenuation in the central liver
left hepatic lobe likely small cysts. Lenticular fluid collection
mixed with gas in the high right pericolic gutter tracking along the
liver margin and above the right hepatic margin with a small locule
of gas seen anterior to the liver likely within the superior margin
of this collection in the subdiaphragmatic portion collection
measures 6.5 x 3.0 x 5.3 cm. Portal vein is patent into the liver.

Pancreas: Pancreas is normal, no sign of suspicious lesion. There is
some surrounding stranding, presumably related to recent
postoperative change and perforated ulcer.

Spleen: Spleen is normal size without focal lesion.

Adrenals/Urinary Tract: Adrenal glands with right adrenal thickening
up to a cm of the body of the adrenal. Mild nodularity of the left
adrenal.

Signs of symmetric renal enhancement. No signs of hydronephrosis.

Stomach/Bowel: Marked gastric edema. Irregularity at the site of
previous perforation. Abundant fat in the lesser sac at the site of
omental patch. Small, focal outpouching along the gastric antrum
measures approximately 0.9 x 0.8 cm and is surrounded by fat with
some edema in the fat. No focal fluid or gas tracking beyond this
location. No suggestion of extraluminal contrast following oral
contrast administration.

No signs of small bowel obstruction but with mild to moderate
distension of small bowel loops

Stranding about bowel loops in the pelvis is, associated with a
pelvic collection showing peripheral enhancement. This collection
measures 6.1 x 4.9 x 5.2 cm.

Colon is predominantly contrast filled with areas of alternating
under distension and dilation. There is irregularity of the cecum,
cecum is better distended on today's study than it was on the
initial scan of [DATE]. Thickening of the cecum below the
ileocecal valve, wall thickness suggested up to 1.7 cm. Significance
uncertain on today's study given surrounding inflammation though
this appears semi annular.

Vascular/Lymphatic: Patent abdominal vasculature. No sign of
adenopathy in the upper abdomen or retroperitoneum.

No signs of pelvic lymphadenopathy.

Reproductive: Prostatomegaly with heterogeneity, nonspecific finding
on CT. Prostate fills the lower pelvis measuring 5.5 by 5.9 cm.

Other: Midline postoperative changes with dressing over the anterior
abdominal wall, healing via secondary intention.

Small locule of gas deep to the midline incision only 4 mm without
fluid along the midline incision or significant free intraperitoneal
air. Gas seen on outside the bowel appears to be contained within
abdominal collections which are described above.

Musculoskeletal: Signs of Paget's disease particularly in the left
hemipelvis with coarsened trabeculation. No acute bone finding or
destructive bone process.
IMPRESSION: 1. Peripherally enhancing fluid collections in the pelvis and
inferior to liver tracking along the right hemi liver, suspicious
for infected collection/abscess following previous gastric
perforation.
2. Thickening of the cecum below the ileocecal valve, wall thickness
suggested up to 1.7 cm. Significance uncertain on today's study
given surrounding inflammation. This is however suspicious for
colonic neoplasm, suggest follow-up colonoscopy or correlation with
previous colonoscopy results if recent and available.
3. Tiny locule of gas along the anterior abdomen without significant
free air.
4. Signs of previous PETERRMARTEN with irregularity along the lesser
curvature of the stomach., small focal outpouching also seen on the
previous upper GI compatible with site of perforation and overlying
PETERRMARTEN.
5. Small right pleural effusion with heterogeneous enhancement of
collapsed lung raising the question of aspiration or developing
pneumonia.
6. Appendix shown to be enhancing and with signs of presumed
inflammation on the prior study. Appendix obscured on today's exam
by surrounding inflammation. Attention on follow-up.
7. Signs of atelectasis in the left chest.
8. Signs of Paget's disease particularly in the left hemipelvis.
9. Prostatomegaly.
10. These results were called by telephone at the time of
interpretation on [DATE] at [DATE] to provider PETERRMARTEN ,
who verbally acknowledged these results.

ADDENDUM:
Right adrenal lesion slightly greater than a cm. Consider follow-up
adrenal protocol CT when the patient is able, within 1 year. This
recommendation follows ACR consensus guidelines: Management of
Incidental Adrenal Masses: A White Paper of the ACR Incidental
Findings Committee. [HOSPITAL] [UO];14:[PHONE_NUMBER].

*** End of Addendum ***
FINDINGS: Lower chest: Small right-sided pleural effusion is with basilar
atelectasis. Volume loss on the left.

Mildly heterogeneous enhancement of volume loss in the right chest.

Hepatobiliary: Small area of low attenuation in the central liver
left hepatic lobe likely small cysts. Lenticular fluid collection
mixed with gas in the high right pericolic gutter tracking along the
liver margin and above the right hepatic margin with a small locule
of gas seen anterior to the liver likely within the superior margin
of this collection in the subdiaphragmatic portion collection
measures 6.5 x 3.0 x 5.3 cm. Portal vein is patent into the liver.

Pancreas: Pancreas is normal, no sign of suspicious lesion. There is
some surrounding stranding, presumably related to recent
postoperative change and perforated ulcer.

Spleen: Spleen is normal size without focal lesion.

Adrenals/Urinary Tract: Adrenal glands with right adrenal thickening
up to a cm of the body of the adrenal. Mild nodularity of the left
adrenal.

Signs of symmetric renal enhancement. No signs of hydronephrosis.

Stomach/Bowel: Marked gastric edema. Irregularity at the site of
previous perforation. Abundant fat in the lesser sac at the site of
omental patch. Small, focal outpouching along the gastric antrum
measures approximately 0.9 x 0.8 cm and is surrounded by fat with
some edema in the fat. No focal fluid or gas tracking beyond this
location. No suggestion of extraluminal contrast following oral
contrast administration.

No signs of small bowel obstruction but with mild to moderate
distension of small bowel loops

Stranding about bowel loops in the pelvis is, associated with a
pelvic collection showing peripheral enhancement. This collection
measures 6.1 x 4.9 x 5.2 cm.

Colon is predominantly contrast filled with areas of alternating
under distension and dilation. There is irregularity of the cecum,
cecum is better distended on today's study than it was on the
initial scan of [DATE]. Thickening of the cecum below the
ileocecal valve, wall thickness suggested up to 1.7 cm. Significance
uncertain on today's study given surrounding inflammation though
this appears semi annular.

Vascular/Lymphatic: Patent abdominal vasculature. No sign of
adenopathy in the upper abdomen or retroperitoneum.

No signs of pelvic lymphadenopathy.

Reproductive: Prostatomegaly with heterogeneity, nonspecific finding
on CT. Prostate fills the lower pelvis measuring 5.5 by 5.9 cm.

Other: Midline postoperative changes with dressing over the anterior
abdominal wall, healing via secondary intention.

Small locule of gas deep to the midline incision only 4 mm without
fluid along the midline incision or significant free intraperitoneal
air. Gas seen on outside the bowel appears to be contained within
abdominal collections which are described above.

Musculoskeletal: Signs of Paget's disease particularly in the left
hemipelvis with coarsened trabeculation. No acute bone finding or
destructive bone process.
IMPRESSION: 1. Peripherally enhancing fluid collections in the pelvis and
inferior to liver tracking along the right hemi liver, suspicious
for infected collection/abscess following previous gastric
perforation.
2. Thickening of the cecum below the ileocecal valve, wall thickness
suggested up to 1.7 cm. Significance uncertain on today's study
given surrounding inflammation. This is however suspicious for
colonic neoplasm, suggest follow-up colonoscopy or correlation with
previous colonoscopy results if recent and available.
3. Tiny locule of gas along the anterior abdomen without significant
free air.
4. Signs of previous PETERRMARTEN with irregularity along the lesser
curvature of the stomach., small focal outpouching also seen on the
previous upper GI compatible with site of perforation and overlying
PETERRMARTEN.
5. Small right pleural effusion with heterogeneous enhancement of
collapsed lung raising the question of aspiration or developing
pneumonia.
6. Appendix shown to be enhancing and with signs of presumed
inflammation on the prior study. Appendix obscured on today's exam
by surrounding inflammation. Attention on follow-up.
7. Signs of atelectasis in the left chest.
8. Signs of Paget's disease particularly in the left hemipelvis.
9. Prostatomegaly.
10. These results were called by telephone at the time of
interpretation on [DATE] at [DATE] to provider PETERRMARTEN ,
who verbally acknowledged these results.

## 2019-09-13 MED ORDER — BISMUTH SUBSALICYLATE 262 MG/15ML PO SUSP
30.0000 mL | Freq: Three times a day (TID) | ORAL | Status: DC
  Administered 2019-09-13 – 2019-09-20 (×24): 30 mL via ORAL
  Filled 2019-09-13 (×3): qty 236

## 2019-09-13 MED ORDER — IOHEXOL 9 MG/ML PO SOLN
500.0000 mL | ORAL | Status: AC
  Administered 2019-09-13: 500 mL via ORAL

## 2019-09-13 MED ORDER — ENOXAPARIN SODIUM 40 MG/0.4ML ~~LOC~~ SOLN
40.0000 mg | SUBCUTANEOUS | Status: DC
  Administered 2019-09-15 – 2019-09-20 (×6): 40 mg via SUBCUTANEOUS
  Filled 2019-09-13 (×7): qty 0.4

## 2019-09-13 MED ORDER — METRONIDAZOLE 500 MG PO TABS
500.0000 mg | ORAL_TABLET | Freq: Three times a day (TID) | ORAL | Status: DC
  Administered 2019-09-13 – 2019-09-20 (×21): 500 mg via ORAL
  Filled 2019-09-13 (×22): qty 1

## 2019-09-13 MED ORDER — PANTOPRAZOLE SODIUM 40 MG PO TBEC
40.0000 mg | DELAYED_RELEASE_TABLET | Freq: Two times a day (BID) | ORAL | Status: DC
  Administered 2019-09-13 – 2019-09-20 (×15): 40 mg via ORAL
  Filled 2019-09-13 (×15): qty 1

## 2019-09-13 MED ORDER — TETRACYCLINE HCL 250 MG PO CAPS
500.0000 mg | ORAL_CAPSULE | Freq: Four times a day (QID) | ORAL | Status: DC
  Administered 2019-09-13 – 2019-09-19 (×23): 500 mg via ORAL
  Filled 2019-09-13 (×31): qty 2

## 2019-09-13 MED ORDER — POTASSIUM CHLORIDE CRYS ER 20 MEQ PO TBCR
40.0000 meq | EXTENDED_RELEASE_TABLET | Freq: Two times a day (BID) | ORAL | Status: AC
  Administered 2019-09-13 (×2): 40 meq via ORAL
  Filled 2019-09-13 (×2): qty 2

## 2019-09-13 MED ORDER — SODIUM CHLORIDE (PF) 0.9 % IJ SOLN
INTRAMUSCULAR | Status: AC
  Filled 2019-09-13: qty 50

## 2019-09-13 MED ORDER — IOHEXOL 9 MG/ML PO SOLN
ORAL | Status: AC
  Filled 2019-09-13: qty 1000

## 2019-09-13 MED ORDER — IOHEXOL 300 MG/ML  SOLN
100.0000 mL | Freq: Once | INTRAMUSCULAR | Status: AC | PRN
  Administered 2019-09-13: 100 mL via INTRAVENOUS

## 2019-09-13 MED ORDER — PIPERACILLIN-TAZOBACTAM 3.375 G IVPB
3.3750 g | Freq: Three times a day (TID) | INTRAVENOUS | Status: AC
  Administered 2019-09-13 – 2019-09-18 (×16): 3.375 g via INTRAVENOUS
  Filled 2019-09-13 (×16): qty 50

## 2019-09-13 NOTE — Progress Notes (Signed)
CT scan report shows abscess over his liver and in his pelvis.  We will ask IR to evaluate him for drainage of these two locations.  There was also a concern for possible neoplasm in his right colon on his scan.  He has a referral in place for GI follow up as an outpatient already for an upper endo in 6 weeks after surgery.  We will refer for colonoscope as well.  All of this was discussed with patient.  Albert Love 12:48 PM 09/13/2019

## 2019-09-13 NOTE — Progress Notes (Signed)
Patient requested I call his son, [name], to give him an update on all the new findings.  I have done this and his son is very appreciative of this information.  Henreitta Cea 1:41 PM 09/13/2019

## 2019-09-13 NOTE — Progress Notes (Signed)
Physical Therapy Treatment Patient Details Name: Albert Love MRN: [id number] DOB: 15-May-1941 Today's Date: 09/13/2019    History of Present Illness 79 yo male admitted with abdominal pain, perforated ulcer.  s/p exploratory laparotomy, gastric biopsy, omental pedicle flap and  repair of perforated pyloric ulcer    PT Comments    Pt drinking contrast for a test later.  Having increased ABD pain.  General Gait Details: decreased amb distance today due to increased c/o ABD pain "it comes in waves" stated the pt.  Required light assist from IV pole and hall rails.   Follow Up Recommendations  No PT follow up     Equipment Recommendations  None recommended by PT    Recommendations for Other Services       Precautions / Restrictions Precautions Precaution Comments: abd sx Restrictions Weight Bearing Restrictions: No    Mobility  Bed Mobility               General bed mobility comments: OOB in recliner  Transfers Overall transfer level: Modified independent Equipment used: None Transfers: Sit to/from [facility] Sit to Stand: Supervision Stand pivot transfers: Supervision       General transfer comment: increased time with good use of hands to steady self  Ambulation/Gait Ambulation/Gait assistance: Supervision;Min guard Gait Distance (Feet): 45 Feet Assistive device: IV Pole Gait Pattern/deviations: Step-to pattern;Trunk flexed Gait velocity: decreased   General Gait Details: decreased amb distance today due to increased c/o ABD pain "it comes in waves" stated the pt.  Required light assist from IV pole and hall rails.   Stairs             Wheelchair Mobility    Modified Rankin (Stroke Patients Only)       Balance                                            Cognition Arousal/Alertness: Awake/alert Behavior During Therapy: WFL for tasks assessed/performed Overall Cognitive Status: Within Functional Limits  for tasks assessed                                 General Comments: very willing and motivated      Exercises      General Comments        Pertinent Vitals/Pain Pain Assessment: Faces Faces Pain Scale: Hurts even more Pain Location: abd with activity Pain Descriptors / Indicators: Sore;Aching;Tender Pain Intervention(s): Monitored during session;Repositioned    Home Living                      Prior Function            PT Goals (current goals can now be found in the care plan section) Progress towards PT goals: Progressing toward goals    Frequency    Min 3X/week      PT Plan Current plan remains appropriate    Co-evaluation              AM-PAC PT "6 Clicks" Mobility   Outcome Measure  Help needed turning from your back to your side while in a flat bed without using bedrails?: A Little Help needed moving from lying on your back to sitting on the side of a flat bed without using bedrails?: A Little Help needed moving  to and from a bed to a chair (including a wheelchair)?: A Little Help needed standing up from a chair using your arms (e.g., wheelchair or bedside chair)?: A Little Help needed to walk in hospital room?: A Little Help needed climbing 3-5 steps with a railing? : A Little 6 Click Score: 18    End of Session Equipment Utilized During Treatment: Gait belt Activity Tolerance: Patient limited by pain Patient left: with call bell/phone within reach;Other (comment)(left pt in bathroom but notified NT) Nurse Communication: Mobility status PT Visit Diagnosis: Difficulty in walking, not elsewhere classified (R26.2)     Time: [id number] PT Time Calculation (min) (ACUTE ONLY): 25 min  Charges:  $Gait Training: 8-22 mins $Therapeutic Activity: 8-22 mins                     {Anakaren Campion  PTA Acute  Rehabilitation Services Pager      [phone] Office      [phone]

## 2019-09-13 NOTE — Progress Notes (Signed)
Patient ID: Albert Love, male   DOB: 1941/04/28, 79 y.o.   MRN: [id number]    8 Days Post-Op  Subjective: Patient doesn't feel well today.  Gives me the thumbs down sign.  Still with hiccups.  Having increasing abdominal pain.  Tried to eating some breakfast this morning and had worsening pain.      ROS: See above, otherwise other systems negative  Objective: Vital signs in last 24 hours: Temp:  [98.4 F (36.9 C)-99.1 F (37.3 C)] 98.4 F (36.9 C) (01/29 0553) Pulse Rate:  [59-109] 94 (01/29 0553) Resp:  [16-20] 20 (01/29 0553) BP: (134-160)/(75-114) 138/75 (01/29 0553) SpO2:  [95 %] 95 % (01/29 0553) Last BM Date: 09/12/19  Intake/Output from previous day: 01/28 0701 - 01/29 0700 In: 2002.8 [P.O.:480; I.V.:1472.8; IV Piggyback:50] Out: 2170 [Urine:2170] Intake/Output this shift: Total I/O In: 120 [P.O.:120] Out: 200 [Urine:200]  PE: Heart: regular Lungs: CTAB Abd: soft, but still mildly bloated, +BS, slightly more tender today.  Midline incision is packed and clean.    Lab Results:  Recent Labs    09/12/19 0428 09/13/19 0413  WBC 13.6* 14.3*  HGB 12.9* 11.4*  HCT 40.0 35.7*  PLT 180 154   BMET Recent Labs    09/12/19 0428 09/13/19 0413  NA 135 136  K 3.7 3.1*  CL 97* 101  CO2 31 28  GLUCOSE 139* 121*  BUN 5* <5*  CREATININE 0.93 0.72  CALCIUM 7.9* 7.6*   PT/INR No results for input(s): LABPROT, INR in the last 72 hours. CMP     Component Value Date/Time   NA 136 09/13/2019 0413   K 3.1 (L) 09/13/2019 0413   CL 101 09/13/2019 0413   CO2 28 09/13/2019 0413   GLUCOSE 121 (H) 09/13/2019 0413   BUN <5 (L) 09/13/2019 0413   CREATININE 0.72 09/13/2019 0413   CALCIUM 7.6 (L) 09/13/2019 0413   PROT 5.1 (L) 09/06/2019 0002   ALBUMIN 2.7 (L) 09/06/2019 0002   AST 22 09/06/2019 0002   ALT 16 09/06/2019 0002   ALKPHOS 48 09/06/2019 0002   BILITOT 0.9 09/06/2019 0002   GFRNONAA >60 09/13/2019 0413   GFRAA >60 09/13/2019 0413   Lipase      Component Value Date/Time   LIPASE 21 09/05/2019 0828       Studies/Results: DG Abd Portable 1V  Result Date: 09/11/2019 CLINICAL DATA:  Follow-up ileus.  Generalized abdominal pain today. EXAM: PORTABLE ABDOMEN - 1 VIEW COMPARISON:  CT, 09/05/2019. FINDINGS: Residual contrast from an upper GI study lies within a nondilated colon. There are loops of prominent, but not overtly dilated, small bowel that are partly air-filled. No convincing bowel obstruction. IMPRESSION: 1. Prominent partly air-filled small bowel without over dilation to suggest obstruction. Contrast from an upper GI study dated 09/09/2019 is now visualized within the colon and rectum. Electronically Signed   By: Lajean Manes M.D.   On: 09/11/2019 11:23    Anti-infectives: Anti-infectives (From admission, onward)   Start     Dose/Rate Route Frequency Ordered Stop   09/13/19 1400  metroNIDAZOLE (FLAGYL) tablet 500 mg     500 mg Oral Every 8 hours 09/13/19 1041     09/13/19 1045  tetracycline (SUMYCIN) capsule 500 mg     500 mg Oral 4 times daily 09/13/19 1041     09/06/19 1600  anidulafungin (ERAXIS) 100 mg in sodium chloride 0.9 % 100 mL IVPB  Status:  Discontinued    Note to Pharmacy: Please change dosing to  100 mg after first day   100 mg 78 mL/hr over 100 Minutes Intravenous Every 24 hours 09/05/19 1148 09/05/19 1456   09/05/19 2000  piperacillin-tazobactam (ZOSYN) IVPB 3.375 g  Status:  Discontinued     3.375 g 12.5 mL/hr over 240 Minutes Intravenous Every 8 hours 09/05/19 1456 09/12/19 0926   09/05/19 1700  piperacillin-tazobactam (ZOSYN) IVPB 3.375 g  Status:  Discontinued     3.375 g 12.5 mL/hr over 240 Minutes Intravenous Every 8 hours 09/05/19 1148 09/05/19 1456   09/05/19 1300  anidulafungin (ERAXIS) 200 mg in sodium chloride 0.9 % 200 mL IVPB  Status:  Discontinued     200 mg 78 mL/hr over 200 Minutes Intravenous  Once 09/05/19 1215 09/05/19 1456   09/05/19 1045  piperacillin-tazobactam (ZOSYN) IVPB 3.375  g     3.375 g 100 mL/hr over 30 Minutes Intravenous  Once 09/05/19 1042 09/05/19 1124       Assessment/Plan Acute renal insufficiency- resolved, after rehydration Elevated BP-add norvasc and prn lopressor, follow   POD8, s/p ex lap with gastric biopsy, repair of perforated pyloric ulcer with graham patch for Pneumoperitoneum secondary to perforated ulcer, Dr. Kae Heller 1/21 - NS WD dressing changes daily to midline wound -not progressing as expected at this point with slight increasein WBC as well to 14.6K today.  Given worsening pain and not tolerating a diet we will order a CT scan today to evaluate for post op complication. -JP drain DC yesterday on POD 5 -mobilize TID -pulm toilet and IS -H. Pylori POSITIVE, will start on pylera (quadruple therapy per GI recommendations.  Triple therapy is no longer adequate).  We do not have pylera on formulary here so I have written for each part separately, but upon discharge he can be placed on omeprazole and pylera to complete a 14 day course.  D1/14 -will need referral to GI as outpatient.  Will contact office to work on setting this up for an endo in about 6 weeks  [id number] diet, IVFsto 50cc/hr, may need to decrease diet VTE: lovenox ID: zosyn DC 1/28, Pepto, flagyl, tetra 1/29 -->14 days   LOS: 8 days    Henreitta Cea , [facility] 09/13/2019, 10:41 AM Please see Amion for pager number during day hours 7:00am-4:30pm or 7:00am -11:30am on weekends

## 2019-09-13 NOTE — Consult Note (Signed)
Chief Complaint: Patient was seen in consultation today for CT-guided aspiration /possible drainage of abdominal/pelvic fluid collections Chief Complaint  Patient presents with  . Abdominal Pain  . Nausea    Referring Physician(s): Gross,S  Supervising Physician: Sandi Mariscal  Patient Status: [facility] - In-pt  History of Present Illness: Albert Love is a 79 y.o. male who was admitted to [facility] on 1/21 with abdominal pain and nausea as well as associated dyspnea.  Subsequent CT scan of abdomen pelvis revealed perforation of the duodenum with associated free intraperitoneal air and fluid.  He underwent exploratory laparotomy, gastric biopsy, omental pedicle flap/Graham patch repair of perforated pyloric ulcer on 09/05/2019.  Follow-up CT abdomen pelvis on 1/29 revealed: 1. Peripherally enhancing fluid collections in the pelvis and inferior to liver tracking along the right hemi liver, suspicious for infected collection/abscess following previous gastric perforation. 2. Thickening of the cecum below the ileocecal valve, wall thickness suggested up to 1.7 cm. Significance uncertain on today's study given surrounding inflammation. This is however suspicious for colonic neoplasm, suggest follow-up colonoscopy or correlation with previous colonoscopy results if recent and available. 3. Tiny locule of gas along the anterior abdomen without significant free air. 4. Signs of previous Graham patch with irregularity along the lesser curvature of the stomach., small focal outpouching also seen on the previous upper GI compatible with site of perforation and overlying Graham patch. 5. Small right pleural effusion with heterogeneous enhancement of collapsed lung raising the question of aspiration or developing pneumonia. 6. Appendix shown to be enhancing and with signs of presumed inflammation on the prior study. Appendix obscured on today's exam by surrounding inflammation.  Attention on follow-up. 7. Signs of atelectasis in the left chest. 8. 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 9. Prostatomegaly.  He is afebrile, WBC 14.3, hemoglobin 11.4, creatinine 0.72, potassium 3.1, PT 17.7, INR 1.5, COVID-19 negative.  Request now received from surgery for image guided aspiration/drainage of abdominal/pelvic fluid collections.  Past Medical History:  Diagnosis Date  . Acute renal insufficiency 09/05/2019  . Perforated gastric ulcer s/p omental [name] patch 09/05/2019 09/05/2019    Past Surgical History:  Procedure Laterality Date  . BOWEL RESECTION N/A 09/05/2019   Procedure: SMALL BOWEL RESECTION;  Surgeon: Clovis Riley, MD;  Location: [facility] ORS;  Service: General;  Laterality: N/A;  . LAPAROTOMY N/A 09/05/2019   Procedure: EXPLORATORY LAPAROTOMY WITH [facility] AND GASTRIC BIOPSY;  Surgeon: Clovis Riley, MD;  Location: [facility] ORS;  Service: General;  Laterality: N/A;    Allergies: Patient has no known allergies.  Medications: Prior to Admission medications   Medication Sig Start Date End Date Taking? Authorizing Provider  dimenhyDRINATE (DRAMAMINE) 50 MG tablet Take 50 mg by mouth every 8 (eight) hours as needed.   Yes [provider]     History reviewed. No pertinent family history.  Social History   Socioeconomic History  . Marital status: Single    Spouse name: Not on file  . Number of children: Not on file  . Years of education: Not on file  . Highest education level: Not on file  Occupational History  . Not on file  Tobacco Use  . Smoking status: Current Every Day Smoker    Packs/day: 0.50    Types: Cigarettes  . Smokeless tobacco: Never Used  Substance and Sexual Activity  . Alcohol use: Not on file  . Drug use: Not on file  . Sexual activity: Not on file  Other  Topics Concern  . Not on file  Social History Narrative   Just moved to [facility] from [place] 1 week ago.  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 Difficulty of Paying Living Expenses: Not on file  Food Insecurity:   . Worried About Charity fundraiser in the Last Year: Not on file  . Ran Out of Food in the Last Year: Not on file  Transportation Needs:   . Lack of Transportation (Medical): Not on file  . Lack of Transportation (Non-Medical): Not on file  Physical Activity:   . Days of Exercise per Week: Not on file  . Minutes of Exercise per Session: Not on file  Stress:   . Feeling of Stress : Not on file  Social Connections:   . Frequency of Communication with Friends and Family: Not on file  . Frequency of Social Gatherings with Friends and Family: Not on file  . Attends Religious Services: Not on file  . Active Member of Clubs or Organizations: Not on file  . Attends Archivist Meetings: Not on file  . Marital Status: Not on file      Review of Systems  See above; denies fever, chest pain, cough, back pain, vomiting or bleeding.  Vital Signs: BP 138/75 (BP Location: Left Arm)   Pulse 94   Temp 98.4 F (36.9 C) (Oral)   Resp 20   Ht 5\' 11"  (1.803 m)   Wt 175 lb (79.4 kg)   SpO2 95%   BMI 24.41 kg/m   Physical Exam awake, alert.  Chest with slightly diminished breath sounds rt base, left clear.  Heart with regular rate and rhythm.  Abdomen soft, positive bowel sounds, midline open wound overlying gauze dressing.  Some mild generalized tenderness to palpation.  No lower extremity edema.  Imaging: CT ABDOMEN PELVIS W CONTRAST  Result Date: 09/13/2019 CLINICAL DATA:  Post Graham patch, suspected abdominal abscess. EXAM: CT ABDOMEN AND PELVIS WITH CONTRAST TECHNIQUE: Multidetector CT imaging of the abdomen and pelvis was performed using the standard protocol following bolus administration of intravenous contrast. CONTRAST:  136mL OMNIPAQUE IOHEXOL 300 MG/ML  SOLN COMPARISON:  09/05/2019 FINDINGS: Lower chest: Small right-sided  pleural effusion is with basilar atelectasis. Volume loss on the left. Mildly heterogeneous enhancement of volume loss in the right chest. Hepatobiliary: Small area of low attenuation in the central liver left hepatic lobe likely small cysts. Lenticular fluid collection mixed with gas in the high right pericolic gutter tracking along the liver margin and above the right hepatic margin with a small locule of gas seen anterior to the liver likely within the superior margin of this collection in the subdiaphragmatic portion collection measures 6.5 x 3.0 x 5.3 cm. Portal vein is patent into the liver. Pancreas: Pancreas is normal, no sign of suspicious lesion. There is some surrounding stranding, presumably related to recent postoperative change and perforated ulcer. Spleen: Spleen is normal size without focal lesion. Adrenals/Urinary Tract: Adrenal glands with right adrenal thickening up to a cm of the body of the adrenal. Mild nodularity of the left adrenal. Signs of symmetric renal enhancement. No signs of hydronephrosis. Stomach/Bowel: Marked gastric edema. Irregularity at the site of previous perforation. Abundant fat in the lesser sac at the site of omental patch. Small, focal outpouching along the gastric antrum measures approximately 0.9 x 0.8 cm and is surrounded by fat with some edema  in the fat. No focal fluid or gas tracking beyond this location. No suggestion of extraluminal contrast following oral contrast administration. No signs of small bowel obstruction but with mild to moderate distension of small bowel loops Stranding about bowel loops in the pelvis is, associated with a pelvic collection showing peripheral enhancement. This collection measures 6.1 x 4.9 x 5.2 cm. Colon is predominantly contrast filled with areas of alternating under distension and dilation. There is irregularity of the cecum, cecum is better distended on today's study than it was on the initial scan of September 05, 2019. Thickening  of the cecum below the ileocecal valve, wall thickness suggested up to 1.7 cm. Significance uncertain on today's study given surrounding inflammation though this appears semi annular. Vascular/Lymphatic: Patent abdominal vasculature. No sign of adenopathy in the upper abdomen or retroperitoneum. No signs of pelvic lymphadenopathy. Reproductive: Prostatomegaly with heterogeneity, nonspecific finding on CT. Prostate fills the lower pelvis measuring 5.5 by 5.9 cm. Other: Midline postoperative changes with dressing over the anterior abdominal wall, healing via secondary intention. Small locule of gas deep to the midline incision only 4 mm without fluid along the midline incision or significant free intraperitoneal air. Gas seen on outside the bowel appears to be contained within abdominal collections which are described above. 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 No acute bone finding or destructive bone process. IMPRESSION: 1. Peripherally enhancing fluid collections in the pelvis and inferior to liver tracking along the right hemi liver, suspicious for infected collection/abscess following previous gastric perforation. 2. Thickening of the cecum below the ileocecal valve, wall thickness suggested up to 1.7 cm. Significance uncertain on today's study given surrounding inflammation. This is however suspicious for colonic neoplasm, suggest follow-up colonoscopy or correlation with previous colonoscopy results if recent and available. 3. Tiny locule of gas along the anterior abdomen without significant free air. 4. Signs of previous Graham patch with irregularity along the lesser curvature of the stomach., small focal outpouching also seen on the previous upper GI compatible with site of perforation and overlying Graham patch. 5. Small right pleural effusion with heterogeneous enhancement of collapsed lung raising the question of aspiration or  developing pneumonia. 6. Appendix shown to be enhancing and with signs of presumed inflammation on the prior study. Appendix obscured on today's exam by surrounding inflammation. Attention on follow-up. 7. Signs of atelectasis in the left chest. 8. 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 9. Prostatomegaly. 10. These results were called by telephone at the time of interpretation on 09/13/2019 at 12:46 pm to provider [facility] , who verbally acknowledged these results. Electronically Signed   By: Zetta Bills M.D.   On: 09/13/2019 12:50   CT Abdomen Pelvis W Contrast  Result Date: 09/05/2019 CLINICAL DATA:  Onset abdominal pain yesterday afternoon after eating. EXAM: CT ABDOMEN AND PELVIS WITH CONTRAST TECHNIQUE: Multidetector CT imaging of the abdomen and pelvis was performed using the standard protocol following bolus administration of intravenous contrast. CONTRAST:  135mL OMNIPAQUE IOHEXOL 300 MG/ML  SOLN COMPARISON:  None. FINDINGS: Lower chest: Small right and trace left pleural effusions. Basilar atelectasis on the right. Hepatobiliary: No focal liver abnormality is seen. No gallstones, gallbladder wall thickening, or biliary dilatation. Pancreas: Unremarkable. No pancreatic ductal dilatation or surrounding inflammatory changes. Spleen: Normal in size without focal abnormality. Adrenals/Urinary Tract: Adrenal glands are unremarkable. Kidneys are normal, without renal calculi, focal lesion, or hydronephrosis. Bladder is unremarkable. Stomach/Bowel: There is a large volume  of free intraperitoneal air and moderate free fluid. The walls of the duodenum are thickened and hyperenhancing and a focal perforation in the medial wall of the duodenum is best seen on image 16 of series 7. Abnormal enhancement and wall thickening in multiple loops of small bowel in the left abdomen is likely reactive from perforation and free fluid. The colon and appendix are unremarkable. There is no  pneumatosis, portal venous gas or free intraperitoneal air. No evidence of bowel obstruction. Vascular/Lymphatic: No significant vascular findings are present. No enlarged abdominal or pelvic lymph nodes. Reproductive: Marked prostatomegaly. Other: None. Musculoskeletal: No acute or focal abnormality. IMPRESSION: Findings most consistent with perforation of the duodenum with an associated free intraperitoneal air and fluid. Small right and trace left pleural effusions. Marked prostatomegaly. Critical Value/emergent results were called by telephone at the time of interpretation on 09/05/2019 at 10:40 am to providerMICHAEL BUTLER , who verbally acknowledged these results. Electronically Signed   By: Inge Rise M.D.   On: 09/05/2019 10:45   DG Chest Port 1 View  Result Date: 09/05/2019 CLINICAL DATA:  Abdominal pain, nausea EXAM: PORTABLE CHEST 1 VIEW COMPARISON:  None. FINDINGS: The heart size and mediastinal contours are within normal limits. Streaky opacity within the right lung base. Left lung appears clear. No pleural effusion or pneumothorax. No acute osseous findings. IMPRESSION: Streaky opacity within the right lung base which may reflect atelectasis or pneumonia. Electronically Signed   By: Davina Poke D.O.   On: 09/05/2019 08:57   DG Abd Portable 1V  Result Date: 09/11/2019 CLINICAL DATA:  Follow-up ileus.  Generalized abdominal pain today. EXAM: PORTABLE ABDOMEN - 1 VIEW COMPARISON:  CT, 09/05/2019. FINDINGS: Residual contrast from an upper GI study lies within a nondilated colon. There are loops of prominent, but not overtly dilated, small bowel that are partly air-filled. No convincing bowel obstruction. IMPRESSION: 1. Prominent partly air-filled small bowel without over dilation to suggest obstruction. Contrast from an upper GI study dated 09/09/2019 is now visualized within the colon and rectum. Electronically Signed   By: Lajean Manes M.D.   On: 09/11/2019 11:23   DG UGI W SINGLE CM  (SOL OR THIN BA)  Result Date: 09/09/2019 CLINICAL DATA:  Post Graham patch repair EXAM: WATER SOLUBLE UPPER GI SERIES TECHNIQUE: Single-column upper GI series was performed using water soluble contrast. CONTRAST:  Water-soluble contrast COMPARISON:  CT abdomen and pelvis of 09/05/2019 FLUOROSCOPY TIME:  Fluoroscopy Time:  4 minutes 54 seconds Radiation Exposure Index (if provided by the fluoroscopic device): 48.7 mGy Number of Acquired Spot Images: 7 FINDINGS: Water-soluble contrast was administered. The patient was not able to swallow large volumes and there was stasis in the esophagus. Attempts were made to is administered through the NG tube which passed directly in the small bowel. There is a subtle irregular outpouching along the distal portion of the stomach but there was no sign of extension beyond this area which is presumably the site of the [facility] patch repair best seen on oblique views. No sign of contrast passing into the drain during the examination. Multiple distended loops of bowel are noted. IMPRESSION: 1. Post Graham patch repair of perforated ulcer with some irregularity but no extension beyond this area to suggest leak. This is likely the site of repair. Examination limited due to difficulty in patient positioning and the small volume that the patient was able to ingest for the examination. Electronically Signed   By: Zetta Bills M.D.   On: 09/09/2019 11:19  Labs:  CBC: Recent Labs    09/06/19 0002 09/07/19 0431 09/12/19 0428 09/13/19 0413  WBC 9.8 14.6* 13.6* 14.3*  HGB 14.5 13.5 12.9* 11.4*  HCT 46.9 41.7 40.0 35.7*  PLT 190 182 180 154    COAGS: Recent Labs    09/05/19 1058 09/05/19 1531 09/06/19 0002 09/07/19 0431  INR 4.8* 1.6* 1.7* 1.5*  APTT 78*  --   --   --     BMP: Recent Labs    09/06/19 0002 09/07/19 0431 09/12/19 0428 09/13/19 0413  NA 139 134* 135 136  K 4.8 3.9 3.7 3.1*  CL 106 102 97* 101  CO2 24 24 31 28   GLUCOSE 133* 114* 139* 121*   BUN 14 10 5* <5*  CALCIUM 7.6* 7.7* 7.9* 7.6*  CREATININE 1.08 0.91 0.93 0.72  GFRNONAA >60 >60 >60 >60  GFRAA >60 >60 >60 >60    LIVER FUNCTION TESTS: Recent Labs    09/05/19 0828 09/06/19 0002  BILITOT 1.1 0.9  AST 29 22  ALT 17 16  ALKPHOS 85 48  PROT 7.4 5.1*  ALBUMIN 3.8 2.7*    TUMOR MARKERS: No results for input(s): AFPTM, CEA, CA199, CHROMGRNA in the last 8760 hours.  Assessment and Plan: 79 y.o. male who was admitted to [facility] on 1/21 with abdominal pain and nausea as well as associated dyspnea.  Subsequent CT scan of abdomen pelvis revealed perforation of the duodenum with associated free intraperitoneal air and fluid.  He underwent exploratory laparotomy, gastric biopsy, omental pedicle flap/Graham patch repair of perforated pyloric ulcer on 09/05/2019.  Follow-up CT abdomen pelvis on 1/29 revealed: 1. Peripherally enhancing fluid collections in the pelvis and inferior to liver tracking along the right hemi liver, suspicious for infected collection/abscess following previous gastric perforation. 2. Thickening of the cecum below the ileocecal valve, wall thickness suggested up to 1.7 cm. Significance uncertain on today's study given surrounding inflammation. This is however suspicious for colonic neoplasm, suggest follow-up colonoscopy or correlation with previous colonoscopy results if recent and available. 3. Tiny locule of gas along the anterior abdomen without significant free air. 4. Signs of previous Graham patch with irregularity along the lesser curvature of the stomach., small focal outpouching also seen on the previous upper GI compatible with site of perforation and overlying Graham patch. 5. Small right pleural effusion with heterogeneous enhancement of collapsed lung raising the question of aspiration or developing pneumonia. 6. Appendix shown to be enhancing and with signs of presumed inflammation on the prior study. Appendix obscured  on today's exam by surrounding inflammation. Attention on follow-up. 7. Signs of atelectasis in the left chest. 8. 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 9. Prostatomegaly.  He is afebrile, WBC 14.3, hemoglobin 11.4, creatinine 0.72, potassium 3.1, PT 17.7, INR 1.5, COVID-19 negative.  Request now received from surgery for image guided aspiration/drainage of abdominal/pelvic fluid collections.  Imaging studies been reviewed by Dr. Annamaria Boots.Risks and benefits discussed with the patient including bleeding, infection, damage to adjacent structures, bowel perforation/fistula connection, and sepsis.  All of the patient's questions were answered, patient is agreeable to proceed. Consent signed and in chart.  Patient had Lovenox today.  Procedure will be scheduled for 1/30.   Thank you for this interesting consult.  I greatly enjoyed meeting [facility] and look forward to participating in their care.  A copy of this report was sent to the requesting provider on this date.  Electronically Signed: D. Rowe Robert, PA-C 09/13/2019, 3:08  PM   I spent a total of 30 minutes    in face to face in clinical consultation, greater than 50% of which was counseling/coordinating care for CT-guided aspiration/possible drainage of abdominal/pelvic fluid collections

## 2019-09-14 ENCOUNTER — Inpatient Hospital Stay (HOSPITAL_COMMUNITY): Payer: Medicare PPO

## 2019-09-14 LAB — BASIC METABOLIC PANEL
Anion gap: 10 (ref 5–15)
BUN: <5 mg/dL — ABNORMAL LOW (ref 8–23)
CO2: 28 mmol/L (ref 22–32)
Calcium: 7.9 mg/dL — ABNORMAL LOW (ref 8.9–10.3)
Chloride: 99 mmol/L (ref 98–111)
Creatinine, Ser: 0.87 mg/dL (ref 0.61–1.24)
GFR calc Af Amer: >60 mL/min (ref >60)
GFR calc non Af Amer: >60 mL/min (ref >60)
Glucose, Bld: 112 mg/dL — ABNORMAL HIGH (ref 70–99)
Potassium: 3.8 mmol/L (ref 3.5–5.1)
Sodium: 137 mmol/L (ref 135–145)

## 2019-09-14 LAB — CBC
HCT: 40.2 % (ref 39.0–52.0)
Hemoglobin: 12.4 g/dL — ABNORMAL LOW (ref 13.0–17.0)
MCH: 26.1 pg (ref 26.0–34.0)
MCHC: 30.8 g/dL (ref 30.0–36.0)
MCV: 84.5 fL (ref 80.0–100.0)
Platelets: 206 10*3/uL (ref 150–400)
RBC: 4.76 MIL/uL (ref 4.22–5.81)
RDW: 14.3 % (ref 11.5–15.5)
WBC: 14.5 10*3/uL — ABNORMAL HIGH (ref 4.0–10.5)
nRBC: 0 % (ref 0.0–0.2)

## 2019-09-14 LAB — PROTIME-INR
INR: 1.3 — ABNORMAL HIGH (ref 0.8–1.2)
Prothrombin Time: 16.2 seconds — ABNORMAL HIGH (ref 11.4–15.2)

## 2019-09-14 IMAGING — CT CT IMAGE GUIDED DRAINAGE BY PERCUTANEOUS CATHETER
1 of 2 series · 13 of 32 positions shown, 17 images · non-contrast
Comparison: CT abdomen and pelvis - [DATE]

INDICATION: History of gastric ulcer perforation, postoperative repair, now with
abdominal and pelvic fluid collections. Please perform CT-guided
percutaneous drainage catheter placement for infection source
control purposes.

EXAM:
CT IMAGE GUIDED DRAINAGE BY PERCUTANEOUS CATHETER x2

[Series 2: i-spiral 5.0 b31f · axial · 0.98mm/px · z∈[-250,-16]mm · 13 of 76 slices shown, 17 images]
[im 6/76  soft-tissue]
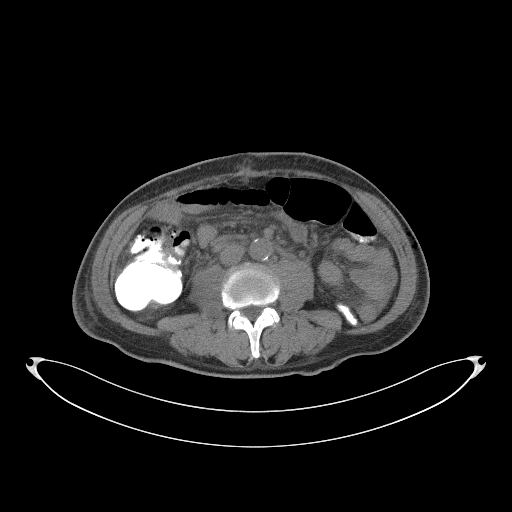
[im 6/76  bone]
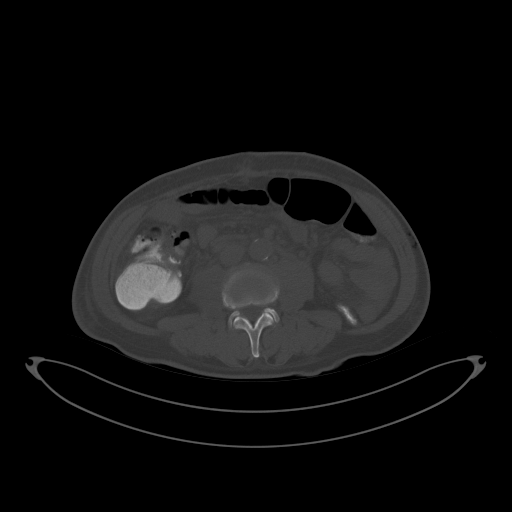
[im 11/76  soft-tissue]
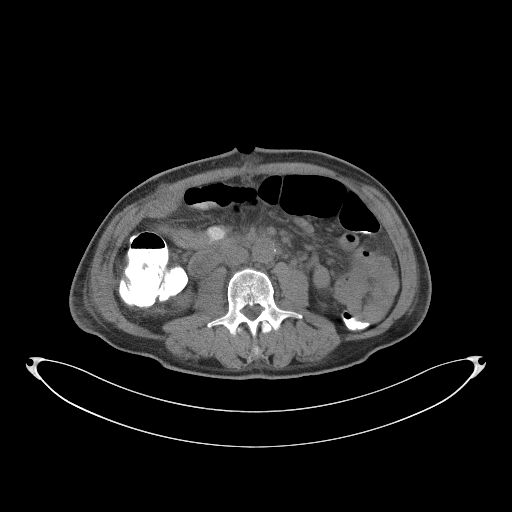
[im 19/76  soft-tissue]
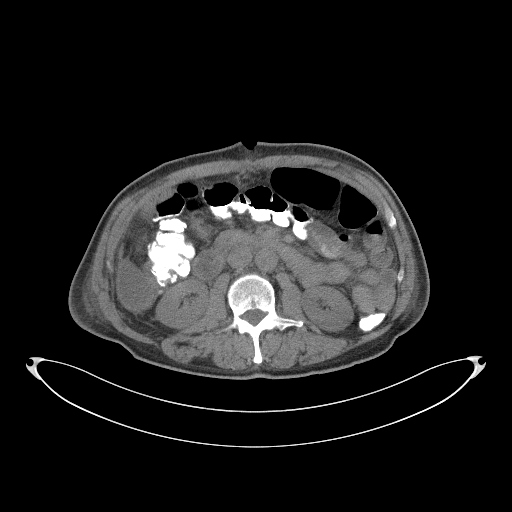
[im 25/76  soft-tissue]
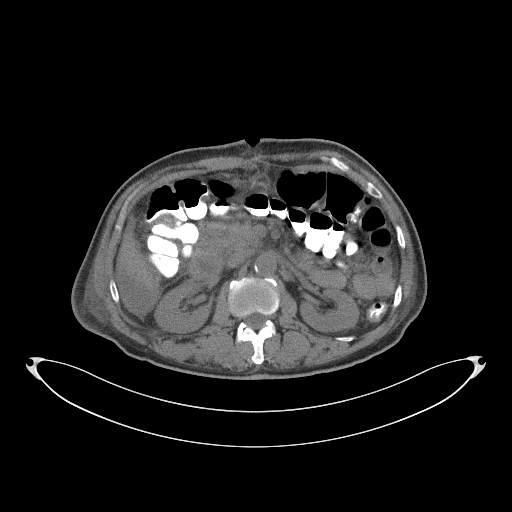
[im 33/76  soft-tissue]
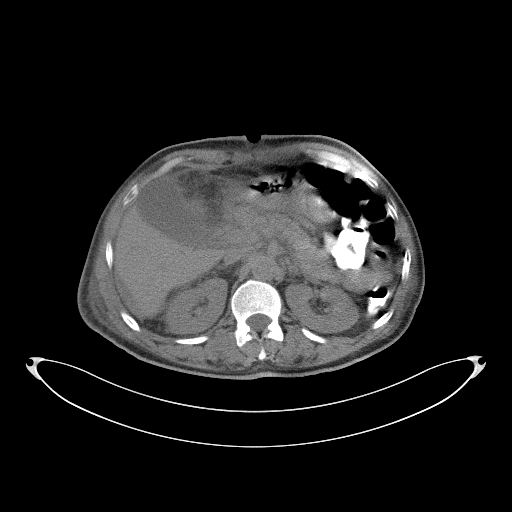
[im 38/76  soft-tissue]
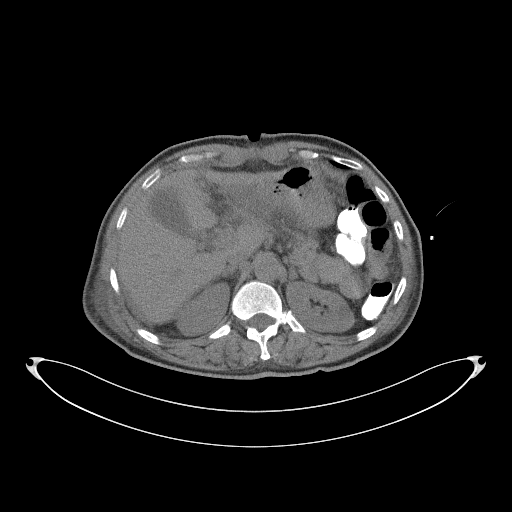
[im 43/76  soft-tissue]
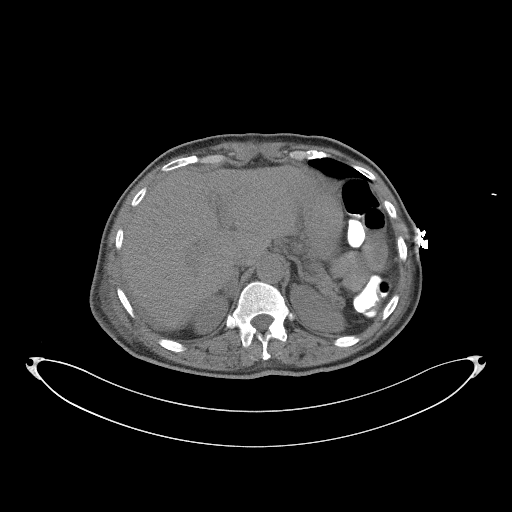
[im 51/76  soft-tissue]
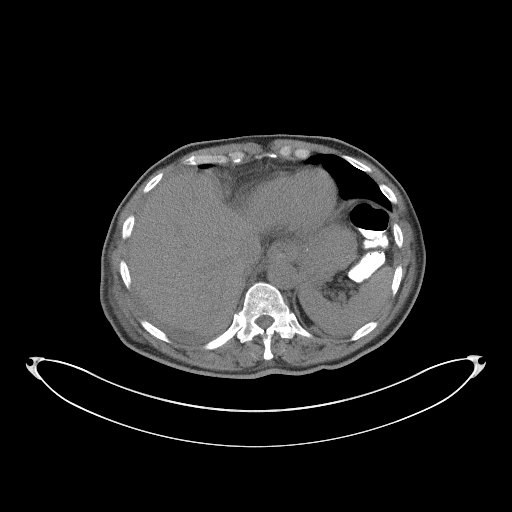
[im 57/76  soft-tissue]
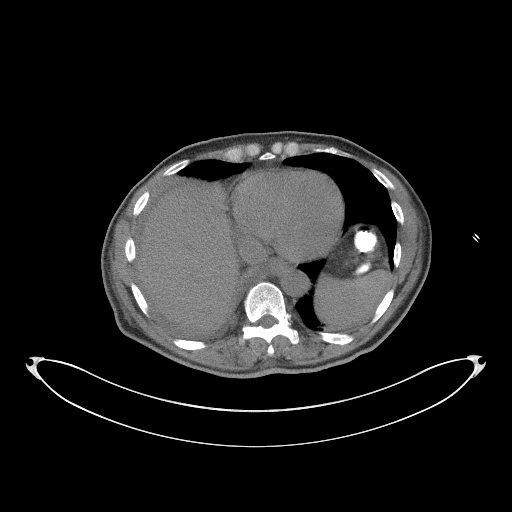
[im 57/76  bone]
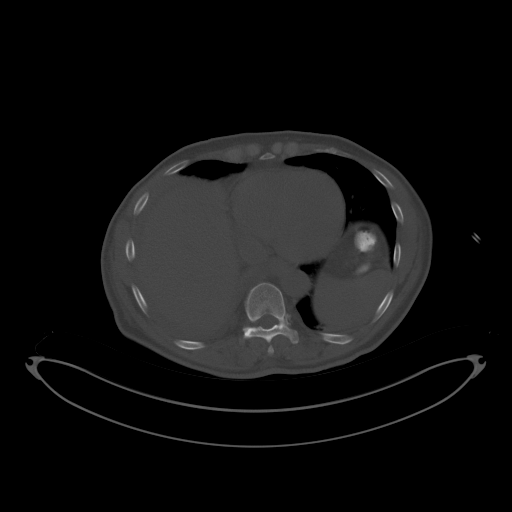
[im 65/76  soft-tissue]
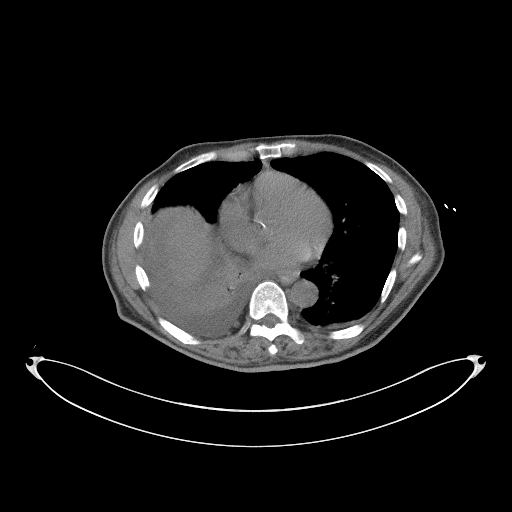
[im 65/76  lung]
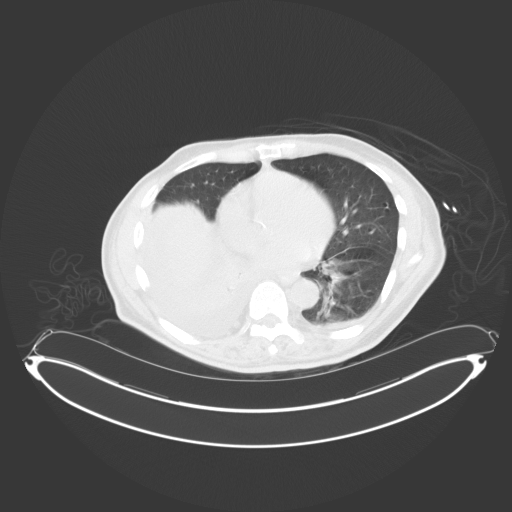
[im 67/76  lung]
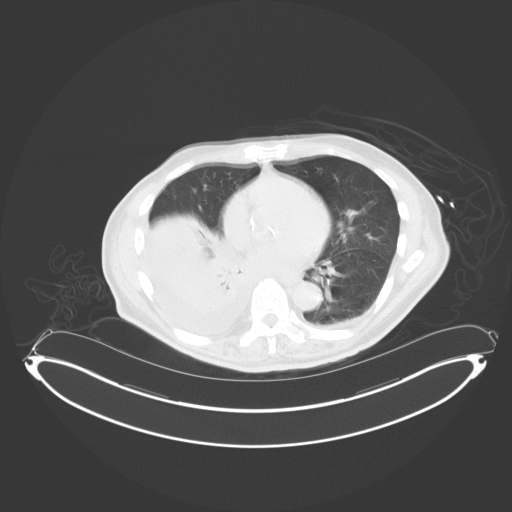
[im 70/76  soft-tissue]
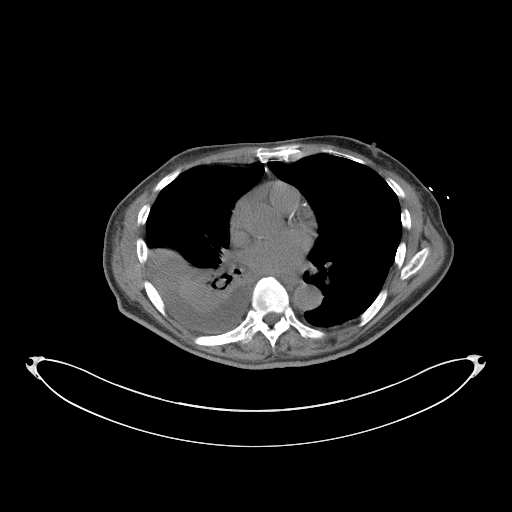
[im 70/76  lung]
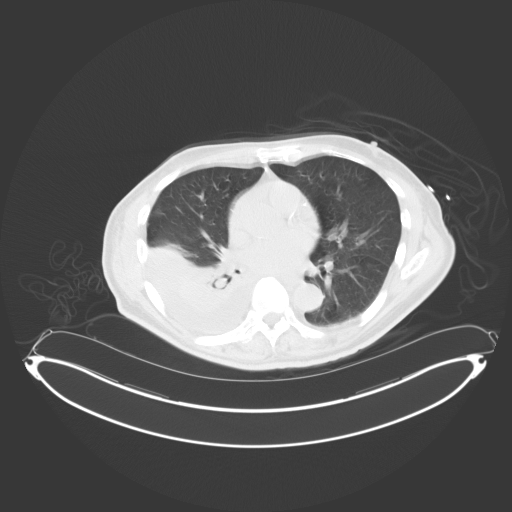
[im 73/76  lung]
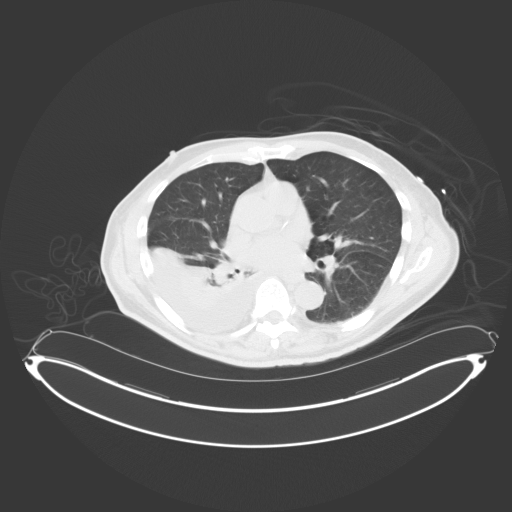

[13 of 32 positions shown; findings below may reference images not displayed]

MEDICATIONS:
The patient is currently admitted to the hospital and receiving
intravenous antibiotics. The antibiotics were administered within an
appropriate time frame prior to the initiation of the procedure.

ANESTHESIA/SEDATION:
Moderate (conscious) sedation was employed during this procedure. A
total of Versed 3 mg and Fentanyl 150 mcg was administered
intravenously.

Moderate Sedation Time: 20 minutes. The patient's level of
consciousness and vital signs were monitored continuously by
radiology nursing throughout the procedure under my direct
supervision.

CONTRAST:  None

COMPLICATIONS:
None immediate.

PROCEDURE:
Informed written consent was obtained from the patient after a
discussion of the risks, benefits and alternatives to treatment. The
patient was initially placed supine on the CT gantry and a pre
procedural CT was performed re-demonstrating the known abscess/fluid
collection within the right upper abdominal quadrant, perihepatic
space with dominant component measuring approximate 5.3 x 3.6 cm
(image 57, series 2). The procedure was planned. A timeout was
performed prior to the initiation of the procedure.

The skin overlying the right upper abdominal quadrant was prepped
and draped in the usual sterile fashion. The overlying soft tissues
were anesthetized with 1% lidocaine with epinephrine. Appropriate
trajectory was planned with the use of a 22 gauge spinal needle. An
18 gauge trocar needle was advanced into the abscess/fluid
collection and a short Amplatz super stiff wire was coiled within
the collection. Appropriate positioning was confirmed with a limited
CT scan. The tract was serially dilated allowing placement of a 10
French all-purpose drainage catheter. Appropriate positioning was
confirmed with a limited postprocedural CT scan.

10 cc of bloody, slightly complex fluid was aspirated. The tube was
connected to a JP bulb and sutured in place.

The patient was then positioned prone on the fluoroscopy table.
Preprocedural imaging demonstrates unchanged size and appearance of
the approximately 5.7 x 4.4 cm fluid collection within midline of
the pelvis (image 35, series 2). The procedure was planned.

The skin overlying the right buttocks was prepped and draped in
usual sterile fashion. The overlying soft tissues were anesthetized
with 1% lidocaine with epinephrine. Appropriate trajectory utilizing
a right trans gluteal approach was planned with a 22 gauge spinal
needle requiring a slight caudal to cranial trajectory. Next, an 18
gauge trocar needle was advanced into the abscess/fluid collection
and a short Amplatz wire was coiled within the collection.
Appropriate position was confirmed with a limited CT scan.

The track was serially dilated, ultimately allowing placement of a
10 French percutaneous drainage catheter. Appropriate position was
confirmed with limited postprocedural CT scan.

Approximately 25 cc of purulent appearing fluid was aspirated. The
tube was connected a JP bulb and sutured in place. A dressing was
applied.

Dressings were applied. The patient tolerated the above procedures
well without immediate postprocedural complication.
IMPRESSION: 1. Successful CT guided placement of a 10 French all purpose drain
catheter into the perihepatic space fluid collection with aspiration
of 10 mL of bloody, slightly complex fluid.
2. Successful CT-guided placement of a 10 French all-purpose
drainage catheter into pelvic fluid collection via right trans
gluteal approach with aspiration of 25 cc of purulent appearing
fluid.
3. Samples were sent separately from each collection to the
laboratory for analysis.

## 2019-09-14 IMAGING — CT CT IMAGE GUIDED DRAINAGE BY PERCUTANEOUS CATHETER
1 of 5 series · 10 of 32 positions shown, 16 images · non-contrast
Comparison: CT abdomen and pelvis - [DATE]

INDICATION: History of gastric ulcer perforation, postoperative repair, now with
abdominal and pelvic fluid collections. Please perform CT-guided
percutaneous drainage catheter placement for infection source
control purposes.

EXAM:
CT IMAGE GUIDED DRAINAGE BY PERCUTANEOUS CATHETER x2

[Series 2: i-spiral 5.0 b31f · axial · 0.98mm/px · z∈[-245,-52]mm · 10 of 69 slices shown, 16 images]
[im 7/69  soft-tissue]
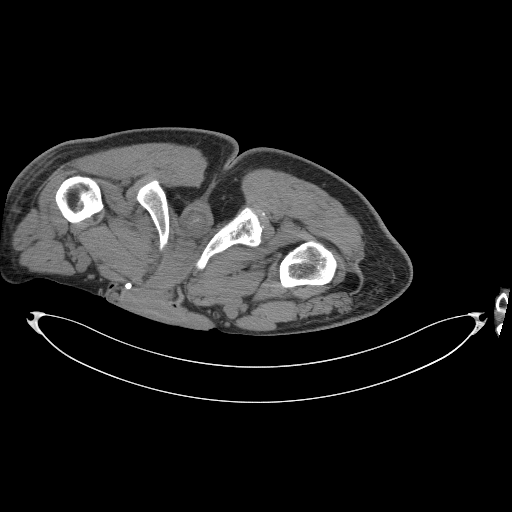
[im 7/69  bone]
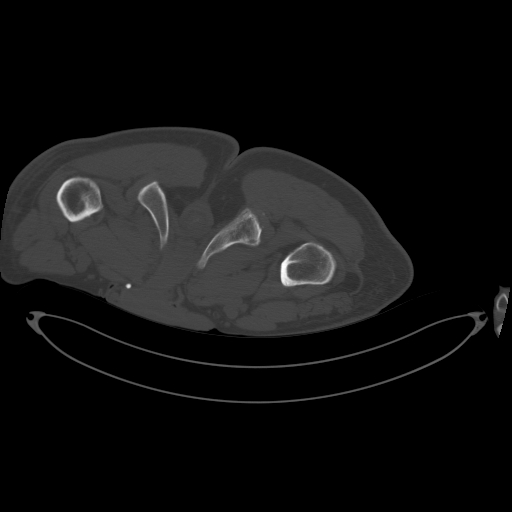
[im 13/69  soft-tissue]
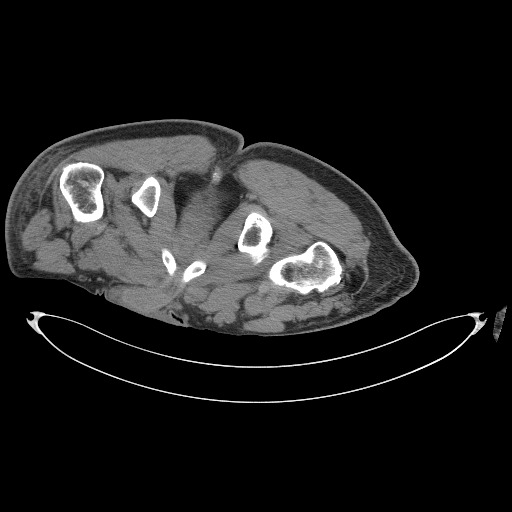
[im 19/69  soft-tissue]
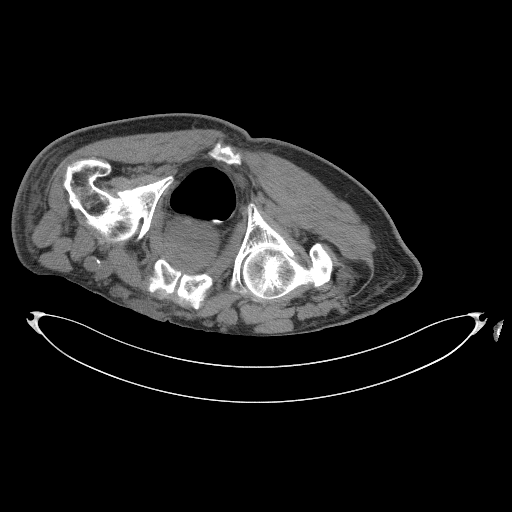
[im 25/69  soft-tissue]
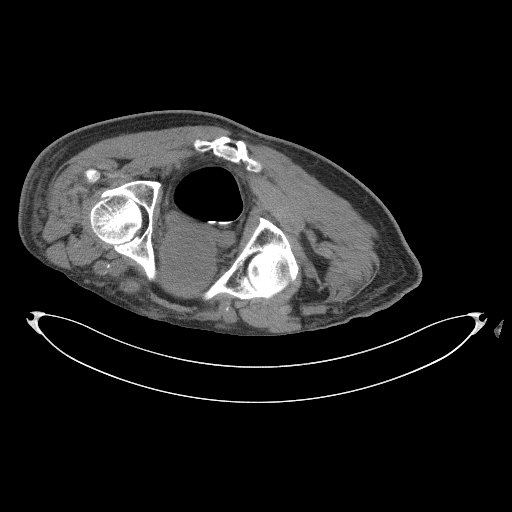
[im 31/69  soft-tissue]
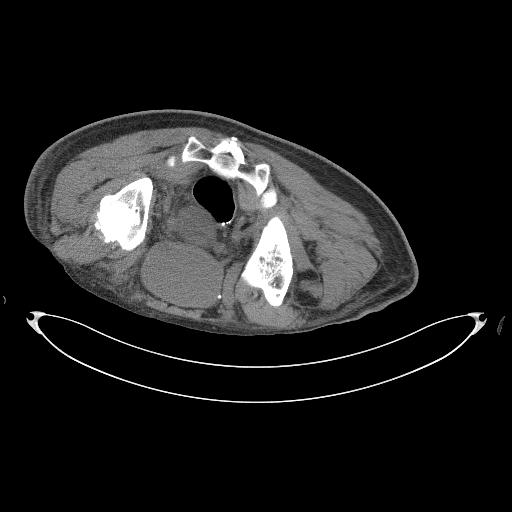
[im 38/69  soft-tissue]
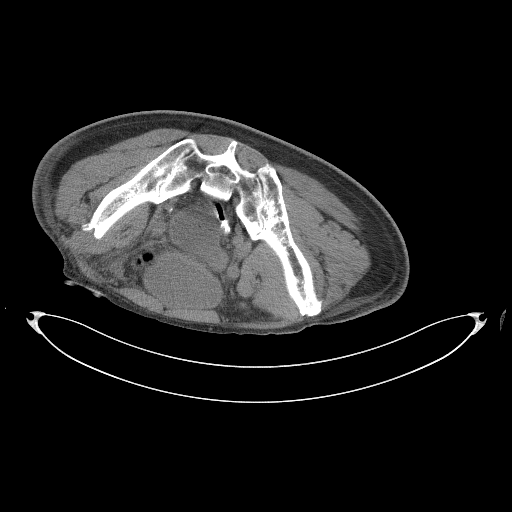
[im 44/69  soft-tissue]
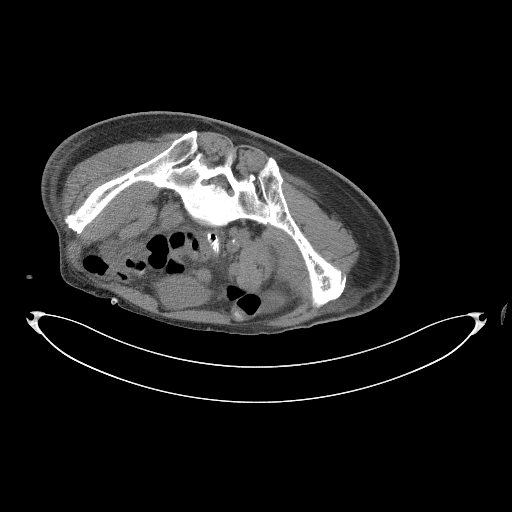
[im 44/69  lung]
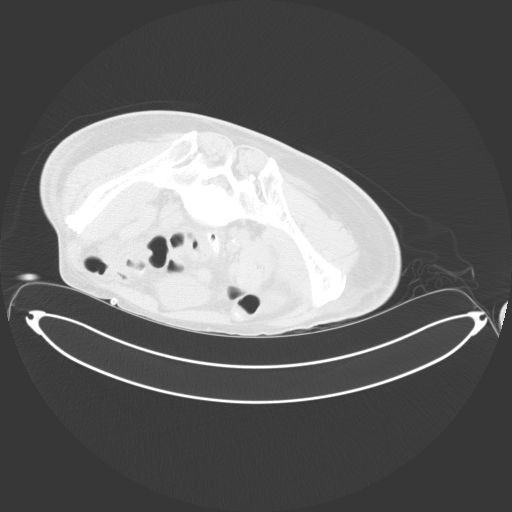
[im 50/69  soft-tissue]
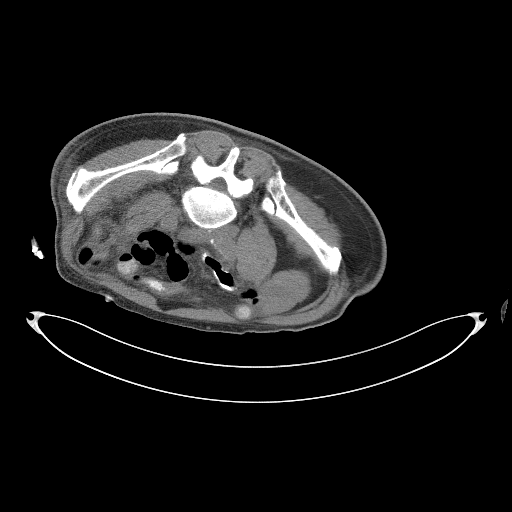
[im 50/69  lung]
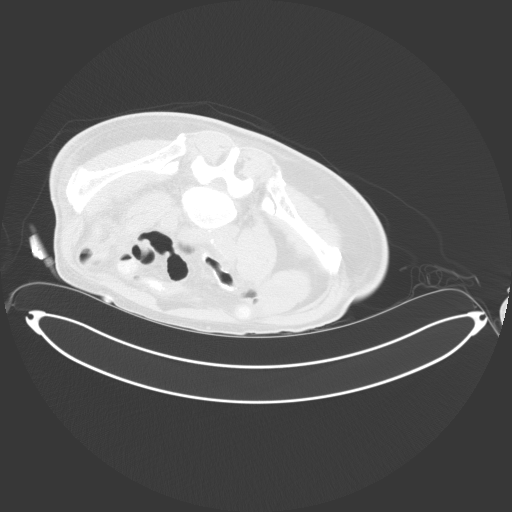
[im 56/69  soft-tissue]
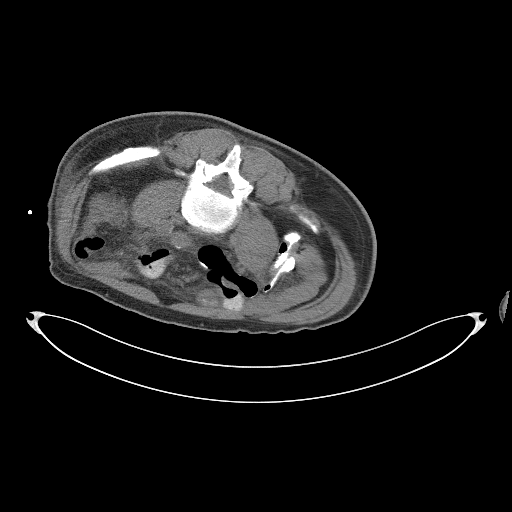
[im 56/69  lung]
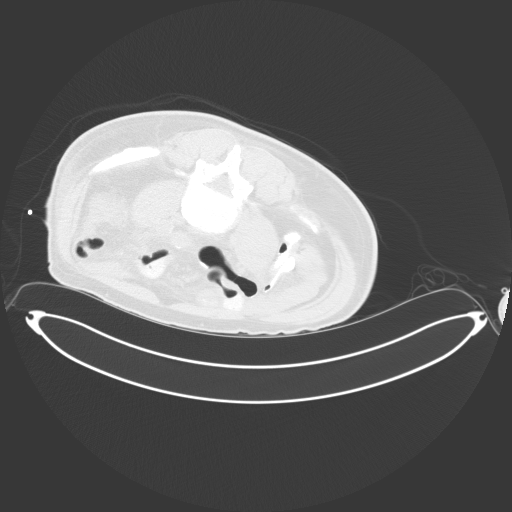
[im 56/69  bone]
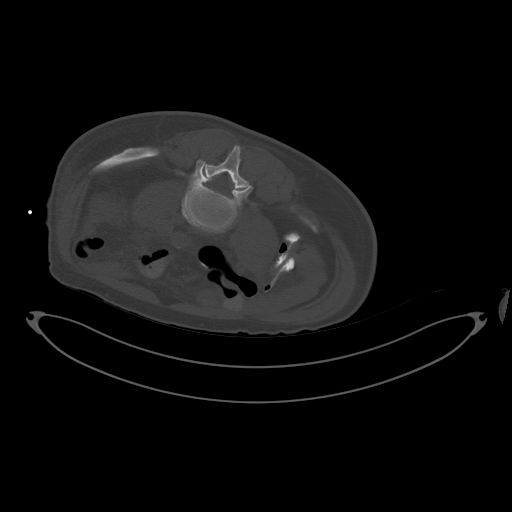
[im 62/69  soft-tissue]
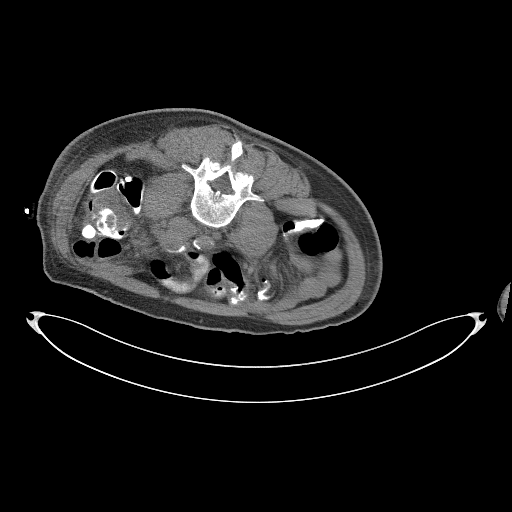
[im 62/69  lung]
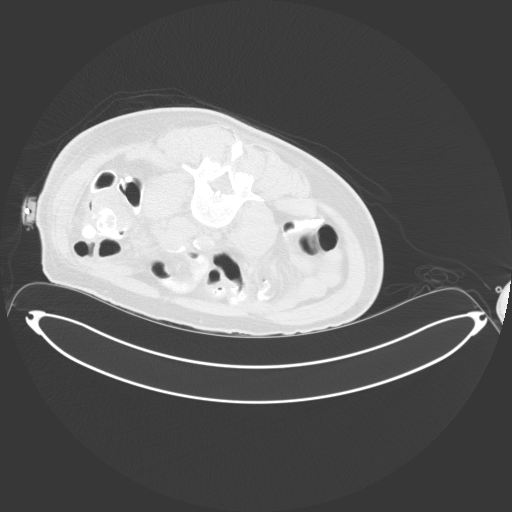

[10 of 32 positions shown; findings below may reference images not displayed]

MEDICATIONS:
The patient is currently admitted to the hospital and receiving
intravenous antibiotics. The antibiotics were administered within an
appropriate time frame prior to the initiation of the procedure.

ANESTHESIA/SEDATION:
Moderate (conscious) sedation was employed during this procedure. A
total of Versed 3 mg and Fentanyl 150 mcg was administered
intravenously.

Moderate Sedation Time: 20 minutes. The patient's level of
consciousness and vital signs were monitored continuously by
radiology nursing throughout the procedure under my direct
supervision.

CONTRAST:  None

COMPLICATIONS:
None immediate.

PROCEDURE:
Informed written consent was obtained from the patient after a
discussion of the risks, benefits and alternatives to treatment. The
patient was initially placed supine on the CT gantry and a pre
procedural CT was performed re-demonstrating the known abscess/fluid
collection within the right upper abdominal quadrant, perihepatic
space with dominant component measuring approximate 5.3 x 3.6 cm
(image 57, series 2). The procedure was planned. A timeout was
performed prior to the initiation of the procedure.

The skin overlying the right upper abdominal quadrant was prepped
and draped in the usual sterile fashion. The overlying soft tissues
were anesthetized with 1% lidocaine with epinephrine. Appropriate
trajectory was planned with the use of a 22 gauge spinal needle. An
18 gauge trocar needle was advanced into the abscess/fluid
collection and a short Amplatz super stiff wire was coiled within
the collection. Appropriate positioning was confirmed with a limited
CT scan. The tract was serially dilated allowing placement of a 10
French all-purpose drainage catheter. Appropriate positioning was
confirmed with a limited postprocedural CT scan.

10 cc of bloody, slightly complex fluid was aspirated. The tube was
connected to a JP bulb and sutured in place.

The patient was then positioned prone on the fluoroscopy table.
Preprocedural imaging demonstrates unchanged size and appearance of
the approximately 5.7 x 4.4 cm fluid collection within midline of
the pelvis (image 35, series 2). The procedure was planned.

The skin overlying the right buttocks was prepped and draped in
usual sterile fashion. The overlying soft tissues were anesthetized
with 1% lidocaine with epinephrine. Appropriate trajectory utilizing
a right trans gluteal approach was planned with a 22 gauge spinal
needle requiring a slight caudal to cranial trajectory. Next, an 18
gauge trocar needle was advanced into the abscess/fluid collection
and a short Amplatz wire was coiled within the collection.
Appropriate position was confirmed with a limited CT scan.

The track was serially dilated, ultimately allowing placement of a
10 French percutaneous drainage catheter. Appropriate position was
confirmed with limited postprocedural CT scan.

Approximately 25 cc of purulent appearing fluid was aspirated. The
tube was connected a JP bulb and sutured in place. A dressing was
applied.

Dressings were applied. The patient tolerated the above procedures
well without immediate postprocedural complication.
IMPRESSION: 1. Successful CT guided placement of a 10 French all purpose drain
catheter into the perihepatic space fluid collection with aspiration
of 10 mL of bloody, slightly complex fluid.
2. Successful CT-guided placement of a 10 French all-purpose
drainage catheter into pelvic fluid collection via right trans
gluteal approach with aspiration of 25 cc of purulent appearing
fluid.
3. Samples were sent separately from each collection to the
laboratory for analysis.

## 2019-09-14 MED ORDER — LIDOCAINE HCL (PF) 1 % IJ SOLN
INTRAMUSCULAR | Status: AC | PRN
  Administered 2019-09-14: 10 mL via INTRADERMAL

## 2019-09-14 MED ORDER — MIDAZOLAM HCL 2 MG/2ML IJ SOLN
INTRAMUSCULAR | Status: AC | PRN
  Administered 2019-09-14: 1 mg via INTRAVENOUS
  Administered 2019-09-14: 0.5 mg via INTRAVENOUS

## 2019-09-14 MED ORDER — ADULT MULTIVITAMIN W/MINERALS CH
1.0000 | ORAL_TABLET | Freq: Every day | ORAL | Status: DC
  Administered 2019-09-15 – 2019-09-20 (×6): 1 via ORAL
  Filled 2019-09-14 (×6): qty 1

## 2019-09-14 MED ORDER — FENTANYL CITRATE (PF) 100 MCG/2ML IJ SOLN
INTRAMUSCULAR | Status: AC | PRN
  Administered 2019-09-14 (×2): 25 ug via INTRAVENOUS

## 2019-09-14 MED ORDER — FENTANYL CITRATE (PF) 100 MCG/2ML IJ SOLN
INTRAMUSCULAR | Status: AC
  Filled 2019-09-14: qty 4

## 2019-09-14 MED ORDER — FENTANYL CITRATE (PF) 100 MCG/2ML IJ SOLN
INTRAMUSCULAR | Status: AC | PRN
  Administered 2019-09-14: 25 ug via INTRAVENOUS
  Administered 2019-09-14: 50 ug via INTRAVENOUS

## 2019-09-14 MED ORDER — MIDAZOLAM HCL 2 MG/2ML IJ SOLN
INTRAMUSCULAR | Status: AC | PRN
  Administered 2019-09-14 (×2): 0.5 mg via INTRAVENOUS

## 2019-09-14 MED ORDER — MIDAZOLAM HCL 2 MG/2ML IJ SOLN
INTRAMUSCULAR | Status: AC
  Filled 2019-09-14: qty 4

## 2019-09-14 MED ORDER — ENSURE ENLIVE PO LIQD
237.0000 mL | Freq: Two times a day (BID) | ORAL | Status: DC
  Administered 2019-09-14 – 2019-09-15 (×4): 237 mL via ORAL

## 2019-09-14 MED ORDER — SODIUM CHLORIDE 0.9% FLUSH
5.0000 mL | Freq: Three times a day (TID) | INTRAVENOUS | Status: DC
  Administered 2019-09-14 – 2019-09-20 (×12): 5 mL

## 2019-09-14 NOTE — Progress Notes (Signed)
Progress Note: General Surgery Service   Chief Complaint/Subjective: Has urinary urgency, no complaints, down getting IR drain  Objective: Vital signs in last 24 hours: Temp:  [98.1 F (36.7 C)-99.5 F (37.5 C)] 99.3 F (37.4 C) (01/30 0518) Pulse Rate:  [84-98] 98 (01/30 0920) Resp:  [15-22] 17 (01/30 0920) BP: (135-167)/(72-93) 148/87 (01/30 0920) SpO2:  [92 %-100 %] 100 % (01/30 0920) Last BM Date: 09/13/19  Intake/Output from previous day: 01/29 0701 - 01/30 0700 In: 1763 [P.O.:600; I.V.:1065.2; IV Piggyback:97.8] Out: 1870 [Urine:1870] Intake/Output this shift: Total I/O In: 50 [IV Piggyback:50] Out: 175 [Urine:175]  Gen: NAD  Resp: nonlabored  Card: RRR  Abd: unable to examine in current position  Lab Results: CBC  Recent Labs    09/13/19 0413 09/14/19 0339  WBC 14.3* 14.5*  HGB 11.4* 12.4*  HCT 35.7* 40.2  PLT 154 206   BMET Recent Labs    09/13/19 0413 09/14/19 0339  NA 136 137  K 3.1* 3.8  CL 101 99  CO2 28 28  GLUCOSE 121* 112*  BUN <5* <5*  CREATININE 0.72 0.87  CALCIUM 7.6* 7.9*   PT/INR Recent Labs    09/14/19 0339  LABPROT 16.2*  INR 1.3*   ABG No results for input(s): PHART, HCO3 in the last 72 hours.  Invalid input(s): PCO2, PO2  Anti-infectives: Anti-infectives (From admission, onward)   Start     Dose/Rate Route Frequency Ordered Stop   09/13/19 1400  metroNIDAZOLE (FLAGYL) tablet 500 mg     500 mg Oral Every 8 hours 09/13/19 1041     09/13/19 1400  piperacillin-tazobactam (ZOSYN) IVPB 3.375 g     3.375 g 12.5 mL/hr over 240 Minutes Intravenous Every 8 hours 09/13/19 1322     09/13/19 1045  tetracycline (SUMYCIN) capsule 500 mg     500 mg Oral 4 times daily 09/13/19 1041     09/06/19 1600  anidulafungin (ERAXIS) 100 mg in sodium chloride 0.9 % 100 mL IVPB  Status:  Discontinued    Note to Pharmacy: Please change dosing to 100 mg after first day   100 mg 78 mL/hr over 100 Minutes Intravenous Every 24 hours 09/05/19  1148 09/05/19 1456   09/05/19 2000  piperacillin-tazobactam (ZOSYN) IVPB 3.375 g  Status:  Discontinued     3.375 g 12.5 mL/hr over 240 Minutes Intravenous Every 8 hours 09/05/19 1456 09/12/19 0926   09/05/19 1700  piperacillin-tazobactam (ZOSYN) IVPB 3.375 g  Status:  Discontinued     3.375 g 12.5 mL/hr over 240 Minutes Intravenous Every 8 hours 09/05/19 1148 09/05/19 1456   09/05/19 1300  anidulafungin (ERAXIS) 200 mg in sodium chloride 0.9 % 200 mL IVPB  Status:  Discontinued     200 mg 78 mL/hr over 200 Minutes Intravenous  Once 09/05/19 1215 09/05/19 1456   09/05/19 1045  piperacillin-tazobactam (ZOSYN) IVPB 3.375 g     3.375 g 100 mL/hr over 30 Minutes Intravenous  Once 09/05/19 1042 09/05/19 1124      Medications: Scheduled Meds: . amLODipine  5 mg Oral Daily  . bismuth subsalicylate  30 mL Oral TID AC & HS  . Chlorhexidine Gluconate Cloth  6 each Topical Daily  . [START ON 09/15/2019] enoxaparin (LOVENOX) injection  40 mg Subcutaneous Q24H  . folic acid  1 mg Oral Daily  . lip balm  1 application Topical BID  . mouth rinse  15 mL Mouth Rinse BID  . metroNIDAZOLE  500 mg Oral Q8H  . multivitamin  with minerals  1 tablet Oral Daily  . pantoprazole  40 mg Oral BID  . polyethylene glycol  17 g Oral BID  . simethicone  80 mg Oral QID  . tetracycline  500 mg Oral QID  . thiamine  100 mg Oral Daily   Or  . thiamine  100 mg Intravenous Daily   Continuous Infusions: . sodium chloride Stopped (09/06/19 0557)  . chlorproMAZINE (THORAZINE) IV 12.5 mg (09/13/19 1817)  . dextrose 5 % and 0.45% NaCl 50 mL/hr at 09/14/19 0610  . methocarbamol (ROBAXIN) IV    . piperacillin-tazobactam (ZOSYN)  IV 3.375 g (09/14/19 0742)   PRN Meds:.sodium chloride, bisacodyl, chlorproMAZINE (THORAZINE) IV, diphenhydrAMINE, fentaNYL, HYDROmorphone (DILAUDID) injection, magic mouthwash, methocarbamol (ROBAXIN) IV, metoprolol tartrate, midazolam, ondansetron **OR** ondansetron (ZOFRAN) IV, oxyCODONE,  prochlorperazine  Assessment/Plan: s/p Procedure(s): EXPLORATORY LAPAROTOMY WITH GRAHAM PATCH AND GASTRIC BIOPSY SMALL BOWEL RESECTION 09/05/2019 -IR drains placed today -will give liquids today -continue IV abx -continue quad H. Pylori treatment    LOS: 9 days   Mickeal Skinner, MD [facility]

## 2019-09-14 NOTE — Sedation Documentation (Signed)
Pt repositioned for second drain placement

## 2019-09-14 NOTE — Sedation Documentation (Signed)
Vital signs stable. 

## 2019-09-14 NOTE — Progress Notes (Signed)
PT Cancellation Note  Patient Details Name: Albert Love MRN: [id number] DOB: 07-31-1941   Cancelled Treatment:    Reason Eval/Treat Not Completed: Fatigue/lethargy limiting ability to participate(pt stated he just walked in the hallway with nursing a few minutes ago and is now wanting to rest. Will follow.)   Philomena Doheny PT 09/14/2019  Acute Rehabilitation Services Pager [phone] Office [phone]

## 2019-09-14 NOTE — Progress Notes (Signed)
MEDICATION-RELATED CONSULT NOTE   IR Procedure Consult - Anticoagulant/Antiplatelet PTA/Inpatient Med List Review by Pharmacist    Procedure: CT-guided abdominal abscess drain placement x 2    Completed: 1/30 @ [id number]  Post-Procedural bleeding risk per IR MD assessment: STANDARD  Antithrombotic medications on inpatient or PTA profile prior to procedure:  Lovenox 40 mg daily    Recommended restart time per IR Post-Procedure Guidelines:  POD1 AM   Other considerations:  Lovenox 40 mg SQ daily already ordered to start 1/31 AM   Plan:     Continue Lovenox ppx as ordered to start tomorrow  Reuel Boom, PharmD, BCPS [phone] 09/14/2019, 10:25 AM

## 2019-09-14 NOTE — Procedures (Signed)
Pre procedural Dx: Abdominal/pelvic abscess Post procedural Dx: Same  Technically successful CT guided placed of a 10 Fr drainage catheter placement into the perihepatic fluid collection yielding 10 cc of bloody, minimally complex fluid.   Technically successful CT guided placed of a 10 Fr drainage catheter placement into the pelvic abscess via right transgluteal approach yielding 25 cc of purulent appearing fluid.  A representative aspirated sample from each collection was sent separately to the laboratory for analysis.    EBL: Trace Complications: None immediate  Ronny Bacon, MD Pager #: [phone]

## 2019-09-15 NOTE — Progress Notes (Signed)
Patient lethargic this am. Will wake easily to verbal stimuli, oriented upon wakening. Refusing PO intake other than meds, refusing OOB. Reports feeling weak and just wants to rest. Will cont to monitor.

## 2019-09-15 NOTE — Progress Notes (Signed)
Progress Note: General Surgery Service   Chief Complaint/Subjective: Moderate pain from drains, no nausea  Objective: Vital signs in last 24 hours: Temp:  [98.3 F (36.8 C)-98.9 F (37.2 C)] 98.3 F (36.8 C) (01/31 0510) Pulse Rate:  [86-93] 87 (01/31 0510) Resp:  [16-21] 18 (01/31 0510) BP: (128-150)/(73-90) 150/88 (01/31 0510) SpO2:  [92 %-98 %] 95 % (01/31 0510) Last BM Date: 09/13/19  Intake/Output from previous day: 01/30 0701 - 01/31 0700 In: 601.3 [P.O.:240; I.V.:251.2; IV Piggyback:100] Out: 1006 [Urine:925; Drains:81] Intake/Output this shift: No intake/output data recorded.  Gen: NAD  Resp: nonlabored, +cough  Card: RR  Abd: soft, open wound healthy base, drains in place with minimal output  Lab Results: CBC  Recent Labs    09/13/19 0413 09/14/19 0339  WBC 14.3* 14.5*  HGB 11.4* 12.4*  HCT 35.7* 40.2  PLT 154 206   BMET Recent Labs    09/13/19 0413 09/14/19 0339  NA 136 137  K 3.1* 3.8  CL 101 99  CO2 28 28  GLUCOSE 121* 112*  BUN <5* <5*  CREATININE 0.72 0.87  CALCIUM 7.6* 7.9*   PT/INR Recent Labs    09/14/19 0339  LABPROT 16.2*  INR 1.3*   ABG No results for input(s): PHART, HCO3 in the last 72 hours.  Invalid input(s): PCO2, PO2  Anti-infectives: Anti-infectives (From admission, onward)   Start     Dose/Rate Route Frequency Ordered Stop   09/13/19 1400  metroNIDAZOLE (FLAGYL) tablet 500 mg     500 mg Oral Every 8 hours 09/13/19 1041     09/13/19 1400  piperacillin-tazobactam (ZOSYN) IVPB 3.375 g     3.375 g 12.5 mL/hr over 240 Minutes Intravenous Every 8 hours 09/13/19 1322     09/13/19 1045  tetracycline (SUMYCIN) capsule 500 mg     500 mg Oral 4 times daily 09/13/19 1041     09/06/19 1600  anidulafungin (ERAXIS) 100 mg in sodium chloride 0.9 % 100 mL IVPB  Status:  Discontinued    Note to Pharmacy: Please change dosing to 100 mg after first day   100 mg 78 mL/hr over 100 Minutes Intravenous Every 24 hours 09/05/19 1148  09/05/19 1456   09/05/19 2000  piperacillin-tazobactam (ZOSYN) IVPB 3.375 g  Status:  Discontinued     3.375 g 12.5 mL/hr over 240 Minutes Intravenous Every 8 hours 09/05/19 1456 09/12/19 0926   09/05/19 1700  piperacillin-tazobactam (ZOSYN) IVPB 3.375 g  Status:  Discontinued     3.375 g 12.5 mL/hr over 240 Minutes Intravenous Every 8 hours 09/05/19 1148 09/05/19 1456   09/05/19 1300  anidulafungin (ERAXIS) 200 mg in sodium chloride 0.9 % 200 mL IVPB  Status:  Discontinued     200 mg 78 mL/hr over 200 Minutes Intravenous  Once 09/05/19 1215 09/05/19 1456   09/05/19 1045  piperacillin-tazobactam (ZOSYN) IVPB 3.375 g     3.375 g 100 mL/hr over 30 Minutes Intravenous  Once 09/05/19 1042 09/05/19 1124      Medications: Scheduled Meds: . amLODipine  5 mg Oral Daily  . bismuth subsalicylate  30 mL Oral TID AC & HS  . enoxaparin (LOVENOX) injection  40 mg Subcutaneous Q24H  . feeding supplement (ENSURE ENLIVE)  237 mL Oral BID BM  . folic acid  1 mg Oral Daily  . lip balm  1 application Topical BID  . mouth rinse  15 mL Mouth Rinse BID  . metroNIDAZOLE  500 mg Oral Q8H  . multivitamin with minerals  1 tablet Oral Q lunch  . pantoprazole  40 mg Oral BID  . polyethylene glycol  17 g Oral BID  . simethicone  80 mg Oral QID  . sodium chloride flush  5 mL Intracatheter Q8H  . tetracycline  500 mg Oral QID  . thiamine  100 mg Oral Daily   Or  . thiamine  100 mg Intravenous Daily   Continuous Infusions: . sodium chloride Stopped (09/06/19 0557)  . chlorproMAZINE (THORAZINE) IV 12.5 mg (09/15/19 0719)  . dextrose 5 % and 0.45% NaCl 50 mL/hr at 09/14/19 1400  . methocarbamol (ROBAXIN) IV    . piperacillin-tazobactam (ZOSYN)  IV 3.375 g (09/15/19 0634)   PRN Meds:.sodium chloride, bisacodyl, chlorproMAZINE (THORAZINE) IV, diphenhydrAMINE, HYDROmorphone (DILAUDID) injection, magic mouthwash, methocarbamol (ROBAXIN) IV, metoprolol tartrate, ondansetron **OR** ondansetron (ZOFRAN) IV,  oxyCODONE, prochlorperazine  Assessment/Plan: s/p Procedure(s): EXPLORATORY LAPAROTOMY WITH GRAHAM PATCH AND GASTRIC BIOPSY SMALL BOWEL RESECTION 09/05/2019 -continue full liquids now -continue antibiotics -up to chair -pain control   LOS: 10 days   Mickeal Skinner, MD [facility]

## 2019-09-15 NOTE — Progress Notes (Signed)
Referring Physician(s): Dr Sherrill Raring  Supervising Physician: Sandi Mariscal  Patient Status:  [facility] - In-pt  Chief Complaint:  Abdominal/pelvic abscess  Subjective:  1/30 procedure: Technically successful CT guided placed of a 10 Fr drainage catheter placement into the perihepatic fluid collection yielding 10 cc of bloody, minimally complex fluid.   Technically successful CT guided placed of a 10 Fr drainage catheter placement into the pelvic abscess via right transgluteal approach yielding 25 cc of purulent appearing fluid.  Pt still painful Answers all questions--  cooperative  Allergies: Patient has no known allergies.  Medications: Prior to Admission medications   Medication Sig Start Date End Date Taking? Authorizing Provider  dimenhyDRINATE (DRAMAMINE) 50 MG tablet Take 50 mg by mouth every 8 (eight) hours as needed.   Yes [provider]     Vital Signs: BP (!) 150/88 (BP Location: Left Arm)   Pulse 87   Temp 98.3 F (36.8 C)   Resp 18   Ht 5\' 11"  (1.803 m)   Wt 175 lb (79.4 kg)   SpO2 95%   BMI 24.41 kg/m   Physical Exam Vitals reviewed.  Skin:    General: Skin is warm and dry.     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 Consider follow-up adrenal protocol CT when the patient is able, within 1 year. This recommendation follows ACR consensus guidelines: Management of Incidental Adrenal Masses: A White Paper of the ACR Incidental Findings Committee. J Am Coll Radiol 2017;14:1038-1044. Electronically Signed   By: Zetta Bills M.D.   On: 09/13/2019 16:54   Result Date:  09/13/2019 CLINICAL DATA:  Post Graham patch, suspected abdominal abscess. EXAM: CT ABDOMEN AND PELVIS WITH CONTRAST TECHNIQUE: Multidetector CT imaging of the abdomen and pelvis was performed using the standard protocol following bolus administration of intravenous contrast. CONTRAST:  121mL OMNIPAQUE IOHEXOL 300 MG/ML  SOLN COMPARISON:  09/05/2019 FINDINGS: Lower chest: Small right-sided pleural effusion is with basilar atelectasis. Volume loss on the left. Mildly heterogeneous enhancement of volume loss in the right chest. Hepatobiliary: Small area of low attenuation in the central liver left hepatic lobe likely small cysts. Lenticular fluid collection mixed with gas in the high right pericolic gutter tracking along the liver margin and above the right hepatic margin with a small locule of gas seen anterior to the liver likely within the superior margin of this collection in the subdiaphragmatic portion collection measures 6.5 x 3.0 x 5.3 cm. Portal vein is patent into the liver. Pancreas: Pancreas is normal, no sign of suspicious lesion. There is some surrounding stranding, presumably related to recent postoperative change and perforated ulcer. Spleen: Spleen is normal size without focal lesion. Adrenals/Urinary Tract: Adrenal glands with right adrenal thickening up to a cm of the body of the adrenal. Mild nodularity of the left adrenal. Signs of symmetric renal enhancement. No signs of hydronephrosis. Stomach/Bowel: Marked gastric edema. Irregularity at the site of previous perforation. Abundant fat in the lesser sac at the site of omental patch. Small, focal outpouching along the gastric antrum measures approximately 0.9 x 0.8 cm and is  surrounded by fat with some edema in the fat. No focal fluid or gas tracking beyond this location. No suggestion of extraluminal contrast following oral contrast administration. No signs of small bowel obstruction but with mild to moderate distension of small bowel loops  Stranding about bowel loops in the pelvis is, associated with a pelvic collection showing peripheral enhancement. This collection measures 6.1 x 4.9 x 5.2 cm. Colon is predominantly contrast filled with areas of alternating under distension and dilation. There is irregularity of the cecum, cecum is better distended on today's study than it was on the initial scan of September 05, 2019. Thickening of the cecum below the ileocecal valve, wall thickness suggested up to 1.7 cm. Significance uncertain on today's study given surrounding inflammation though this appears semi annular. Vascular/Lymphatic: Patent abdominal vasculature. No sign of adenopathy in the upper abdomen or retroperitoneum. No signs of pelvic lymphadenopathy. Reproductive: Prostatomegaly with heterogeneity, nonspecific finding on CT. Prostate fills the lower pelvis measuring 5.5 by 5.9 cm. Other: Midline postoperative changes with dressing over the anterior abdominal wall, healing via secondary intention. Small locule of gas deep to the midline incision only 4 mm without fluid along the midline incision or significant free intraperitoneal air. Gas seen on outside the bowel appears to be contained within abdominal collections which are described above. 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 No acute bone finding or destructive bone process. IMPRESSION: 1. Peripherally enhancing fluid collections in the pelvis and inferior to liver tracking along the right hemi liver, suspicious for infected collection/abscess following previous gastric perforation. 2. Thickening of the cecum below the ileocecal valve, wall thickness suggested up to 1.7 cm. Significance uncertain on today's study given surrounding inflammation. This is however suspicious for colonic neoplasm, suggest follow-up colonoscopy or correlation with previous colonoscopy results if recent and available. 3. Tiny locule of gas along  the anterior abdomen without significant free air. 4. Signs of previous Graham patch with irregularity along the lesser curvature of the stomach., small focal outpouching also seen on the previous upper GI compatible with site of perforation and overlying Graham patch. 5. Small right pleural effusion with heterogeneous enhancement of collapsed lung raising the question of aspiration or developing pneumonia. 6. Appendix shown to be enhancing and with signs of presumed inflammation on the prior study. Appendix obscured on today's exam by surrounding inflammation. Attention on follow-up. 7. Signs of atelectasis in the left chest. 8. 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 9. Prostatomegaly. 10. These results were called by telephone at the time of interpretation on 09/13/2019 at 12:46 pm to provider [facility] , who verbally acknowledged these results. Electronically Signed: By: Zetta Bills M.D. On: 09/13/2019 12:50   CT IMAGE GUIDED DRAINAGE BY PERCUTANEOUS CATHETER  Result Date: 09/14/2019 INDICATION: History of gastric ulcer perforation, postoperative repair, now with abdominal and pelvic fluid collections. Please perform CT-guided percutaneous drainage catheter placement for infection source control purposes. EXAM: CT IMAGE GUIDED DRAINAGE BY PERCUTANEOUS CATHETER x2 COMPARISON:  CT abdomen and pelvis - 09/13/2019 MEDICATIONS: The patient is currently admitted to the hospital and receiving intravenous antibiotics. The antibiotics were administered within an appropriate time frame prior to the initiation of the procedure. ANESTHESIA/SEDATION: Moderate (conscious) sedation was employed during this procedure. A total of Versed 3 mg and Fentanyl 150 mcg was administered intravenously. Moderate Sedation Time: 20 minutes. The patient's level of consciousness and vital signs were monitored continuously by radiology nursing throughout the procedure under  my direct supervision. CONTRAST:  None  COMPLICATIONS: None immediate. PROCEDURE: Informed written consent was obtained from the patient after a discussion of the risks, benefits and alternatives to treatment. The patient was initially placed supine on the CT gantry and a pre procedural CT was performed re-demonstrating the known abscess/fluid collection within the right upper abdominal quadrant, perihepatic space with dominant component measuring approximate 5.3 x 3.6 cm (image 57, series 2). The procedure was planned. A timeout was performed prior to the initiation of the procedure. The skin overlying the right upper abdominal quadrant was prepped and draped in the usual sterile fashion. The overlying soft tissues were anesthetized with 1% lidocaine with epinephrine. Appropriate trajectory was planned with the use of a 22 gauge spinal needle. An 18 gauge trocar needle was advanced into the abscess/fluid collection and a short Amplatz super stiff wire was coiled within the collection. Appropriate positioning was confirmed with a limited CT scan. The tract was serially dilated allowing placement of a 10 [place] all-purpose drainage catheter. Appropriate positioning was confirmed with a limited postprocedural CT scan. 10 cc of bloody, slightly complex fluid was aspirated. The tube was connected to a JP bulb and sutured in place. The patient was then positioned prone on the fluoroscopy table. Preprocedural imaging demonstrates unchanged size and appearance of the approximately 5.7 x 4.4 cm fluid collection within midline of the pelvis (image 35, series 2). The procedure was planned. The skin overlying the right buttocks was prepped and draped in usual sterile fashion. The overlying soft tissues were anesthetized with 1% lidocaine with epinephrine. Appropriate trajectory utilizing a right trans gluteal approach was planned with a 22 gauge spinal needle requiring a slight caudal to cranial trajectory. Next, an 63 gauge trocar needle was advanced into the  abscess/fluid collection and a short Amplatz wire was coiled within the collection. Appropriate position was confirmed with a limited CT scan. The track was serially dilated, ultimately allowing placement of a 10 French percutaneous drainage catheter. Appropriate position was confirmed with limited postprocedural CT scan. Approximately 25 cc of purulent appearing fluid was aspirated. The tube was connected a JP bulb and sutured in place. A dressing was applied. Dressings were applied. The patient tolerated the above procedures well without immediate postprocedural complication. IMPRESSION: 1. Successful CT guided placement of a 10 [place] all purpose drain catheter into the perihepatic space fluid collection with aspiration of 10 mL of bloody, slightly complex fluid. 2. Successful CT-guided placement of a 10 French all-purpose drainage catheter into pelvic fluid collection via right trans gluteal approach with aspiration of 25 cc of purulent appearing fluid. 3. Samples were sent separately from each collection to the laboratory for analysis. Electronically Signed   By: Sandi Mariscal M.D.   On: 09/14/2019 12:05   CT IMAGE GUIDED DRAINAGE BY PERCUTANEOUS CATHETER  Result Date: 09/14/2019 INDICATION: History of gastric ulcer perforation, postoperative repair, now with abdominal and pelvic fluid collections. Please perform CT-guided percutaneous drainage catheter placement for infection source control purposes. EXAM: CT IMAGE GUIDED DRAINAGE BY PERCUTANEOUS CATHETER x2 COMPARISON:  CT abdomen and pelvis - 09/13/2019 MEDICATIONS: The patient is currently admitted to the hospital and receiving intravenous antibiotics. The antibiotics were administered within an appropriate time frame prior to the initiation of the procedure. ANESTHESIA/SEDATION: Moderate (conscious) sedation was employed during this procedure. A total of Versed 3 mg and Fentanyl 150 mcg was administered intravenously. Moderate Sedation Time: 20 minutes.  The patient's level of consciousness and vital signs were monitored continuously by radiology  nursing throughout the procedure under my direct supervision. CONTRAST:  None COMPLICATIONS: None immediate. PROCEDURE: Informed written consent was obtained from the patient after a discussion of the risks, benefits and alternatives to treatment. The patient was initially placed supine on the CT gantry and a pre procedural CT was performed re-demonstrating the known abscess/fluid collection within the right upper abdominal quadrant, perihepatic space with dominant component measuring approximate 5.3 x 3.6 cm (image 57, series 2). The procedure was planned. A timeout was performed prior to the initiation of the procedure. The skin overlying the right upper abdominal quadrant was prepped and draped in the usual sterile fashion. The overlying soft tissues were anesthetized with 1% lidocaine with epinephrine. Appropriate trajectory was planned with the use of a 22 gauge spinal needle. An 18 gauge trocar needle was advanced into the abscess/fluid collection and a short Amplatz super stiff wire was coiled within the collection. Appropriate positioning was confirmed with a limited CT scan. The tract was serially dilated allowing placement of a 10 [place] all-purpose drainage catheter. Appropriate positioning was confirmed with a limited postprocedural CT scan. 10 cc of bloody, slightly complex fluid was aspirated. The tube was connected to a JP bulb and sutured in place. The patient was then positioned prone on the fluoroscopy table. Preprocedural imaging demonstrates unchanged size and appearance of the approximately 5.7 x 4.4 cm fluid collection within midline of the pelvis (image 35, series 2). The procedure was planned. The skin overlying the right buttocks was prepped and draped in usual sterile fashion. The overlying soft tissues were anesthetized with 1% lidocaine with epinephrine. Appropriate trajectory utilizing a right  trans gluteal approach was planned with a 22 gauge spinal needle requiring a slight caudal to cranial trajectory. Next, an 2 gauge trocar needle was advanced into the abscess/fluid collection and a short Amplatz wire was coiled within the collection. Appropriate position was confirmed with a limited CT scan. The track was serially dilated, ultimately allowing placement of a 10 French percutaneous drainage catheter. Appropriate position was confirmed with limited postprocedural CT scan. Approximately 25 cc of purulent appearing fluid was aspirated. The tube was connected a JP bulb and sutured in place. A dressing was applied. Dressings were applied. The patient tolerated the above procedures well without immediate postprocedural complication. IMPRESSION: 1. Successful CT guided placement of a 10 [place] all purpose drain catheter into the perihepatic space fluid collection with aspiration of 10 mL of bloody, slightly complex fluid. 2. Successful CT-guided placement of a 10 French all-purpose drainage catheter into pelvic fluid collection via right trans gluteal approach with aspiration of 25 cc of purulent appearing fluid. 3. Samples were sent separately from each collection to the laboratory for analysis. Electronically Signed   By: Sandi Mariscal M.D.   On: 09/14/2019 12:05    Labs:  CBC: Recent Labs    09/07/19 0431 09/12/19 0428 09/13/19 0413 09/14/19 0339  WBC 14.6* 13.6* 14.3* 14.5*  HGB 13.5 12.9* 11.4* 12.4*  HCT 41.7 40.0 35.7* 40.2  PLT 182 180 154 206    COAGS: Recent Labs    09/05/19 1058 09/05/19 1058 09/05/19 1531 09/06/19 0002 09/07/19 0431 09/14/19 0339  INR 4.8*   < > 1.6* 1.7* 1.5* 1.3*  APTT 78*  --   --   --   --   --    < > = values in this interval not displayed.    BMP: Recent Labs    09/07/19 0431 09/12/19 0428 09/13/19 0413 09/14/19 0339  NA  134* 135 136 137  K 3.9 3.7 3.1* 3.8  CL 102 97* 101 99  CO2 24 31 28 28   GLUCOSE 114* 139* 121* 112*  BUN 10 5*  <5* <5*  CALCIUM 7.7* 7.9* 7.6* 7.9*  CREATININE 0.91 0.93 0.72 0.87  GFRNONAA >60 >60 >60 >60  GFRAA >60 >60 >60 >60    LIVER FUNCTION TESTS: Recent Labs    09/05/19 0828 09/06/19 0002  BILITOT 1.1 0.9  AST 29 22  ALT 17 16  ALKPHOS 85 48  PROT 7.4 5.1*  ALBUMIN 3.8 2.7*    Assessment and Plan:  Abdominal/pevic abscess Drain x 2 OP cloudy bloody Will follow   Electronically Signed: Lavonia Drafts, PA-C 09/15/2019, 1:17 PM   I spent a total of 15 Minutes at the the patient's bedside AND on the patient's hospital floor or unit, greater than 50% of which was counseling/coordinating care for abdominal/pelvic drains

## 2019-09-15 NOTE — Progress Notes (Signed)
Patient's temp elevated. Encouraged patient to use IS/TCDB x10. Patient still lethargic but awakens fully w/ conversation. Encouraging po intake and deep breaths. Will cont to monitor.

## 2019-09-15 NOTE — Progress Notes (Signed)
Physical Therapy Treatment Patient Details Name: Albert Love MRN: [id number] DOB: 1940/09/11 Today's Date: 09/15/2019    History of Present Illness 79 yo male admitted with abdominal pain, perforated ulcer.  s/p exploratory laparotomy, gastric biopsy, omental pedicle flap and  repair of perforated pyloric ulcer    PT Comments    Pt reported having pain/spasms this morning. He did not feel up to ambulating in hallway this a.m. He reported he walked last night with nursing. Will continue to follow.    Follow Up Recommendations  No PT follow up     Equipment Recommendations  (continuing to assess)    Recommendations for Other Services       Precautions / Restrictions Precautions Precautions: Fall Precaution Comments: abd sx; jp drain (R) Restrictions Weight Bearing Restrictions: No    Mobility  Bed Mobility   Bed Mobility: Sit to Supine;Supine to Sit   Supine to Sit: Min guard; HOB elevated   Sit to supine: Min guard;HOB elevated   General bed mobility comments: Increased time.  Transfers Overall transfer level: Needs assistance Equipment used: 1 person hand held assist Transfers: Sit to/from Stand Sit to Stand: From elevated surface;Min assist         General transfer comment: Assist to rise, steady.  Ambulation/Gait     Assistive device: 1 person hand held assist       General Gait Details: side steps along side of bed. Pt declined ambulation 2* pain.   Stairs             Wheelchair Mobility    Modified Rankin (Stroke Patients Only)       Balance Overall balance assessment: Needs assistance           Standing balance-Leahy Scale: Fair                              Cognition Arousal/Alertness: Awake/alert Behavior During Therapy: WFL for tasks assessed/performed Overall Cognitive Status: Within Functional Limits for tasks assessed                                        Exercises      General  Comments        Pertinent Vitals/Pain Pain Assessment: Faces Faces Pain Scale: Hurts even more Pain Location: abdomen Pain Descriptors / Indicators: Spasm;Discomfort;Sore Pain Intervention(s): Limited activity within patient's tolerance;Monitored during session;Repositioned    Home Living                      Prior Function            PT Goals (current goals can now be found in the care plan section) Progress towards PT goals: Progressing toward goals    Frequency    Min 3X/week      PT Plan Current plan remains appropriate    Co-evaluation              AM-PAC PT "6 Clicks" Mobility   Outcome Measure  Help needed turning from your back to your side while in a flat bed without using bedrails?: A Little Help needed moving from lying on your back to sitting on the side of a flat bed without using bedrails?: A Little Help needed moving to and from a bed to a chair (including a wheelchair)?: A Little Help needed standing up from  a chair using your arms (e.g., wheelchair or bedside chair)?: A Little Help needed to walk in hospital room?: A Little Help needed climbing 3-5 steps with a railing? : A Little 6 Click Score: 18    End of Session   Activity Tolerance: Patient limited by pain Patient left: in bed;with call bell/phone within reach   PT Visit Diagnosis: Difficulty in walking, not elsewhere classified (R26.2);Pain Pain - part of body: (abdomen)     Time: [id number] PT Time Calculation (min) (ACUTE ONLY): 15 min  Charges:  $Gait Training: 8-22 mins                         Doreatha Massed, PT Acute Rehabilitation

## 2019-09-16 ENCOUNTER — Inpatient Hospital Stay (HOSPITAL_COMMUNITY): Payer: Medicare PPO

## 2019-09-16 LAB — BASIC METABOLIC PANEL
Anion gap: 10 (ref 5–15)
BUN: 10 mg/dL (ref 8–23)
CO2: 27 mmol/L (ref 22–32)
Calcium: 8 mg/dL — ABNORMAL LOW (ref 8.9–10.3)
Chloride: 98 mmol/L (ref 98–111)
Creatinine, Ser: 1.08 mg/dL (ref 0.61–1.24)
GFR calc Af Amer: >60 mL/min (ref >60)
GFR calc non Af Amer: >60 mL/min (ref >60)
Glucose, Bld: 120 mg/dL — ABNORMAL HIGH (ref 70–99)
Potassium: 3.8 mmol/L (ref 3.5–5.1)
Sodium: 135 mmol/L (ref 135–145)

## 2019-09-16 LAB — CBC
HCT: 39.2 % (ref 39.0–52.0)
Hemoglobin: 12.4 g/dL — ABNORMAL LOW (ref 13.0–17.0)
MCH: 26.3 pg (ref 26.0–34.0)
MCHC: 31.6 g/dL (ref 30.0–36.0)
MCV: 83.2 fL (ref 80.0–100.0)
Platelets: 147 10*3/uL — ABNORMAL LOW (ref 150–400)
RBC: 4.71 MIL/uL (ref 4.22–5.81)
RDW: 14.3 % (ref 11.5–15.5)
WBC: 14.8 10*3/uL — ABNORMAL HIGH (ref 4.0–10.5)
nRBC: 0 % (ref 0.0–0.2)

## 2019-09-16 IMAGING — DX DG CHEST 1V PORT
1 series · 1 of 1 positions shown · non-contrast
Comparison: [DATE]

CLINICAL DATA: Worsening shortness of breath today.

EXAM:
PORTABLE CHEST 1 VIEW

[chest ap]
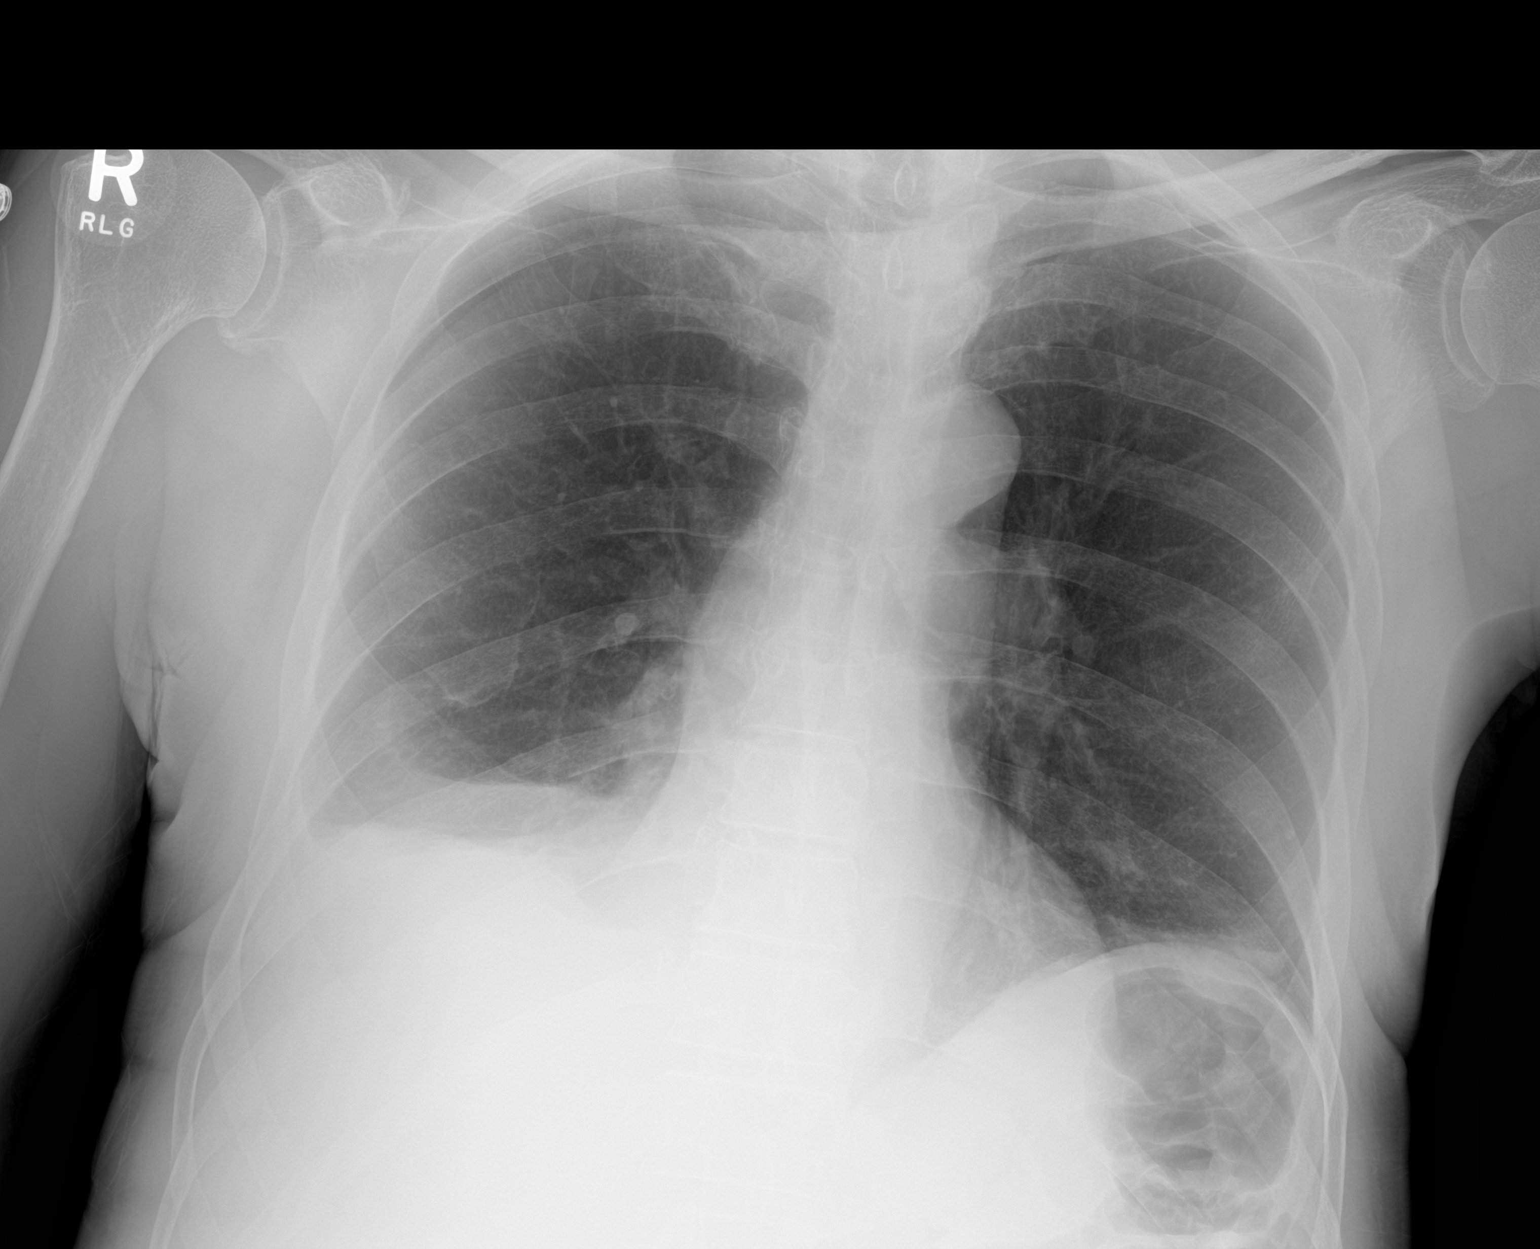

[1 of 1 positions shown; findings below may reference images not displayed]

FINDINGS: Heart size remains normal. Mediastinal shadows are normal. Minimal
atelectasis at the left lung base. Enlarged effusion on the right
with pendant atelectasis of the right lower lung. Upper lungs remain
clear.
IMPRESSION: Enlarging effusion on the right with right base atelectasis. Minimal
atelectasis at the left lung base.

## 2019-09-16 MED ORDER — OXYCODONE HCL 5 MG PO TABS
5.0000 mg | ORAL_TABLET | ORAL | Status: DC | PRN
  Administered 2019-09-16 – 2019-09-20 (×10): 5 mg via ORAL
  Filled 2019-09-16 (×11): qty 1

## 2019-09-16 MED ORDER — FLUCONAZOLE IN SODIUM CHLORIDE 400-0.9 MG/200ML-% IV SOLN
400.0000 mg | Freq: Once | INTRAVENOUS | Status: AC
  Administered 2019-09-16: 400 mg via INTRAVENOUS
  Filled 2019-09-16: qty 200

## 2019-09-16 MED ORDER — ENSURE ENLIVE PO LIQD
237.0000 mL | Freq: Four times a day (QID) | ORAL | Status: DC
  Administered 2019-09-16 – 2019-09-20 (×9): 237 mL via ORAL

## 2019-09-16 MED ORDER — POLYETHYLENE GLYCOL 3350 17 G PO PACK
17.0000 g | PACK | Freq: Every day | ORAL | Status: DC
  Administered 2019-09-16 – 2019-09-18 (×2): 17 g via ORAL
  Filled 2019-09-16 (×6): qty 1

## 2019-09-16 MED ORDER — HYDROMORPHONE HCL 1 MG/ML IJ SOLN
0.5000 mg | INTRAMUSCULAR | Status: DC | PRN
  Administered 2019-09-17 – 2019-09-20 (×4): 0.5 mg via INTRAVENOUS
  Filled 2019-09-16 (×4): qty 0.5

## 2019-09-16 MED ORDER — FLUCONAZOLE 100MG IVPB
100.0000 mg | INTRAVENOUS | Status: DC
  Administered 2019-09-17 – 2019-09-18 (×2): 100 mg via INTRAVENOUS
  Filled 2019-09-16 (×3): qty 50

## 2019-09-16 MED ORDER — METOCLOPRAMIDE HCL 5 MG/ML IJ SOLN
10.0000 mg | Freq: Four times a day (QID) | INTRAMUSCULAR | Status: DC
  Administered 2019-09-16 – 2019-09-18 (×9): 10 mg via INTRAVENOUS
  Filled 2019-09-16 (×8): qty 2

## 2019-09-16 NOTE — Care Management Important Message (Signed)
Important Message  Patient Details IM Letter given to Velva Harman RN Case Manager to present to the Patient Name: Albert Love MRN: [id number] Date of Birth: 27-Dec-1940   Medicare Important Message Given:  Yes     Kerin Salen 09/16/2019, 11:00 AM

## 2019-09-16 NOTE — Progress Notes (Signed)
Referring Physician(s): Dr. Kieth Brightly  Supervising Physician: Aletta Edouard  Patient Status:  [facility] - In-pt  Chief Complaint: Follow up perihepatic and right TG drain placement 1/30  Subjective:  Patient leaning over bedside table laying on a pillow, he denies any complaint - states this is just a comfortable position for him. He denies any pain at the drain insertion sites.  Allergies: Patient has no known allergies.  Medications: Prior to Admission medications   Medication Sig Start Date End Date Taking? Authorizing Provider  dimenhyDRINATE (DRAMAMINE) 50 MG tablet Take 50 mg by mouth every 8 (eight) hours as needed.   Yes [provider]     Vital Signs: BP 116/67   Pulse (!) 103   Temp 98.8 F (37.1 C) (Oral)   Resp 16   Ht 5\' 11"  (1.803 m)   Wt 175 lb (79.4 kg)   SpO2 98%   BMI 24.41 kg/m   Physical Exam Vitals and nursing note reviewed.  Constitutional:      General: He is not in acute distress. HENT:     Head: Normocephalic.  Cardiovascular:     Rate and Rhythm: Tachycardia present.  Pulmonary:     Effort: Pulmonary effort is normal.  Abdominal:     Comments: (+) Right TG drain to suction with scant serosanguineous output in bulb; flushes easily but difficult to aspirate. Insertion site unremarkable, suture and stat lock in tact. (+) RUQ drain to suction with scant serosanguineous output in bulb; flushes easily but difficult to aspirate. Insertion site unremarkable, suture and stat lock in tact.  Neurological:     Mental Status: He is alert.     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 Consider follow-up adrenal protocol CT when the patient is able, within 1 year. This recommendation follows ACR consensus guidelines: Management of Incidental Adrenal Masses: A White Paper of the ACR Incidental Findings Committee. J Am Coll  Radiol 2017;14:1038-1044. Electronically Signed   By: Zetta Bills M.D.   On: 09/13/2019 16:54   Result Date: 09/13/2019 CLINICAL DATA:  Post Graham patch, suspected abdominal abscess. EXAM: CT ABDOMEN AND PELVIS WITH CONTRAST TECHNIQUE: Multidetector CT imaging of the abdomen and pelvis was performed using the standard protocol following bolus administration of intravenous contrast. CONTRAST:  168mL OMNIPAQUE IOHEXOL 300 MG/ML  SOLN COMPARISON:  09/05/2019 FINDINGS: Lower chest: Small right-sided pleural effusion is with basilar atelectasis. Volume loss on the left. Mildly heterogeneous enhancement of volume loss in the right chest. Hepatobiliary: Small area of low attenuation in the central liver left hepatic lobe likely small cysts. Lenticular fluid collection mixed with gas in the high right pericolic gutter tracking along the liver margin and above the right hepatic margin with a small locule of gas seen anterior to the liver likely within the superior margin of this collection in the subdiaphragmatic portion collection measures 6.5 x 3.0 x 5.3 cm. Portal vein is patent into the liver. Pancreas: Pancreas is normal, no sign of suspicious lesion. There is some surrounding stranding, presumably related to recent postoperative change and perforated ulcer. Spleen: Spleen is normal size without focal lesion. Adrenals/Urinary Tract: Adrenal glands with right adrenal thickening up to a cm of the body of the adrenal. Mild nodularity of the left adrenal. Signs of symmetric renal enhancement. No signs of hydronephrosis. Stomach/Bowel: Marked gastric edema. Irregularity at the site of previous perforation. Abundant fat in  the lesser sac at the site of omental patch. Small, focal outpouching along the gastric antrum measures approximately 0.9 x 0.8 cm and is surrounded by fat with some edema in the fat. No focal fluid or gas tracking beyond this location. No suggestion of extraluminal contrast following oral contrast  administration. No signs of small bowel obstruction but with mild to moderate distension of small bowel loops Stranding about bowel loops in the pelvis is, associated with a pelvic collection showing peripheral enhancement. This collection measures 6.1 x 4.9 x 5.2 cm. Colon is predominantly contrast filled with areas of alternating under distension and dilation. There is irregularity of the cecum, cecum is better distended on today's study than it was on the initial scan of September 05, 2019. Thickening of the cecum below the ileocecal valve, wall thickness suggested up to 1.7 cm. Significance uncertain on today's study given surrounding inflammation though this appears semi annular. Vascular/Lymphatic: Patent abdominal vasculature. No sign of adenopathy in the upper abdomen or retroperitoneum. No signs of pelvic lymphadenopathy. Reproductive: Prostatomegaly with heterogeneity, nonspecific finding on CT. Prostate fills the lower pelvis measuring 5.5 by 5.9 cm. Other: Midline postoperative changes with dressing over the anterior abdominal wall, healing via secondary intention. Small locule of gas deep to the midline incision only 4 mm without fluid along the midline incision or significant free intraperitoneal air. Gas seen on outside the bowel appears to be contained within abdominal collections which are described above. 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 No acute bone finding or destructive bone process. IMPRESSION: 1. Peripherally enhancing fluid collections in the pelvis and inferior to liver tracking along the right hemi liver, suspicious for infected collection/abscess following previous gastric perforation. 2. Thickening of the cecum below the ileocecal valve, wall thickness suggested up to 1.7 cm. Significance uncertain on today's study given surrounding inflammation. This is however suspicious for colonic neoplasm, suggest follow-up  colonoscopy or correlation with previous colonoscopy results if recent and available. 3. Tiny locule of gas along the anterior abdomen without significant free air. 4. Signs of previous Graham patch with irregularity along the lesser curvature of the stomach., small focal outpouching also seen on the previous upper GI compatible with site of perforation and overlying Graham patch. 5. Small right pleural effusion with heterogeneous enhancement of collapsed lung raising the question of aspiration or developing pneumonia. 6. Appendix shown to be enhancing and with signs of presumed inflammation on the prior study. Appendix obscured on today's exam by surrounding inflammation. Attention on follow-up. 7. Signs of atelectasis in the left chest. 8. 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 9. Prostatomegaly. 10. These results were called by telephone at the time of interpretation on 09/13/2019 at 12:46 pm to provider [facility] , who verbally acknowledged these results. Electronically Signed: By: Zetta Bills M.D. On: 09/13/2019 12:50   DG CHEST PORT 1 VIEW  Result Date: 09/16/2019 CLINICAL DATA:  Worsening shortness of breath today. EXAM: PORTABLE CHEST 1 VIEW COMPARISON:  09/05/2019 FINDINGS: Heart size remains normal. Mediastinal shadows are normal. Minimal atelectasis at the left lung base. Enlarged effusion on the right with pendant atelectasis of the right lower lung. Upper lungs remain clear. IMPRESSION: Enlarging effusion on the right with right base atelectasis. Minimal atelectasis at the left lung base. Electronically Signed   By: Nelson Chimes M.D.   On: 09/16/2019 10:54   CT IMAGE GUIDED DRAINAGE BY PERCUTANEOUS CATHETER  Result Date: 09/14/2019 INDICATION: History of  gastric ulcer perforation, postoperative repair, now with abdominal and pelvic fluid collections. Please perform CT-guided percutaneous drainage catheter placement for infection source control purposes. EXAM: CT IMAGE  GUIDED DRAINAGE BY PERCUTANEOUS CATHETER x2 COMPARISON:  CT abdomen and pelvis - 09/13/2019 MEDICATIONS: The patient is currently admitted to the hospital and receiving intravenous antibiotics. The antibiotics were administered within an appropriate time frame prior to the initiation of the procedure. ANESTHESIA/SEDATION: Moderate (conscious) sedation was employed during this procedure. A total of Versed 3 mg and Fentanyl 150 mcg was administered intravenously. Moderate Sedation Time: 20 minutes. The patient's level of consciousness and vital signs were monitored continuously by radiology nursing throughout the procedure under my direct supervision. CONTRAST:  None COMPLICATIONS: None immediate. PROCEDURE: Informed written consent was obtained from the patient after a discussion of the risks, benefits and alternatives to treatment. The patient was initially placed supine on the CT gantry and a pre procedural CT was performed re-demonstrating the known abscess/fluid collection within the right upper abdominal quadrant, perihepatic space with dominant component measuring approximate 5.3 x 3.6 cm (image 57, series 2). The procedure was planned. A timeout was performed prior to the initiation of the procedure. The skin overlying the right upper abdominal quadrant was prepped and draped in the usual sterile fashion. The overlying soft tissues were anesthetized with 1% lidocaine with epinephrine. Appropriate trajectory was planned with the use of a 22 gauge spinal needle. An 18 gauge trocar needle was advanced into the abscess/fluid collection and a short Amplatz super stiff wire was coiled within the collection. Appropriate positioning was confirmed with a limited CT scan. The tract was serially dilated allowing placement of a 10 [place] all-purpose drainage catheter. Appropriate positioning was confirmed with a limited postprocedural CT scan. 10 cc of bloody, slightly complex fluid was aspirated. The tube was connected  to a JP bulb and sutured in place. The patient was then positioned prone on the fluoroscopy table. Preprocedural imaging demonstrates unchanged size and appearance of the approximately 5.7 x 4.4 cm fluid collection within midline of the pelvis (image 35, series 2). The procedure was planned. The skin overlying the right buttocks was prepped and draped in usual sterile fashion. The overlying soft tissues were anesthetized with 1% lidocaine with epinephrine. Appropriate trajectory utilizing a right trans gluteal approach was planned with a 22 gauge spinal needle requiring a slight caudal to cranial trajectory. Next, an 92 gauge trocar needle was advanced into the abscess/fluid collection and a short Amplatz wire was coiled within the collection. Appropriate position was confirmed with a limited CT scan. The track was serially dilated, ultimately allowing placement of a 10 French percutaneous drainage catheter. Appropriate position was confirmed with limited postprocedural CT scan. Approximately 25 cc of purulent appearing fluid was aspirated. The tube was connected a JP bulb and sutured in place. A dressing was applied. Dressings were applied. The patient tolerated the above procedures well without immediate postprocedural complication. IMPRESSION: 1. Successful CT guided placement of a 10 [place] all purpose drain catheter into the perihepatic space fluid collection with aspiration of 10 mL of bloody, slightly complex fluid. 2. Successful CT-guided placement of a 10 French all-purpose drainage catheter into pelvic fluid collection via right trans gluteal approach with aspiration of 25 cc of purulent appearing fluid. 3. Samples were sent separately from each collection to the laboratory for analysis. Electronically Signed   By: Sandi Mariscal M.D.   On: 09/14/2019 12:05   CT IMAGE GUIDED DRAINAGE BY PERCUTANEOUS CATHETER  Result Date:  09/14/2019 INDICATION: History of gastric ulcer perforation, postoperative repair,  now with abdominal and pelvic fluid collections. Please perform CT-guided percutaneous drainage catheter placement for infection source control purposes. EXAM: CT IMAGE GUIDED DRAINAGE BY PERCUTANEOUS CATHETER x2 COMPARISON:  CT abdomen and pelvis - 09/13/2019 MEDICATIONS: The patient is currently admitted to the hospital and receiving intravenous antibiotics. The antibiotics were administered within an appropriate time frame prior to the initiation of the procedure. ANESTHESIA/SEDATION: Moderate (conscious) sedation was employed during this procedure. A total of Versed 3 mg and Fentanyl 150 mcg was administered intravenously. Moderate Sedation Time: 20 minutes. The patient's level of consciousness and vital signs were monitored continuously by radiology nursing throughout the procedure under my direct supervision. CONTRAST:  None COMPLICATIONS: None immediate. PROCEDURE: Informed written consent was obtained from the patient after a discussion of the risks, benefits and alternatives to treatment. The patient was initially placed supine on the CT gantry and a pre procedural CT was performed re-demonstrating the known abscess/fluid collection within the right upper abdominal quadrant, perihepatic space with dominant component measuring approximate 5.3 x 3.6 cm (image 57, series 2). The procedure was planned. A timeout was performed prior to the initiation of the procedure. The skin overlying the right upper abdominal quadrant was prepped and draped in the usual sterile fashion. The overlying soft tissues were anesthetized with 1% lidocaine with epinephrine. Appropriate trajectory was planned with the use of a 22 gauge spinal needle. An 18 gauge trocar needle was advanced into the abscess/fluid collection and a short Amplatz super stiff wire was coiled within the collection. Appropriate positioning was confirmed with a limited CT scan. The tract was serially dilated allowing placement of a 10 [place] all-purpose  drainage catheter. Appropriate positioning was confirmed with a limited postprocedural CT scan. 10 cc of bloody, slightly complex fluid was aspirated. The tube was connected to a JP bulb and sutured in place. The patient was then positioned prone on the fluoroscopy table. Preprocedural imaging demonstrates unchanged size and appearance of the approximately 5.7 x 4.4 cm fluid collection within midline of the pelvis (image 35, series 2). The procedure was planned. The skin overlying the right buttocks was prepped and draped in usual sterile fashion. The overlying soft tissues were anesthetized with 1% lidocaine with epinephrine. Appropriate trajectory utilizing a right trans gluteal approach was planned with a 22 gauge spinal needle requiring a slight caudal to cranial trajectory. Next, an 35 gauge trocar needle was advanced into the abscess/fluid collection and a short Amplatz wire was coiled within the collection. Appropriate position was confirmed with a limited CT scan. The track was serially dilated, ultimately allowing placement of a 10 French percutaneous drainage catheter. Appropriate position was confirmed with limited postprocedural CT scan. Approximately 25 cc of purulent appearing fluid was aspirated. The tube was connected a JP bulb and sutured in place. A dressing was applied. Dressings were applied. The patient tolerated the above procedures well without immediate postprocedural complication. IMPRESSION: 1. Successful CT guided placement of a 10 [place] all purpose drain catheter into the perihepatic space fluid collection with aspiration of 10 mL of bloody, slightly complex fluid. 2. Successful CT-guided placement of a 10 French all-purpose drainage catheter into pelvic fluid collection via right trans gluteal approach with aspiration of 25 cc of purulent appearing fluid. 3. Samples were sent separately from each collection to the laboratory for analysis. Electronically Signed   By: Sandi Mariscal M.D.    On: 09/14/2019 12:05    Labs:  CBC: Recent  Labs    09/12/19 0428 09/13/19 0413 09/14/19 0339 09/16/19 0346  WBC 13.6* 14.3* 14.5* 14.8*  HGB 12.9* 11.4* 12.4* 12.4*  HCT 40.0 35.7* 40.2 39.2  PLT 180 154 206 147*    COAGS: Recent Labs    09/05/19 1058 09/05/19 1058 09/05/19 1531 09/06/19 0002 09/07/19 0431 09/14/19 0339  INR 4.8*   < > 1.6* 1.7* 1.5* 1.3*  APTT 78*  --   --   --   --   --    < > = values in this interval not displayed.    BMP: Recent Labs    09/12/19 0428 09/13/19 0413 09/14/19 0339 09/16/19 0346  NA 135 136 137 135  K 3.7 3.1* 3.8 3.8  CL 97* 101 99 98  CO2 31 28 28 27   GLUCOSE 139* 121* 112* 120*  BUN 5* <5* <5* 10  CALCIUM 7.9* 7.6* 7.9* 8.0*  CREATININE 0.93 0.72 0.87 1.08  GFRNONAA >60 >60 >60 >60  GFRAA >60 >60 >60 >60    LIVER FUNCTION TESTS: Recent Labs    09/05/19 0828 09/06/19 0002  BILITOT 1.1 0.9  AST 29 22  ALT 17 16  ALKPHOS 85 48  PROT 7.4 5.1*  ALBUMIN 3.8 2.7*    Assessment and Plan:  79 y/o M s/p perihepatic and right TG drain placement 1/30 seen today for routine follow up.  Per I/O only 5 cc output from each drain in last 24H, yesterday RUQ with 20 cc output and TG with 60 cc output recorded - both drains flush easily on my exam, but are difficult to aspirate. Output appears serosanguineous without any previously noted purulence.  Given decrease in OP will repeat CT abd/pelvis w/contrast tomorrow to evaluate collections. Continue to flush TID with 5 cc NS, record output Qshift, dressing changes QD or PRN if soiled.  Please call IR with questions or concerns.  Electronically Signed: Joaquim Nam, PA-C 09/16/2019, 1:19 PM   I spent a total of 25 Minutes at the the patient's bedside AND on the patient's hospital floor or unit, greater than 50% of which was counseling/coordinating care for perihepatic and right TG drain follow up.

## 2019-09-16 NOTE — Progress Notes (Addendum)
Progress Note: General Surgery Service   Chief Complaint/Subjective: Hiccups which cause pain, some belching. +BM this am, nonbloody.  Objective: Vital signs in last 24 hours: Temp:  [99 F (37.2 C)-101.4 F (38.6 C)] 99.1 F (37.3 C) (02/01 0600) Pulse Rate:  [90-100] 100 (02/01 0600) Resp:  [17-20] 20 (02/01 0600) BP: (106-151)/(74-81) 106/75 (02/01 0600) SpO2:  [93 %-96 %] 96 % (02/01 0600) Last BM Date: 09/13/19  Intake/Output from previous day: 01/31 0701 - 02/01 0700 In: 2039.2 [P.O.:240; I.V.:1337.8; IV Piggyback:451.4] Out: 955 [Urine:945; Drains:10] Intake/Output this shift: No intake/output data recorded.  Gen: NAD  Resp: unlabored  Card: RR  Abd: soft, nondistended, tender along left hemiabdomen. drains with serous output.  Lab Results: CBC  Recent Labs    09/14/19 0339 09/16/19 0346  WBC 14.5* 14.8*  HGB 12.4* 12.4*  HCT 40.2 39.2  PLT 206 147*   BMET Recent Labs    09/14/19 0339 09/16/19 0346  NA 137 135  K 3.8 3.8  CL 99 98  CO2 28 27  GLUCOSE 112* 120*  BUN <5* 10  CREATININE 0.87 1.08  CALCIUM 7.9* 8.0*   PT/INR Recent Labs    09/14/19 0339  LABPROT 16.2*  INR 1.3*   ABG No results for input(s): PHART, HCO3 in the last 72 hours.  Invalid input(s): PCO2, PO2  Anti-infectives: Anti-infectives (From admission, onward)   Start     Dose/Rate Route Frequency Ordered Stop   09/13/19 1400  metroNIDAZOLE (FLAGYL) tablet 500 mg     500 mg Oral Every 8 hours 09/13/19 1041     09/13/19 1400  piperacillin-tazobactam (ZOSYN) IVPB 3.375 g     3.375 g 12.5 mL/hr over 240 Minutes Intravenous Every 8 hours 09/13/19 1322     09/13/19 1045  tetracycline (SUMYCIN) capsule 500 mg     500 mg Oral 4 times daily 09/13/19 1041     09/06/19 1600  anidulafungin (ERAXIS) 100 mg in sodium chloride 0.9 % 100 mL IVPB  Status:  Discontinued    Note to Pharmacy: Please change dosing to 100 mg after first day   100 mg 78 mL/hr over 100 Minutes  Intravenous Every 24 hours 09/05/19 1148 09/05/19 1456   09/05/19 2000  piperacillin-tazobactam (ZOSYN) IVPB 3.375 g  Status:  Discontinued     3.375 g 12.5 mL/hr over 240 Minutes Intravenous Every 8 hours 09/05/19 1456 09/12/19 0926   09/05/19 1700  piperacillin-tazobactam (ZOSYN) IVPB 3.375 g  Status:  Discontinued     3.375 g 12.5 mL/hr over 240 Minutes Intravenous Every 8 hours 09/05/19 1148 09/05/19 1456   09/05/19 1300  anidulafungin (ERAXIS) 200 mg in sodium chloride 0.9 % 200 mL IVPB  Status:  Discontinued     200 mg 78 mL/hr over 200 Minutes Intravenous  Once 09/05/19 1215 09/05/19 1456   09/05/19 1045  piperacillin-tazobactam (ZOSYN) IVPB 3.375 g     3.375 g 100 mL/hr over 30 Minutes Intravenous  Once 09/05/19 1042 09/05/19 1124      Medications: Scheduled Meds: . amLODipine  5 mg Oral Daily  . bismuth subsalicylate  30 mL Oral TID AC & HS  . enoxaparin (LOVENOX) injection  40 mg Subcutaneous Q24H  . feeding supplement (ENSURE ENLIVE)  237 mL Oral QID  . folic acid  1 mg Oral Daily  . lip balm  1 application Topical BID  . mouth rinse  15 mL Mouth Rinse BID  . metroNIDAZOLE  500 mg Oral Q8H  . multivitamin with  minerals  1 tablet Oral Q lunch  . pantoprazole  40 mg Oral BID  . polyethylene glycol  17 g Oral BID  . simethicone  80 mg Oral QID  . sodium chloride flush  5 mL Intracatheter Q8H  . tetracycline  500 mg Oral QID  . thiamine  100 mg Oral Daily   Or  . thiamine  100 mg Intravenous Daily   Continuous Infusions: . sodium chloride Stopped (09/06/19 0557)  . chlorproMAZINE (THORAZINE) IV Stopped (09/15/19 2036)  . dextrose 5 % and 0.45% NaCl Stopped (09/15/19 1852)  . methocarbamol (ROBAXIN) IV Stopped (09/15/19 1922)  . piperacillin-tazobactam (ZOSYN)  IV 3.375 g (09/16/19 0631)   PRN Meds:.sodium chloride, bisacodyl, chlorproMAZINE (THORAZINE) IV, diphenhydrAMINE, HYDROmorphone (DILAUDID) injection, magic mouthwash, methocarbamol (ROBAXIN) IV, metoprolol  tartrate, ondansetron **OR** ondansetron (ZOFRAN) IV, oxyCODONE, prochlorperazine  Assessment/Plan: s/p Procedure(s): EXPLORATORY LAPAROTOMY WITH GRAHAM PATCH AND GASTRIC BIOPSY 09/05/2019 Dr. Kae Heller -his progress has been very slow. Persistent leukocytosis and likely gastric ileus -try reglan -continue full liquids + boost -continue antibiotics/ PPI (H pylori +) -Drain micro- polymicrobial + few yeast- add diflucan -wean off narcotics -up to chair/ ambulate -pain control -DC CIWA -Shortness of breath- enlarging R effusion. Continue aggressive pulm toilet, repeat CXR tom, may need thoracentesis if symptoms worsening   LOS: 11 days   Clovis Riley, MD [facility]

## 2019-09-16 NOTE — Plan of Care (Signed)
  Problem: Clinical Measurements: Goal: Ability to maintain clinical measurements within normal limits will improve Outcome: Progressing Goal: Will remain free from infection Outcome: Progressing Goal: Diagnostic test results will improve Outcome: Progressing   Problem: Pain Managment: Goal: General experience of comfort will improve Outcome: Progressing   Problem: Skin Integrity: Goal: Risk for impaired skin integrity will decrease Outcome: Progressing

## 2019-09-17 ENCOUNTER — Encounter (HOSPITAL_COMMUNITY): Payer: Self-pay

## 2019-09-17 ENCOUNTER — Inpatient Hospital Stay (HOSPITAL_COMMUNITY): Payer: Medicare PPO

## 2019-09-17 LAB — BASIC METABOLIC PANEL
Anion gap: 10 (ref 5–15)
BUN: 11 mg/dL (ref 8–23)
CO2: 26 mmol/L (ref 22–32)
Calcium: 7.7 mg/dL — ABNORMAL LOW (ref 8.9–10.3)
Chloride: 98 mmol/L (ref 98–111)
Creatinine, Ser: 0.95 mg/dL (ref 0.61–1.24)
GFR calc Af Amer: >60 mL/min (ref >60)
GFR calc non Af Amer: >60 mL/min (ref >60)
Glucose, Bld: 110 mg/dL — ABNORMAL HIGH (ref 70–99)
Potassium: 4 mmol/L (ref 3.5–5.1)
Sodium: 134 mmol/L — ABNORMAL LOW (ref 135–145)

## 2019-09-17 LAB — CBC
HCT: 36.8 % — ABNORMAL LOW (ref 39.0–52.0)
Hemoglobin: 11.9 g/dL — ABNORMAL LOW (ref 13.0–17.0)
MCH: 26.5 pg (ref 26.0–34.0)
MCHC: 32.3 g/dL (ref 30.0–36.0)
MCV: 82 fL (ref 80.0–100.0)
Platelets: 266 10*3/uL (ref 150–400)
RBC: 4.49 MIL/uL (ref 4.22–5.81)
RDW: 14.4 % (ref 11.5–15.5)
WBC: 13.8 10*3/uL — ABNORMAL HIGH (ref 4.0–10.5)
nRBC: 0 % (ref 0.0–0.2)

## 2019-09-17 LAB — MAGNESIUM: Magnesium: 1.9 mg/dL (ref 1.7–2.4)

## 2019-09-17 LAB — PHOSPHORUS: Phosphorus: 3.2 mg/dL (ref 2.5–4.6)

## 2019-09-17 IMAGING — DX DG CHEST 1V PORT
1 series · 1 of 1 positions shown · non-contrast
Comparison: [DATE]

CLINICAL DATA: Follow-up pleural effusion.

EXAM:
PORTABLE CHEST 1 VIEW

[chest ap]
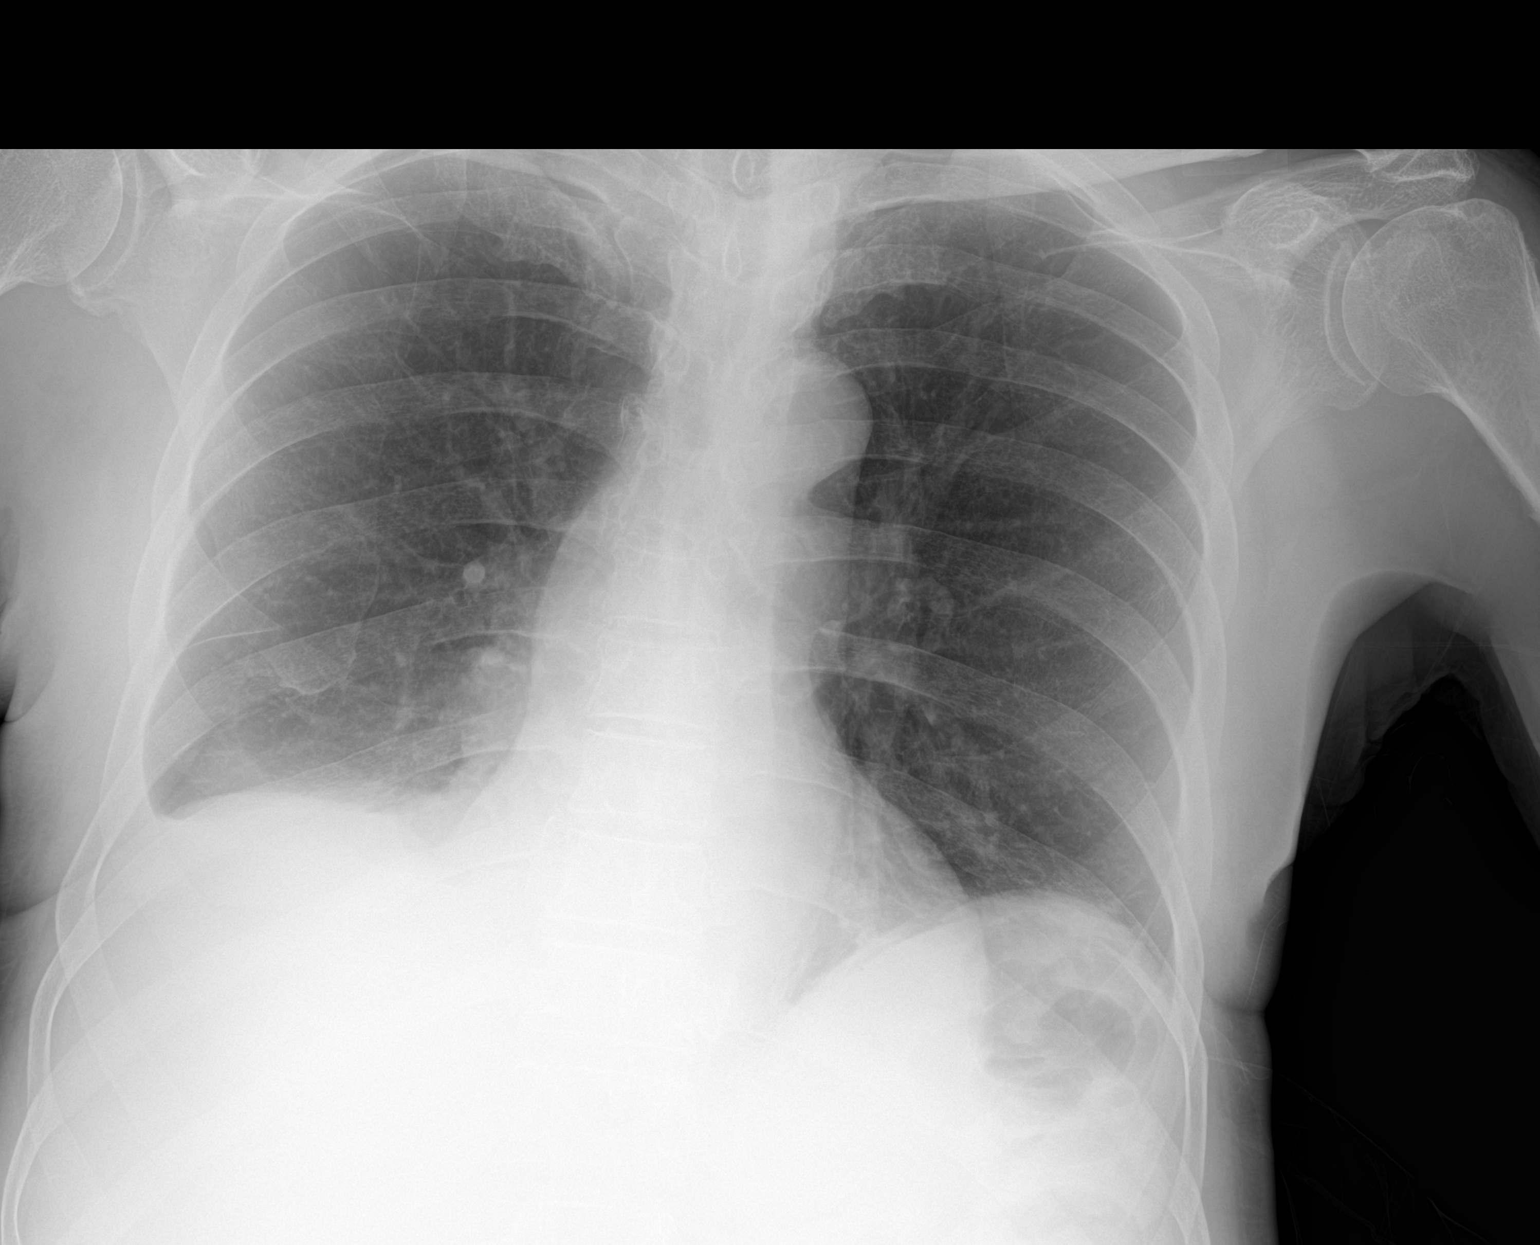

[1 of 1 positions shown; findings below may reference images not displayed]

FINDINGS: Persistent small right pleural effusion with overlying atelectasis.
Minimal streaky left basilar atelectasis is stable. No edema or
infiltrates. The cardiac silhouette, mediastinal and hilar contours
are normal and stable.
IMPRESSION: Persistent small right pleural effusion and overlying atelectasis.

## 2019-09-17 IMAGING — CT CT ABD-PELV W/ CM
2 of 5 series · 14 of 42 positions shown, 16 images · IV contrast (APPLIED)
Comparison: [DATE].

CLINICAL DATA: Follow-up fluid collections.

EXAM:
CT ABDOMEN AND PELVIS WITH CONTRAST
TECHNIQUE: Multidetector CT imaging of the abdomen and pelvis was performed
using the standard protocol following bolus administration of
intravenous contrast.
CONTRAST:  100mL OMNIPAQUE IOHEXOL 300 MG/ML  SOLN

[Series 2: axial st · axial · 0.76mm/px · z∈[-435,-45]mm · 11 of 94 slices shown, 13 images]
[im 8/94  soft-tissue]
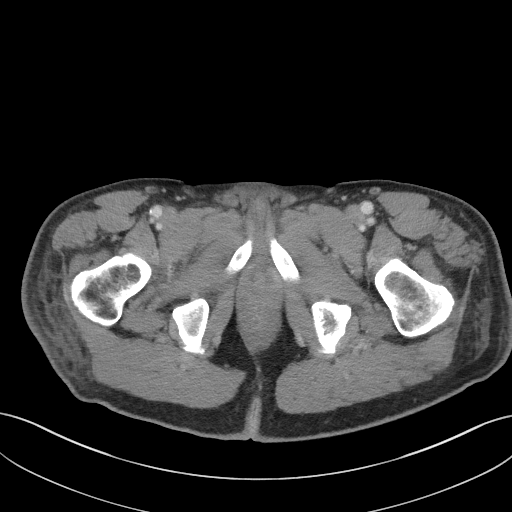
[im 8/94  bone]
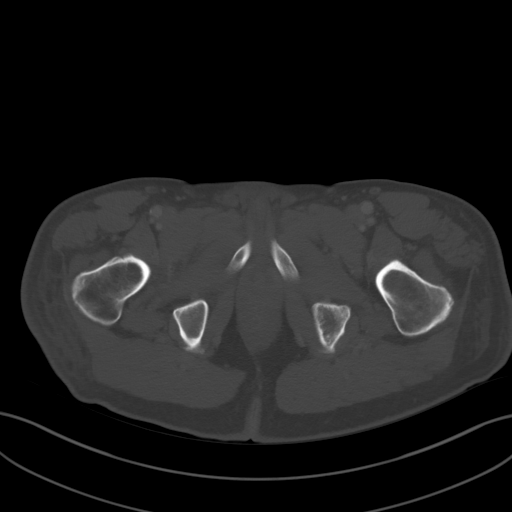
[im 16/94  soft-tissue]
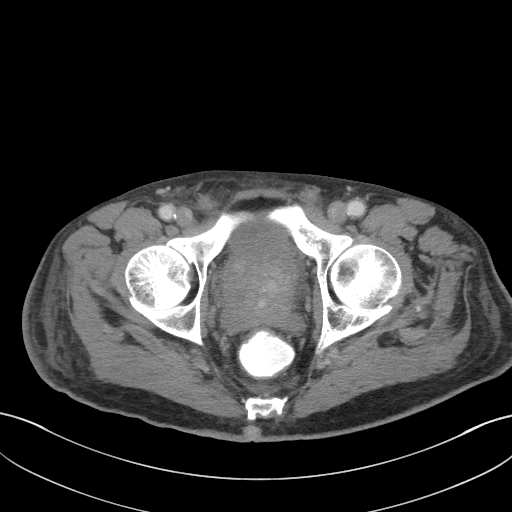
[im 24/94  soft-tissue]
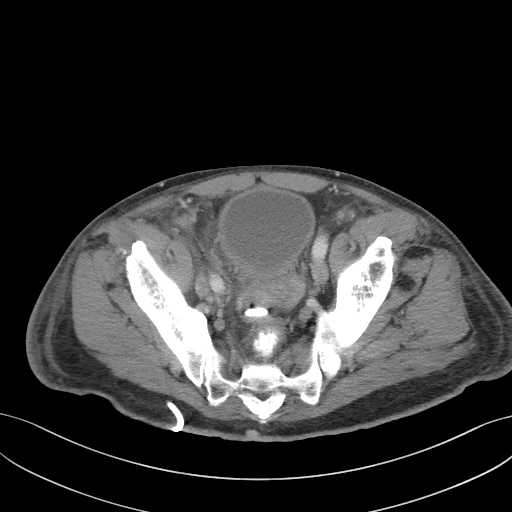
[im 32/94  soft-tissue]
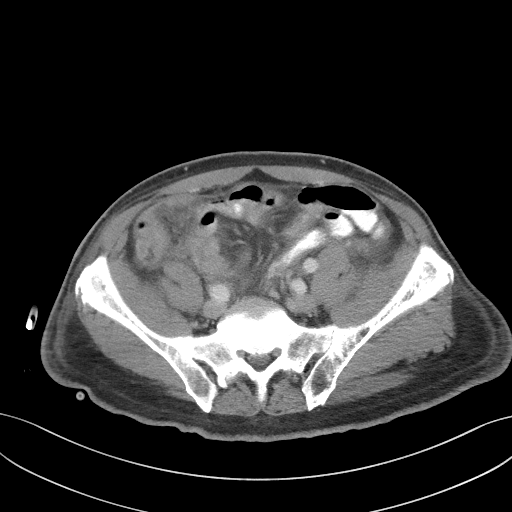
[im 39/94  soft-tissue]
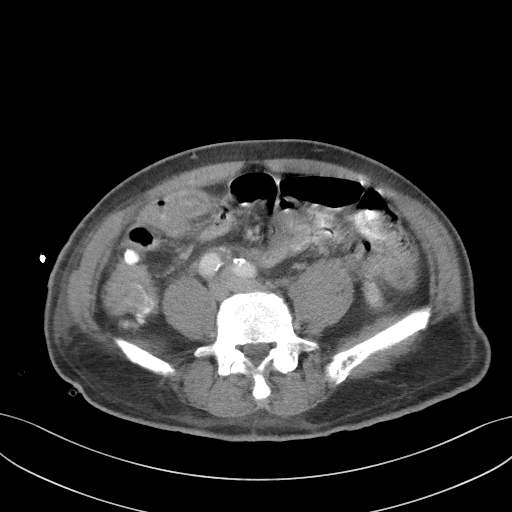
[im 47/94  soft-tissue]
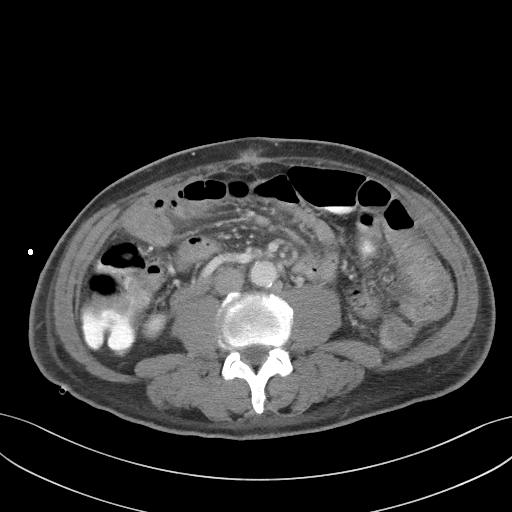
[im 55/94  soft-tissue]
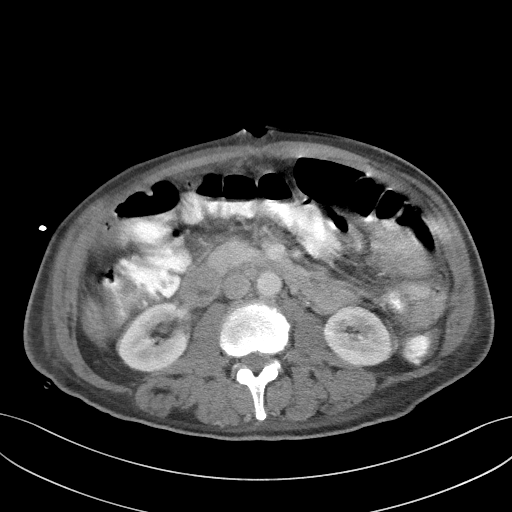
[im 63/94  soft-tissue]
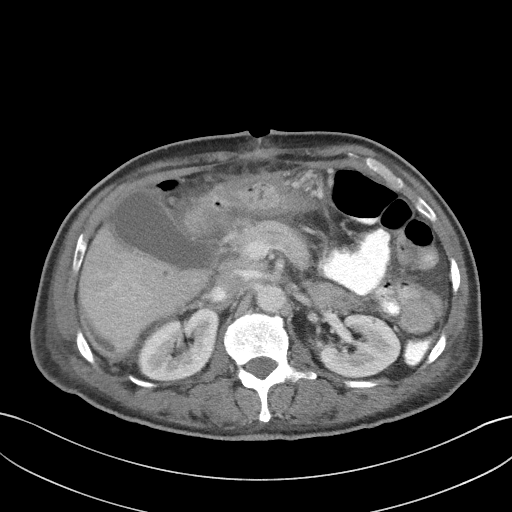
[im 70/94  soft-tissue]
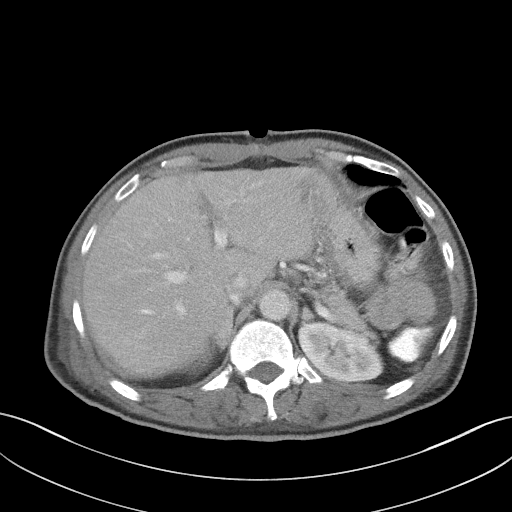
[im 70/94  bone]
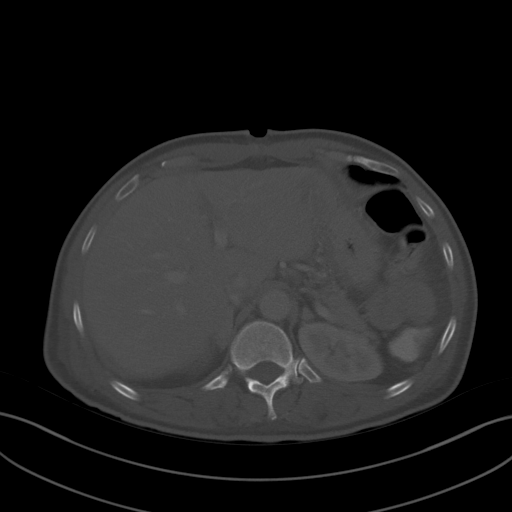
[im 78/94  soft-tissue]
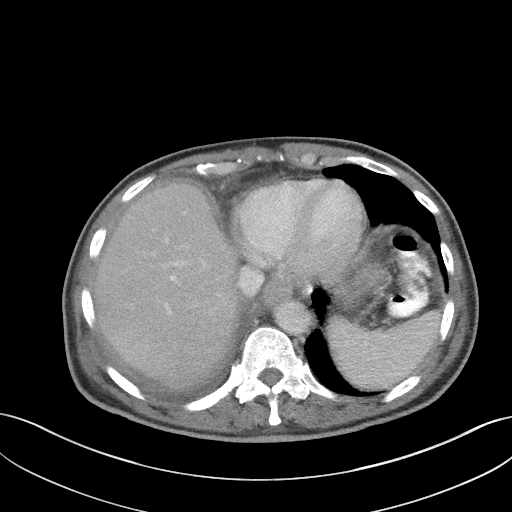
[im 86/94  soft-tissue]
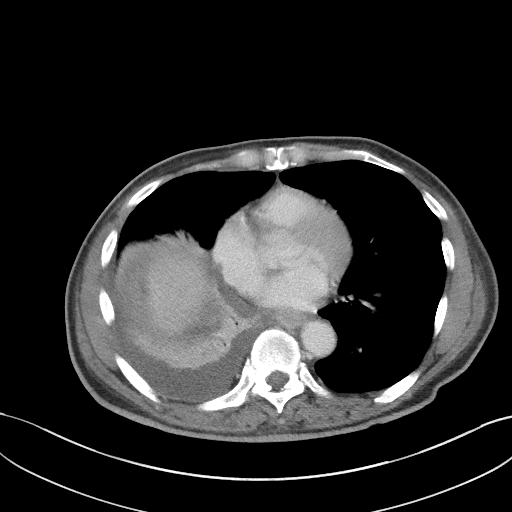

[Series 6: coronal st · coronal · 0.72mm/px · 3 of 92 slices shown]
[im 31/92  soft-tissue]
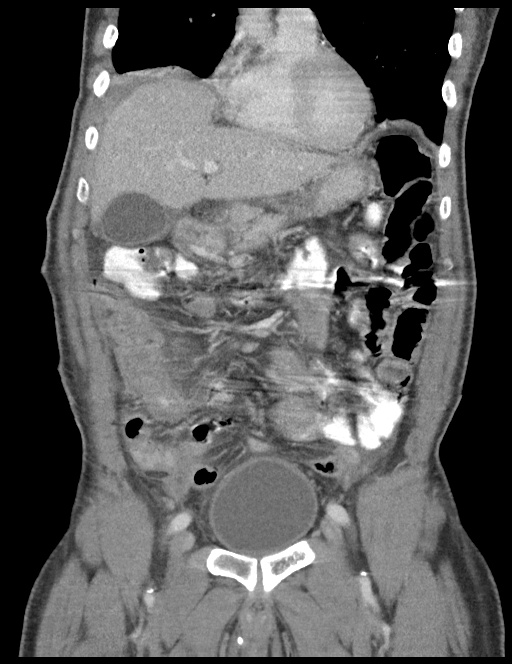
[im 41/92  soft-tissue]
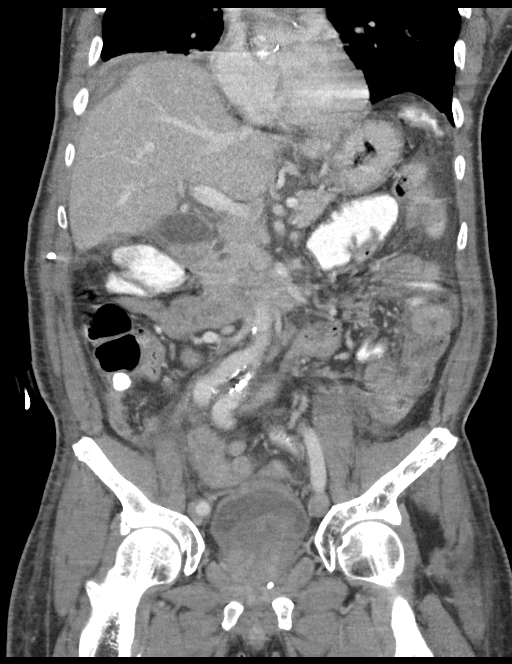
[im 51/92  soft-tissue]
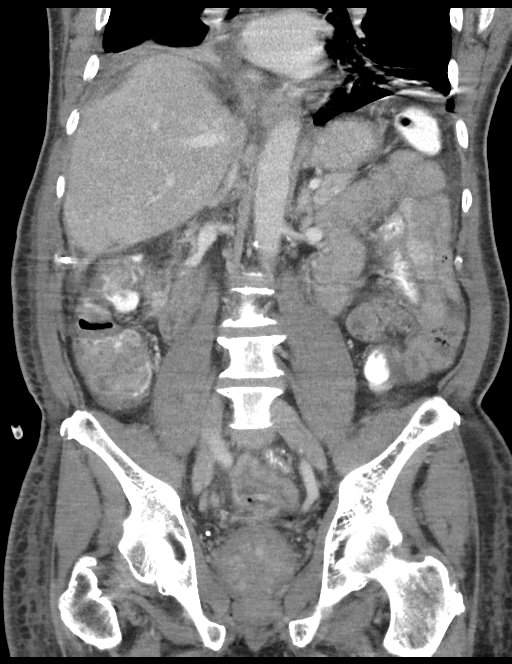

[14 of 42 positions shown; findings below may reference images not displayed]

FINDINGS: Lower chest: Small right pleural effusion is noted with adjacent
subsegmental atelectasis.

Hepatobiliary: No gallstones or biliary dilatation is noted. Stable
left hepatic cyst.

Pancreas: Unremarkable. No pancreatic ductal dilatation or
surrounding inflammatory changes.

Spleen: Normal in size without focal abnormality.

Adrenals/Urinary Tract: Stable right adrenal lesion as described on
prior exam. Left adrenal gland appears normal. No hydronephrosis or
renal obstruction is noted. No renal or ureteral calculi are noted.
Urinary bladder is unremarkable.

Stomach/Bowel: Mild gastric wall thickening is noted suggesting
inflammation or possibly edema. There is no evidence of bowel
obstruction. There remains wall thickening involving the cecum
inferiorly which may be inflammatory in etiology, but neoplasm
cannot be excluded. The appendix continues to demonstrate some
enhancement but is stable in size and appearance compared to prior
exam.

Vascular/Lymphatic: Aortic atherosclerosis. No enlarged abdominal or
pelvic lymph nodes.

Reproductive: Severely enlarged prostate gland is again noted and
unchanged.

Other: Interval placement of percutaneous drainage catheters in
fluid collections noted on prior exam inferior to right hepatic lobe
as well as in pre rectal space of the pelvis. These fluid
collections appear to be significantly smaller compared to prior
exam, with abnormality inferior to right hepatic lobe measuring
x 2.6 cm and abnormality in pre rectal space measuring 3.4 x 2.6 cm.

Musculoskeletal: Stable findings consistent with Paget's disease
seen involving the left side of the pelvis. No acute abnormality is
noted.
IMPRESSION: Interval placement of percutaneous drainage catheters into fluid
collections noted on prior exam inferior to the right hepatic lobe
and in the pre rectal space of the pelvis. These fluid collections
are significantly smaller compared to prior exam, although a small
amount of residual fluid does remain in each.

Mild gastric wall thickening is noted suggesting inflammation or
possibly edema. There also remains wall thickening involving the
inferior portion of the cecum which may be inflammatory in etiology,
but neoplasm cannot be excluded. Colonoscopy is recommended for
further evaluation at the appropriate time.

Stable appearance and enhancement of the appendix is noted which
potentially may be inflammatory in etiology, but continued attention
on follow-up is recommended.

Stable right adrenal lesion as described above.

Severely enlarged prostate gland which is stable.

Stable findings suggestive of Paget's disease involving the left
side of the pelvis.

Aortic Atherosclerosis ([GK]-[GK]).

## 2019-09-17 MED ORDER — IOHEXOL 300 MG/ML  SOLN
100.0000 mL | Freq: Once | INTRAMUSCULAR | Status: AC | PRN
  Administered 2019-09-17: 100 mL via INTRAVENOUS

## 2019-09-17 MED ORDER — SODIUM CHLORIDE (PF) 0.9 % IJ SOLN
INTRAMUSCULAR | Status: AC
  Filled 2019-09-17: qty 50

## 2019-09-17 NOTE — Progress Notes (Signed)
12 Days Post-Op    CC: Abdominal pain and nausea  Subjective: Patient sitting up in chair his midline incision looks good.  He says he is not eating much he just does not have any appetite.  It would appear to be liquids do not really appeal he would really like a steak and baked potato.  Drainage from both JPs is clear.  He is scheduled go down for CT now ordered by IR.  He is in no respiratory distress and lungs are relatively clear despite an effusion noted at his right base.  Objective: Vital signs in last 24 hours: Temp:  [98 F (36.7 C)-98.9 F (37.2 C)] 98.9 F (37.2 C) (02/02 0515) Pulse Rate:  [82-103] 88 (02/02 0515) Resp:  [16-20] 16 (02/02 0515) BP: (116-143)/(67-85) 143/78 (02/02 0515) SpO2:  [94 %-98 %] 97 % (02/02 0515) Last BM Date: 09/16/19 240 p.o. recorded 500 IV 350 urine recorded Right upper quadrant drain 42 cc Second drain 45 cc Afebrile vital signs are stable T-max 99.1 NA 134, creatinine 0.95, calcium 7.7, WBC 13.8, CT scan 1/29: Intake/Output from previous day: 02/01 0701 - 02/02 0700 In: 718 [P.O.:240; I.V.:226.7; IV Piggyback:211.4] Out: 437 [Urine:350; Drains:87] Intake/Output this shift: Total I/O In: -  Out: 120 [Urine:120]  General appearance: alert, cooperative and no distress Resp: clear to auscultation bilaterally Cardio: Regular rate and rhythm GI: Midline incision looks fine.  Soft, positive bowel sounds.  He had one loose stool and was incontinent this morning followed by 1 that he could control.  Both stools were very l liquid. Extremities: extremities normal, atraumatic, no cyanosis or edema  Lab Results:  Recent Labs    09/16/19 0346 09/17/19 0428  WBC 14.8* 13.8*  HGB 12.4* 11.9*  HCT 39.2 36.8*  PLT 147* 266    BMET Recent Labs    09/16/19 0346 09/17/19 0428  NA 135 134*  K 3.8 4.0  CL 98 98  CO2 27 26  GLUCOSE 120* 110*  BUN 10 11  CREATININE 1.08 0.95  CALCIUM 8.0* 7.7*   PT/INR No results for input(s):  LABPROT, INR in the last 72 hours.  No results for input(s): AST, ALT, ALKPHOS, BILITOT, PROT, ALBUMIN in the last 168 hours.   Lipase     Component Value Date/Time   LIPASE 21 09/05/2019 0828   Prior to Admission medications   Medication Sig Start Date End Date Taking? Authorizing Provider  dimenhyDRINATE (DRAMAMINE) 50 MG tablet Take 50 mg by mouth every 8 (eight) hours as needed.   Yes [provider]      Medications: . amLODipine  5 mg Oral Daily  . bismuth subsalicylate  30 mL Oral TID AC & HS  . enoxaparin (LOVENOX) injection  40 mg Subcutaneous Q24H  . feeding supplement (ENSURE ENLIVE)  237 mL Oral QID  . folic acid  1 mg Oral Daily  . lip balm  1 application Topical BID  . mouth rinse  15 mL Mouth Rinse BID  . metoCLOPramide (REGLAN) injection  10 mg Intravenous Q6H  . metroNIDAZOLE  500 mg Oral Q8H  . multivitamin with minerals  1 tablet Oral Q lunch  . pantoprazole  40 mg Oral BID  . polyethylene glycol  17 g Oral Daily  . sodium chloride flush  5 mL Intracatheter Q8H  . tetracycline  500 mg Oral QID  . thiamine  100 mg Oral Daily   Or  . thiamine  100 mg Intravenous Daily   . sodium  chloride Stopped (09/06/19 0557)  . chlorproMAZINE (THORAZINE) IV 12.5 mg (09/16/19 2137)  . dextrose 5 % and 0.45% NaCl 50 mL/hr at 09/17/19 0112  . fluconazole (DIFLUCAN) IV    . methocarbamol (ROBAXIN) IV Stopped (09/15/19 1922)  . piperacillin-tazobactam (ZOSYN)  IV 3.375 g (09/17/19 0543)     Assessment/Plan H. pylori positive 09/12/2019 Hypertension Acute renal insufficiency Tobacco use Large pleural effusion (right base with atelectasis minimal atelectasis)2/1+2/21 Right adrenal lesion  Perforated pyloric ulcer, adjacent firmness concerning for mass versus inflammation, widespread red bilious succus throughout the abdomen, diffuse purulent peritonitis Exploratory laparotomy, gastric biopsy, omental pedicle flap(Graham patch) repair of perforated pyloric ulcer  09/05/2019, Dr. Jens Som Perihepatic/pelvic fluid collections IR drain placement x2; perihepatic collection/pelvic collection -09/14/2019 (cultures GM + rods, GM - cocci, few yeast(09/16/19)  FEN: IV fluids/full liquids ID: Zosyn 1/21 >> 13 days; Flagyl/tetracycline 1/29 >> day 5 DVT: Lovenox Follow-up: Dr. Windle Guard  Plan: Continue current therapy.  He will need to be started on H. pylori antibiotics once his other infections have cleared.  Await CT results.  Continue on full liquids for now.    LOS: 12 days    Albert Love 09/17/2019 Please see Amion

## 2019-09-17 NOTE — Progress Notes (Signed)
Pt is c/o having "breathing attacks" where he throat closes up and he can not breath. When asked he said they last about 10-15 seconds. Patient states this has happened once since shift change. RN did not witness this event. Currently, patient is not in distress. O2 is 94% on RA. Lung sounds are diminished with rhonci in lower lobes. RN had patient demonstrate IS and flutter valve use. Patient is on continuous pulse ox for monitoring. Pt states he would like to record his stats and time for if and when this happens again. Pt has CXR in am.

## 2019-09-17 NOTE — Progress Notes (Signed)
Referring Physician(s): Gross,S  Supervising Physician: Sandi Mariscal  Patient Status:  [facility] - In-pt  Chief Complaint:  Abdominal pain/fluid collections  Subjective: Pt doing ok this pm; has some abd discomfort but not worsening; denies N/V   Allergies: Patient has no known allergies.  Medications: Prior to Admission medications   Medication Sig Start Date End Date Taking? Authorizing Provider  dimenhyDRINATE (DRAMAMINE) 50 MG tablet Take 50 mg by mouth every 8 (eight) hours as needed.   Yes [provider]     Vital Signs: BP (!) 148/81 (BP Location: Right Arm)   Pulse 94   Temp 98.3 F (36.8 C)   Resp 16   Ht 5\' 11"  (1.803 m)   Wt 175 lb (79.4 kg)   SpO2 96%   BMI 24.41 kg/m   Physical Exam  Awake/alert; rt perihepatic drain /rt TG/pelvic drain intact, sites ok, mildly tender, OP about 40 cc each yesterday, about 5- 10 cc each today serous fluid ; both drains flushed with minimal return  Imaging: CT ABDOMEN PELVIS W CONTRAST  Result Date: 09/17/2019 CLINICAL DATA:  Follow-up fluid collections. EXAM: CT ABDOMEN AND PELVIS WITH CONTRAST TECHNIQUE: Multidetector CT imaging of the abdomen and pelvis was performed using the standard protocol following bolus administration of intravenous contrast. CONTRAST:  113mL OMNIPAQUE IOHEXOL 300 MG/ML  SOLN COMPARISON:  September 13, 2019. FINDINGS: Lower chest: Small right pleural effusion is noted with adjacent subsegmental atelectasis. Hepatobiliary: No gallstones or biliary dilatation is noted. Stable left hepatic cyst. Pancreas: Unremarkable. No pancreatic ductal dilatation or surrounding inflammatory changes. Spleen: Normal in size without focal abnormality. Adrenals/Urinary Tract: Stable right adrenal lesion as described on prior exam. Left adrenal gland appears normal. No hydronephrosis or renal obstruction is noted. No renal or ureteral calculi are noted. Urinary bladder is unremarkable. Stomach/Bowel: Mild gastric wall  thickening is noted suggesting inflammation or possibly edema. There is no evidence of bowel obstruction. There remains wall thickening involving the cecum inferiorly which may be inflammatory in etiology, but neoplasm cannot be excluded. The appendix continues to demonstrate some enhancement but is stable in size and appearance compared to prior exam. Vascular/Lymphatic: Aortic atherosclerosis. No enlarged abdominal or pelvic lymph nodes. 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 Other: Interval placement of percutaneous drainage catheters in fluid collections noted on prior exam inferior to right hepatic lobe as well as in pre rectal space of the pelvis. These fluid collections appear to be significantly smaller compared to prior exam, with abnormality inferior to right hepatic lobe measuring 4.2 x 2.6 cm and abnormality in pre rectal space measuring 3.4 x 2.6 cm. 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 No acute abnormality is noted. IMPRESSION: Interval placement of percutaneous drainage catheters into fluid collections noted on prior exam inferior to the right hepatic lobe and in the pre rectal space of the pelvis. These fluid collections are significantly smaller compared to prior exam, although a small amount of residual fluid does remain in each. Mild gastric wall thickening is noted suggesting inflammation or possibly edema. There also remains wall thickening involving the inferior portion of the cecum which may be inflammatory in etiology, but neoplasm cannot be excluded. Colonoscopy is recommended for further evaluation at the appropriate time. Stable appearance and enhancement of the appendix is noted which potentially may be inflammatory in etiology, but continued attention on follow-up is recommended. Stable right adrenal lesion as described above. Severely enlarged prostate  gland which is stable.  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 Aortic Atherosclerosis (ICD10-I70.0). Electronically Signed   By: Marijo Conception M.D.   On: 09/17/2019 12:55   DG CHEST PORT 1 VIEW  Result Date: 09/17/2019 CLINICAL DATA:  Follow-up pleural effusion. EXAM: PORTABLE CHEST 1 VIEW COMPARISON:  09/16/2019 FINDINGS: Persistent small right pleural effusion with overlying atelectasis. Minimal streaky left basilar atelectasis is stable. No edema or infiltrates. The cardiac silhouette, mediastinal and hilar contours are normal and stable. IMPRESSION: Persistent small right pleural effusion and overlying atelectasis. Electronically Signed   By: Marijo Sanes M.D.   On: 09/17/2019 05:19   DG CHEST PORT 1 VIEW  Result Date: 09/16/2019 CLINICAL DATA:  Worsening shortness of breath today. EXAM: PORTABLE CHEST 1 VIEW COMPARISON:  09/05/2019 FINDINGS: Heart size remains normal. Mediastinal shadows are normal. Minimal atelectasis at the left lung base. Enlarged effusion on the right with pendant atelectasis of the right lower lung. Upper lungs remain clear. IMPRESSION: Enlarging effusion on the right with right base atelectasis. Minimal atelectasis at the left lung base. Electronically Signed   By: Nelson Chimes M.D.   On: 09/16/2019 10:54   CT IMAGE GUIDED DRAINAGE BY PERCUTANEOUS CATHETER  Result Date: 09/14/2019 INDICATION: History of gastric ulcer perforation, postoperative repair, now with abdominal and pelvic fluid collections. Please perform CT-guided percutaneous drainage catheter placement for infection source control purposes. EXAM: CT IMAGE GUIDED DRAINAGE BY PERCUTANEOUS CATHETER x2 COMPARISON:  CT abdomen and pelvis - 09/13/2019 MEDICATIONS: The patient is currently admitted to the hospital and receiving intravenous antibiotics. The antibiotics were administered within an appropriate time frame prior to the initiation of the procedure. ANESTHESIA/SEDATION: Moderate  (conscious) sedation was employed during this procedure. A total of Versed 3 mg and Fentanyl 150 mcg was administered intravenously. Moderate Sedation Time: 20 minutes. The patient's level of consciousness and vital signs were monitored continuously by radiology nursing throughout the procedure under my direct supervision. CONTRAST:  None COMPLICATIONS: None immediate. PROCEDURE: Informed written consent was obtained from the patient after a discussion of the risks, benefits and alternatives to treatment. The patient was initially placed supine on the CT gantry and a pre procedural CT was performed re-demonstrating the known abscess/fluid collection within the right upper abdominal quadrant, perihepatic space with dominant component measuring approximate 5.3 x 3.6 cm (image 57, series 2). The procedure was planned. A timeout was performed prior to the initiation of the procedure. The skin overlying the right upper abdominal quadrant was prepped and draped in the usual sterile fashion. The overlying soft tissues were anesthetized with 1% lidocaine with epinephrine. Appropriate trajectory was planned with the use of a 22 gauge spinal needle. An 18 gauge trocar needle was advanced into the abscess/fluid collection and a short Amplatz super stiff wire was coiled within the collection. Appropriate positioning was confirmed with a limited CT scan. The tract was serially dilated allowing placement of a 10 [place] all-purpose drainage catheter. Appropriate positioning was confirmed with a limited postprocedural CT scan. 10 cc of bloody, slightly complex fluid was aspirated. The tube was connected to a JP bulb and sutured in place. The patient was then positioned prone on the fluoroscopy table. Preprocedural imaging demonstrates unchanged size and appearance of the approximately 5.7 x 4.4 cm fluid collection within midline of the pelvis (image 35, series 2). The procedure was planned. The skin overlying the right buttocks was  prepped and draped in usual sterile fashion. The overlying soft  tissues were anesthetized with 1% lidocaine with epinephrine. Appropriate trajectory utilizing a right trans gluteal approach was planned with a 22 gauge spinal needle requiring a slight caudal to cranial trajectory. Next, an 38 gauge trocar needle was advanced into the abscess/fluid collection and a short Amplatz wire was coiled within the collection. Appropriate position was confirmed with a limited CT scan. The track was serially dilated, ultimately allowing placement of a 10 French percutaneous drainage catheter. Appropriate position was confirmed with limited postprocedural CT scan. Approximately 25 cc of purulent appearing fluid was aspirated. The tube was connected a JP bulb and sutured in place. A dressing was applied. Dressings were applied. The patient tolerated the above procedures well without immediate postprocedural complication. IMPRESSION: 1. Successful CT guided placement of a 10 [place] all purpose drain catheter into the perihepatic space fluid collection with aspiration of 10 mL of bloody, slightly complex fluid. 2. Successful CT-guided placement of a 10 French all-purpose drainage catheter into pelvic fluid collection via right trans gluteal approach with aspiration of 25 cc of purulent appearing fluid. 3. Samples were sent separately from each collection to the laboratory for analysis. Electronically Signed   By: Sandi Mariscal M.D.   On: 09/14/2019 12:05   CT IMAGE GUIDED DRAINAGE BY PERCUTANEOUS CATHETER  Result Date: 09/14/2019 INDICATION: History of gastric ulcer perforation, postoperative repair, now with abdominal and pelvic fluid collections. Please perform CT-guided percutaneous drainage catheter placement for infection source control purposes. EXAM: CT IMAGE GUIDED DRAINAGE BY PERCUTANEOUS CATHETER x2 COMPARISON:  CT abdomen and pelvis - 09/13/2019 MEDICATIONS: The patient is currently admitted to the hospital and  receiving intravenous antibiotics. The antibiotics were administered within an appropriate time frame prior to the initiation of the procedure. ANESTHESIA/SEDATION: Moderate (conscious) sedation was employed during this procedure. A total of Versed 3 mg and Fentanyl 150 mcg was administered intravenously. Moderate Sedation Time: 20 minutes. The patient's level of consciousness and vital signs were monitored continuously by radiology nursing throughout the procedure under my direct supervision. CONTRAST:  None COMPLICATIONS: None immediate. PROCEDURE: Informed written consent was obtained from the patient after a discussion of the risks, benefits and alternatives to treatment. The patient was initially placed supine on the CT gantry and a pre procedural CT was performed re-demonstrating the known abscess/fluid collection within the right upper abdominal quadrant, perihepatic space with dominant component measuring approximate 5.3 x 3.6 cm (image 57, series 2). The procedure was planned. A timeout was performed prior to the initiation of the procedure. The skin overlying the right upper abdominal quadrant was prepped and draped in the usual sterile fashion. The overlying soft tissues were anesthetized with 1% lidocaine with epinephrine. Appropriate trajectory was planned with the use of a 22 gauge spinal needle. An 18 gauge trocar needle was advanced into the abscess/fluid collection and a short Amplatz super stiff wire was coiled within the collection. Appropriate positioning was confirmed with a limited CT scan. The tract was serially dilated allowing placement of a 10 [place] all-purpose drainage catheter. Appropriate positioning was confirmed with a limited postprocedural CT scan. 10 cc of bloody, slightly complex fluid was aspirated. The tube was connected to a JP bulb and sutured in place. The patient was then positioned prone on the fluoroscopy table. Preprocedural imaging demonstrates unchanged size and  appearance of the approximately 5.7 x 4.4 cm fluid collection within midline of the pelvis (image 35, series 2). The procedure was planned. The skin overlying the right buttocks was prepped and draped in usual sterile  fashion. The overlying soft tissues were anesthetized with 1% lidocaine with epinephrine. Appropriate trajectory utilizing a right trans gluteal approach was planned with a 22 gauge spinal needle requiring a slight caudal to cranial trajectory. Next, an 34 gauge trocar needle was advanced into the abscess/fluid collection and a short Amplatz wire was coiled within the collection. Appropriate position was confirmed with a limited CT scan. The track was serially dilated, ultimately allowing placement of a 10 French percutaneous drainage catheter. Appropriate position was confirmed with limited postprocedural CT scan. Approximately 25 cc of purulent appearing fluid was aspirated. The tube was connected a JP bulb and sutured in place. A dressing was applied. Dressings were applied. The patient tolerated the above procedures well without immediate postprocedural complication. IMPRESSION: 1. Successful CT guided placement of a 10 [place] all purpose drain catheter into the perihepatic space fluid collection with aspiration of 10 mL of bloody, slightly complex fluid. 2. Successful CT-guided placement of a 10 French all-purpose drainage catheter into pelvic fluid collection via right trans gluteal approach with aspiration of 25 cc of purulent appearing fluid. 3. Samples were sent separately from each collection to the laboratory for analysis. Electronically Signed   By: Sandi Mariscal M.D.   On: 09/14/2019 12:05    Labs:  CBC: Recent Labs    09/13/19 0413 09/14/19 0339 09/16/19 0346 09/17/19 0428  WBC 14.3* 14.5* 14.8* 13.8*  HGB 11.4* 12.4* 12.4* 11.9*  HCT 35.7* 40.2 39.2 36.8*  PLT 154 206 147* 266    COAGS: Recent Labs    09/05/19 1058 09/05/19 1058 09/05/19 1531 09/06/19 0002  09/07/19 0431 09/14/19 0339  INR 4.8*   < > 1.6* 1.7* 1.5* 1.3*  APTT 78*  --   --   --   --   --    < > = values in this interval not displayed.    BMP: Recent Labs    09/13/19 0413 09/14/19 0339 09/16/19 0346 09/17/19 0428  NA 136 137 135 134*  K 3.1* 3.8 3.8 4.0  CL 101 99 98 98  CO2 28 28 27 26   GLUCOSE 121* 112* 120* 110*  BUN <5* <5* 10 11  CALCIUM 7.6* 7.9* 8.0* 7.7*  CREATININE 0.72 0.87 1.08 0.95  GFRNONAA >60 >60 >60 >60  GFRAA >60 >60 >60 >60    LIVER FUNCTION TESTS: Recent Labs    09/05/19 0828 09/06/19 0002  BILITOT 1.1 0.9  AST 29 22  ALT 17 16  ALKPHOS 85 48  PROT 7.4 5.1*  ALBUMIN 3.8 2.7*    Assessment and Plan: Pt with hx gastric ulcer perf with operative repair 09/05/19 and  subsequent perihepatic/pelvic fluid collections, s/p drainage 1/30; afebrile; WBC 13.8(14.8), hgb stable; creat nl; drain fluid cx pend; f/u CT today revealed significant decrease in size of drain fluid collections although small residual fluid does remain in each.  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  Imaging studies were reviewed by Dr. Pascal Lux.  At this time as long as drain outputs remain minimal  will plan for drain injections on Thursday or Friday of this week prior to consideration of removal.   Electronically Signed: D. Rowe Robert, PA-C 09/17/2019, 2:30 PM  I spent a total of 15 minutes at the the patient's bedside AND on the patient's hospital floor or unit, greater than 50% of which was counseling/coordinating care for abdominal/pelvic fluid collection drains    Patient ID: Albert Love, male    DOB: 05/10/1941, 79 y.o.   MRN: [id number]

## 2019-09-17 NOTE — Plan of Care (Signed)
  Problem: Clinical Measurements: Goal: Ability to maintain clinical measurements within normal limits will improve Outcome: Progressing Goal: Will remain free from infection Outcome: Progressing Goal: Diagnostic test results will improve Outcome: Progressing   Problem: Pain Managment: Goal: General experience of comfort will improve Outcome: Progressing   Problem: Skin Integrity: Goal: Risk for impaired skin integrity will decrease Outcome: Progressing

## 2019-09-17 NOTE — Progress Notes (Signed)
Physical Therapy Treatment Patient Details Name: Dieon Jamal MRN: [id number] DOB: [birth year] Today's Date: 09/17/2019    History of Present Illness 79 yo male admitted with abdominal pain, perforated ulcer.  s/p exploratory laparotomy, gastric biopsy, omental pedicle flap and  repair of perforated pyloric ulcer    PT Comments    Assisted with amb then to bathroom.  General Gait Details: Not his best distance but improved from last session.  Follow Up Recommendations  No PT follow up     Equipment Recommendations       Recommendations for Other Services       Precautions / Restrictions Precautions Precautions: Fall Precaution Comments: abd sx; jp drain (R) Restrictions Weight Bearing Restrictions: No    Mobility  Bed Mobility               General bed mobility comments: OOB in recliner  Transfers Overall transfer level: Needs assistance Equipment used: 1 person hand held assist Transfers: Sit to/from Stand Sit to Stand: From elevated surface;Min guard;Supervision Stand pivot transfers: Min guard       General transfer comment: Assist to rise, steady.  Ambulation/Gait Ambulation/Gait assistance: Supervision;Min guard Gait Distance (Feet): 85 Feet Assistive device: IV Pole Gait Pattern/deviations: Step-to pattern;Trunk flexed Gait velocity: decreased   General Gait Details: Not his best distance but improved from last session.   Stairs             Wheelchair Mobility    Modified Rankin (Stroke Patients Only)       Balance                                            Cognition Arousal/Alertness: Awake/alert Behavior During Therapy: WFL for tasks assessed/performed Overall Cognitive Status: Within Functional Limits for tasks assessed                                 General Comments: very willing and motivated      Exercises      General Comments        Pertinent Vitals/Pain Pain Assessment:  Faces Faces Pain Scale: Hurts little more Pain Location: abdomen Pain Descriptors / Indicators: Spasm;Discomfort;Sore Pain Intervention(s): Monitored during session    Home Living                      Prior Function            PT Goals (current goals can now be found in the care plan section) Progress towards PT goals: Progressing toward goals    Frequency    Min 3X/week      PT Plan Current plan remains appropriate    Co-evaluation              AM-PAC PT "6 Clicks" Mobility   Outcome Measure  Help needed turning from your back to your side while in a flat bed without using bedrails?: A Little Help needed moving from lying on your back to sitting on the side of a flat bed without using bedrails?: A Little Help needed moving to and from a bed to a chair (including a wheelchair)?: A Little Help needed standing up from a chair using your arms (e.g., wheelchair or bedside chair)?: A Little Help needed to walk in hospital room?: A Little Help needed climbing  3-5 steps with a railing? : A Little 6 Click Score: 18    End of Session Equipment Utilized During Treatment: Gait belt Activity Tolerance: Patient limited by pain Patient left: in chair;with call bell/phone within reach Nurse Communication: Mobility status PT Visit Diagnosis: Difficulty in walking, not elsewhere classified (R26.2);Pain     Time: 0945-1000 PT Time Calculation (min) (ACUTE ONLY): 15 min  Charges:  $Gait Training: 8-22 mins                     Rica Koyanagi  PTA Acute  Rehabilitation Services Pager      [phone] Office      [phone]

## 2019-09-17 NOTE — Plan of Care (Signed)
Addendum: Instructor Cosign  Faculty/Clinical instructor signing in agreement with [place] report as follows:   Patient's WBC level has decreased from 14.8 (2/1) to 13.8 (2/2). Student nurse helped nurse change midline incisional wet to dry dressing. Wound had pink edges and granulation in the wound bed. A wet 4x4 gauze was placed in the wound, dry 4x4 gauze, ABD and papertape.  Student nurse helped nurse flush and drain JP drains. Drains were flushed with 63mL of injectable normal saline.  55mL of serous fluid was drained from back right lower drain. 4mL of serous fluid was drained from RUQ drain.  Student nurse helped patient walk length of hallway.  Patient reported pain at 1345 of 6 out of 10. Nurse gave patient Oxycodone 5mg  PO. Student nurse checked patient's pain level again at 1500. Patient reported pain 1 out of 10.  Nurse helped patient to bathroom. Patient had loose bowel movement. Patient did not verbalize pain ambulating from chair to bathroom. Student nurse helped bring patient down to CT for CT with contrast of abdomen and pelvis. Radiology report came back reporting "lower chest small right pleural effusion with adjacent subsegmental atelectasis.

## 2019-09-17 NOTE — Plan of Care (Cosign Needed)
Patient's WBC level has decreased from 14.8 (2/1) to 13.8 (2/2). Student nurse helped nurse change midline incisional wet to dry dressing. Wound had pink edges and granulation in the wound bed. A wet 4x4 gauze was placed in the wound, dry 4x4 gauze, ABD and papertape.  Student nurse helped nurse flush and drain JP drains. Drains were flushed with 35mL of injectable normal saline.  19mL of serous fluid was drained from back right lower drain. 37mL of serous fluid was drained from RUQ drain.  Student nurse helped patient walk length of hallway.  Patient reported pain at 1345 of 6 out of 10. Nurse gave patient Oxycodone 5mg  PO. Student nurse checked patient's pain level again at 1500. Patient reported pain 1 out of 10.  Nurse helped patient to bathroom. Patient had loose bowel movement. Patient did not verbalize pain ambulating from chair to bathroom. Student nurse helped bring patient down to CT for CT with contrast of abdomen and pelvis. Radiology report came back reporting "lower chest small right pleural effusion with adjacent subsegmental atelectasis.

## 2019-09-18 MED ORDER — FLUCONAZOLE IN SODIUM CHLORIDE 200-0.9 MG/100ML-% IV SOLN
200.0000 mg | INTRAVENOUS | Status: DC
  Filled 2019-09-18: qty 100

## 2019-09-18 MED ORDER — METOCLOPRAMIDE HCL 5 MG PO TABS
5.0000 mg | ORAL_TABLET | Freq: Three times a day (TID) | ORAL | Status: DC
  Administered 2019-09-18 – 2019-09-19 (×3): 5 mg via ORAL
  Filled 2019-09-18 (×3): qty 1

## 2019-09-18 NOTE — Progress Notes (Signed)
Physical Therapy Treatment Patient Details Name: Albert Love MRN: [id number] DOB: 01-25-1941 Today's Date: 09/18/2019    History of Present Illness 79 yo male admitted with abdominal pain, perforated ulcer.  s/p exploratory laparotomy, gastric biopsy, omental pedicle flap and  repair of perforated pyloric ulcer    PT Comments    Patient eager to work with therapy and highly motivated. Pt sitting on window bench at start of session and educated on concern for patient to be up and moving around in hospital room on his own. Pt continues to require min guard/supervision for transfers and gait with IV pole for steadying. Pt able to increase distance today with no complaints of fatigue and performed stair mobility with bil hand rails and no difficulty. He was instructed on functional exercises for LE strengthening. Pt is progressing well, PT will continue to progress as able throughout acute stay.   Follow Up Recommendations  No PT follow up     Equipment Recommendations  (continue to assess)    Recommendations for Other Services       Precautions / Restrictions Precautions Precautions: Fall Precaution Comments: abd sx; jp drain (R) Restrictions Weight Bearing Restrictions: No    Mobility  Bed Mobility               General bed mobility comments: OOB in recliner  Transfers Overall transfer level: Needs assistance Equipment used: None Transfers: Sit to/from Stand Sit to Stand: Min guard;Supervision;From elevated surface         General transfer comment: min guard/supervision from bench by window, pt sitting at windowsill bench with nursing students present.  Ambulation/Gait Ambulation/Gait assistance: Supervision;Min guard Gait Distance (Feet): 140 Feet Assistive device: IV Pole Gait Pattern/deviations: Step-to pattern;Trunk flexed Gait velocity: decreased   General Gait Details: pt with improved distance and improved activity tolerance with no c/o faitgue. pt  setady wtih IV pole and able to negotiate obstacles in hallway (ie. chairs, doorway, turns).   Stairs Stairs: Yes Stairs assistance: Min guard Stair Management: Two rails;Forwards;Alternating pattern Number of Stairs: 2 General stair comments: pt with good sequencing for stair mobility, no assist required, pt using bil UE support which he has at home.   Wheelchair Mobility    Modified Rankin (Stroke Patients Only)       Balance Overall balance assessment: Needs assistance   Sitting balance-Leahy Scale: Good     Standing balance support: Single extremity supported;During functional activity Standing balance-Leahy Scale: Fair             Cognition Arousal/Alertness: Awake/alert Behavior During Therapy: WFL for tasks assessed/performed Overall Cognitive Status: Within Functional Limits for tasks assessed        General Comments: very willing and motivated      Exercises Other Exercises Other Exercises: Repeated Sit<>Stands: 2x5 reps with UE use for power up    General Comments        Pertinent Vitals/Pain Pain Assessment: Faces Faces Pain Scale: Hurts a little bit Pain Location: abdomen Pain Descriptors / Indicators: Discomfort Pain Intervention(s): Limited activity within patient's tolerance;Monitored during session;Repositioned           PT Goals (current goals can now be found in the care plan section) Acute Rehab PT Goals Patient Stated Goal: home PT Goal Formulation: With patient Time For Goal Achievement: 09/24/19 Potential to Achieve Goals: Good Progress towards PT goals: Progressing toward goals(goals remain appropriate, will continue to progress towards them, date extended by 1 week.)    Frequency    Min  3X/week      PT Plan Current plan remains appropriate       AM-PAC PT "6 Clicks" Mobility   Outcome Measure  Help needed turning from your back to your side while in a flat bed without using bedrails?: A Little Help needed  moving from lying on your back to sitting on the side of a flat bed without using bedrails?: A Little Help needed moving to and from a bed to a chair (including a wheelchair)?: A Little Help needed standing up from a chair using your arms (e.g., wheelchair or bedside chair)?: A Little Help needed to walk in hospital room?: A Little Help needed climbing 3-5 steps with a railing? : A Little 6 Click Score: 18    End of Session Equipment Utilized During Treatment: Gait belt Activity Tolerance: Patient tolerated treatment well Patient left: in chair;with call bell/phone within reach Nurse Communication: Mobility status PT Visit Diagnosis: Difficulty in walking, not elsewhere classified (R26.2);Pain     Time: [id number] PT Time Calculation (min) (ACUTE ONLY): 23 min  Charges:  $Gait Training: 8-22 mins $Therapeutic Exercise: 8-22 mins                     Verner Mould, DPT Physical Therapist with [facility] [phone]  09/18/2019 12:59 PM

## 2019-09-18 NOTE — Progress Notes (Signed)
Referring Physician(s): Gross,S  Supervising Physician: Daryll Brod  Patient Status:  [facility] - In-pt  Chief Complaint: Abdominal pain/fluid collections   Subjective: Pt doing ok; denies worsening abd pain,N/V  Allergies: Patient has no known allergies.  Medications: Prior to Admission medications   Medication Sig Start Date End Date Taking? Authorizing Provider  dimenhyDRINATE (DRAMAMINE) 50 MG tablet Take 50 mg by mouth every 8 (eight) hours as needed.   Yes [provider]     Vital Signs: BP (!) 161/87   Pulse (!) 105   Temp 98.8 F (37.1 C) (Oral)   Resp 18   Ht 5\' 11"  (1.803 m)   Wt 175 lb (79.4 kg)   SpO2 96%   BMI 24.41 kg/m   Physical Exam Awake/alert; rt perihepatic drain /rt TG/pelvic drain intact, sites ok, mildly tender, OP's minimal amt serous fluid  Imaging: CT ABDOMEN PELVIS W CONTRAST  Result Date: 09/17/2019 CLINICAL DATA:  Follow-up fluid collections. EXAM: CT ABDOMEN AND PELVIS WITH CONTRAST TECHNIQUE: Multidetector CT imaging of the abdomen and pelvis was performed using the standard protocol following bolus administration of intravenous contrast. CONTRAST:  148mL OMNIPAQUE IOHEXOL 300 MG/ML  SOLN COMPARISON:  September 13, 2019. FINDINGS: Lower chest: Small right pleural effusion is noted with adjacent subsegmental atelectasis. Hepatobiliary: No gallstones or biliary dilatation is noted. Stable left hepatic cyst. Pancreas: Unremarkable. No pancreatic ductal dilatation or surrounding inflammatory changes. Spleen: Normal in size without focal abnormality. Adrenals/Urinary Tract: Stable right adrenal lesion as described on prior exam. Left adrenal gland appears normal. No hydronephrosis or renal obstruction is noted. No renal or ureteral calculi are noted. Urinary bladder is unremarkable. Stomach/Bowel: Mild gastric wall thickening is noted suggesting inflammation or possibly edema. There is no evidence of bowel obstruction. There remains wall  thickening involving the cecum inferiorly which may be inflammatory in etiology, but neoplasm cannot be excluded. The appendix continues to demonstrate some enhancement but is stable in size and appearance compared to prior exam. Vascular/Lymphatic: Aortic atherosclerosis. No enlarged abdominal or pelvic lymph nodes. 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 Other: Interval placement of percutaneous drainage catheters in fluid collections noted on prior exam inferior to right hepatic lobe as well as in pre rectal space of the pelvis. These fluid collections appear to be significantly smaller compared to prior exam, with abnormality inferior to right hepatic lobe measuring 4.2 x 2.6 cm and abnormality in pre rectal space measuring 3.4 x 2.6 cm. 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 No acute abnormality is noted. IMPRESSION: Interval placement of percutaneous drainage catheters into fluid collections noted on prior exam inferior to the right hepatic lobe and in the pre rectal space of the pelvis. These fluid collections are significantly smaller compared to prior exam, although a small amount of residual fluid does remain in each. Mild gastric wall thickening is noted suggesting inflammation or possibly edema. There also remains wall thickening involving the inferior portion of the cecum which may be inflammatory in etiology, but neoplasm cannot be excluded. Colonoscopy is recommended for further evaluation at the appropriate time. Stable appearance and enhancement of the appendix is noted which potentially may be inflammatory in etiology, but continued attention on follow-up is recommended. Stable right adrenal lesion as described above. Severely enlarged prostate gland which is stable. 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 Aortic Atherosclerosis (ICD10-I70.0).  Electronically Signed   By: Jeneen Rinks  Murlean Caller M.D.   On: 09/17/2019 12:55   DG CHEST PORT 1 VIEW  Result Date: 09/17/2019 CLINICAL DATA:  Follow-up pleural effusion. EXAM: PORTABLE CHEST 1 VIEW COMPARISON:  09/16/2019 FINDINGS: Persistent small right pleural effusion with overlying atelectasis. Minimal streaky left basilar atelectasis is stable. No edema or infiltrates. The cardiac silhouette, mediastinal and hilar contours are normal and stable. IMPRESSION: Persistent small right pleural effusion and overlying atelectasis. Electronically Signed   By: Marijo Sanes M.D.   On: 09/17/2019 05:19   DG CHEST PORT 1 VIEW  Result Date: 09/16/2019 CLINICAL DATA:  Worsening shortness of breath today. EXAM: PORTABLE CHEST 1 VIEW COMPARISON:  09/05/2019 FINDINGS: Heart size remains normal. Mediastinal shadows are normal. Minimal atelectasis at the left lung base. Enlarged effusion on the right with pendant atelectasis of the right lower lung. Upper lungs remain clear. IMPRESSION: Enlarging effusion on the right with right base atelectasis. Minimal atelectasis at the left lung base. Electronically Signed   By: Nelson Chimes M.D.   On: 09/16/2019 10:54    Labs:  CBC: Recent Labs    09/13/19 0413 09/14/19 0339 09/16/19 0346 09/17/19 0428  WBC 14.3* 14.5* 14.8* 13.8*  HGB 11.4* 12.4* 12.4* 11.9*  HCT 35.7* 40.2 39.2 36.8*  PLT 154 206 147* 266    COAGS: Recent Labs    09/05/19 1058 09/05/19 1058 09/05/19 1531 09/06/19 0002 09/07/19 0431 09/14/19 0339  INR 4.8*   < > 1.6* 1.7* 1.5* 1.3*  APTT 78*  --   --   --   --   --    < > = values in this interval not displayed.    BMP: Recent Labs    09/13/19 0413 09/14/19 0339 09/16/19 0346 09/17/19 0428  NA 136 137 135 134*  K 3.1* 3.8 3.8 4.0  CL 101 99 98 98  CO2 28 28 27 26   GLUCOSE 121* 112* 120* 110*  BUN <5* <5* 10 11  CALCIUM 7.6* 7.9* 8.0* 7.7*  CREATININE 0.72 0.87 1.08 0.95  GFRNONAA >60 >60 >60 >60  GFRAA >60 >60 >60 >60     LIVER FUNCTION TESTS: Recent Labs    09/05/19 0828 09/06/19 0002  BILITOT 1.1 0.9  AST 29 22  ALT 17 16  ALKPHOS 85 48  PROT 7.4 5.1*  ALBUMIN 3.8 2.7*    A/P:  Pt with hx gastric ulcer perf with operative repair 09/05/19 and  subsequent perihepatic/pelvic fluid collections, s/p drainage 1/30; afebrile; no new labs today;  drain fluid cx pend; f/u CT yesterday revealed significant decrease in size of drain fluid collections although small residual fluid does remain in each.  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  Imaging studies were reviewed by Dr. Pascal Lux. Will plan drain injections on 2/4.    Electronically Signed: D. Rowe Robert, PA-C 09/18/2019, 11:56 AM   I spent a total of 15 minutes at the the patient's bedside AND on the patient's hospital floor or unit, greater than 50% of which was counseling/coordinating care for abdominal/pelvic fluid collection drains    Patient ID: Albert Love, male   DOB: [birth year], 79 y.o.   MRN: [id number]

## 2019-09-18 NOTE — Progress Notes (Addendum)
13 Days Post-Op    CC: Abdominal pain and nausea  Subjective: He continues to feel better.  CT scan yesterday showed the drain sites have improved.  He is tolerating p.o.'s well.  He sat up in the chair most of yesterday.  He is recently moved down here to be with his son from [place].  He plans to stay in this area.  Objective: Vital signs in last 24 hours: Temp:  [98.3 F (36.8 C)-99.2 F (37.3 C)] 98.5 F (36.9 C) (02/03 0518) Pulse Rate:  [94-100] 100 (02/03 0518) Resp:  [16-19] 17 (02/03 0518) BP: (141-164)/(79-87) 161/87 (02/03 0828) SpO2:  [96 %-100 %] 96 % (02/03 0518) Last BM Date: 09/17/19 290 p.o. recorded  1600 IV 1640 urine Drain right upper quadrant 5 cc Posterior drain 10 cc Stool x1 Afebrile, blood pressures slightly elevated. No labs today CT scan 09/17/2019: This showed reduction in the right hepatic and the perirectal space where the drains have been placed.  Mild gastric wall thickening is noted suggesting inflammation or possible edema there is also some wall thickening involving the inferior portion of the cecum possible inflammatory but a neoplasm was not ruled out.   Intake/Output from previous day: 02/02 0701 - 02/03 0700 In: 1910.9 [P.O.:290; I.V.:1386.3; IV Piggyback:224.6] Out: 1655 [Urine:1640; Drains:15] Intake/Output this shift: No intake/output data recorded.  General appearance: alert, cooperative and no distress Resp: clear to auscultation bilaterally Cardio: Regular rate and rhythm somewhat tachycardic. GI: Soft, midline incision looks good.  Both drains are clear serous fluid.  Tolerating diet and having bowel movements. Extremities: extremities normal, atraumatic, no cyanosis or edema  Lab Results:  Recent Labs    09/16/19 0346 09/17/19 0428  WBC 14.8* 13.8*  HGB 12.4* 11.9*  HCT 39.2 36.8*  PLT 147* 266    BMET Recent Labs    09/16/19 0346 09/17/19 0428  NA 135 134*  K 3.8 4.0  CL 98 98  CO2 27 26  GLUCOSE 120* 110*   BUN 10 11  CREATININE 1.08 0.95  CALCIUM 8.0* 7.7*   PT/INR No results for input(s): LABPROT, INR in the last 72 hours.  No results for input(s): AST, ALT, ALKPHOS, BILITOT, PROT, ALBUMIN in the last 168 hours.   Lipase     Component Value Date/Time   LIPASE 21 09/05/2019 0828     Medications: . amLODipine  5 mg Oral Daily  . bismuth subsalicylate  30 mL Oral TID AC & HS  . enoxaparin (LOVENOX) injection  40 mg Subcutaneous Q24H  . feeding supplement (ENSURE ENLIVE)  237 mL Oral QID  . folic acid  1 mg Oral Daily  . lip balm  1 application Topical BID  . mouth rinse  15 mL Mouth Rinse BID  . metoCLOPramide (REGLAN) injection  10 mg Intravenous Q6H  . metroNIDAZOLE  500 mg Oral Q8H  . multivitamin with minerals  1 tablet Oral Q lunch  . pantoprazole  40 mg Oral BID  . polyethylene glycol  17 g Oral Daily  . sodium chloride flush  5 mL Intracatheter Q8H  . tetracycline  500 mg Oral QID  . thiamine  100 mg Oral Daily   Or  . thiamine  100 mg Intravenous Daily    Assessment/Plan H. pylori positive 09/12/2019 - Pepto, flagyl, tetra 1/29 -->14 days  Hypertension Acute renal insufficiency Tobacco use Large pleural effusion (right base with atelectasis minimal atelectasis)2/1+2/21 Right adrenal lesion Thickening inferior portion of the cecum -inflammatory vs neoplasm -GI  follow-up for EGD/colonoscopy  Perforated pyloric ulcer, adjacent firmness concerning for mass versus inflammation, widespread red bilious succus throughout the abdomen, diffuse purulent peritonitis Exploratory laparotomy, gastric biopsy, omental pedicle flap(Graham patch) repair of perforated pyloric ulcer 09/05/2019, Dr. Jens Som Perihepatic/pelvic fluid collections IR drain placement x2; perihepatic collection/pelvic collection -09/14/2019 (cultures GM + rods, GM - cocci, few yeast(09/16/19) IR plans to inject drains 2/4 or 2/5  FEN: IV fluids/full liquids  >> soft ID: Zosyn 1/21 >> 13 days;  Flagyl/tetracycline 1/29 >> day 5; Diflucan 2/1 >> day 3 DVT: Lovenox Follow-up: Dr. Windle Guard   Plan: Advance him to a soft diet, continue to mobilize.  Start setting up home health resources.  He does not need PT.  If he goes home with the drains he may need home health nurse to assist and to help with open wound.  He will need GI follow-up and [facility] will begin arranging this.  Recheck labs in a.m. and stop Zosyn today, Diflucan in AM, saline lock IV fluids.    LOS: 13 days    Janisse Ghan 09/18/2019 Please see Amion

## 2019-09-18 NOTE — Progress Notes (Signed)
Initial Nutrition Assessment  INTERVENTION:   -Ensure Enlive po QID, each supplement provides 350 kcal and 20 grams of protein -Placed information on protein supplement options in discharge instructions  NUTRITION DIAGNOSIS:   Increased nutrient needs related to post-op healing as evidenced by estimated needs.  GOAL:   Patient will meet greater than or equal to 90% of their needs  MONITOR:   PO intake, Supplement acceptance, Labs, Weight trends, I & O's  REASON FOR ASSESSMENT:   Consult Assessment of nutrition requirement/status  ASSESSMENT:   79 year old male who presented to [facility] with abdominal pain since yesterday around 3-4 pm. Patient reports pain is generalized and is severe. He has a CT scan that reveals perforation of his duodenum with free air and fluid. Admitted for perforated ulcer.  1/21: s/p exploratory laparotomy, gastric biopsy, omental pedicle flap [name] patch) repair of perforated pyloric ulcer 1/30: CT guided placed of a 10 Fr drainage catheter placement  **RD working remotelyCountry[employer] was advanced to soft today. Pt was tolerating full liquids prior. Pt has not ordered lunch yet today. Will monitor PO intakes. Will continue Ensure supplements as pt is drinking these for additional kcals and protein.  Placed protein supplement options in discharge instructions.  Per weight records, no weight history available PTA.   I/Os: -7.2L since admit UOP: 650 ml x 24 hrs  Medications: Folic acid tablet, Reglan tablet, Multivitamin with minerals daily, Thiamine tablet  Labs reviewed: Low Na   NUTRITION - FOCUSED PHYSICAL EXAM:  Working remotely.  Diet Order:   Diet Order            DIET SOFT Room service appropriate? Yes; Fluid consistency: Thin  Diet effective now              EDUCATION NEEDS:   Education needs have been addressed  Skin:  Skin Assessment: Reviewed RN Assessment  Last BM:  2/3  Height:   Ht Readings from Last 1  Encounters:  09/05/19 5\' 11"  (1.803 m)    Weight:   Wt Readings from Last 1 Encounters:  09/05/19 79.4 kg    Ideal Body Weight:  78.1 kg  BMI:  Body mass index is 24.41 kg/m.  Estimated Nutritional Needs:   Kcal:  2000-2200  Protein:  90-100g  Fluid:  2L/day  Clayton Bibles, MS, RD, LDN Inpatient Clinical Dietitian Pager: [phone] After Hours Pager: [phone]

## 2019-09-19 ENCOUNTER — Inpatient Hospital Stay (HOSPITAL_COMMUNITY): Payer: Medicare PPO

## 2019-09-19 HISTORY — PX: IR SINUS/FIST TUBE CHK-NON GI: IMG673

## 2019-09-19 LAB — COMPREHENSIVE METABOLIC PANEL
ALT: 18 U/L (ref 0–44)
AST: 22 U/L (ref 15–41)
Albumin: 2.4 g/dL — ABNORMAL LOW (ref 3.5–5.0)
Alkaline Phosphatase: 64 U/L (ref 38–126)
Anion gap: 7 (ref 5–15)
BUN: 8 mg/dL (ref 8–23)
CO2: 27 mmol/L (ref 22–32)
Calcium: 8.2 mg/dL — ABNORMAL LOW (ref 8.9–10.3)
Chloride: 102 mmol/L (ref 98–111)
Creatinine, Ser: 0.69 mg/dL (ref 0.61–1.24)
GFR calc Af Amer: >60 mL/min (ref >60)
GFR calc non Af Amer: >60 mL/min (ref >60)
Glucose, Bld: 104 mg/dL — ABNORMAL HIGH (ref 70–99)
Potassium: 4 mmol/L (ref 3.5–5.1)
Sodium: 136 mmol/L (ref 135–145)
Total Bilirubin: 0.3 mg/dL (ref 0.3–1.2)
Total Protein: 6 g/dL — ABNORMAL LOW (ref 6.5–8.1)

## 2019-09-19 LAB — CBC
HCT: 37.3 % — ABNORMAL LOW (ref 39.0–52.0)
Hemoglobin: 11.8 g/dL — ABNORMAL LOW (ref 13.0–17.0)
MCH: 26.1 pg (ref 26.0–34.0)
MCHC: 31.6 g/dL (ref 30.0–36.0)
MCV: 82.5 fL (ref 80.0–100.0)
Platelets: 437 10*3/uL — ABNORMAL HIGH (ref 150–400)
RBC: 4.52 MIL/uL (ref 4.22–5.81)
RDW: 14.4 % (ref 11.5–15.5)
WBC: 9.1 10*3/uL (ref 4.0–10.5)
nRBC: 0 % (ref 0.0–0.2)

## 2019-09-19 IMAGING — XA IR FISTULA/SINUS TRACT
1 series · 3 of 3 positions shown · non-contrast
Comparison: None.

MEDICATIONS:
None

ANESTHESIA/SEDATION:
None

COMPLICATIONS:
None immediate.

INDICATION: 78-year-old male with a history of gastric ulcer perforation status
postoperative repair complicated by intra-abdominal fluid
collections. Patient underwent placement of CT-guided drain x2 on
[DATE]. Now, drain output is minimal and serous. Follow-up CT
imaging demonstrates near-total resolution of the previously noted
fluid collections. The perihepatic collection demonstrates some
residual fluid. Patient now presents for drain injection to assess
if the drainage catheters are obstructed and require exchange, or if
not obstructed removal.

EXAM:
SINUS TRACT INJECTION / FISTULOGRAM
TECHNIQUE: Informed written consent was obtained from the patient after a
thorough discussion of the procedural risks, benefits and
alternatives. All questions were addressed. Maximal Sterile Barrier
Technique was utilized including caps, mask, sterile gowns, sterile
gloves, sterile drape, hand hygiene and skin antiseptic. A timeout
was performed prior to the initiation of the procedure.

[Series 300: ir sinus/fist tube chk-non gi · 3 of 3 slices shown]
[im 1/3]
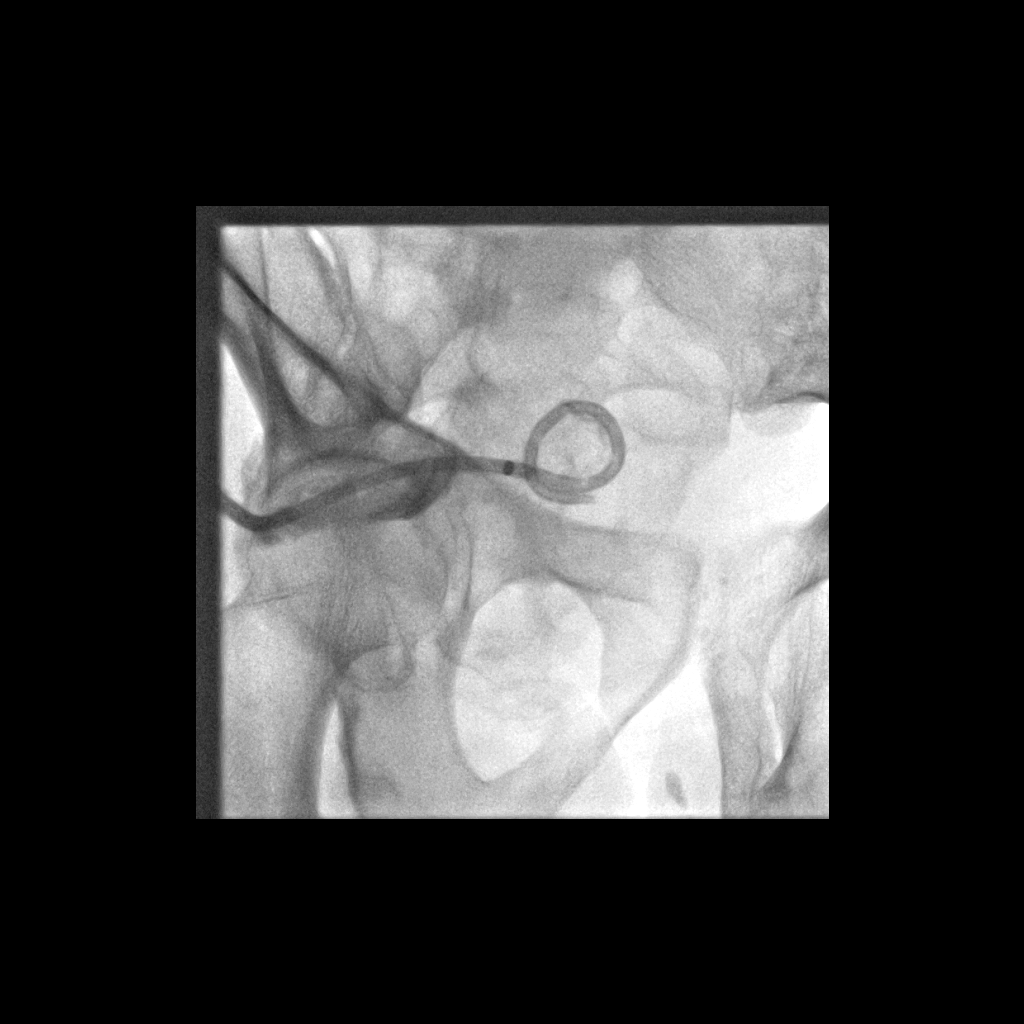
[im 2/3]
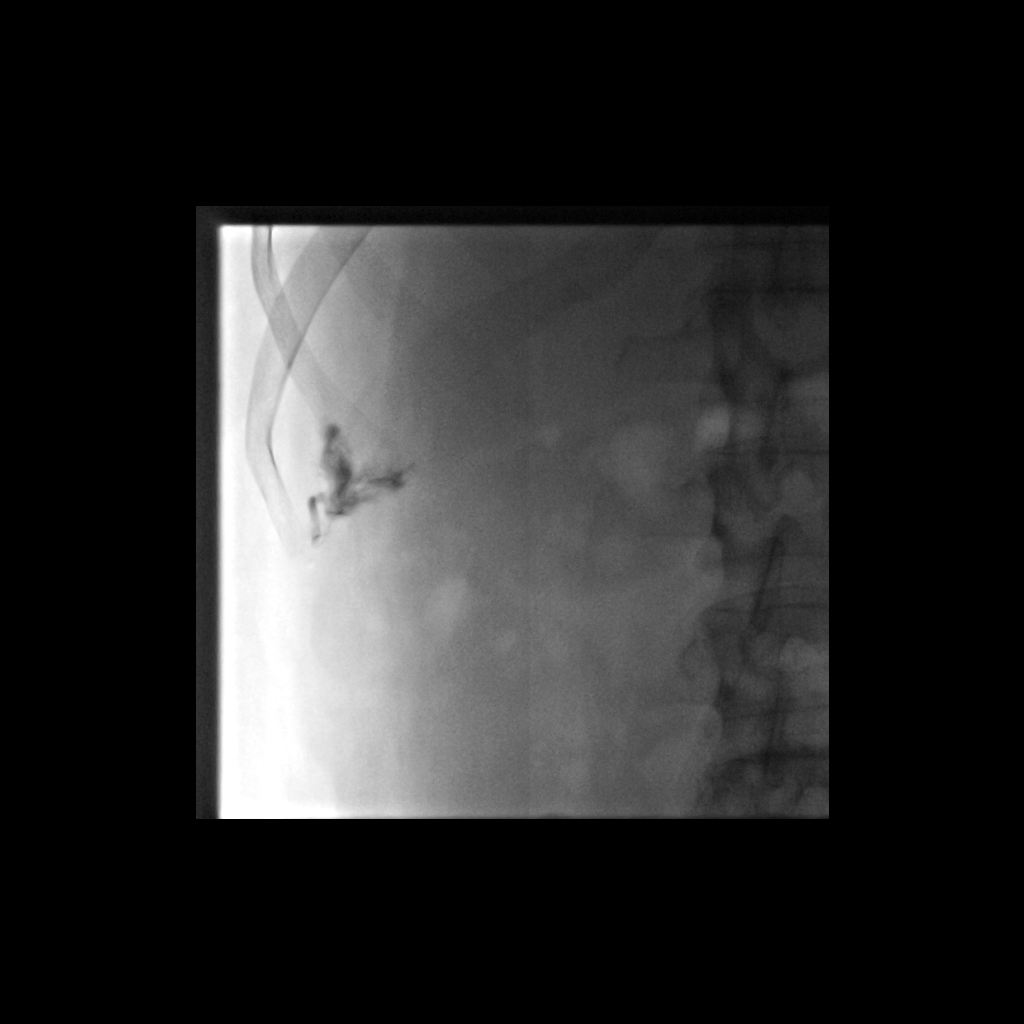
[im 3/3]
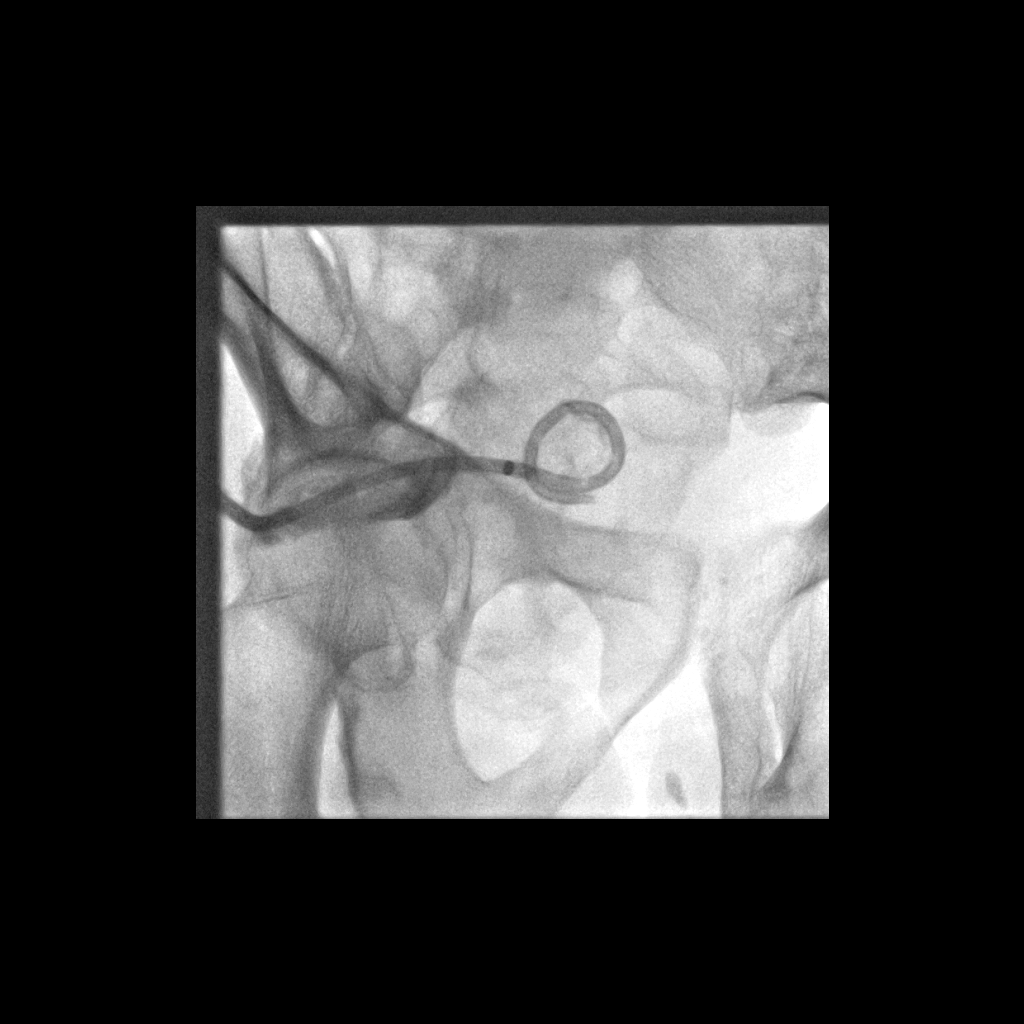

[3 of 3 positions shown; findings below may reference images not displayed]

PROCEDURE:
Attention was first turned to the right lower quadrant drain. The
drainage catheter was injected with intravenous contrast under
real-time fluoroscopy. The abscess cavity is collapsed. Injected
contrast material tracks back along the catheter tubing to the skin
surface. No evidence of fistula formation. The injected material was
aspirated. The catheter was cut and removed.

Attention was turned to the right upper quadrant catheter. Again,
contrast injection was performed under real-time fluoroscopy
demonstrating a collapsed perihepatic abscess cavity. No evidence of
fistula. The drainage catheter is not clogged and is functioning
appropriately. The injectate was aspirated and the catheter was cut
and removed.
IMPRESSION: 1. Collapsed abscess cavity in the low abdomen without evidence of
fistula formation. The right lower quadrant drainage catheter was
removed.
2. Collapsed abscess cavity in the right perihepatic space without
evidence of fistula formation. The right upper quadrant drainage
catheter was removed.

## 2019-09-19 MED ORDER — LIDOCAINE HCL 1 % IJ SOLN
INTRAMUSCULAR | Status: AC
  Filled 2019-09-19: qty 20

## 2019-09-19 MED ORDER — IOHEXOL 300 MG/ML  SOLN
50.0000 mL | Freq: Once | INTRAMUSCULAR | Status: AC | PRN
  Administered 2019-09-19: 7 mL

## 2019-09-19 NOTE — Progress Notes (Addendum)
14 Days Post-Op    CC: Abdominal pain and nausea  Subjective: He looks good this AM.  Drainage is serosanguineous.  His midline abdominal wound looks good.  He is taking more in orally and tolerating a soft diet.  He had some hiccups which he resolved by walking in the room.  Overall marked improvement.  Objective: Vital signs in last 24 hours: Temp:  [97.8 F (36.6 C)-98.2 F (36.8 C)] 98.2 F (36.8 C) (02/04 0552) Pulse Rate:  [67-100] 67 (02/04 0552) Resp:  [16] 16 (02/04 0552) BP: (143-146)/(75-83) 145/83 (02/04 0552) SpO2:  [96 %-99 %] 98 % (02/04 0552) Last BM Date: 09/18/19 1080 p.o. 124 IV 2300 urine 30 cc from the drains -right lower quadrant drain 15 Right posterior drain 15 BM x1 Afebrile vital signs are stable Labs okay WBC down to 9.1   Intake/Output from previous day: 02/03 0701 - 02/04 0700 In: 1204.3 [P.O.:1080; IV Piggyback:114.3] Out: 2330 [Urine:2300; Drains:30] Intake/Output this shift: No intake/output data recorded.  General appearance: alert, cooperative and no distress Resp: clear to auscultation bilaterally Cardio: Regular rate and rhythm GI: soft, non-tender; bowel sounds normal; no masses,  no organomegaly and Midline incision looks good.  He is tolerating some soft diet.  Positive BMs with soft but formed stools. Extremities: extremities normal, atraumatic, no cyanosis or edema  Lab Results:  Recent Labs    09/17/19 0428 09/19/19 0355  WBC 13.8* 9.1  HGB 11.9* 11.8*  HCT 36.8* 37.3*  PLT 266 437*    BMET Recent Labs    09/17/19 0428 09/19/19 0355  NA 134* 136  K 4.0 4.0  CL 98 102  CO2 26 27  GLUCOSE 110* 104*  BUN 11 8  CREATININE 0.95 0.69  CALCIUM 7.7* 8.2*   PT/INR No results for input(s): LABPROT, INR in the last 72 hours.  Recent Labs  Lab 09/19/19 0355  AST 22  ALT 18  ALKPHOS 64  BILITOT 0.3  PROT 6.0*  ALBUMIN 2.4*     Lipase     Component Value Date/Time   LIPASE 21 09/05/2019 0828      Medications: . amLODipine  5 mg Oral Daily  . bismuth subsalicylate  30 mL Oral TID AC & HS  . enoxaparin (LOVENOX) injection  40 mg Subcutaneous Q24H  . feeding supplement (ENSURE ENLIVE)  237 mL Oral QID  . folic acid  1 mg Oral Daily  . lip balm  1 application Topical BID  . mouth rinse  15 mL Mouth Rinse BID  . metoCLOPramide  5 mg Oral TID AC  . metroNIDAZOLE  500 mg Oral Q8H  . multivitamin with minerals  1 tablet Oral Q lunch  . pantoprazole  40 mg Oral BID  . polyethylene glycol  17 g Oral Daily  . sodium chloride flush  5 mL Intracatheter Q8H  . tetracycline  500 mg Oral QID  . thiamine  100 mg Oral Daily   Or  . thiamine  100 mg Intravenous Daily    Assessment/Plan H. pylori positive 09/12/2019 - Pepto, flagyl, tetra 1/29 -->14 days  Hypertension Acute renal insufficiency Tobacco use Large pleural effusion(right base with atelectasis minimal atelectasis)2/1+2/21 Right adrenal lesion Thickening inferior portion of the cecum -inflammatory vs neoplasm -GI follow-up for EGD/colonoscopy  Perforated pyloric ulcer, adjacent firmness concerning for mass versus inflammation, widespread red bilious succus throughout the abdomen, diffuse purulent peritonitis Exploratory laparotomy, gastric biopsy, omental pedicle flap(Graham patch)repair of perforated pyloric ulcer 09/05/2019, Dr. Jens Som Perihepatic/pelvic  fluid collections IR drain placement x2; perihepatic collection/pelvic collection-09/14/2019 (cultures GM + rods, GM - cocci, few yeast(09/16/19) IR plans to inject drains 2/4 today  FEN: IV fluids/full liquids  >> soft ID: Zosyn 1/21>> 13 days;Flagyl/tetracycline 1/29>>day 7; Diflucan 2/1-2/3 [id number] Follow-up: Dr. Windle Guard  Plan: For IR drain evaluation today.  We will discuss discharge after completion of their studies.  I am stopping his Diflucan today his white count is perfectly normal and drainage is clear from his 2 drains.  We will also DC Reglan.   LOS: 14 days    Albert Love 09/19/2019 Please see Amion

## 2019-09-19 NOTE — Progress Notes (Signed)
Physical Therapy Treatment Patient Details Name: Albert Love MRN: [id number] DOB: 1941-02-12 Today's Date: 09/19/2019    History of Present Illness 79 yo male admitted with abdominal pain, perforated ulcer.  s/p exploratory laparotomy, gastric biopsy, omental pedicle flap and  repair of perforated pyloric ulcer    PT Comments    Pt feeling better.  General transfer comment: feeling better, eating solid foods.  Good safety cognition. General Gait Details: good alternating gait with no need to steady self with IV pole.  Tolerated a functional distance, Pt hopes to D/C to home "soon".   Follow Up Recommendations  No PT follow up     Equipment Recommendations       Recommendations for Other Services       Precautions / Restrictions Precautions Precautions: Fall Precaution Comments: abd sx; jp drain (R) Restrictions Weight Bearing Restrictions: No    Mobility  Bed Mobility               General bed mobility comments: OOB in recliner  Transfers Overall transfer level: Needs assistance Equipment used: None Transfers: Sit to/from Stand Sit to Stand: Supervision Stand pivot transfers: Supervision       General transfer comment: feeling better, eating solid foods.  Good safety cognition.  Ambulation/Gait Ambulation/Gait assistance: Supervision Gait Distance (Feet): 225 Feet Assistive device: None Gait Pattern/deviations: Step-through pattern Gait velocity: decreased   General Gait Details: good alternating gait with no need to steady self with IV pole.  Tolerated a functional distance,   Stairs Stairs: Yes Stairs assistance: Supervision Stair Management: No rails;Forwards Number of Stairs: 2 General stair comments: pt with good sequencing for stair mobility, no assist required, pt using bil UE support which he has at home.   Wheelchair Mobility    Modified Rankin (Stroke Patients Only)       Balance                                            Cognition Arousal/Alertness: Awake/alert Behavior During Therapy: WFL for tasks assessed/performed Overall Cognitive Status: Within Functional Limits for tasks assessed                                 General Comments: very willing and motivated      Exercises      General Comments        Pertinent Vitals/Pain Pain Assessment: No/denies pain    Home Living                      Prior Function            PT Goals (current goals can now be found in the care plan section) Progress towards PT goals: Progressing toward goals    Frequency    Min 3X/week      PT Plan Current plan remains appropriate    Co-evaluation              AM-PAC PT "6 Clicks" Mobility   Outcome Measure  Help needed turning from your back to your side while in a flat bed without using bedrails?: None Help needed moving from lying on your back to sitting on the side of a flat bed without using bedrails?: None Help needed moving to and from a bed to a chair (including a  wheelchair)?: None Help needed standing up from a chair using your arms (e.g., wheelchair or bedside chair)?: None Help needed to walk in hospital room?: None Help needed climbing 3-5 steps with a railing? : A Little 6 Click Score: 23    End of Session Equipment Utilized During Treatment: Gait belt   Patient left: in chair;with call bell/phone within reach Nurse Communication: Mobility status PT Visit Diagnosis: Difficulty in walking, not elsewhere classified (R26.2);Pain     Time: 1335-1350 PT Time Calculation (min) (ACUTE ONLY): 15 min  Charges:  $Gait Training: 8-22 mins                     Rica Koyanagi  PTA Acute  Rehabilitation Services Pager      [phone] Office      [phone]

## 2019-09-19 NOTE — Plan of Care (Signed)
  Problem: Clinical Measurements: Goal: Ability to maintain clinical measurements within normal limits will improve Outcome: Progressing Goal: Will remain free from infection Outcome: Progressing Goal: Diagnostic test results will improve Outcome: Progressing   Problem: Pain Managment: Goal: General experience of comfort will improve Outcome: Progressing   Problem: Skin Integrity: Goal: Risk for impaired skin integrity will decrease Outcome: Progressing

## 2019-09-19 NOTE — Discharge Summary (Signed)
Physician Discharge Summary  Patient ID: Albert Love MRN: [id number] DOB/AGE: Dec 17, 1940 79 y.o.  Admit date: 09/05/2019 Discharge date: 09/19/2019  Admission Diagnoses:  Pneumoperitoneum likely secondary to perforated duodenal ulcer Acute renal insufficiency Hypertension  Discharge Diagnoses: Perforated pyloric ulcer with diffuse purulent peritonitis Perihepatic/pelvic abscess -IR drain placement x2 09/14/2019 H. pylori + 09/12/2019 Acute renal insufficiency Hypertension Large pleural effusions Right adrenal lesion Thickening inferior portion of the cecum -inflammatory versus neoplastic   Principal Problem:   Perforated gastric ulcer s/p omental [name] patch 09/05/2019 Active Problems:   Pneumoperitoneum   Acute renal insufficiency   Coagulopathy (HCC)   PROCEDURES:  Exploratory laparotomy, gastric biopsy, omental pedicel flap(Graham patch) repair of perforated pyloric ulcer 09/05/2019 Dr. Jens Som  Perihepatic and pelvic IR drain placements 09/14/2019  Hospital Course:  Patient is a 79 year old male who presented to [facility] with abdominal pain since yesterday around 3-4 pm. Patient reports pain is generalized and is severe. He denies any vomiting, but has had a little nausea.  He states that he has been having some vague crampy pain in his abdomen for several days but thought nothing of it. Has never experienced symptoms like this previously. Last BM was yesterday.  He denies use of NSAIDs, BC powder, goodys powder etc.  He does smoke 0.5ppd, 2 shots, and occasional caffeine.  Patient also reports some associated SOB secondary to abdominal pain with deep breathing. Denies fever, chills, chest pain, urinary symptoms cough, or recent exposure to anyone that has known COVID.   He presented to the ED today secondary to worsening abdominal pain.  He has a CT scan that reveals perforation of his duodenum with free air and fluid.  We have been asked to see him for further evaluation and  management.    Hospital Course: He was seen in the emergency department and taken the operating room by Dr. Jens Som later that a.m.  Patient underwent procedure as described above.  He tolerated procedure well.  With rehydration his creatinine improved.  Blood pressure is elevated this was controlled with IV medications.  Patient reported lots of hiccups, feeling nauseated at times but no emesis he is now taking much in way of liquids CT scan was ordered on 09/13/2019.  He was also found to be H. pylori positive and was started on quadruple therapy per GI recommendations. He received 7 days of H pylori treatment in the hospital and will be discharged on 7 more days. CT scan showed an abscess over his liver and in his pelvis.  IR was contacted and IR drain was placed as noted above. He had an ileus after this and was slow to recover.  IV Reglan was added to help with his postop ileus.  Repeat CT scan on 09/17/2019, showed good drainage of the inferior right hepatic collection and prerectal pelvic collection. IR drains were removed on 02/04. Patient worked with PT who recommended no follow up. On 02/05, the patient was voiding, tolerating a diet, VSS, incision well healing and felt stable for discharge home.   Patient was discharge in good condition. The [place] substance control database was reviewed prior to prescribing narcotic pain medicine to this patient.   PE: General: well appearing, NAD, pleasant Cardio: RRR Lungs: CTA BL, no W/R/R noted, rate and effort normal Abd: soft, ND, NT, midline is clean with good base of granulation tissue and is without signs of infection, drain sites are C/D/I.  Skin: warm and dry Extremities: no BLE edema  Disposition: DC home   Allergies as of 09/20/2019   No Known Allergies     Medication List    TAKE these medications   acetaminophen 325 MG tablet Commonly known as: Tylenol Take 2 tablets (650 mg total) by mouth every 4 (four) hours as needed.    amLODipine 5 MG tablet Commonly known as: NORVASC Take 1 tablet (5 mg total) by mouth daily.   bismuth subsalicylate [id number] suspension Commonly known as: PEPTO BISMOL Take 30 mLs by mouth 4 (four) times daily -  before meals and at bedtime.   dimenhyDRINATE 50 MG tablet Commonly known as: DRAMAMINE Take 50 mg by mouth every 8 (eight) hours as needed.   metroNIDAZOLE 500 MG tablet Commonly known as: FLAGYL Take 1 tablet (500 mg total) by mouth every 8 (eight) hours for 7 days.   multivitamin with minerals Tabs tablet Take 1 tablet by mouth daily with lunch.   oxyCODONE 5 MG immediate release tablet Commonly known as: Oxy IR/ROXICODONE Take 1 tablet (5 mg total) by mouth every 6 (six) hours as needed for severe pain or breakthrough pain.   pantoprazole 40 MG tablet Commonly known as: PROTONIX Take 1 tablet (40 mg total) by mouth 2 (two) times daily.   polyethylene glycol 17 g packet Commonly known as: MIRALAX / GLYCOLAX Take 17 g by mouth daily as needed for moderate constipation or severe constipation.   tetracycline 500 MG capsule Commonly known as: SUMYCIN Take 1 capsule (500 mg total) by mouth 4 (four) times daily.      Follow-up Information    Clovis Riley, MD Follow up on 10/03/2019.   Specialty: General Surgery Why: 9:50am, arrive by 9:20am for check in and paperwork.  Please bring photo ID and insurance card with you Contact information: [address] [place] [address] [phone]        you need to obtain a primary care doctor in this area Follow up.        Gatha Mayer, MD Follow up.   Specialty: Gastroenterology Why: Call and ask for follow up in 5-6 weeks you will need an upper endoscopy and a colonoscopy.  You did not see Dr. Carlean Purl in the hospital.   Contact information: [address] [place] [address] [phone]           Signed: Jackson Latino, [facility]

## 2019-09-20 LAB — AEROBIC/ANAEROBIC CULTURE W GRAM STAIN (SURGICAL/DEEP WOUND)

## 2019-09-20 MED ORDER — AMLODIPINE BESYLATE 5 MG PO TABS: 5.0000 mg | ORAL_TABLET | Freq: Every day | ORAL | 0 refills | Status: DC

## 2019-09-20 MED ORDER — METRONIDAZOLE 500 MG PO TABS: 500.0000 mg | ORAL_TABLET | Freq: Three times a day (TID) | ORAL | 0 refills | Status: AC

## 2019-09-20 MED ORDER — TETRACYCLINE HCL 250 MG PO CAPS
500.0000 mg | ORAL_CAPSULE | Freq: Four times a day (QID) | ORAL | Status: DC
  Administered 2019-09-20 (×2): 500 mg via ORAL
  Filled 2019-09-20 (×4): qty 2

## 2019-09-20 MED ORDER — BISMUTH SUBSALICYLATE 262 MG/15ML PO SUSP: 30.0000 mL | Freq: Three times a day (TID) | ORAL | 0 refills | Status: DC

## 2019-09-20 MED ORDER — ACETAMINOPHEN 325 MG PO TABS: 650.0000 mg | ORAL_TABLET | ORAL | 2 refills | Status: DC | PRN

## 2019-09-20 MED ORDER — TETRACYCLINE HCL 500 MG PO CAPS: 500.0000 mg | ORAL_CAPSULE | Freq: Four times a day (QID) | ORAL | 0 refills | Status: DC

## 2019-09-20 MED ORDER — PANTOPRAZOLE SODIUM 40 MG PO TBEC: 40.0000 mg | DELAYED_RELEASE_TABLET | Freq: Two times a day (BID) | ORAL | 1 refills | Status: DC

## 2019-09-20 MED ORDER — OXYCODONE HCL 5 MG PO TABS: 5.0000 mg | ORAL_TABLET | Freq: Four times a day (QID) | ORAL | 0 refills | Status: DC | PRN

## 2019-09-20 MED ORDER — POLYETHYLENE GLYCOL 3350 17 G PO PACK: 17.0000 g | PACK | Freq: Every day | ORAL | 0 refills | Status: DC | PRN

## 2019-09-20 MED ORDER — ADULT MULTIVITAMIN W/MINERALS CH: 1.0000 | ORAL_TABLET | Freq: Every day | ORAL | Status: DC

## 2019-09-20 NOTE — TOC Transition Note (Signed)
Transition of Care [facility]) - CM/SW Discharge Note   Patient Details  Name: Nir Delage MRN: [id number] Date of Birth: [birth year]  Transition of Care [facility]) CM/SW Contact:  Lia Hopping, [place] Phone Number: 09/20/2019, 2:36 PM   Clinical Narrative:    CSW reached out to [place] listed below. [place] agreeable to provide wound care starting Tuesday or Wednesday of next week. Nurse reports the patient feels very comfortable with dressing changes and will have family support. Patient will discharge home with supplies.   [facility]- no staffing [facility]- no staffing  [facility]- no staffing  [facility]-no staffing [facility]- no staffing.  [facility].  [facility]- no staffing.    Final next level of care: Home w Home Health Services Barriers to Discharge: No Barriers Identified   Patient Goals and CMS Choice   CMS Medicare.gov Compare Post Acute Care list provided to:: Patient Choice offered to / list presented to : Patient  Discharge Placement                Patient to be transferred to facility by: Son   Patient and family notified of of transfer: 09/20/19  Discharge Plan and Services In-house Referral: Clinical Social Work Discharge Planning Services: CM Consult Post Acute Care Choice: NA                    HH Arranged: RN [place]: ([place]) Date [place]: 09/20/19 Time [place]: [place] Representative spoke with at [place]: [place] ([place]) Interventions     Readmission Risk Interventions No flowsheet data found.

## 2019-09-20 NOTE — Discharge Instructions (Signed)
[facility], [place] [id number]  OPEN ABDOMINAL SURGERY: POST OP INSTRUCTIONS  Always review your discharge instruction sheet given to you by the facility where your surgery was performed.  IF YOU HAVE DISABILITY OR FAMILY LEAVE FORMS, YOU MUST BRING THEM TO THE OFFICE FOR PROCESSING.  PLEASE DO NOT GIVE THEM TO YOUR DOCTOR.  1. A prescription for pain medication may be given to you upon discharge.  Take your pain medication as prescribed, if needed.  If narcotic pain medicine is not needed, then you may take acetaminophen (Tylenol) or ibuprofen (Advil) as needed. 2. Take your usually prescribed medications unless otherwise directed. 3. If you need a refill on your pain medication, please contact your pharmacy. They will contact our office to request authorization.  Prescriptions will not be filled after 5pm or on week-ends. 4. You should follow a light diet the first few days after arrival home, such as soup and crackers, pudding, etc.unless your doctor has advised otherwise. A high-fiber, low fat diet can be resumed as tolerated.   Be sure to include lots of fluids daily. Most patients will experience some swelling and bruising on the chest and neck area.  Ice packs will help.  Swelling and bruising can take several days to resolve 5. Most patients will experience some swelling and bruising in the area of the incision. Ice pack will help. Swelling and bruising can take several days to resolve..  6. It is common to experience some constipation if taking pain medication after surgery.  Increasing fluid intake and taking a stool softener will usually help or prevent this problem from occurring.  A mild laxative (Milk of Magnesia or Miralax) should be taken according to package directions if there are no bowel movements after 48 hours. 7.  You may have steri-strips (small skin tapes) in place directly over the incision.  These strips should be left on the skin for 7-10 days.  If your  surgeon used skin glue on the incision, you may shower in 24 hours.  The glue will flake off over the next 2-3 weeks.  Any sutures or staples will be removed at the office during your follow-up visit. You may find that a light gauze bandage over your incision may keep your staples from being rubbed or pulled. You may shower and replace the bandage daily. 8. ACTIVITIES:  You may resume regular (light) daily activities beginning the next day--such as daily self-care, walking, climbing stairs--gradually increasing activities as tolerated.  You may have sexual intercourse when it is comfortable.  Refrain from any heavy lifting or straining until approved by your doctor. a. You may drive when you no longer are taking prescription pain medication, you can comfortably wear a seatbelt, and you can safely maneuver your car and apply brakes b. Return to Work: ___________________________________ 4. You should see your doctor in the office for a follow-up appointment approximately two weeks after your surgery.  Make sure that you call for this appointment within a day or two after you arrive home to insure a convenient appointment time. OTHER INSTRUCTIONS:  _____________________________________________________________ _____________________________________________________________  WHEN TO CALL YOUR DOCTOR: 1. Fever over 101.0 2. Inability to urinate 3. Nausea and/or vomiting 4. Extreme swelling or bruising 5. Continued bleeding from incision. 6. Increased pain, redness, or drainage from the incision. 7. Difficulty swallowing or breathing change.  Ordered 8.  9. Below and the time she was back in today waiting on results in her right leg ordered  on the note output in the resolved later in output check from operative document finger MRI cannot.  Mr.Clark has normal bowel x-ray he looks good he is quiet well and there is bilirubin flat is mine is gone NG and which is going to come out to school reporting up to  full liquids as, clamp is rectal.  I did order labs on him is up with the tube still in there wound should be able I did order labs on him because of we will check his labs in 2 days and below level of 6.3 fractures 10. Lack of [place] looks good he is supposed to get a drain study and I am here for his drain.  He put out 15 cc from each arm yesterday.  Not much tenderness and serous 11.  12. Unpacking he might be able to get out and go home later he Hassell Done ruptured medicine following 13. Muscle cramping or spasms. 14. Numbness or tingling in hands or feet or around lips.  The clinic staff is available to answer your questions during regular business hours.  Please dont hesitate to call and ask to speak to one of the nurses if you have concerns.  For further questions, please visit [email or web address]   MIDLINE WOUND CARE: - midline dressing to be changed twice daily - supplies: sterile saline, kerlix, scissors, ABD pads, tape  - remove dressing and all packing carefully, moistening with sterile saline as needed to avoid packing/internal dressing sticking to the wound. - clean edges of skin around the wound with water/gauze, making sure there is no tape debris or leakage left on skin that could cause skin irritation or breakdown. - dampen and clean kerlix with sterile saline and pack wound from wound base to skin level, making sure to take note of any possible areas of wound tracking, tunneling and packing appropriately. Wound can be packed loosely. Trim kerlix to size if a whole kerlix is not required. - cover wound with a dry ABD pad and secure with tape.  - write the date/time on the dry dressing/tape to better track when the last dressing change occurred. - apply any skin protectant/powder recommended by clinician to protect skin/skin folds. - change dressing as needed if leakage occurs, wound gets contaminated, or patient requests to shower. - patient may shower daily with wound  open and following the shower the wound should be dried and a clean dressing placed.     [facility] Stay Proper nutrition can help your body recover from illness and injury.   Foods and beverages high in protein, vitamins, and minerals help rebuild muscle loss, promote healing, & reduce fall risk.   In addition to eating healthy foods, a nutrition shake is an easy, delicious way to get the nutrition you need during and after your hospital stay  It is recommended that you continue to drink 3-4 bottles per day of:       Any high protein supplement for at least 1 month (30 days) after your hospital stay   Tips for adding a nutrition shake into your routine: As allowed, drink one with vitamins or medications instead of water or juice Enjoy one as a tasty mid-morning or afternoon snack Drink cold or make a milkshake out of it Drink one instead of milk with cereal or snacks Use as a coffee creamer   Available at the following grocery stores and pharmacies:           Kristopher Oppenheim *hemoglobin stable  Return of bowel function   He may be wrong allergies.  Soft stools they are better and they were talking about a taper on him he does have a hernia umbilical hernia which was reducible.  Sending her home protamine she is acute.  She had the shakes he states she is on a soft diet but all she had was quite full liquids she has some yogurt and some juice about that I ordered OT and PT we will go back to check on her later I switch her over to Augmentin will undergo surgery Augmentin she is allergic to Augmentin according to her with Cipro and Cipro Flagyl    Your dressing change breast holder if she cleaned nothing well I think but the biggest problem with her is 1 she cannot reach back.  Get it and to she can wash it herself, I am seeing pulmonology and her mother will I have not seen Augmentin given failure and.  For the back she will be ordered home health aide because there is  still in available so I am [place].  Couple more.  Previously 13.1 it is okay for concerns if she is she is on full code serous and I order labs for the a.m. on her will avoid.  He is on.  His creatinine is still up a little bit.  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  Now he needs Elnora Morrison noted differently by difficulty with she got contrast in her colon but she has not had any stools so I just put her on clears her to get up to walk her told her if she did not do anything a little bit we might give her like a suppository.  Jumpstart thanks later 1503 he saw but she is supposed to get an IR drain and once you so and [place] Silos he saw the reason this right request that has 2+ kids with kids with both kids and there requesting limited [facility] and we decided to do with this and was waiting    The there are so you can check with patient care with the whole plant that was exposed to restrictive insulin be [address] * [facility]  * [facility]          * [employer] * [employer]  * [facility]      * Target  * [employer]   * [facility]  * [place]   * [place] [phone]            For COUPONS visit: www.ensure.com/join or [email or web address]-[email or web address]   Suggested Substitutions Ensure Plus = Boost Plus = Carnation Breakfast Essentials = Boost Compact Ensure Active Clear = Boost Breeze Glucerna Shake = Boost Glucose Control = Carnation Breakfast Essentials SUGAR FREE Ensure Enlive = Boost High Protein

## 2019-09-22 LAB — AEROBIC/ANAEROBIC CULTURE W GRAM STAIN (SURGICAL/DEEP WOUND): Special Requests: NORMAL

## 2019-11-01 ENCOUNTER — Emergency Department (HOSPITAL_COMMUNITY)
Admission: EM | Admit: 2019-11-01 | Discharge: 2019-11-02 | Disposition: A | Discharge status: Home / Self Care | Payer: Medicare PPO | Attending: Emergency Medicine | Admitting: Emergency Medicine

## 2019-11-01 DIAGNOSIS — I951 Orthostatic hypotension: Secondary | ICD-10-CM

## 2019-11-01 DIAGNOSIS — Z79899 Other long term (current) drug therapy: Secondary | ICD-10-CM | POA: Insufficient documentation

## 2019-11-01 DIAGNOSIS — D649 Anemia, unspecified: Secondary | ICD-10-CM | POA: Diagnosis not present

## 2019-11-01 DIAGNOSIS — F17210 Nicotine dependence, cigarettes, uncomplicated: Secondary | ICD-10-CM | POA: Insufficient documentation

## 2019-11-01 DIAGNOSIS — R55 Syncope and collapse: Secondary | ICD-10-CM | POA: Diagnosis present

## 2019-11-01 NOTE — ED Triage Notes (Signed)
Pt BIB [facility] after pt stood up in his bedroom. Pt became dizzy and fell over. EMS reports that family heard pt fall and ran into the room. Unsure if pt hit their head. Pt does not take blood thinners, denies N/V or SHOB. Pt does endorse that he has not been eating like normal today.   With EMS pt had positive orthostatic vital signs. Pt is A&Ox4.

## 2019-11-01 NOTE — ED Provider Notes (Signed)
[facility] DEPT Provider Note   CSN: [id number] Arrival date & time: 11/01/19  2348   History Chief Complaint  Patient presents with  . Near Syncope    Albert Love is a 79 y.o. male.  The history is provided by the patient.  He has history of perforated gastric ulcer and comes in after an episode of near syncope at home.  He states that he was either sitting or laying and he got up to do something and felt dizzy and slumped to the ground.  He does not think he completely lost consciousness.  He denies chest pain, heaviness, tightness, pressure.  He denies nausea, vomiting.  He denies diaphoresis.  He felt normal within only a few minutes.  He denies previous episodes like this before.  He does admit that he had not eaten anything since breakfast and drank very little during the course of the day.  He states he just was not hungry.  EMS noted significant rise in his heart rate with standing.  He is not on any anticoagulants.  Past Medical History:  Diagnosis Date  . Acute renal insufficiency 09/05/2019  . Perforated gastric ulcer s/p omental [name] patch 09/05/2019 09/05/2019    Patient Active Problem List   Diagnosis Date Noted  . Pneumoperitoneum 09/05/2019  . Acute renal insufficiency 09/05/2019  . Perforated gastric ulcer s/p omental [name] patch 09/05/2019 09/05/2019  . Coagulopathy ([place]) 09/05/2019    Past Surgical History:  Procedure Laterality Date  . BOWEL RESECTION N/A 09/05/2019   Procedure: SMALL BOWEL RESECTION;  Surgeon: Clovis Riley, MD;  Location: [facility];  Service: General;  Laterality: N/A;  . IR SINUS/FIST TUBE CHK-NON GI  09/19/2019  . IR SINUS/FIST TUBE CHK-NON GI  09/19/2019  . LAPAROTOMY N/A 09/05/2019   Procedure: EXPLORATORY LAPAROTOMY WITH [facility] AND GASTRIC BIOPSY;  Surgeon: Clovis Riley, MD;  Location: [facility];  Service: General;  Laterality: N/A;       No family history on file.  Social History   Tobacco  Use  . Smoking status: Current Every Day Smoker    Packs/day: 0.50    Types: Cigarettes  . Smokeless tobacco: Never Used  Substance Use Topics  . Alcohol use: Not on file  . Drug use: Not on file    Home Medications Prior to Admission medications   Medication Sig Start Date End Date Taking? Authorizing Provider  acetaminophen (TYLENOL) 325 MG tablet Take 2 tablets (650 mg total) by mouth every 4 (four) hours as needed. 09/20/19 09/19/20  Kalman Drape, PA  amLODipine (NORVASC) 5 MG tablet Take 1 tablet (5 mg total) by mouth daily. 09/20/19   Focht, Fraser Din, PA  bismuth subsalicylate (PEPTO BISMOL) 262 MG/15ML suspension Take 30 mLs by mouth 4 (four) times daily -  before meals and at bedtime. 09/20/19   Focht, Fraser Din, PA  dimenhyDRINATE (DRAMAMINE) 50 MG tablet Take 50 mg by mouth every 8 (eight) hours as needed.    [provider]  Multiple Vitamin (MULTIVITAMIN WITH MINERALS) TABS tablet Take 1 tablet by mouth daily with lunch. 09/20/19   Focht, Fraser Din, PA  oxyCODONE (OXY IR/ROXICODONE) 5 MG immediate release tablet Take 1 tablet (5 mg total) by mouth every 6 (six) hours as needed for severe pain or breakthrough pain. 09/20/19   Focht, Fraser Din, PA  pantoprazole (PROTONIX) 40 MG tablet Take 1 tablet (40 mg total) by mouth 2 (two) times daily. 09/20/19 10/20/19  Jackson Latino  L, PA  polyethylene glycol (MIRALAX / GLYCOLAX) 17 g packet Take 17 g by mouth daily as needed for moderate constipation or severe constipation. 09/20/19   Focht, Fraser Din, PA  tetracycline (SUMYCIN) 500 MG capsule Take 1 capsule (500 mg total) by mouth 4 (four) times daily. 09/20/19   Kalman Drape, PA    Allergies    Patient has no known allergies.  Review of Systems   Review of Systems  All other systems reviewed and are negative.   Physical Exam Updated Vital Signs BP (!) 150/88   Pulse 83   Temp 99 F (37.2 C)   Resp 20   Ht 5\' 11"  (1.803 m)   Wt 72.6 kg   SpO2 99%   BMI 22.32 kg/m    Physical Exam Vitals and nursing note reviewed.   79 year old male, resting comfortably and in no acute distress. Vital signs are significant for elevated systolic blood pressure. Oxygen saturation is 99%, which is normal. Head is normocephalic and atraumatic. PERRLA, EOMI. Oropharynx is clear. Neck is nontender and supple without adenopathy or JVD.  There are no carotid bruits. Back is nontender and there is no CVA tenderness. Lungs are clear without rales, wheezes, or rhonchi. Chest is nontender. Heart has regular rate and rhythm without murmur. Abdomen is soft, flat, nontender without masses or hepatosplenomegaly and peristalsis is normoactive. Extremities have no cyanosis or edema, full range of motion is present. Skin is warm and dry without rash. Neurologic: Mental status is normal, cranial nerves are intact, there are no motor or sensory deficits.  ED Results / Procedures / Treatments   Labs (all labs ordered are listed, but only abnormal results are displayed) Labs Reviewed  BASIC METABOLIC PANEL - Abnormal; Notable for the following components:      Result Value   Glucose, Bld 102 (*)    All other components within normal limits  CBC WITH DIFFERENTIAL/PLATELET - Abnormal; Notable for the following components:   Hemoglobin 12.4 (*)    All other components within normal limits  TROPONIN I (HIGH SENSITIVITY)  TROPONIN I (HIGH SENSITIVITY)    EKG EKG Interpretation  Date/Time:  Saturday November 02 2019 00:05:52 EDT Ventricular Rate:  80 PR Interval:    QRS Duration: 95 QT Interval:  357 QTC Calculation: 412 R Axis:   71 Text Interpretation: Sinus rhythm Normal ECG When compared with ECG of 09/05/2019, Nonspecific T wave abnormality is no longer present Confirmed by Delora Fuel ([id number]) on 11/02/2019 12:20:20 AM  Procedures Procedures   Medications Ordered in ED Medications  sodium chloride 0.9 % bolus 1,000 mL (0 mLs Intravenous Stopped 11/02/19 0231)    ED  Course  I have reviewed the triage vital signs and the nursing notes.  Pertinent labs & imaging results that were available during my care of the patient were reviewed by me and considered in my medical decision making (see chart for details).  MDM Rules/Calculators/A&P Near syncope which seems orthostatic based on history.  Will recheck orthostatic vital signs here.  Will check ECG and troponin, but doubt cardiac etiology.  Old records are reviewed confirming recent hospitalization for perforated gastric ulcer.  We will give IV fluids while waiting for lab results.  Orthostatic vital signs showed a rise in heart rate although it was less than 20 bpm.  No change in work-up or treatment plan.  Following IV fluids, patient was able to ambulate without difficulty.  Labs show mild anemia which is actually improved over  baseline, otherwise unremarkable.  ECG shows no acute changes, prior T wave changes have resolved.  Troponin is normal x2.  He is felt to be safe for discharge, encouraged to drink sufficient fluids.  He is to follow-up with his gastroenterologist for ongoing management of his perforated ulcer.  Final Clinical Impression(s) / ED Diagnoses Final diagnoses:  Orthostatic syncope  Normochromic normocytic anemia    Rx / DC Orders ED Discharge Orders    None       Delora Fuel, MD [id number] [phone]

## 2019-11-02 ENCOUNTER — Encounter (HOSPITAL_COMMUNITY): Payer: Self-pay

## 2019-11-02 LAB — BASIC METABOLIC PANEL
Anion gap: 8 (ref 5–15)
BUN: 10 mg/dL (ref 8–23)
CO2: 25 mmol/L (ref 22–32)
Calcium: 8.9 mg/dL (ref 8.9–10.3)
Chloride: 105 mmol/L (ref 98–111)
Creatinine, Ser: 1.04 mg/dL (ref 0.61–1.24)
GFR calc Af Amer: >60 mL/min (ref >60)
GFR calc non Af Amer: >60 mL/min (ref >60)
Glucose, Bld: 102 mg/dL — ABNORMAL HIGH (ref 70–99)
Potassium: 4 mmol/L (ref 3.5–5.1)
Sodium: 138 mmol/L (ref 135–145)

## 2019-11-02 LAB — TROPONIN I (HIGH SENSITIVITY)
Troponin I (High Sensitivity): 3 ng/L (ref <18)
Troponin I (High Sensitivity): 2 ng/L (ref <18)

## 2019-11-02 LAB — CBC WITH DIFFERENTIAL/PLATELET
Abs Immature Granulocytes: 0.03 10*3/uL (ref 0.00–0.07)
Basophils Absolute: 0 10*3/uL (ref 0.0–0.1)
Basophils Relative: 0 %
Eosinophils Absolute: 0.1 10*3/uL (ref 0.0–0.5)
Eosinophils Relative: 2 %
HCT: 40.4 % (ref 39.0–52.0)
Hemoglobin: 12.4 g/dL — ABNORMAL LOW (ref 13.0–17.0)
Immature Granulocytes: 0 %
Lymphocytes Relative: 26 %
Lymphs Abs: 2 10*3/uL (ref 0.7–4.0)
MCH: 26.2 pg (ref 26.0–34.0)
MCHC: 30.7 g/dL (ref 30.0–36.0)
MCV: 85.2 fL (ref 80.0–100.0)
Monocytes Absolute: 0.6 10*3/uL (ref 0.1–1.0)
Monocytes Relative: 8 %
Neutro Abs: 5 10*3/uL (ref 1.7–7.7)
Neutrophils Relative %: 64 %
Platelets: 235 10*3/uL (ref 150–400)
RBC: 4.74 MIL/uL (ref 4.22–5.81)
RDW: 15.3 % (ref 11.5–15.5)
WBC: 7.8 10*3/uL (ref 4.0–10.5)
nRBC: 0 % (ref 0.0–0.2)

## 2019-11-02 MED ORDER — SODIUM CHLORIDE 0.9 % IV BOLUS
1000.0000 mL | Freq: Once | INTRAVENOUS | Status: AC
  Administered 2019-11-02: 1000 mL via INTRAVENOUS

## 2019-11-02 NOTE — Discharge Instructions (Addendum)
Make sure that you drink enough fluids.

## 2019-11-02 NOTE — ED Notes (Signed)
Pt ambulated to and from bathroom with a steady gait. No assistance required. Pt denied dizziness.

## 2019-11-12 ENCOUNTER — Encounter: Payer: Self-pay | Admitting: Internal Medicine

## 2019-12-31 ENCOUNTER — Encounter: Payer: Self-pay | Admitting: Internal Medicine

## 2019-12-31 ENCOUNTER — Other Ambulatory Visit: Payer: Self-pay

## 2019-12-31 ENCOUNTER — Ambulatory Visit: Payer: Medicare PPO | Admitting: Internal Medicine

## 2019-12-31 VITALS — BP 130/70 | HR 87 | Ht 71.0 in | Wt 150.0 lb

## 2019-12-31 DIAGNOSIS — R933 Abnormal findings on diagnostic imaging of other parts of digestive tract: Secondary | ICD-10-CM

## 2019-12-31 DIAGNOSIS — K255 Chronic or unspecified gastric ulcer with perforation: Secondary | ICD-10-CM | POA: Diagnosis not present

## 2019-12-31 DIAGNOSIS — R768 Other specified abnormal immunological findings in serum: Secondary | ICD-10-CM | POA: Diagnosis not present

## 2019-12-31 NOTE — Progress Notes (Signed)
   Albert Love 79 y.o. [birth year] [id number]  Assessment & Plan:   Encounter Diagnoses  Name Primary?  . Gastric ulcer with perforation, unspecified chronicity ([place]) Yes  . Abnormal CT scan, colon - cecum   . Helicobacter pylori antibody positive     EGD - f/u gastric ulcer and bx to make sure H pylori eradicated Colonoscopy - evaluate abnormal cecum on CT  The risks and benefits as well as alternatives of endoscopic procedure(s) have been discussed and reviewed. All questions answered. The patient agrees to proceed.  Cc: Romana Juniper, MD Subjective:   Chief Complaint: f/u after perforated gastric ulcer and abnormal cecum on CT  HPI 79 yo bm originally from [place] admitted 09/05/19 to 2/4 21 - had perforated gastric ulcer with peritonitis treated with omental [name] patch , perihepatic and pelvic abscesses drained by IR, H. Pylori + treated w/ Abx, and abnormal cecum on a CT scan.  Was Tx w/ PPI, metronidazole, bismuth and TCN x 2 weeks (H pylori + IGG)  Sent for f/u exam and EGD, colonoscopy by surgery.  Doing well now w/ GI issues - getting stronger.  He is here w/ daughter - the above transpired not long after moving here from [facility]. No Known Allergies No outpatient medications have been marked as taking for the 12/31/19 encounter (Office Visit) with Gatha Mayer, MD.   Past Medical History:  Diagnosis Date  . Acute renal insufficiency 09/05/2019  . Perforated gastric ulcer s/p omental [name] patch 09/05/2019 09/05/2019   Past Surgical History:  Procedure Laterality Date  . BOWEL RESECTION N/A 09/05/2019   Procedure: SMALL BOWEL RESECTION;  Surgeon: Clovis Riley, MD;  Location: [facility];  Service: General;  Laterality: N/A;  . IR SINUS/FIST TUBE CHK-NON GI  09/19/2019  . IR SINUS/FIST TUBE CHK-NON GI  09/19/2019  . 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   Review of Systems As above  Objective:   Physical Exam BP 130/70   Pulse 87   Ht 5\' 11"  (1.803 m)   Wt 150 lb (68 kg)   BMI 20.92 kg/m  Muddy sclerae Dentures in mouth Lungs cta Cor NL abd flat upper midline scar BS + no HSM, mass approp mood affect   Data reviewed - see HPI   Lab Results  Component Value Date   WBC 7.8 11/02/2019   HGB 12.4 (L) 11/02/2019   HCT 40.4 11/02/2019   MCV 85.2 11/02/2019   PLT 235 11/02/2019

## 2019-12-31 NOTE — Patient Instructions (Signed)
It has been recommended to you by your physician that you have a(n) EGD/Colonoscopy completed. Per your request, we did not schedule the procedure(s) today. Please contact our office at [phone] should you decide to have the procedure completed. You will be scheduled for a pre-visit and procedure at that time.   I appreciate the opportunity to care for you. Silvano Rusk, MD, [facility]

## 2020-01-03 ENCOUNTER — Encounter: Payer: Self-pay | Admitting: Internal Medicine

## 2020-01-31 ENCOUNTER — Telehealth: Payer: Self-pay | Admitting: *Deleted

## 2020-01-31 NOTE — Telephone Encounter (Signed)
Patient no show pre-visit today. Called patient, no answer, left a message for him to call us back before 5 pm to reschedule the nurse visit. We will try to call him again on Monday before we cancel the procedures.

## 2020-02-06 NOTE — Telephone Encounter (Signed)
Patient was called today to see if he wants to reschedule the colonoscopy. Pt declined because he does not have transportation. He will call us back to reschedule when he knows his son's schedule per Salvadore Farber.

## 2020-02-14 ENCOUNTER — Encounter: Payer: Medicare PPO | Admitting: Internal Medicine

## 2020-10-19 ENCOUNTER — Encounter (HOSPITAL_COMMUNITY): Payer: Self-pay

## 2020-10-19 ENCOUNTER — Observation Stay (HOSPITAL_COMMUNITY): Payer: Medicare HMO

## 2020-10-19 ENCOUNTER — Observation Stay (HOSPITAL_COMMUNITY)
Admission: EM | Admit: 2020-10-19 | Discharge: 2020-10-21 | Disposition: A | Discharge status: Home / Self Care | Payer: Medicare HMO | Attending: Family Medicine | Admitting: Family Medicine

## 2020-10-19 ENCOUNTER — Emergency Department (HOSPITAL_COMMUNITY): Payer: Medicare HMO

## 2020-10-19 ENCOUNTER — Other Ambulatory Visit: Payer: Self-pay

## 2020-10-19 DIAGNOSIS — Z79899 Other long term (current) drug therapy: Secondary | ICD-10-CM | POA: Diagnosis not present

## 2020-10-19 DIAGNOSIS — F17210 Nicotine dependence, cigarettes, uncomplicated: Secondary | ICD-10-CM | POA: Diagnosis not present

## 2020-10-19 DIAGNOSIS — H3411 Central retinal artery occlusion, right eye: Secondary | ICD-10-CM | POA: Diagnosis not present

## 2020-10-19 DIAGNOSIS — E119 Type 2 diabetes mellitus without complications: Secondary | ICD-10-CM | POA: Diagnosis not present

## 2020-10-19 DIAGNOSIS — H5461 Unqualified visual loss, right eye, normal vision left eye: Secondary | ICD-10-CM | POA: Diagnosis present

## 2020-10-19 DIAGNOSIS — I10 Essential (primary) hypertension: Secondary | ICD-10-CM | POA: Diagnosis not present

## 2020-10-19 DIAGNOSIS — H3410 Central retinal artery occlusion, unspecified eye: Secondary | ICD-10-CM | POA: Diagnosis present

## 2020-10-19 DIAGNOSIS — I6782 Cerebral ischemia: Secondary | ICD-10-CM | POA: Diagnosis not present

## 2020-10-19 DIAGNOSIS — R2689 Other abnormalities of gait and mobility: Secondary | ICD-10-CM | POA: Diagnosis not present

## 2020-10-19 LAB — DIFFERENTIAL
Abs Immature Granulocytes: 0.01 10*3/uL (ref 0.00–0.07)
Basophils Absolute: 0 10*3/uL (ref 0.0–0.1)
Basophils Relative: 0 %
Eosinophils Absolute: 0.2 10*3/uL (ref 0.0–0.5)
Eosinophils Relative: 2 %
Immature Granulocytes: 0 %
Lymphocytes Relative: 24 %
Lymphs Abs: 1.9 10*3/uL (ref 0.7–4.0)
Monocytes Absolute: 0.6 10*3/uL (ref 0.1–1.0)
Monocytes Relative: 7 %
Neutro Abs: 5.3 10*3/uL (ref 1.7–7.7)
Neutrophils Relative %: 67 %

## 2020-10-19 LAB — CBC
HCT: 36.7 % — ABNORMAL LOW (ref 39.0–52.0)
Hemoglobin: 11.1 g/dL — ABNORMAL LOW (ref 13.0–17.0)
MCH: 23.2 pg — ABNORMAL LOW (ref 26.0–34.0)
MCHC: 30.2 g/dL (ref 30.0–36.0)
MCV: 76.6 fL — ABNORMAL LOW (ref 80.0–100.0)
Platelets: 279 10*3/uL (ref 150–400)
RBC: 4.79 MIL/uL (ref 4.22–5.81)
RDW: 16.5 % — ABNORMAL HIGH (ref 11.5–15.5)
WBC: 7.9 10*3/uL (ref 4.0–10.5)
nRBC: 0 % (ref 0.0–0.2)

## 2020-10-19 LAB — COMPREHENSIVE METABOLIC PANEL
ALT: 8 U/L (ref 0–44)
AST: 13 U/L — ABNORMAL LOW (ref 15–41)
Albumin: 3.1 g/dL — ABNORMAL LOW (ref 3.5–5.0)
Alkaline Phosphatase: 62 U/L (ref 38–126)
Anion gap: 9 (ref 5–15)
BUN: 10 mg/dL (ref 8–23)
CO2: 24 mmol/L (ref 22–32)
Calcium: 8.9 mg/dL (ref 8.9–10.3)
Chloride: 104 mmol/L (ref 98–111)
Creatinine, Ser: 1.08 mg/dL (ref 0.61–1.24)
GFR, Estimated: >60 mL/min (ref >60)
Glucose, Bld: 96 mg/dL (ref 70–99)
Potassium: 3.9 mmol/L (ref 3.5–5.1)
Sodium: 137 mmol/L (ref 135–145)
Total Bilirubin: 0.6 mg/dL (ref 0.3–1.2)
Total Protein: 6.2 g/dL — ABNORMAL LOW (ref 6.5–8.1)

## 2020-10-19 LAB — HEMOGLOBIN A1C
Hgb A1c MFr Bld: 5.9 % — ABNORMAL HIGH (ref 4.8–5.6)
Mean Plasma Glucose: 122.63 mg/dL

## 2020-10-19 LAB — PROTIME-INR
INR: 1.1 (ref 0.8–1.2)
Prothrombin Time: 13.9 seconds (ref 11.4–15.2)

## 2020-10-19 LAB — LIPID PANEL
Cholesterol: 203 mg/dL — ABNORMAL HIGH (ref 0–200)
HDL: 62 mg/dL (ref >40)
LDL Cholesterol: 131 mg/dL — ABNORMAL HIGH (ref 0–99)
Total CHOL/HDL Ratio: 3.3 RATIO
Triglycerides: 49 mg/dL (ref <150)
VLDL: 10 mg/dL (ref 0–40)

## 2020-10-19 LAB — TSH: TSH: 2.703 u[IU]/mL (ref 0.350–4.500)

## 2020-10-19 LAB — APTT: aPTT: 38 seconds — ABNORMAL HIGH (ref 24–36)

## 2020-10-19 IMAGING — CT CT ANGIO HEAD
2 of 7 series · 8 of 33 positions shown · IV contrast (OMNI 350)
Comparison: None.

CLINICAL DATA: Right eye vision loss.

EXAM:
CT ANGIOGRAPHY HEAD AND NECK
TECHNIQUE: Multidetector CT imaging of the head and neck was performed using
the standard protocol during bolus administration of intravenous
contrast. Multiplanar CT image reconstructions and MIPs were
obtained to evaluate the vascular anatomy. Carotid stenosis
measurements (when applicable) are obtained utilizing NASCET
criteria, using the distal internal carotid diameter as the
denominator.
CONTRAST:  75mL OMNIPAQUE IOHEXOL 350 MG/ML SOLN

[Series 5: cta neck · axial · 0.65mm/px · z∈[-239,-121]mm · 2 of 179 slices shown]
[im 60/179  soft-tissue]
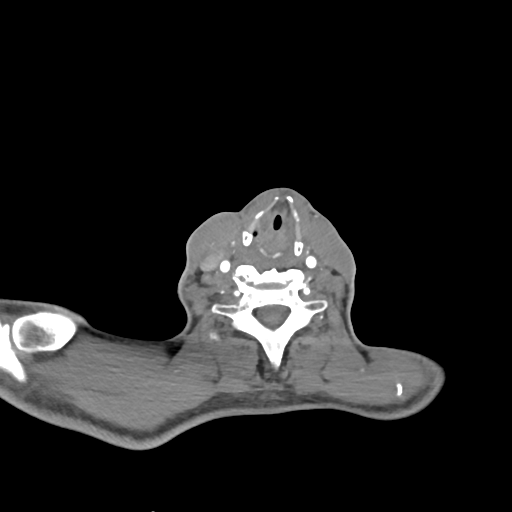
[im 119/179  soft-tissue]
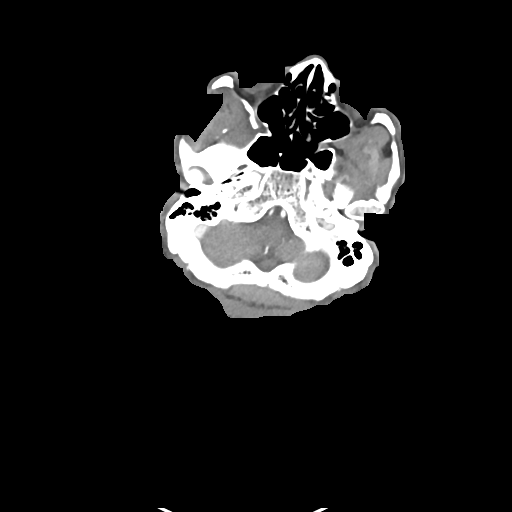

[Series 7: cta neck axial · axial · 0.39mm/px · z∈[-306,-51]mm · 6 of 359 slices shown]
[im 52/359  soft-tissue]
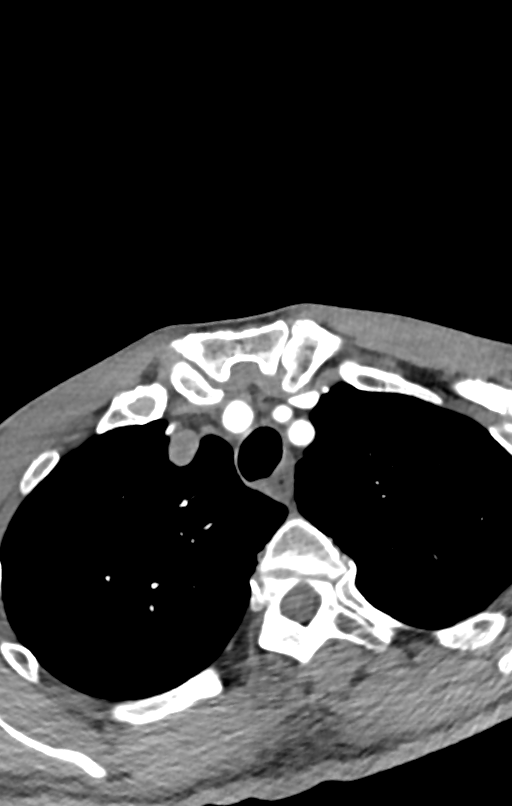
[im 103/359  bone]
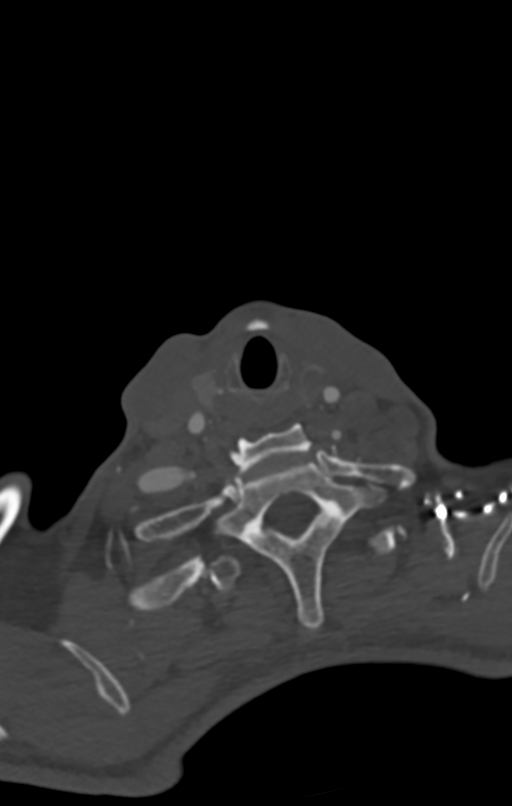
[im 154/359  soft-tissue]
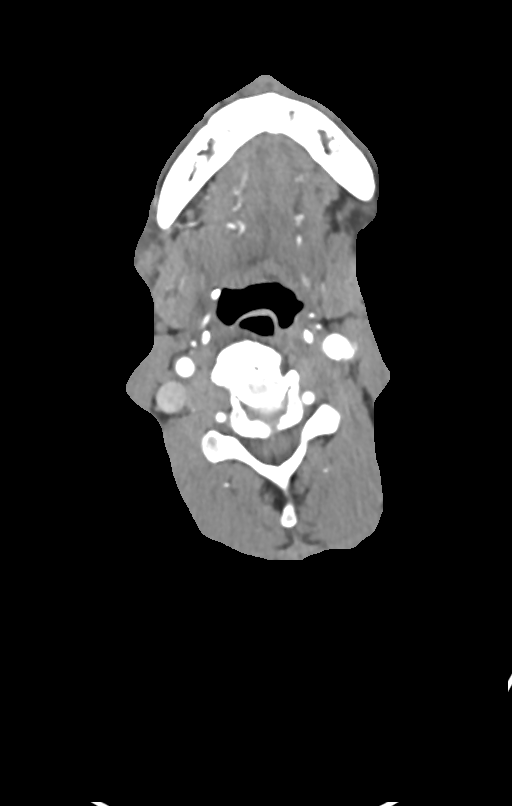
[im 205/359  bone]
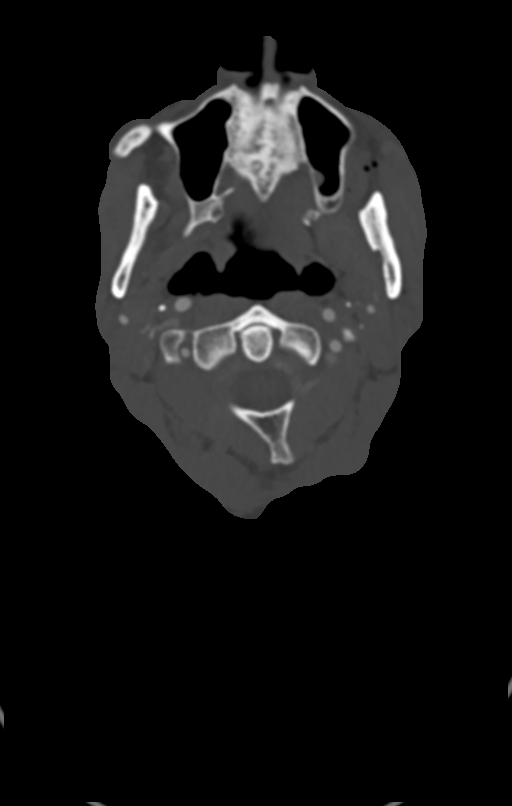
[im 256/359  soft-tissue]
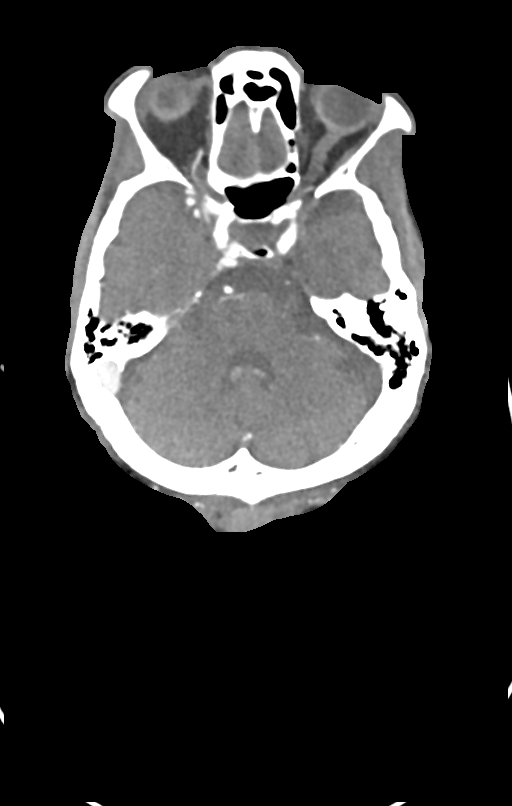
[im 307/359  bone]
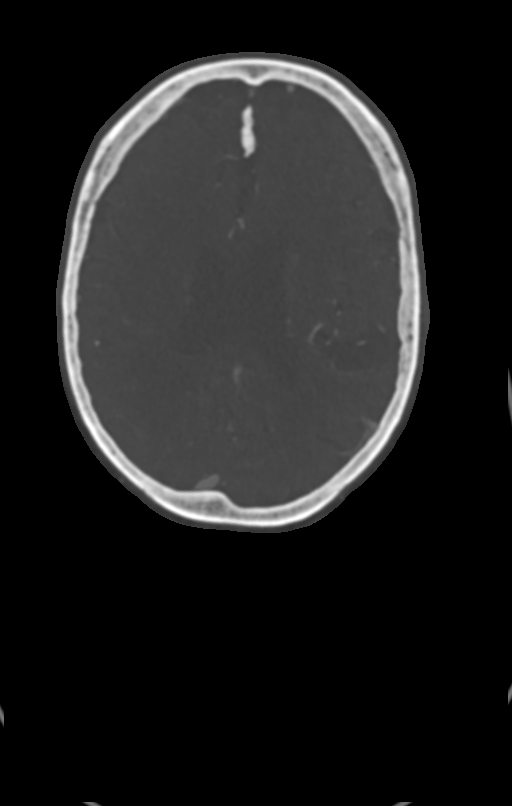

[8 of 33 positions shown; findings below may reference images not displayed]

FINDINGS: CTA NECK FINDINGS

SKELETON: There is no bony spinal canal stenosis. No lytic or
blastic lesion.

OTHER NECK: Normal pharynx, larynx and major salivary glands. No
cervical lymphadenopathy. Unremarkable thyroid gland.

UPPER CHEST: Emphysema

AORTIC ARCH:

There is calcific atherosclerosis of the aortic arch. There is no
aneurysm, dissection or hemodynamically significant stenosis of the
visualized portion of the aorta. Conventional 3 vessel aortic
branching pattern. The visualized proximal subclavian arteries are
widely patent.

RIGHT CAROTID SYSTEM: No dissection, occlusion or aneurysm. Mild
atherosclerotic calcification at the carotid bifurcation without
hemodynamically significant stenosis.

LEFT CAROTID SYSTEM: No dissection, occlusion or aneurysm. Mild
atherosclerotic calcification at the carotid bifurcation without
hemodynamically significant stenosis.

VERTEBRAL ARTERIES: Left dominant configuration. Both origins are
clearly patent. There is no dissection, occlusion or flow-limiting
stenosis to the skull base (V1-V3 segments).

CTA HEAD FINDINGS

POSTERIOR CIRCULATION:

--Vertebral arteries: Normal V4 segments.

--Inferior cerebellar arteries: Normal.

--Basilar artery: Normal.

--Superior cerebellar arteries: Normal.

--Posterior cerebral arteries (PCA): Normal.

ANTERIOR CIRCULATION:

--Intracranial internal carotid arteries: Normal.

--Anterior cerebral arteries (ACA): Normal. Both A1 segments are
present. Patent anterior communicating artery (a-comm).

--Middle cerebral arteries (MCA): Normal.

VENOUS SINUSES: As permitted by contrast timing, patent.

ANATOMIC VARIANTS: None

Review of the MIP images confirms the above findings.
IMPRESSION: 1. No emergent large vessel occlusion or hemodynamically significant
stenosis of the head or neck.

Aortic Atherosclerosis ([8E]-[8E]) and Emphysema ([8E]-[8E]).

## 2020-10-19 IMAGING — CT CT HEAD W/O CM
3 series · 14 of 47 positions shown, 16 images · non-contrast
Comparison: None.

CLINICAL DATA: Loss of vision in the right eye over the last few
days.

EXAM:
CT HEAD WITHOUT CONTRAST
TECHNIQUE: Contiguous axial images were obtained from the base of the skull
through the vertex without intravenous contrast.

[Series 3: head 5.0 h30s · axial · 0.43mm/px · z∈[-86,+49]mm · 8 of 33 slices shown, 10 images]
[im 3/33  brain]
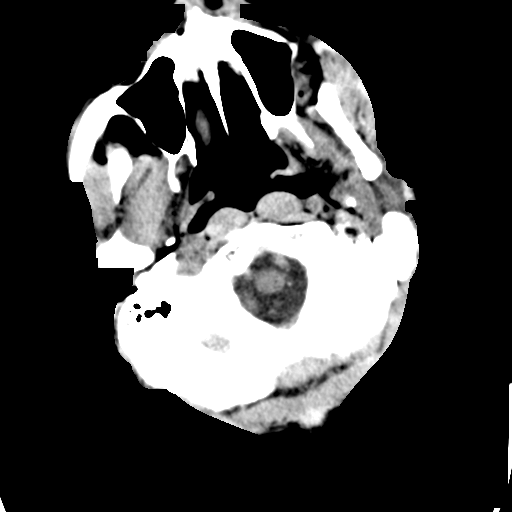
[im 3/33  bone]
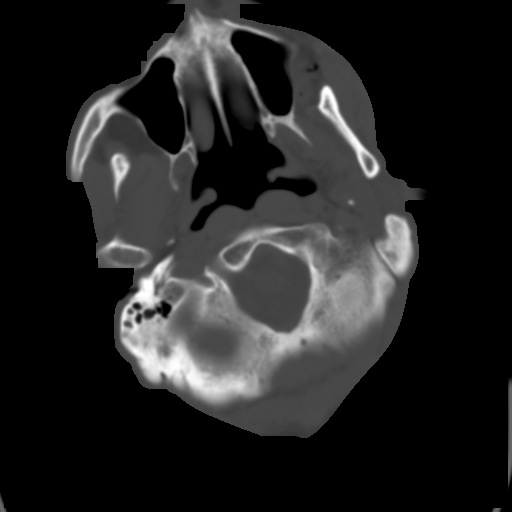
[im 7/33  brain]
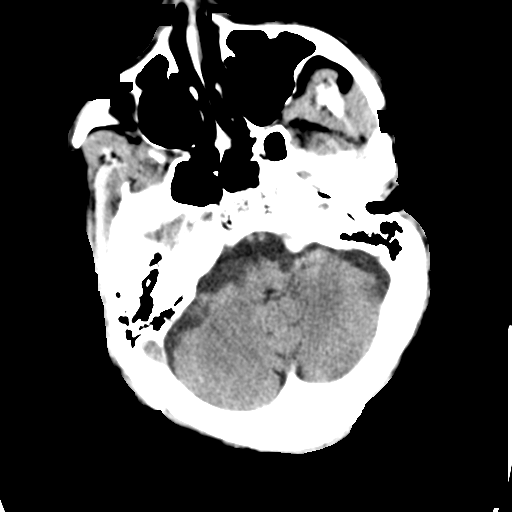
[im 10/33  brain]
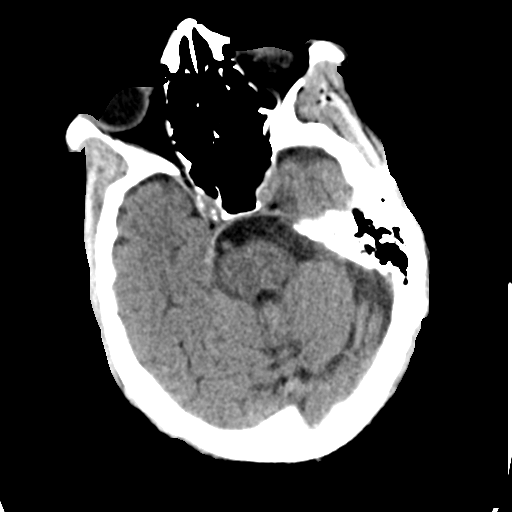
[im 15/33  brain]
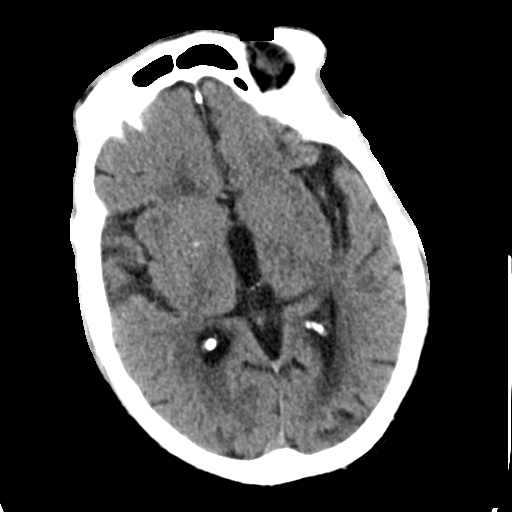
[im 18/33  brain]
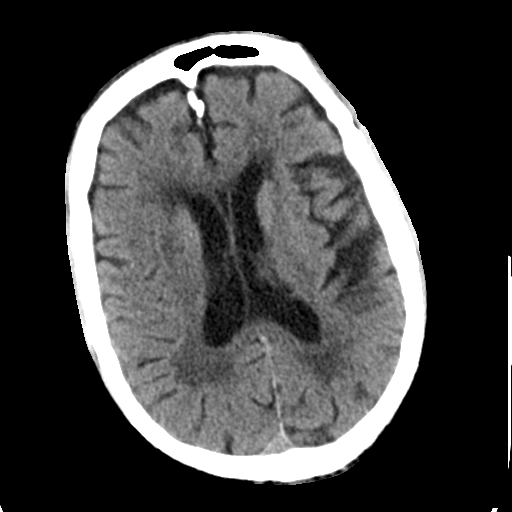
[im 18/33  bone]
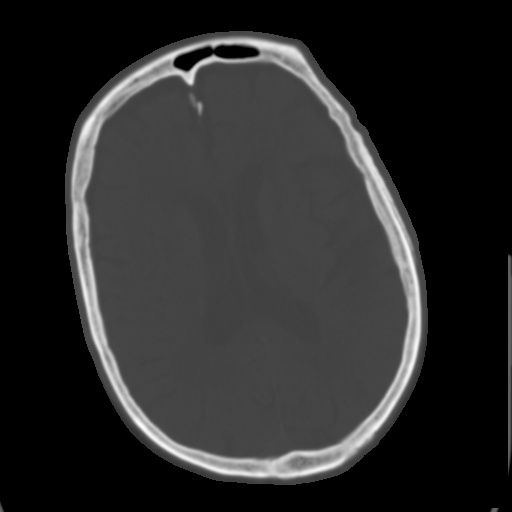
[im 23/33  brain]
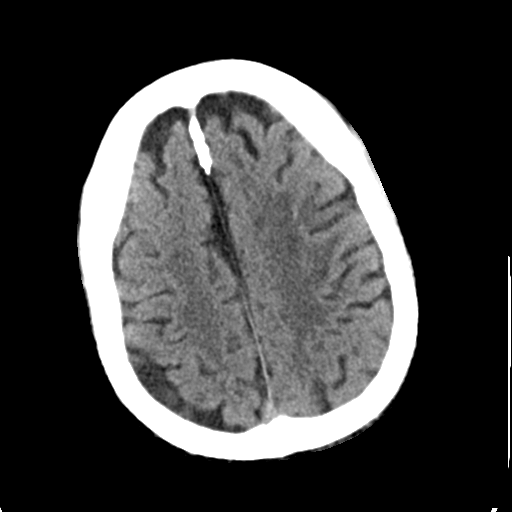
[im 26/33  brain]
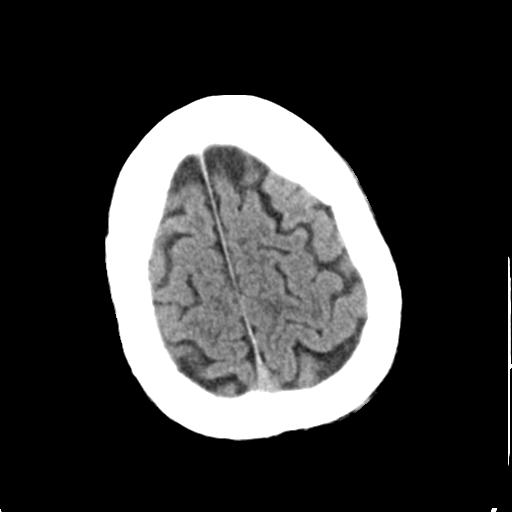
[im 30/33  brain]
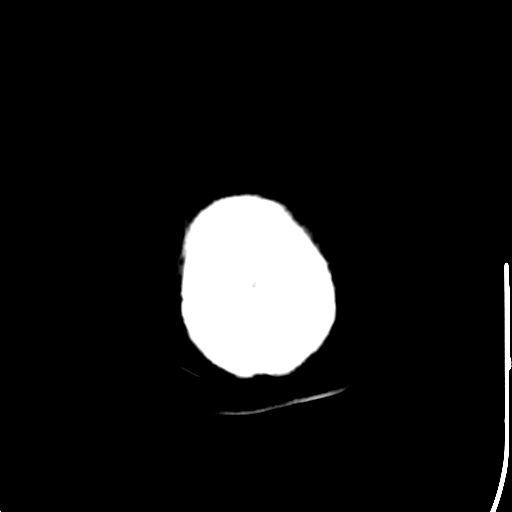

[Series 5: head 3.0 mpr cor · coronal · 0.30mm/px · 3 of 78 slices shown]
[im 26/78  brain]
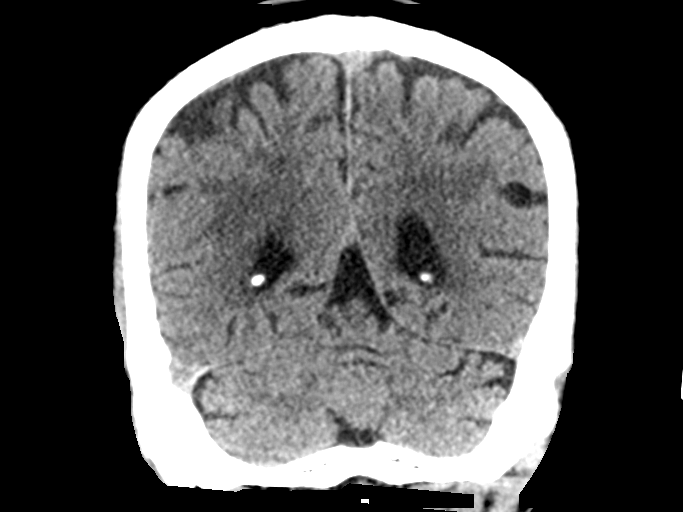
[im 35/78  brain]
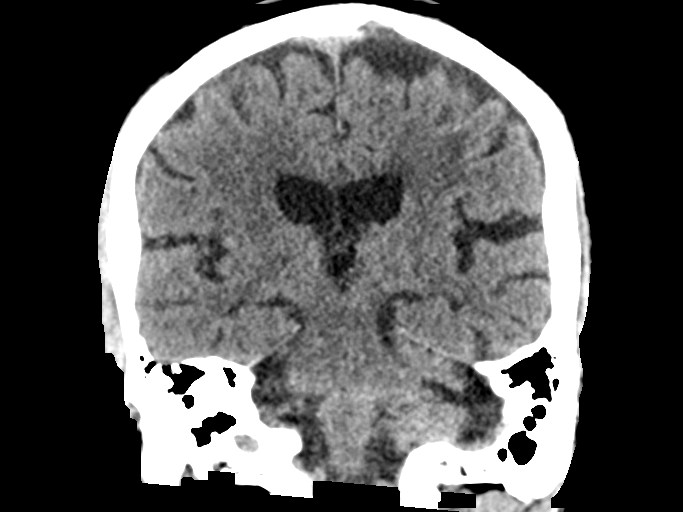
[im 43/78  brain]
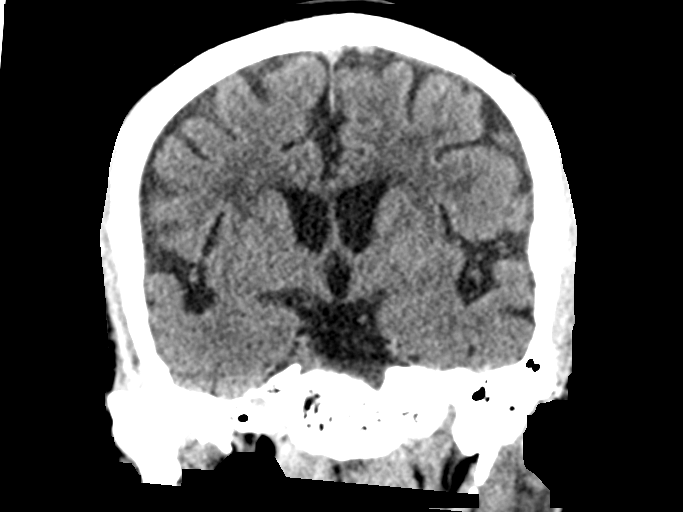

[Series 6: head 3.0 mpr sag · sagittal · 0.30mm/px · 3 of 67 slices shown]
[im 23/67  brain]
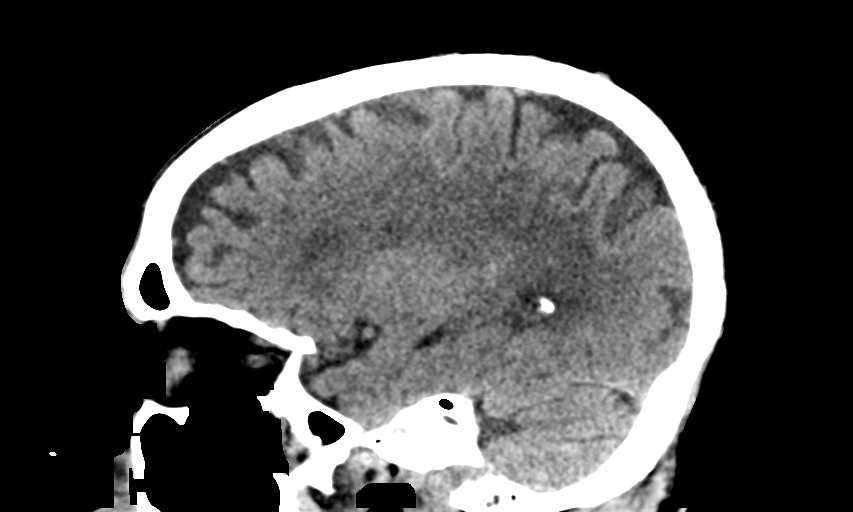
[im 34/67  brain]
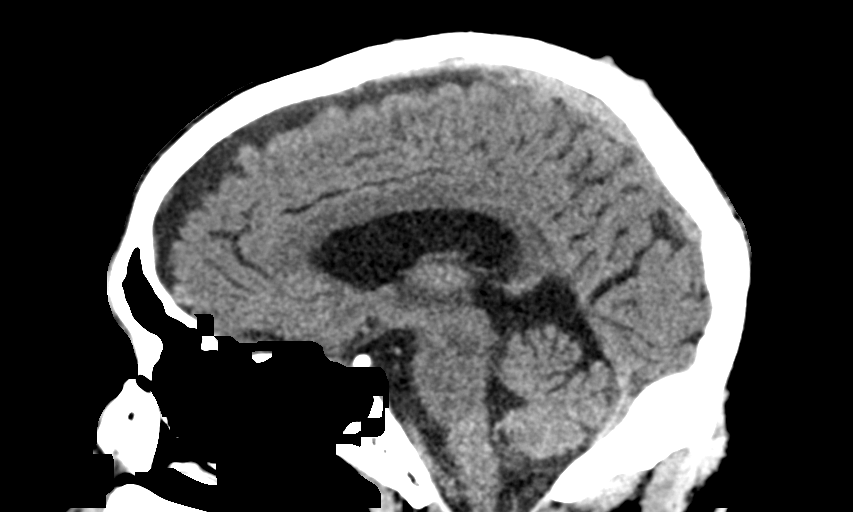
[im 45/67  brain]
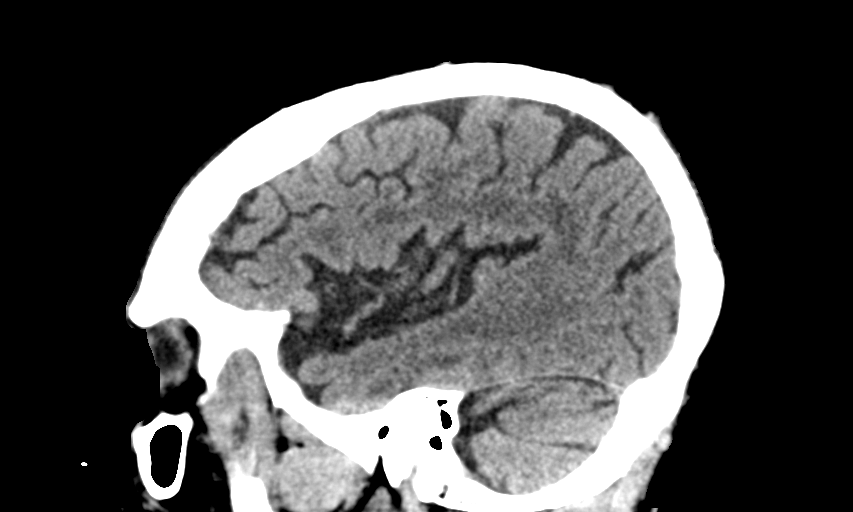

[14 of 47 positions shown; findings below may reference images not displayed]

FINDINGS: Brain: There is mild cerebral atrophy with widening of the
extra-axial spaces and ventricular dilatation.
There are areas of decreased attenuation within the white matter
tracts of the supratentorial brain, consistent with microvascular
disease changes.

Vascular: No hyperdense vessel or unexpected calcification.

Skull: Normal. Negative for fracture or focal lesion.

Sinuses/Orbits: No acute finding.

Other: None.
IMPRESSION: 1. Generalized cerebral atrophy without evidence of an acute
intracranial abnormality.

## 2020-10-19 IMAGING — CT CT ANGIO NECK
2 of 7 series · 8 of 33 positions shown · IV contrast (omnipaque)
Comparison: None.

CLINICAL DATA: Right eye vision loss.

EXAM:
CT ANGIOGRAPHY HEAD AND NECK
TECHNIQUE: Multidetector CT imaging of the head and neck was performed using
the standard protocol during bolus administration of intravenous
contrast. Multiplanar CT image reconstructions and MIPs were
obtained to evaluate the vascular anatomy. Carotid stenosis
measurements (when applicable) are obtained utilizing NASCET
criteria, using the distal internal carotid diameter as the
denominator.
CONTRAST:  75mL OMNIPAQUE IOHEXOL 350 MG/ML SOLN

[Series 5: cta neck · axial · 0.65mm/px · z∈[-239,-121]mm · 2 of 179 slices shown]
[im 60/179  soft-tissue]
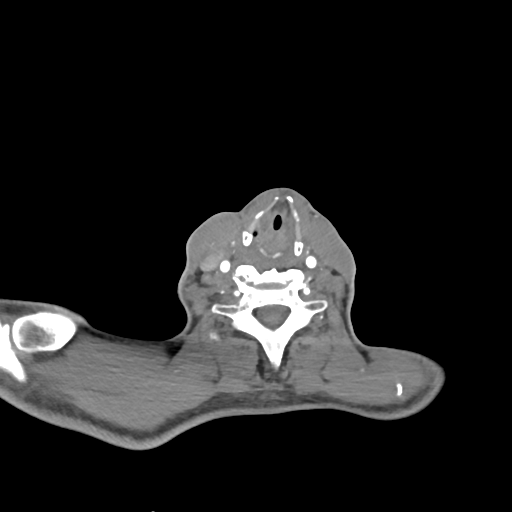
[im 119/179  soft-tissue]
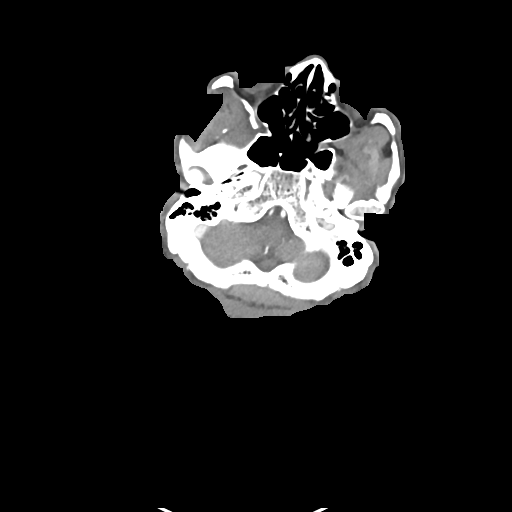

[Series 7: cta neck axial · axial · 0.39mm/px · z∈[-306,-51]mm · 6 of 359 slices shown]
[im 52/359  soft-tissue]
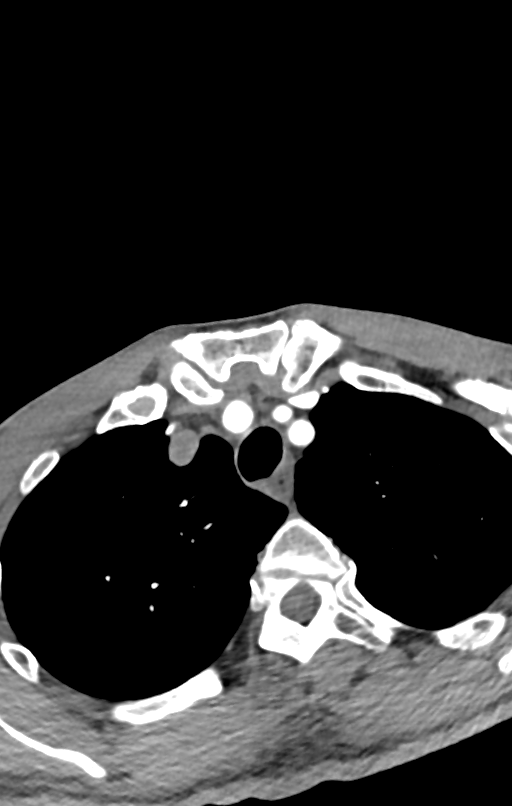
[im 103/359  bone]
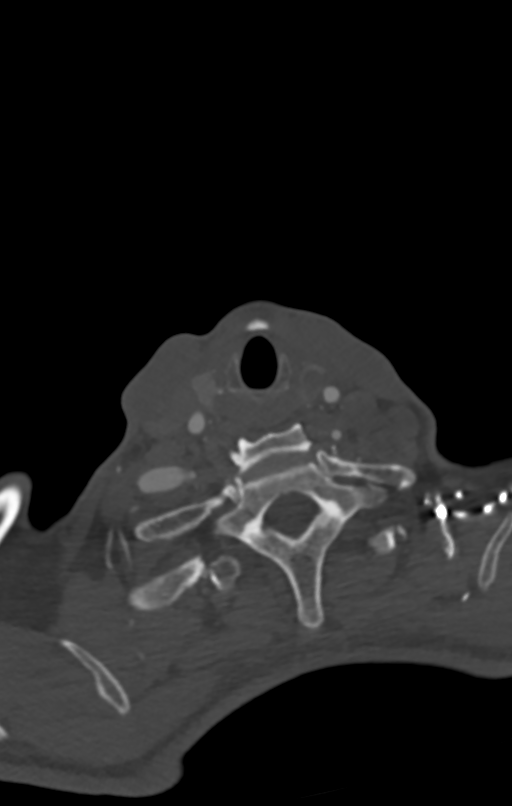
[im 154/359  soft-tissue]
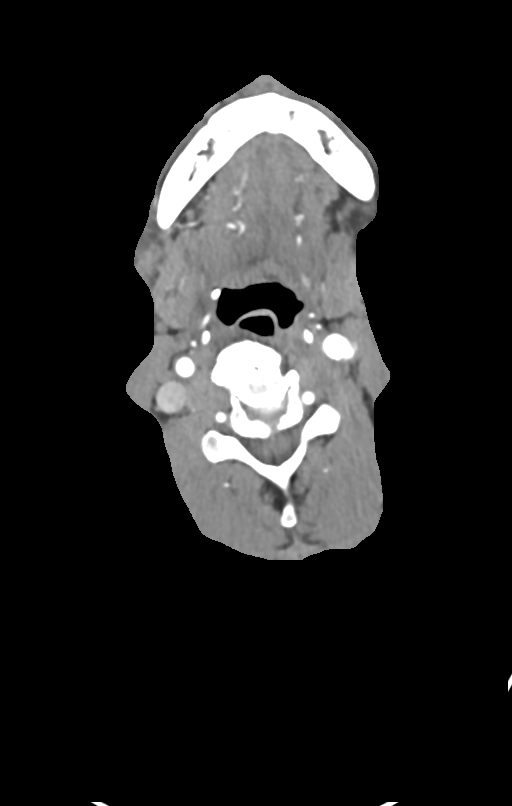
[im 205/359  bone]
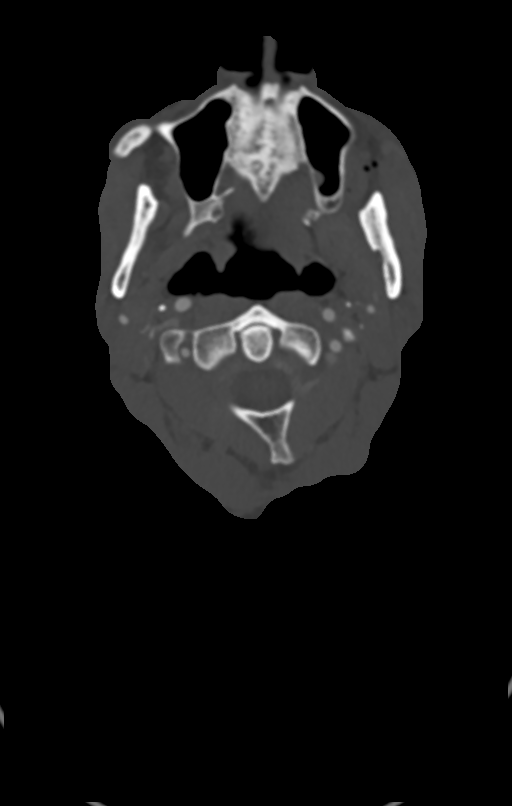
[im 256/359  soft-tissue]
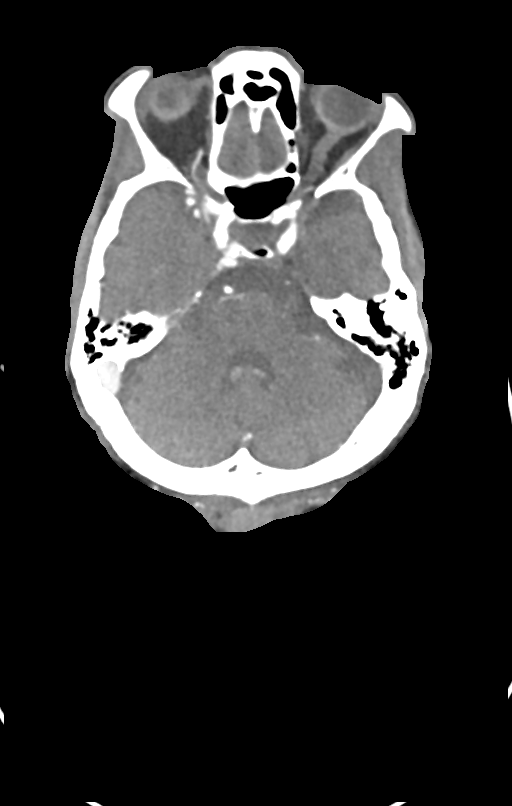
[im 307/359  bone]
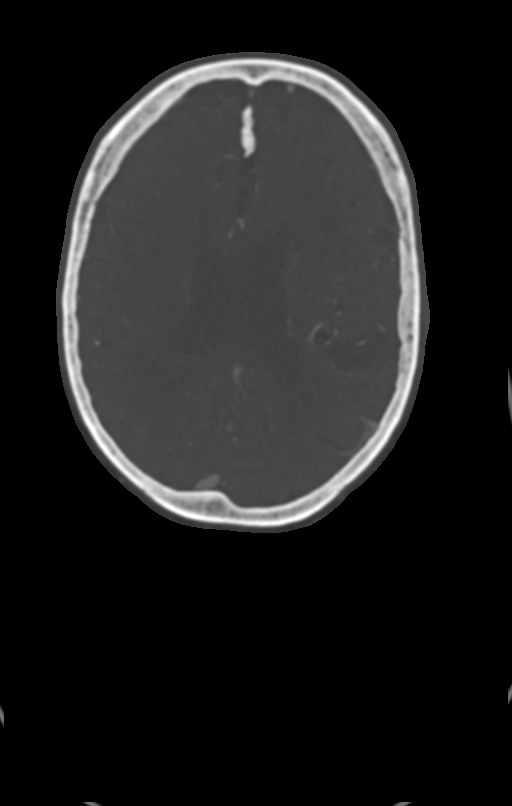

[8 of 33 positions shown; findings below may reference images not displayed]

FINDINGS: CTA NECK FINDINGS

SKELETON: There is no bony spinal canal stenosis. No lytic or
blastic lesion.

OTHER NECK: Normal pharynx, larynx and major salivary glands. No
cervical lymphadenopathy. Unremarkable thyroid gland.

UPPER CHEST: Emphysema

AORTIC ARCH:

There is calcific atherosclerosis of the aortic arch. There is no
aneurysm, dissection or hemodynamically significant stenosis of the
visualized portion of the aorta. Conventional 3 vessel aortic
branching pattern. The visualized proximal subclavian arteries are
widely patent.

RIGHT CAROTID SYSTEM: No dissection, occlusion or aneurysm. Mild
atherosclerotic calcification at the carotid bifurcation without
hemodynamically significant stenosis.

LEFT CAROTID SYSTEM: No dissection, occlusion or aneurysm. Mild
atherosclerotic calcification at the carotid bifurcation without
hemodynamically significant stenosis.

VERTEBRAL ARTERIES: Left dominant configuration. Both origins are
clearly patent. There is no dissection, occlusion or flow-limiting
stenosis to the skull base (V1-V3 segments).

CTA HEAD FINDINGS

POSTERIOR CIRCULATION:

--Vertebral arteries: Normal V4 segments.

--Inferior cerebellar arteries: Normal.

--Basilar artery: Normal.

--Superior cerebellar arteries: Normal.

--Posterior cerebral arteries (PCA): Normal.

ANTERIOR CIRCULATION:

--Intracranial internal carotid arteries: Normal.

--Anterior cerebral arteries (ACA): Normal. Both A1 segments are
present. Patent anterior communicating artery (a-comm).

--Middle cerebral arteries (MCA): Normal.

VENOUS SINUSES: As permitted by contrast timing, patent.

ANATOMIC VARIANTS: None

Review of the MIP images confirms the above findings.
IMPRESSION: 1. No emergent large vessel occlusion or hemodynamically significant
stenosis of the head or neck.

Aortic Atherosclerosis ([8E]-[8E]) and Emphysema ([8E]-[8E]).

## 2020-10-19 IMAGING — MR MR HEAD W/O CM
10 of 11 series · 43 of 48 positions shown · non-contrast
Comparison: Same day CT head.

CLINICAL DATA: Neuro deficit, acute stroke suspected.

EXAM:
MRI HEAD WITHOUT CONTRAST
TECHNIQUE: Multiplanar, multiecho pulse sequences of the brain and surrounding
structures were obtained without intravenous contrast.

[Series 5: DWI · axial · 3.0mm · 0.88mm/px · z∈[-51,+94]mm · 10 of 100 slices shown (1 of 4)]
[im 1/100]
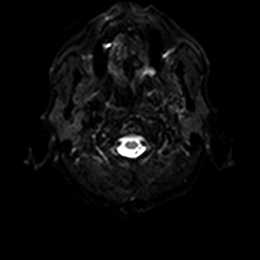
[im 12/100]
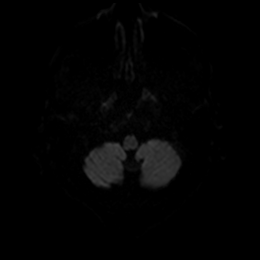
[im 23/100]
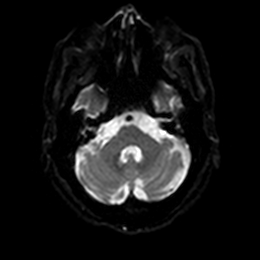
[im 34/100]
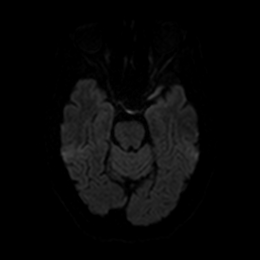
[im 45/100]
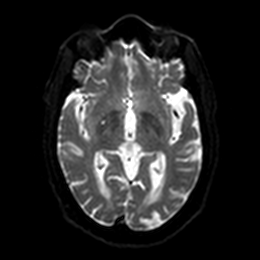
[im 56/100]
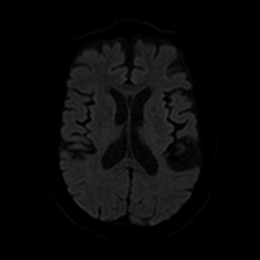
[im 67/100]
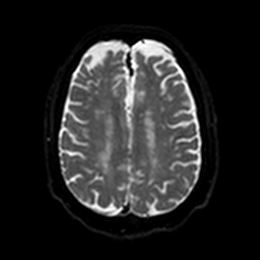
[im 78/100]
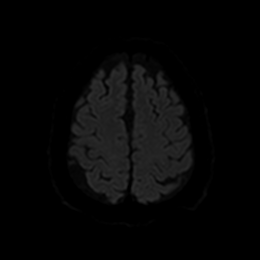
[im 89/100]
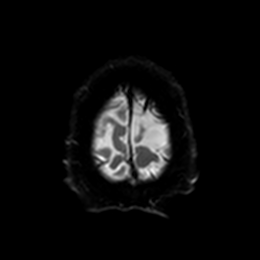
[im 100/100]
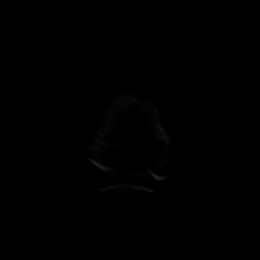

[Series 6: DWI · axial · 3.0mm · 0.88mm/px · z∈[-51,+94]mm · 5 of 48 slices shown (2 of 4)]
[im 1/48]
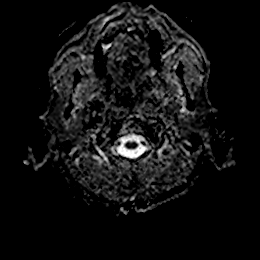
[im 12/48]
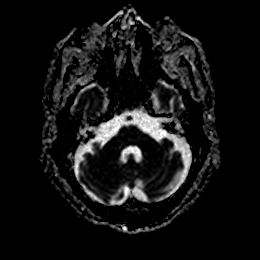
[im 24/48]
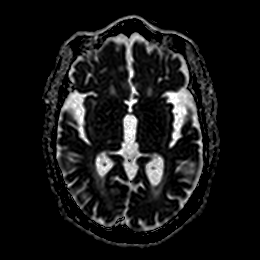
[im 36/48]
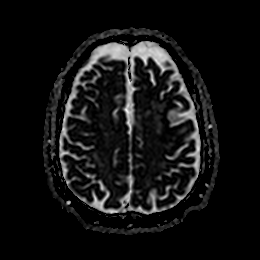
[im 48/48]
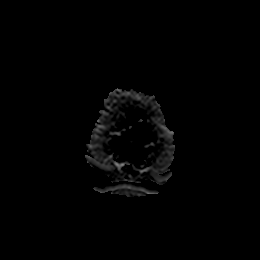

[Series 7: DWI · coronal · 4.0mm · 0.88mm/px · 6 of 70 slices shown (3 of 4)]
[im 1/70]
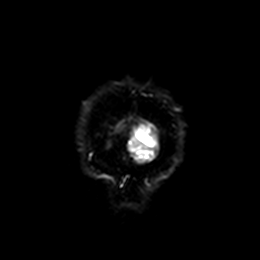
[im 14/70]
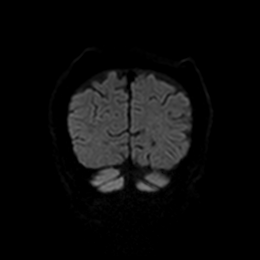
[im 28/70]
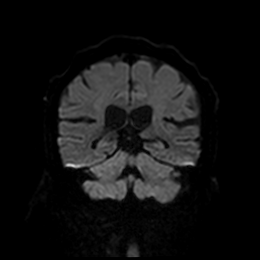
[im 42/70]
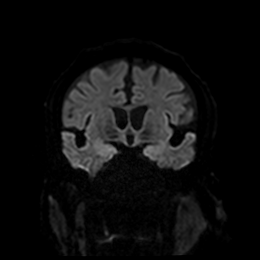
[im 56/70]
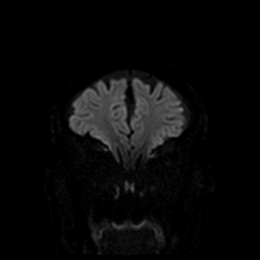
[im 70/70]
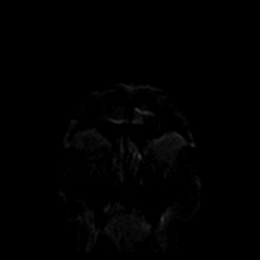

[Series 8: DWI · coronal · 4.0mm · 0.88mm/px · 3 of 35 slices shown (4 of 4)]
[im 1/35]
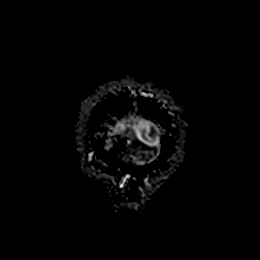
[im 18/35]
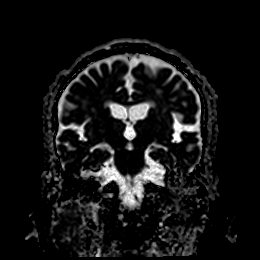
[im 35/35]
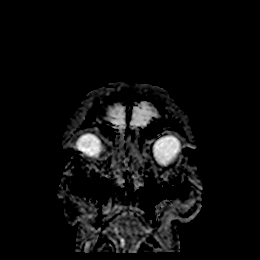

[Series 9: T1 · sagittal · 5.0mm · 0.78mm/px · 2 of 23 slices shown]
[im 1/23]
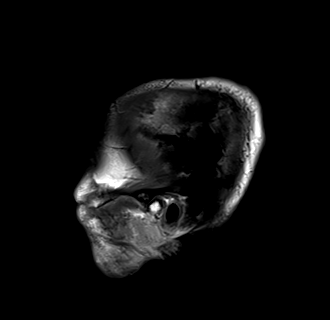
[im 23/23]
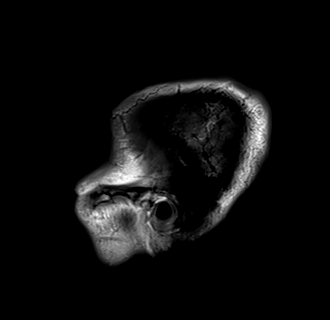

[Series 10: T2 · axial · 5.0mm · 0.72mm/px · z∈[-50,+93]mm · 2 of 25 slices shown (1 of 2)]
[im 1/25]
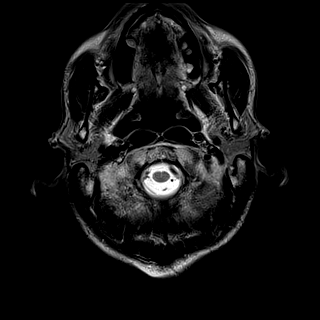
[im 25/25]
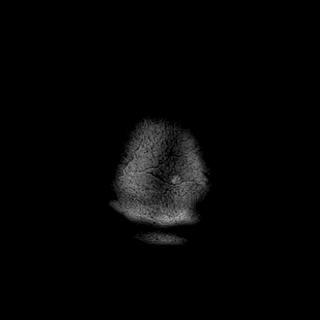

[Series 11: FLAIR · axial · 5.0mm · 0.45mm/px · z∈[-47,+96]mm · 2 of 25 slices shown]
[im 1/25]
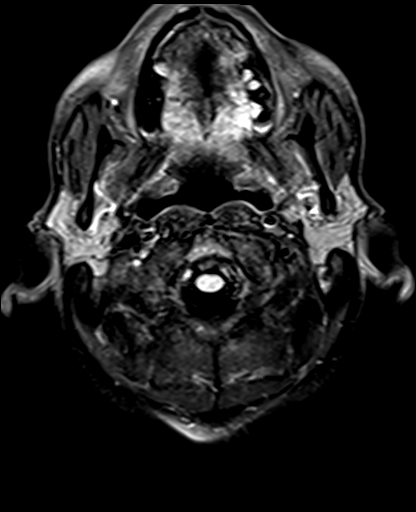
[im 25/25]
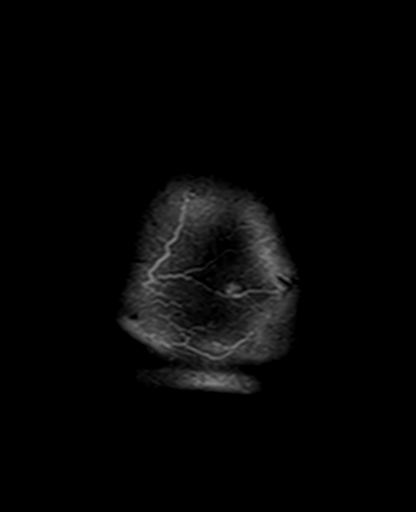

[Series 13: pha_images · axial · 3.0mm · 0.90mm/px · z∈[-63,+109]mm · 5 of 58 slices shown]
[im 1/58]
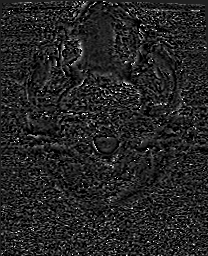
[im 15/58]
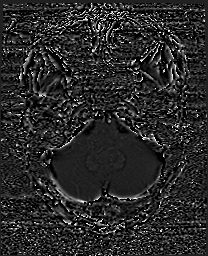
[im 29/58]
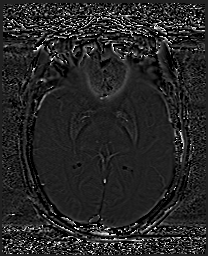
[im 43/58]
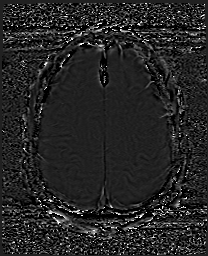
[im 58/58]
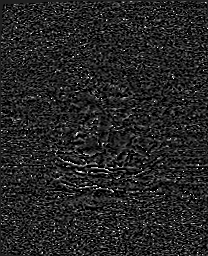

[Series 14: swi_images · axial · 3.0mm · 0.90mm/px · z∈[-63,+112]mm · 5 of 60 slices shown]
[im 1/60]
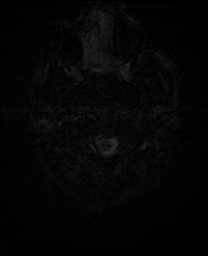
[im 15/60]
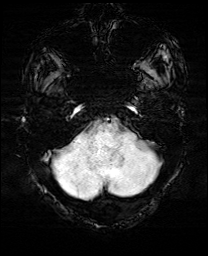
[im 30/60]
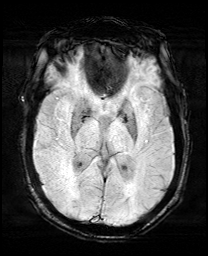
[im 45/60]
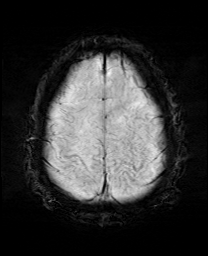
[im 60/60]
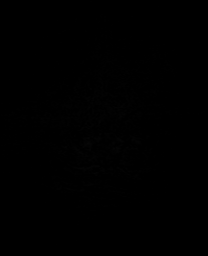

[Series 17: T2 · coronal · 5.0mm · 0.34mm/px · 3 of 29 slices shown (2 of 2)]
[im 1/29]
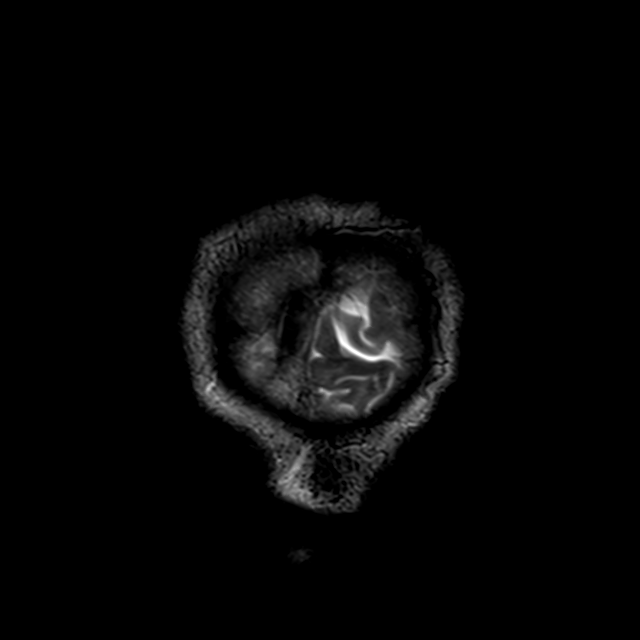
[im 15/29]
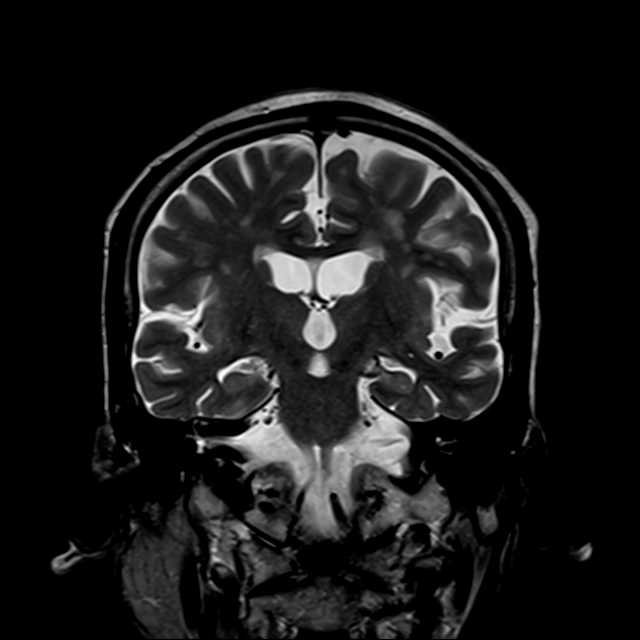
[im 29/29]
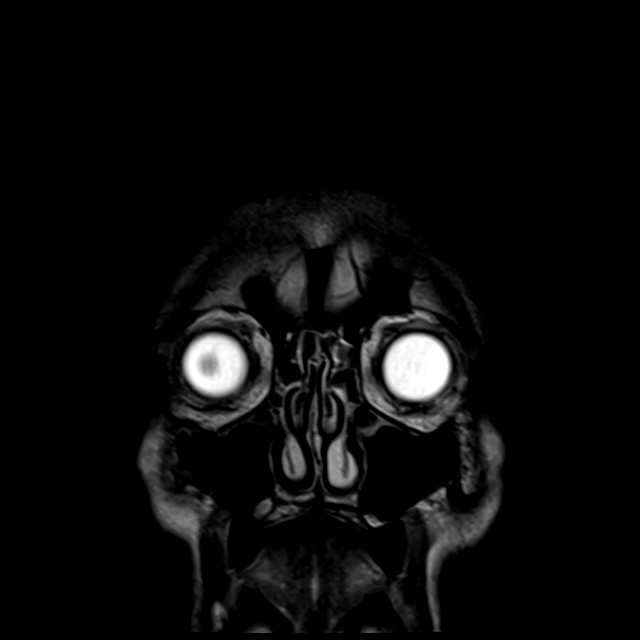

[43 of 48 positions shown; findings below may reference images not displayed]

FINDINGS: Brain: No acute infarction, hemorrhage, hydrocephalus, extra-axial
collection or mass lesion. Moderate scattered T2/FLAIR
hyperintensities within the white matter, most likely related to
chronic microvascular ischemic disease. Cavum septum pellucidum et
vergae, anatomic variant.

Vascular: Major arterial flow voids are maintained at the skull
base.

Skull and upper cervical spine: Normal marrow signal.

Sinuses/Orbits: Sinuses are largely clear.

Other: No mastoid effusions.
IMPRESSION: 1. No evidence of acute intracranial abnormality.  No acute infarct.
2. Moderate chronic microvascular ischemic disease.

## 2020-10-19 MED ORDER — STROKE: EARLY STAGES OF RECOVERY BOOK
Freq: Once | Status: AC
  Filled 2020-10-19: qty 1

## 2020-10-19 MED ORDER — NICOTINE 14 MG/24HR TD PT24
14.0000 mg | MEDICATED_PATCH | Freq: Every day | TRANSDERMAL | Status: DC
  Administered 2020-10-19 – 2020-10-21 (×3): 14 mg via TRANSDERMAL
  Filled 2020-10-19 (×3): qty 1

## 2020-10-19 MED ORDER — ACETAMINOPHEN 160 MG/5ML PO SOLN: 650.0000 mg | ORAL | Status: DC | PRN

## 2020-10-19 MED ORDER — ACETAMINOPHEN 650 MG RE SUPP: 650.0000 mg | RECTAL | Status: DC | PRN

## 2020-10-19 MED ORDER — ACETAMINOPHEN 325 MG PO TABS: 650.0000 mg | ORAL_TABLET | ORAL | Status: DC | PRN

## 2020-10-19 MED ORDER — SODIUM CHLORIDE 0.9% FLUSH
3.0000 mL | Freq: Once | INTRAVENOUS | Status: AC
  Administered 2020-10-19: 3 mL via INTRAVENOUS

## 2020-10-19 MED ORDER — IOHEXOL 350 MG/ML SOLN
75.0000 mL | Freq: Once | INTRAVENOUS | Status: AC | PRN
  Administered 2020-10-19: 75 mL via INTRAVENOUS

## 2020-10-19 MED ORDER — SENNOSIDES-DOCUSATE SODIUM 8.6-50 MG PO TABS: 1.0000 | ORAL_TABLET | Freq: Every evening | ORAL | Status: DC | PRN

## 2020-10-19 NOTE — Hospital Course (Addendum)
Albert Love is a 80 y.o. male who presented with acute right vision loss 2/2 central retinal artery occlusion. PMH is significant for polysubstance abuse, perforated duodenal ulcer s/p ex-lap, H. Pylori s/p treatment. Below is his hospital course.   Right Central Retinal Artery Occlusion HTN in ED to 676'H systolic. Neurology consulted in ED. Risk stratification labs significant for Hgb A1c of 5.9, lipid panel notable for cholesterol 203, LDL 131, TSH WNL. CT Head with generalized atrophy but without acute abnormalities. MRI Brain with moderate chronic microvascular ischemic disease, CTA head and neck with aortic atherosclerosis but no emergent large vessel occlusion or significant stenosis of head or neck. Echo demonstrated LV EF 60-65% with no LV regional wall motion abnormalities, Grade II diastolic dysfunction. NCC and LCC were moderately thickened and calcified. Per echo read, endocarditis could not be excluded. Curbsided case with cardiology who felt that TEE did not need to be pursued given patient did not clinically fit with endocarditis (no fever, normal WBC count). Echo personally reviewed by cardiologist as well who felt it could have been consistent with an old valve in a patient his age. Neurology recommended 30 day cardiac monitoring outpatient. Patient was started on atorvastatin 40 mg, increased to 80 mg qd. Dual antiplatelet therapy with aspirin 325 mg loading (then 81 mg daily) and Plavix 75 mg x 3 weeks, then recommendation to only use aspirin afterwards.   Hypertension Persistently hypertensive up to 209O systolic. Permissive HTN was allowed. Started on Amlodipine 5 mg daily on 3/8.   Hx H. Pylori and Perforated Gastric Ulcer s/p Omental [name] Patch (09/05/19) H. Pylori stool antigen collected. Results not back at time of discharge. Patient was started on Protonix at discharge.  Polysubstance Abuse  Tobacco Abuse Polysubstance abuse with cocaine and THC. Social worker consulted  during inpatient hospitalization for substance abuse resources, PCP, transportation and medication assistance.   Issues for Follow-Up Recommend colonoscopy for colon cancer screening, patient has not had one.  Continue to encourage tobacco and substance cessation   Recheck BP; Started on Amplodipine 5mg . Consider adjusting medication as necessary.  Follow up H. Pylori stool antigen, patient will  need treatment for H. Pylori if positive. If negative, will still need EGD/colonoscopy with GI for confirmation

## 2020-10-19 NOTE — ED Provider Notes (Signed)
[facility] EMERGENCY DEPARTMENT Provider Note   CSN: [id number] Arrival date & time: 10/19/20  1301     History No chief complaint on file.   Jamarien Rodkey is a 80 y.o. male.  Patient is a 80 year old male who presents with concerns for right sided vision loss. Patient reports that his last known normal was on Saturday evening just before going to bed. He states that he started having intermittent blurred vision in the right eye while watching tv at that time. He went to bed after that, and woke up on Sunday without any vision in his right eye. He states that he can see blurred edges of objects and light. He cannot see numbers and letters. His left eye has normal vision. He has no other concerns and no other reports of weakness and other abnormalities. He denies taking any medications. He has not had any trouble with hearing or with swallowing. He denies facial weakness. He went to his eye doctor today for evaluation, and the ophthalmologist sent him here for further evaluation.   Ophthalmologist contacted. 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 Sent here for further stroke workup.        Past Medical History:  Diagnosis Date  . Acute renal insufficiency 09/05/2019  . Perforated gastric ulcer s/p omental [name] patch 09/05/2019 09/05/2019    Patient Active Problem List   Diagnosis Date Noted  . Pneumoperitoneum 09/05/2019  . Acute renal insufficiency 09/05/2019  . Perforated gastric ulcer s/p omental [name] patch 09/05/2019 09/05/2019  . Coagulopathy ([place]) 09/05/2019    Past Surgical History:  Procedure Laterality Date  . BOWEL RESECTION N/A 09/05/2019   Procedure: SMALL BOWEL RESECTION;  Surgeon: Clovis Riley, MD;  Location: [facility];  Service: General;  Laterality: N/A;  . IR SINUS/FIST TUBE CHK-NON GI  09/19/2019  . IR SINUS/FIST TUBE CHK-NON GI  09/19/2019  . LAPAROTOMY N/A 09/05/2019   Procedure: EXPLORATORY LAPAROTOMY WITH [facility] AND GASTRIC  BIOPSY;  Surgeon: Clovis Riley, MD;  Location: [facility];  Service: General;  Laterality: N/A;       Family History  Problem Relation Age of Onset  . Colon cancer Neg Hx   . Pancreatic cancer Neg Hx   . Esophageal cancer Neg Hx     Social History   Tobacco Use  . Smoking status: Current Every Day Smoker    Packs/day: 0.50    Types: Cigarettes  . Smokeless tobacco: Never Used  Substance Use Topics  . Alcohol use: Yes    Comment: occasionally     Home Medications Prior to Admission medications   Not on File    Allergies    Patient has no known allergies.  Review of Systems   Review of Systems  Constitutional: Negative for chills and fever.  HENT: Negative for ear pain and sore throat.   Eyes: Positive for visual disturbance. Negative for pain.  Respiratory: Negative for cough and shortness of breath.   Cardiovascular: Negative for chest pain and palpitations.  Gastrointestinal: Negative for abdominal pain and vomiting.  Genitourinary: Negative for dysuria and hematuria.  Musculoskeletal: Negative for arthralgias and back pain.  Skin: Negative for color change and rash.  Neurological: Negative for seizures and syncope.  All other systems reviewed and are negative.   Physical Exam Updated Vital Signs There were no vitals taken for this visit.  Physical Exam Vitals and nursing note reviewed.  Constitutional:      General: He is not in  acute distress.    Appearance: He is well-developed and well-nourished.  HENT:     Head: Normocephalic and atraumatic.     Right Ear: External ear normal.     Left Ear: External ear normal.     Nose: Nose normal.  Eyes:     General:        Right eye: No discharge.        Left eye: No discharge.     Extraocular Movements:     Right eye: Normal extraocular motion.     Left eye: Normal extraocular motion.     Conjunctiva/sclera: Conjunctivae normal.  Cardiovascular:     Rate and Rhythm: Normal rate and regular rhythm.      Heart sounds: No murmur heard.   Pulmonary:     Effort: Pulmonary effort is normal. No respiratory distress.     Breath sounds: Normal breath sounds.  Abdominal:     Palpations: Abdomen is soft.     Tenderness: There is no abdominal tenderness.  Musculoskeletal:        General: No edema.     Cervical back: Neck supple.  Skin:    General: Skin is warm and dry.  Neurological:     Mental Status: He is alert.     Cranial Nerves: No dysarthria or facial asymmetry.     Sensory: Sensation is intact.     Motor: Motor function is intact.     Coordination: Finger-Nose-Finger Test and Heel to [employer] normal.     Comments: Visual acuity unable to be assessed in right eye Left eye 20/50 Reports improved vision lateral and peripherally - however still not normal   Psychiatric:        Mood and Affect: Mood and affect normal.     ED Results / Procedures / Treatments   Labs (all labs ordered are listed, but only abnormal results are displayed) Labs Reviewed  APTT - Abnormal; Notable for the following components:      Result Value   aPTT 38 (*)    All other components within normal limits  CBC - Abnormal; Notable for the following components:   Hemoglobin 11.1 (*)    HCT 36.7 (*)    MCV 76.6 (*)    MCH 23.2 (*)    RDW 16.5 (*)    All other components within normal limits  COMPREHENSIVE METABOLIC PANEL - Abnormal; Notable for the following components:   Total Protein 6.2 (*)    Albumin 3.1 (*)    AST 13 (*)    All other components within normal limits  PROTIME-INR  DIFFERENTIAL  CBG MONITORING, ED    EKG None  Radiology EXAM: CT HEAD WITHOUT CONTRAST  TECHNIQUE: Contiguous axial images were obtained from the base of the skull through the vertex without intravenous contrast.  COMPARISON:  None.  FINDINGS: Brain: There is mild cerebral atrophy with widening of the extra-axial spaces and ventricular dilatation. There are areas of decreased attenuation within the  white matter tracts of the supratentorial brain, consistent with microvascular disease changes.  Vascular: No hyperdense vessel or unexpected calcification.  Skull: Normal. Negative for fracture or focal lesion.  Sinuses/Orbits: No acute finding.  Other: None.  IMPRESSION: 1. Generalized cerebral atrophy without evidence of an acute intracranial abnormality.   Procedures Procedures    Medications Ordered in ED Medications  sodium chloride flush (NS) 0.9 % injection 3 mL (has no administration in time range)    ED Course  I have reviewed the  triage vital signs and the nursing notes.  Pertinent labs & imaging results that were available during my care of the patient were reviewed by me and considered in my medical decision making (see chart for details).    MDM Rules/Calculators/A&P                          Patient is a 80 year old male who presents with known CRAO diagnosis. Vital signs are stable on arrival. Patient with decreased vision in the right eye in comparison to the left eye.   20/50 visual acuity in left, NT in right. Physical examination notable for no other significant neurologic deficits. Rest of CN grossly intact. Last known normal outside of tPA or interventional window.  CT head obtained. No acute abnormalities identified.  Given CRAO, patient will need to be admitted for full stroke workup to identify etiology and underlying predispositions.   Neurology consulted. Patient to be admitted to medicine for further evaluation.   Final Clinical Impression(s) / ED Diagnoses Final diagnoses:  None     Kugler, [place], MD 10/20/20 7782    Sherwood Gambler, MD 10/22/20 1901

## 2020-10-19 NOTE — ED Triage Notes (Signed)
Patient sent from ophthalmologist to be evaluated for stroke after losing vision to right eye Saturday night. Patient denies pain, no other neuro deficits. Ambulatory with steady gait

## 2020-10-19 NOTE — Consult Note (Signed)
Neurology Consultation  Reason for Consult: Right eye vision loss Referring Physician: Dr. Morton Amy  CC: Right eye vision loss  History is obtained from: Patient, chart review  HPI: Albert Love is a 80 y.o. male with a medical history significant for perforated gastric ulcer s/p ex-lap who presented to the ED today from his outpatient ophthalmaologist for evaluation of acute right eye vision loss since late Saturday night/Sunday morning. Patient states he was watching television late Saturday night when his vision became blurry in his right eye. He then went to bed sometime after midnight and when he woke Sunday morning, his vision in his right eye was completely impaired. He describes his vision as looking through thick cobwebs in a bright environment. He went to the ophthalmologist today for evaluation and was sent to the ED with concern for CRAO. Neurology was consulted for further evaluation of acute vision loss.   LKW: Saturday evening 3/5 into Sunday morning 3/6 around 00:00 tPA - no - OSW mRS -0  ROS:  ROS was performed and is negative except as noted in the HPI.   Past Medical History:  Diagnosis Date  . Acute renal insufficiency 09/05/2019  . Perforated gastric ulcer s/p omental [name] patch 09/05/2019 09/05/2019   Past Surgical History:  Procedure Laterality Date  . BOWEL RESECTION N/A 09/05/2019   Procedure: SMALL BOWEL RESECTION;  Surgeon: Clovis Riley, MD;  Location: [facility];  Service: General;  Laterality: N/A;  . IR SINUS/FIST TUBE CHK-NON GI  09/19/2019  . IR SINUS/FIST TUBE CHK-NON GI  09/19/2019  . LAPAROTOMY N/A 09/05/2019   Procedure: EXPLORATORY LAPAROTOMY WITH [facility] AND GASTRIC BIOPSY;  Surgeon: Clovis Riley, MD;  Location: [facility];  Service: General;  Laterality: N/A;   Family History  Problem Relation Age of Onset  . Colon cancer Neg Hx   . Pancreatic cancer Neg Hx   . Esophageal cancer Neg Hx    Social History:   reports that he has been smoking  cigarettes. He has been smoking about 0.50 packs per day. He has never used smokeless tobacco. He reports current alcohol use. No history on file for drug use.  Medications No current outpatient medications  Takes Excedrin and ibuprofen as needed for headaches  Exam: Current vital signs: BP (!) 155/93   Pulse 68   Temp 97.8 F (36.6 C) (Oral)   Resp (!) 25   SpO2 100%  Vital signs in last 24 hours: Temp:  [97.8 F (36.6 C)] 97.8 F (36.6 C) (03/07 1524) Pulse Rate:  [62-77] 68 (03/07 1630) Resp:  [17-25] 25 (03/07 1630) BP: (155-174)/(93-95) 155/93 (03/07 1630) SpO2:  [99 %-100 %] 100 % (03/07 1630)  GENERAL: Awake, alert, sitting up in bed interacting with examiner Psych: Affect appropriate for situation, cooperative with examination Head: Normocephalic and atraumatic without obvious deformity EENT: Arcus senilis present bilaterally, no OP obstruction, normal conjunctiva LUNGS - Normal respiratory effort, non-labored breathing CV - Regular rate and rhythm on cardiac monitor  ABDOMEN - Soft, non-tender, non-distended Ext: warm, well perfused. Extremities without obvious abnormality.   NEURO:  Mental Status: Alert and oriented to person, place, age, month, year, and situation. He is able to provide a clear and coherent history of present illness. Speech is clear without dysarthria or aphasia. Naming, repetition, fluency, and comprehension are intact. No signs of neglect are noted.  Cranial Nerves:  II: Pupils 5 mm, equal, and round. Left eye briskly reactive to light, right eye non-reactive to light. Right  afferent pupillary defect noted. Visual fields full in left eye, blink to threat absent in right eye. III, IV, VI: EOMI without ptosis.  V: Sensation is intact to light touch and symmetrical to face.  VII: Face is symmetric resting and smiling. Able to puff cheeks and raise eyebrows.  VIII: Hearing intact to voice IX, X: Palate elevation is symmetric. Phonation normal.  XI:  Normal sternocleidomastoid and trapezius muscle strength XII: Tongue protrudes midline without fasciculations.   Motor: 5/5 strength is all muscle groups.  Tone is normal. Bulk is normal.  Sensation- Intact to light touch bilaterally in all four extremities. Extinction intact. 2 point discrimination.  Coordination: FTN intact bilaterally. HKS intact bilaterally. No pronator drift.  DTRs: 2+ throughout.  Gait- deferred  Labs I have reviewed labs in epic and the results pertinent to this consultation are: CBC    Component Value Date/Time   WBC 7.9 10/19/2020 1350   RBC 4.79 10/19/2020 1350   HGB 11.1 (L) 10/19/2020 1350   HCT 36.7 (L) 10/19/2020 1350   PLT 279 10/19/2020 1350   MCV 76.6 (L) 10/19/2020 1350   MCH 23.2 (L) 10/19/2020 1350   MCHC 30.2 10/19/2020 1350   RDW 16.5 (H) 10/19/2020 1350   LYMPHSABS 1.9 10/19/2020 1350   MONOABS 0.6 10/19/2020 1350   EOSABS 0.2 10/19/2020 1350   BASOSABS 0.0 10/19/2020 1350  CMP     Component Value Date/Time   NA 137 10/19/2020 1350   K 3.9 10/19/2020 1350   CL 104 10/19/2020 1350   CO2 24 10/19/2020 1350   GLUCOSE 96 10/19/2020 1350   BUN 10 10/19/2020 1350   CREATININE 1.08 10/19/2020 1350   CALCIUM 8.9 10/19/2020 1350   PROT 6.2 (L) 10/19/2020 1350   ALBUMIN 3.1 (L) 10/19/2020 1350   AST 13 (L) 10/19/2020 1350   ALT 8 10/19/2020 1350   ALKPHOS 62 10/19/2020 1350   BILITOT 0.6 10/19/2020 1350   GFRNONAA >60 10/19/2020 1350   GFRAA >60 11/02/2019 0026   Imaging I have reviewed the images obtained: CT-scan of the brain: 1. Generalized cerebral atrophy without evidence of an acute intracranial abnormality.  MRI examination of the brain ordered, pending CT angio head and neck pending for vessel imaging  Impression: 80 year old male with history as above who presents from ophthalmologist for evaluation of right eye vision loss/suspected CRAO.   Recommendations: - CT angio head and neck vessel imaging - MRI brain - HgbA1c,  fasting lipid panel. Initiate statin therapy with LDL > 70.  - Frequent neuro checks - Echocardiogram - Prophylactic therapy- Antiplatelet med: Aspirin - dose 325mg  PO or 300mg  PR followed by aspirin 81 mg daily  - Risk factor modification - Telemetry monitoring. If no arrhythmias captured inpatient, consider 30-day event monitor - PT consult, OT consult, Speech consult  Pt seen by NP/Neuro and later by MD. Note/plan to be edited by MD as needed.  Anibal Henderson, AGAC-NP [facility] Pager: [phone]  Attending Neurohospitalist Addendum Patient seen and examined with APP/Resident. Agree with the history and physical as documented above. Agree with the plan as documented, which I helped formulate. I have independently reviewed the chart, obtained history, review of systems and examined the patient.I have personally reviewed pertinent head/neck/spine imaging (CT/MRI).  Outside window for tPA Vascular imaging pending. ASA for now. Will follow  -- Amie Portland, MD Stroke Neurology Pager: [phone]

## 2020-10-19 NOTE — H&P (Addendum)
[facility] Admission History and Physical Service Pager: [phone]  Patient name: Albert Love record number: [id number] Date of birth: 30-Apr-1941 Age: 80 y.o. Gender: male  Primary Care Provider: Patient, No Pcp Per Consultants: Neurology Code Status: Full, does not want prolonged ventilatory support Preferred Emergency Contact: Albert Love [phone], nephew  Chief Complaint: vision loss  Assessment and Plan: Burch Marchuk is a 80 y.o. male presenting with right vision loss. PMH is significant for perforated duodenal ulcer s/p ex-lap, H. Pylori s/p treatment.   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 No pain with this. 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 Vitals notable for hypertension up to 829 systolic. Reassuringly, neuro exam is within normal limits apart from complete visual field deficits in right eye and sluggishly reactive pupils. Etiology likely due to carotid artery atherosclerosis vs small artery disease given patient has several risk factors including hx of tobacco abuse, cocaine use, alcohol misuse and reports not having seen a PCP in over 20 years. Cardiogenic embolism seems less likely given patient is in normal sinus rhythm, no appreciable murmur on exam. Giant cell arteritis also probably less likely. Neurology consulted in ED. Given his substantially increased risk for CVA within the next week, he will need to admit patient for further work-up. He would also benefit from CSW assistance for substance abuse and PCP needs. -Admit to med-tele, attending Dr. Owens Shark -Neurology consulted, appreciate recommendations  -BP control and statin per neuro recs -Passed bedside swallow study -Regular diet -Vitals per floor protocol -Telemetry, continuous pulse ox -Neuro checks q2h x12 hours, then q4h afterwards  -NIH stroke scale  qshift  -PT/OT eval and treat -OOB with assistance; fall precautions -Lipid panel -TSH -Echo -EKG -MR Brain without contrast -CT Angio Head and Neck  -consult to [facility] re: substance abuse information, PCP assistance, transportation assistance and potentially medication assistance  Hx H. Pylori and Perforated Gastric Ulcer s/p Omental [name] Patch (09/05/19) Patient admitted from 09/05/19-09/19/19 for perforated gastric ulcer with peritonitis requiring exploratory laparotomy and ultimately treated with omental [name] patch and IR drainage. Also found to be H. Pylori positive treated with PPI, metronidazole, bismuth and TCN x2 weeks). After treatment, he was recommended to have a follow up EGD/Colonoscopy but patient declined.  -Recommend outpatient colonoscopy for colon cancer screening as patient has never had one  Tobacco Abuse  Polysubstance Use - THC, Cocaine  Alcohol Misuse Smokes about 1 pack over 3 days, has smoked for over 20 years. Drinks 1-2 shots of Brandy every couple days. Smokes THC every day. Smokes cocaine every coupe days- last time was on Friday (about $50 worth). -Encourage cessation -CIWA -Offer Nicotine patch 14mg  daily  -CSW for resources, as above  Pre-Diabetes Hemoglobin A1c returned as 5.9.  -Recommend lifestyle interventions -Establish with PCP upon d/c    FEN/GI: NPO until after bed  Prophylaxis: DVT ppx  Disposition:   History of Present Illness:  Albert Love is a 80 y.o. male presenting with vision loss in right eye. Patient was in normal state of health on Saturday night and went to bed, woke up with vision loss in his right eye. He can see movement and light, but no color, no detail. He has not had any weakness, decrease in sensation, or other focal neurodeficit. Patient lives by himself and he performs all of his ADLs by himself, his friend drives him to pick up groceries.  He consumes EtOH, brandy or wine, one-two glasses twice a week. Patient smokes 1/3  ppd x 60 years= 20 pack years. He uses marijuana and crack cocaine x1 per week. He last used crack on Friday night. He is originally from [place], has been in the [place] since 1974.  In ED: PT/INR WNL, aPTT increased to 38 seconds. 20/50 visual acuity in left eye, NT in right. Neurology consulted.   Review Of Systems: Per HPI with the following additions:   Review of Systems  Constitutional: Negative for activity change, chills and fever.  HENT: Negative for congestion and sneezing.   Eyes: Positive for visual disturbance.  Respiratory: Negative for cough and shortness of breath.   Cardiovascular: Negative for chest pain and palpitations.  Gastrointestinal: Negative for abdominal pain, constipation, diarrhea, nausea and vomiting.  Neurological: Negative for dizziness, facial asymmetry, weakness, light-headedness and headaches.  Psychiatric/Behavioral: Negative for agitation, behavioral problems, confusion and decreased concentration.  All other systems reviewed and are negative.    Patient Active Problem List   Diagnosis Date Noted  . Pneumoperitoneum 09/05/2019  . Acute renal insufficiency 09/05/2019  . Perforated gastric ulcer s/p omental [name] patch 09/05/2019 09/05/2019  . Coagulopathy ([place]) 09/05/2019    Past Medical History: Past Medical History:  Diagnosis Date  . Acute renal insufficiency 09/05/2019  . Perforated gastric ulcer s/p omental [name] patch 09/05/2019 09/05/2019    Past Surgical History: Past Surgical History:  Procedure Laterality Date  . BOWEL RESECTION N/A 09/05/2019   Procedure: SMALL BOWEL RESECTION;  Surgeon: Clovis Riley, MD;  Location: [facility];  Service: General;  Laterality: N/A;  . IR SINUS/FIST TUBE CHK-NON GI  09/19/2019  . IR SINUS/FIST TUBE CHK-NON GI  09/19/2019  . LAPAROTOMY N/A 09/05/2019   Procedure: EXPLORATORY LAPAROTOMY WITH [facility] AND GASTRIC BIOPSY;  Surgeon: Clovis Riley, MD;  Location: [facility];  Service: General;  Laterality: N/A;     Social History: Social History   Tobacco Use  . Smoking status: Current Every Day Smoker    Packs/day: 0.50    Types: Cigarettes  . Smokeless tobacco: Never Used  Substance Use Topics  . Alcohol use: Yes    Comment: occasionally    Additional social history: Patient admits to smoking cocaine (a couple times a week) and daily THC use. Drinks 1-2 shots of Brandy every couple days.   Please also refer to relevant sections of EMR.  Family History: Family History  Problem Relation Age of Onset  . Colon cancer Neg Hx   . Pancreatic cancer Neg Hx   . Esophageal cancer Neg Hx     Allergies and Medications: No Known Allergies No current facility-administered medications on file prior to encounter.   No current outpatient medications on file prior to encounter.    Objective: BP (!) 155/93   Pulse 68   Temp 97.8 F (36.6 C) (Oral)   Resp (!) 25   SpO2 100%  Exam: General: Awake, alert, oriented x4, pleasant, in no acute distress Eyes: EOMI, pupils are sluggishly reactive to light and accomodation b/l ENTM: nares patent, dentures Neck: supple Cardiovascular: RRR without murmur Respiratory: lungs CTAB without wheezing/rhonchi/rales Gastrointestinal: abdomen soft, non-tender in all quadrants, without rebound or guarding, midline scar from previous laparotomy with mild keloid formation MSK: 5/5 strength in upper and lower extremities b/l, no obvious deformities Derm: midline abdominal scar as above, skin is warm and dry, b/l feet with dry and flaking skin Neuro: Cranial Nerves: II: Visual Fields are full  in left eye, right eye with very limited visual fields, patient is able to sense motion but does not see color in eye and vision is significantly decreased. Pupils are sluggishly reactive bilaterally III,IV, VI: EOMI without ptosis or diplopia. V: Facial sensation is symmetric to touch VII: Facial movement is symmetric.  VIII: hearing is intact to voice X: Palate elevates  symmetrically XI: Shoulder shrug is symmetric. XII: tongue is midline without atrophy or fasciculations.  Motor: Tone is normal. Bulk is normal. 5/5 strength was present in all four extremities.  Sensory: Sensation is symmetric to light touch and temperature in the arms and legs. Deep Tendon Reflexes: 1+ patellar tendon reflexes bilaterally, 2+ brachioradialis and biceps bilaterally  Psych: Appropriate mood and affect   Labs and Imaging: CBC BMET  Recent Labs  Lab 10/19/20 1350  WBC 7.9  HGB 11.1*  HCT 36.7*  PLT 279   Recent Labs  Lab 10/19/20 1350  NA 137  K 3.9  CL 104  CO2 24  BUN 10  CREATININE 1.08  GLUCOSE 96  CALCIUM 8.9     CT Head IMPRESSION: 1. Generalized cerebral atrophy without evidence of an acute intracranial abnormality.   EKG: Pending   Sharion Settler, DO 10/19/2020, 4:50 PM PGY-1, [facility] pager: [phone], text pages welcome

## 2020-10-20 ENCOUNTER — Observation Stay (HOSPITAL_BASED_OUTPATIENT_CLINIC_OR_DEPARTMENT_OTHER): Payer: Medicare HMO

## 2020-10-20 ENCOUNTER — Other Ambulatory Visit: Payer: Self-pay | Admitting: Cardiology

## 2020-10-20 ENCOUNTER — Encounter (HOSPITAL_COMMUNITY): Payer: Self-pay | Admitting: Family Medicine

## 2020-10-20 DIAGNOSIS — H349 Unspecified retinal vascular occlusion: Secondary | ICD-10-CM

## 2020-10-20 DIAGNOSIS — H3411 Central retinal artery occlusion, right eye: Secondary | ICD-10-CM

## 2020-10-20 LAB — CBC
HCT: 35.6 % — ABNORMAL LOW (ref 39.0–52.0)
Hemoglobin: 11.1 g/dL — ABNORMAL LOW (ref 13.0–17.0)
MCH: 23.4 pg — ABNORMAL LOW (ref 26.0–34.0)
MCHC: 31.2 g/dL (ref 30.0–36.0)
MCV: 75.1 fL — ABNORMAL LOW (ref 80.0–100.0)
Platelets: 286 10*3/uL (ref 150–400)
RBC: 4.74 MIL/uL (ref 4.22–5.81)
RDW: 16.3 % — ABNORMAL HIGH (ref 11.5–15.5)
WBC: 7.5 10*3/uL (ref 4.0–10.5)
nRBC: 0 % (ref 0.0–0.2)

## 2020-10-20 LAB — ECHOCARDIOGRAM COMPLETE
Area-P 1/2: 2.66 cm2
Height: 71 in
S' Lateral: 2.8 cm
Weight: 2174.4 oz

## 2020-10-20 LAB — BASIC METABOLIC PANEL
Anion gap: 9 (ref 5–15)
BUN: 9 mg/dL (ref 8–23)
CO2: 23 mmol/L (ref 22–32)
Calcium: 8.9 mg/dL (ref 8.9–10.3)
Chloride: 105 mmol/L (ref 98–111)
Creatinine, Ser: 0.92 mg/dL (ref 0.61–1.24)
GFR, Estimated: >60 mL/min (ref >60)
Glucose, Bld: 84 mg/dL (ref 70–99)
Potassium: 4.3 mmol/L (ref 3.5–5.1)
Sodium: 137 mmol/L (ref 135–145)

## 2020-10-20 MED ORDER — POLYETHYLENE GLYCOL 3350 17 G PO PACK
17.0000 g | PACK | Freq: Two times a day (BID) | ORAL | Status: DC
  Administered 2020-10-20 – 2020-10-21 (×3): 17 g via ORAL
  Filled 2020-10-20 (×3): qty 1

## 2020-10-20 MED ORDER — CLOPIDOGREL BISULFATE 75 MG PO TABS
75.0000 mg | ORAL_TABLET | Freq: Every day | ORAL | Status: DC
  Administered 2020-10-20 – 2020-10-21 (×2): 75 mg via ORAL
  Filled 2020-10-20 (×2): qty 1

## 2020-10-20 MED ORDER — ATORVASTATIN CALCIUM 80 MG PO TABS
80.0000 mg | ORAL_TABLET | Freq: Every day | ORAL | Status: DC
  Administered 2020-10-21: 80 mg via ORAL
  Filled 2020-10-20: qty 1

## 2020-10-20 MED ORDER — AMLODIPINE BESYLATE 5 MG PO TABS
5.0000 mg | ORAL_TABLET | Freq: Every day | ORAL | Status: DC
Start: 2020-10-20 — End: 2020-10-21
  Administered 2020-10-20 – 2020-10-21 (×2): 5 mg via ORAL
  Filled 2020-10-20 (×2): qty 1

## 2020-10-20 MED ORDER — SENNA 8.6 MG PO TABS
1.0000 | ORAL_TABLET | Freq: Every day | ORAL | Status: DC
  Administered 2020-10-20 – 2020-10-21 (×2): 8.6 mg via ORAL
  Filled 2020-10-20 (×2): qty 1

## 2020-10-20 MED ORDER — ASPIRIN 81 MG PO CHEW
81.0000 mg | CHEWABLE_TABLET | Freq: Every day | ORAL | Status: DC
  Administered 2020-10-21: 81 mg via ORAL
  Filled 2020-10-20: qty 1

## 2020-10-20 MED ORDER — ASPIRIN 325 MG PO TABS
325.0000 mg | ORAL_TABLET | Freq: Every day | ORAL | Status: DC
  Administered 2020-10-20: 325 mg via ORAL
  Filled 2020-10-20: qty 1

## 2020-10-20 MED ORDER — ATORVASTATIN CALCIUM 40 MG PO TABS
40.0000 mg | ORAL_TABLET | Freq: Every day | ORAL | Status: DC
  Administered 2020-10-20: 40 mg via ORAL
  Filled 2020-10-20: qty 1

## 2020-10-20 NOTE — Progress Notes (Signed)
Occupational Therapy Evaluation Patient Details Name: Albert Love MRN: [id number] DOB: 09/24/1940 Today's Date: 10/20/2020    History of Present Illness 80 y.o. male with a medical history significant for perforated gastric ulcer s/p ex-lap who presented to the ED from his outpatient ophthalmaologist for evaluation of acute right eye vision loss with concern for CRAO   Clinical Impression   PTA pt lives alone and independent with ADL/IADL tasks and mobility. Pt does not drive/uses a cab/friends to run errands. Pt presents with apparent vision loss R eye resulting in impaired depth perception, impacting safety and increasing risk for falls. Began education on compensatory strategies for low/impaired vision. Recommend follow up with [place]. Pt agreeable. Dicussed with PT and CM. Will follow up acutely. Pt very appreciative.     Follow Up Recommendations  Home health OT;Supervision - Intermittent    Equipment Recommendations  None recommended by OT    Recommendations for Other Services       Precautions / Restrictions Precautions Precautions: Fall Precaution Comments: impaired depth perception Restrictions Weight Bearing Restrictions: No      Mobility Bed Mobility Overal bed mobility: Independent                  Transfers Overall transfer level: Modified independent                    Balance Overall balance assessment: Mild deficits observed, not formally tested                                         ADL either performed or assessed with clinical judgement   ADL Overall ADL's : Needs assistance/impaired                                     Functional mobility during ADLs: Supervision/safety General ADL Comments: Able to complete ADL tasks, although clumsy at times due to impaired depth perception. Began educating on using sense of touch, increase contrast/lighting adn reduce clutter to increase safety with ADL as a  compensatory strategy     Vision Baseline Vision/History: Wears glasses Wears Glasses: Reading only Patient Visual Report: Blurring of vision;Other (comment) (R eye vision loss) Vision Assessment?: Yes Eye Alignment: Within Functional Limits Ocular Range of Motion: Within Functional Limits Alignment/Gaze Preference: Within Defined Limits Tracking/Visual Pursuits: Decreased smoothness of horizontal tracking;Decreased smoothness of vertical tracking Saccades: Additional eye shifts occurred during testing Convergence: Within functional limits Visual Fields: Right visual field deficit (States he can see partial R spuerior field however blurred) Depth Perception: Overshoots;Undershoots     Perception     Praxis Praxis Praxis tested?: Within functional limits    Pertinent Vitals/Pain Pain Assessment: No/denies pain     Hand Dominance Right   Extremity/Trunk Assessment Upper Extremity Assessment Upper Extremity Assessment: Overall WFL for tasks assessed   Lower Extremity Assessment Lower Extremity Assessment: Defer to PT evaluation   Cervical / Trunk Assessment Cervical / Trunk Assessment: Normal   Communication Communication Communication: No difficulties   Cognition Arousal/Alertness: Awake/alert Behavior During Therapy: WFL for tasks assessed/performed Overall Cognitive Status: Within Functional Limits for tasks assessed  General Comments       Exercises     Shoulder Instructions      Home Living Family/patient expects to be discharged to:: Private residence Living Arrangements: Alone Available Help at Discharge: Family;Available PRN/intermittently Type of Home: Apartment Home Access: Level entry     Home Layout: One level     Bathroom Shower/Tub: Tub/shower unit;Curtain   Biochemist, clinical: Standard Bathroom Accessibility: Yes How Accessible: Accessible via walker Home Equipment: Grab bars -  tub/shower;Grab bars - toilet          Prior Functioning/Environment Level of Independence: Independent        Comments: does not drive; uses a cab/friends; independent with cooking/cleaning/finanacial management ("just laying around watching TV")        OT Problem List: Decreased safety awareness;Impaired vision/perception      OT Treatment/Interventions: Self-care/ADL training;DME and/or AE instruction;Therapeutic activities;Visual/perceptual remediation/compensation;Patient/family education;Balance training    OT Goals(Current goals can be found in the care plan section) Acute Rehab OT Goals Patient Stated Goal: to be independent OT Goal Formulation: With patient Time For Goal Achievement: 11/03/20 Potential to Achieve Goals: Good  OT Frequency: Min 2X/week   Barriers to D/C:            Co-evaluation              AM-PAC OT "6 Clicks" Daily Activity     Outcome Measure Help from another person eating meals?: None Help from another person taking care of personal grooming?: None Help from another person toileting, which includes using toliet, bedpan, or urinal?: None Help from another person bathing (including washing, rinsing, drying)?: None Help from another person to put on and taking off regular upper body clothing?: None Help from another person to put on and taking off regular lower body clothing?: None 6 Click Score: 24   End of Session Nurse Communication: Other (comment) (DC needs)  Activity Tolerance: Patient tolerated treatment well Patient left: in bed;with call bell/phone within reach;Other (comment) (leaving for procedure)  OT Visit Diagnosis: Low vision, both eyes (H54.2)                Time: 6283-1517 OT Time Calculation (min): 24 min Charges:  OT General Charges $OT Visit: 1 Visit OT Evaluation $OT Eval Low Complexity: [place], OT/L   Acute OT Clinical Specialist [place] Pager [phone] Office  [phone]   [facility] 10/20/2020, 9:39 AM

## 2020-10-20 NOTE — Progress Notes (Signed)
Family Medicine Teaching Service Daily Progress Note Intern Pager: [phone]  Patient name: Albert Love record number: [id number] Date of birth: 15-Jun-1941 Age: 80 y.o. Gender: male  Primary Care Provider: Patient, No Pcp Per Consultants: Neurology Code Status: FULL, though does not want prolonged ventilatory support  Pt Overview and Major Events to Date:  Admitted: 3/7  Assessment and Plan: Albert Love is a 80 y.o. male presenting with right vision loss. PMH is significant for polysubstance abuse with tobacco, cocaine, THC and perforated duodenal ulcer s/p ex-lap, H. Pylori s/p treatment.   Right Central Retinal Artery Occlusion Patient remained hypertensive overnight, with systolics in the 357S to 177L.  Lipid panel notable for cholesterol 203, LDL 131.  TSH within normal limits.  EKG normal sinus rhythm.  MRI brain without acute infarct but significant for moderate chronic microvascular ischemic disease. CT angio head and neck negative for large vessel occlusion or significant stenosis of the head or neck but with aortic atherosclerosis and emphysema. ASCVD 30%.  -Neurology following, appreciate recommendations -Aspirin 325 mg followed by 81 mg daily -Atorvastatin 40 mg daily  -Follow-up speech/PT/OT recs -F/u Echocardiogram  Hx H. 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  Patient is not currently on PPI or H2 blockers.  Will perform stool antigen as test of cure.  If positive, he will need to be treated for H. pylori. -H. Pylori stool antigen -Start PPI after antigen test -Recommend outpatient follow-up with GI for routine colonoscopy (has never had)  Tobacco Abuse  Polysubstance Use - THC, Cocaine  Alcohol Misuse CIWA 0 overnight. -Nicotine patch -CSW consult -Encourage cessation   FEN/GI: Regular diet PPx:    Status is: Observation  The patient remains OBS  appropriate and will d/c before 2 midnights.  Dispo: The patient is from: Home              Anticipated d/c is to: Home              Patient currently is not medically stable to d/c.   Difficult to place patient No   Subjective:  Patient feels fine this morning.  He continues to have loss of vision in his right eye.  He is able to see motion but cannot see color.  He has no pain.  He has no other complaints.  He is motivated to make lifestyle changes as he feels this was a wake-up call for him.  He is hopeful to go home today.  Objective: Temp:  [97.8 F (36.6 C)-98.4 F (36.9 C)] 98.3 F (36.8 C) (03/08 0447) Pulse Rate:  [61-77] 62 (03/08 0447) Resp:  [14-25] 18 (03/08 0447) BP: (133-174)/(85-105) 164/89 (03/08 0447) SpO2:  [96 %-100 %] 100 % (03/08 0447) FiO2 (%):  [21 %] 21 % (03/07 1753) Weight:  [61.6 kg] 61.6 kg (03/07 2125) Physical Exam: General: Awake, alert, in no distress, pleasant, arcus senilis Cardiovascular: RRR, no murmur Respiratory: CTAB, no wheezing/rhonchi/rales Abdomen: Abdomen soft, non-distended, non-tender in all quadrants GU: reducible right inguinal hernia, non-tender, non-erythematous  Extremities: No edema, 2+ radial and DP pulses Neuro: AxO x4, follows commands, speech is not slurred, 5/5 strength. Significantly decreased visual fields in right eye- only able to appreciate movement. Right eye non-responsive to light and accomodation, left eye is sluggishly reactive to light and accomodation   Laboratory: Recent Labs  Lab 10/19/20 1350 10/20/20 0444  WBC 7.9 7.5  HGB 11.1* 11.1*  HCT 36.7* 35.6*  PLT 279 286   Recent Labs  Lab 10/19/20 1350  NA 137  K 3.9  CL 104  CO2 24  BUN 10  CREATININE 1.08  CALCIUM 8.9  PROT 6.2*  BILITOT 0.6  ALKPHOS 62  ALT 8  AST 13*  GLUCOSE 96    Imaging/Diagnostic Tests: MRI brain without contrast IMPRESSION: 1. No evidence of acute intracranial abnormality.  No acute infarct. 2. Moderate chronic  microvascular ischemic disease.  CTA angio head and neck IMPRESSION: 1. No emergent large vessel occlusion or hemodynamically significant stenosis of the head or neck. Aortic Atherosclerosis (ICD10-I70.0) and Emphysema (ICD10-J43.9).    Sharion Settler, DO 10/20/2020, 5:31 AM PGY-1, [name] Intern pager: [phone], text pages welcome

## 2020-10-20 NOTE — TOC Initial Note (Signed)
Transition of Care [facility]) - Initial/Assessment Note    Patient Details  Name: Albert Love MRN: [id number] Date of Birth: 1941/05/26  Transition of Care [facility]) CM/SW Contact:    [name], RN Phone Number: 10/20/2020, 12:42 PM  Clinical Narrative:                 Patient from home alone. Confirmed face sheet information.   NCM was secure chatted to arrange HHPT/OT.   Patient does not have a PCP has appointment at [facility] and wellness 3/11. Dr Nita Sells agreed to follow patient until he establishes care with [facility].   Referral given to [facility] with [facility]. Asked MD for orders for HHPT/OT and face to face.   Expected Discharge Plan: [place] Barriers to Discharge: Continued Medical Work up   Patient Goals and CMS Choice Patient states their goals for this hospitalization and ongoing recovery are:: to return to home CMS Medicare.gov Compare Post Acute Care list provided to:: Patient Choice offered to / list presented to : Patient  Expected Discharge Plan and Services Expected Discharge Plan: [place] Choice: [place] arrangements for the past 2 months: Apartment                 DME Arranged: N/A         HH Arranged: PT,OT [place]: [place] Date [place]: 10/20/20 Time [place]: 1093 Representative spoke with at [place]: Tommi Rumps  Prior Living Arrangements/Services Living arrangements for the past 2 months: Apartment Lives with:: Self Patient language and need for interpreter reviewed:: Yes Do you feel safe going back to the place where you live?: Yes            Criminal Activity/Legal Involvement Pertinent to Current Situation/Hospitalization: No - Comment as needed  Activities of Daily Living Home Assistive Devices/Equipment: None ADL Screening (condition at time of admission) Patient's cognitive ability adequate to safely complete daily  activities?: Yes Is the patient deaf or have difficulty hearing?: No Does the patient have difficulty seeing, even when wearing glasses/contacts?: Yes Does the patient have difficulty concentrating, remembering, or making decisions?: No Patient able to express need for assistance with ADLs?: Yes Does the patient have difficulty dressing or bathing?: No Independently performs ADLs?: Yes (appropriate for developmental age) Does the patient have difficulty walking or climbing stairs?: No Weakness of Legs: None Weakness of Arms/Hands: None  Permission Sought/Granted   Permission granted to share information with : No              Emotional Assessment Appearance:: Appears stated age Attitude/Demeanor/Rapport: Engaged Affect (typically observed): Accepting Orientation: : Oriented to Self,Oriented to Place,Oriented to  Time,Oriented to Situation Alcohol / Substance Use: Not Applicable Psych Involvement: No (comment)  Admission diagnosis:  Central retinal artery occlusion [H34.10] Central retinal artery occlusion, right [H34.11] Patient Active Problem List   Diagnosis Date Noted  . Central retinal artery occlusion 10/19/2020  . Central retinal artery occlusion, right 10/19/2020  . Pneumoperitoneum 09/05/2019  . Acute renal insufficiency 09/05/2019  . Perforated gastric ulcer s/p omental [name] patch 09/05/2019 09/05/2019  . Coagulopathy ([place]) 09/05/2019   PCP:  Patient, No Pcp Per Pharmacy:   [id number] [address], [place] [address] Phone: [phone] Fax: [phone]     Social Determinants of Health (SDOH) Interventions    Readmission Risk Interventions  No flowsheet data found.

## 2020-10-20 NOTE — Progress Notes (Signed)
  Echocardiogram 2D Echocardiogram has been performed.  Jennette Dubin 10/20/2020, 12:30 PM

## 2020-10-20 NOTE — Progress Notes (Signed)
    Cardiology asked to order outpatient cardiac monitor in the setting of retinal artery occlusion. To be read by Dr. Sallyanne Kuster (inpatient DOD 3/8).

## 2020-10-20 NOTE — Progress Notes (Addendum)
STROKE TEAM PROGRESS NOTE   INTERVAL HISTORY No acute events overnight.   He reports that Saturday night he was watching tv when the vision in his Right eye suddenly became blurry. He reports that he then went to bed and when he woke up his vision in his Right eye was almost completely gone. He reports he can no longer see color and can only see motion in his Right eye. Discussed the importance of smoking cessation, and no longer using Cocaine and THC. He reported understanding and that this was "a wake up call," and that he would stop. Discussed the need for further workup and the plan to use dual therapy of aspirin and Plavix for 3 weeks then using just Aspirin afterwards.   Family Medicine is Primary. Neuro exam reveals Right eye near total loss of vision but otherwise has no deficits. Will start Aspirin and Plavix for 3 weeks then only Aspirin going forward.  Vitals:   10/19/20 2100 10/19/20 2125 10/20/20 0014 10/20/20 0447  BP: (!) 173/105 (!) 161/98 (!) 149/88 (!) 164/89  Pulse: 61 64 69 62  Resp: 19 16 18 18   Temp:  98.4 F (36.9 C) 98.3 F (36.8 C) 98.3 F (36.8 C)  TempSrc:  Oral Oral Oral  SpO2: 97% 100% 99% 100%  Weight:  61.6 kg    Height:  5\' 11"  (1.803 m)     CBC:  Recent Labs  Lab 10/19/20 1350 10/20/20 0444  WBC 7.9 7.5  NEUTROABS 5.3  --   HGB 11.1* 11.1*  HCT 36.7* 35.6*  MCV 76.6* 75.1*  PLT 279 627   Basic Metabolic Panel:  Recent Labs  Lab 10/19/20 1350 10/20/20 0444  NA 137 137  K 3.9 4.3  CL 104 105  CO2 24 23  GLUCOSE 96 84  BUN 10 9  CREATININE 1.08 0.92  CALCIUM 8.9 8.9   Lipid Panel:  Recent Labs  Lab 10/19/20 1350  CHOL 203*  TRIG 49  HDL 62  CHOLHDL 3.3  VLDL 10  LDLCALC 131*   HgbA1c:  Recent Labs  Lab 10/19/20 1350  HGBA1C 5.9*   Urine Drug Screen: No results for input(s): LABOPIA, COCAINSCRNUR, LABBENZ, AMPHETMU, THCU, LABBARB in the last 168 hours.  Alcohol Level No results for input(s): ETH in the last 168  hours.  IMAGING past 24 hours CT Code Stroke CTA Head W/WO contrast  Result Date: 10/19/2020 CLINICAL DATA:  Right eye vision loss. EXAM: CT ANGIOGRAPHY HEAD AND NECK TECHNIQUE: Multidetector CT imaging of the head and neck was performed using the standard protocol during bolus administration of intravenous contrast. Multiplanar CT image reconstructions and MIPs were obtained to evaluate the vascular anatomy. Carotid stenosis measurements (when applicable) are obtained utilizing NASCET criteria, using the distal internal carotid diameter as the denominator. CONTRAST:  83mL OMNIPAQUE IOHEXOL 350 MG/ML SOLN COMPARISON:  None. FINDINGS: CTA NECK FINDINGS SKELETON: There is no bony spinal canal stenosis. No lytic or blastic lesion. OTHER NECK: Normal pharynx, larynx and major salivary glands. No cervical lymphadenopathy. Unremarkable thyroid gland. UPPER CHEST: Emphysema AORTIC ARCH: There is calcific atherosclerosis of the aortic arch. There is no aneurysm, dissection or hemodynamically significant stenosis of the visualized portion of the aorta. Conventional 3 vessel aortic branching pattern. The visualized proximal subclavian arteries are widely patent. RIGHT CAROTID SYSTEM: No dissection, occlusion or aneurysm. Mild atherosclerotic calcification at the carotid bifurcation without hemodynamically significant stenosis. LEFT CAROTID SYSTEM: No dissection, occlusion or aneurysm. Mild atherosclerotic calcification at the carotid bifurcation  without hemodynamically significant stenosis. VERTEBRAL ARTERIES: Left dominant configuration. Both origins are clearly patent. There is no dissection, occlusion or flow-limiting stenosis to the skull base (V1-V3 segments). CTA HEAD FINDINGS POSTERIOR CIRCULATION: --Vertebral arteries: Normal V4 segments. --Inferior cerebellar arteries: Normal. --Basilar artery: Normal. --Superior cerebellar arteries: Normal. --Posterior cerebral arteries (PCA): Normal. ANTERIOR CIRCULATION:  --Intracranial internal carotid arteries: Normal. --Anterior cerebral arteries (ACA): Normal. Both A1 segments are present. Patent anterior communicating artery (a-comm). --Middle cerebral arteries (MCA): Normal. VENOUS SINUSES: As permitted by contrast timing, patent. ANATOMIC VARIANTS: None Review of the MIP images confirms the above findings. IMPRESSION: 1. No emergent large vessel occlusion or hemodynamically significant stenosis of the head or neck. Aortic Atherosclerosis (ICD10-I70.0) and Emphysema (ICD10-J43.9). Electronically Signed   By: Ulyses Jarred M.D.   On: 10/19/2020 19:26   CT HEAD WO CONTRAST  Result Date: 10/19/2020 CLINICAL DATA:  Loss of vision in the right eye over the last few days. EXAM: CT HEAD WITHOUT CONTRAST TECHNIQUE: Contiguous axial images were obtained from the base of the skull through the vertex without intravenous contrast. COMPARISON:  None. FINDINGS: Brain: There is mild cerebral atrophy with widening of the extra-axial spaces and ventricular dilatation. There are areas of decreased attenuation within the white matter tracts of the supratentorial brain, consistent with microvascular disease changes. Vascular: No hyperdense vessel or unexpected calcification. Skull: Normal. Negative for fracture or focal lesion. Sinuses/Orbits: No acute finding. Other: None. IMPRESSION: 1. Generalized cerebral atrophy without evidence of an acute intracranial abnormality. Electronically Signed   By: Virgina Norfolk M.D.   On: 10/19/2020 15:45   CT Code Stroke CTA Neck W/WO contrast  Result Date: 10/19/2020 CLINICAL DATA:  Right eye vision loss. EXAM: CT ANGIOGRAPHY HEAD AND NECK TECHNIQUE: Multidetector CT imaging of the head and neck was performed using the standard protocol during bolus administration of intravenous contrast. Multiplanar CT image reconstructions and MIPs were obtained to evaluate the vascular anatomy. Carotid stenosis measurements (when applicable) are obtained utilizing  NASCET criteria, using the distal internal carotid diameter as the denominator. CONTRAST:  38mL OMNIPAQUE IOHEXOL 350 MG/ML SOLN COMPARISON:  None. FINDINGS: CTA NECK FINDINGS SKELETON: There is no bony spinal canal stenosis. No lytic or blastic lesion. OTHER NECK: Normal pharynx, larynx and major salivary glands. No cervical lymphadenopathy. Unremarkable thyroid gland. UPPER CHEST: Emphysema AORTIC ARCH: There is calcific atherosclerosis of the aortic arch. There is no aneurysm, dissection or hemodynamically significant stenosis of the visualized portion of the aorta. Conventional 3 vessel aortic branching pattern. The visualized proximal subclavian arteries are widely patent. RIGHT CAROTID SYSTEM: No dissection, occlusion or aneurysm. Mild atherosclerotic calcification at the carotid bifurcation without hemodynamically significant stenosis. LEFT CAROTID SYSTEM: No dissection, occlusion or aneurysm. Mild atherosclerotic calcification at the carotid bifurcation without hemodynamically significant stenosis. VERTEBRAL ARTERIES: Left dominant configuration. Both origins are clearly patent. There is no dissection, occlusion or flow-limiting stenosis to the skull base (V1-V3 segments). CTA HEAD FINDINGS POSTERIOR CIRCULATION: --Vertebral arteries: Normal V4 segments. --Inferior cerebellar arteries: Normal. --Basilar artery: Normal. --Superior cerebellar arteries: Normal. --Posterior cerebral arteries (PCA): Normal. ANTERIOR CIRCULATION: --Intracranial internal carotid arteries: Normal. --Anterior cerebral arteries (ACA): Normal. Both A1 segments are present. Patent anterior communicating artery (a-comm). --Middle cerebral arteries (MCA): Normal. VENOUS SINUSES: As permitted by contrast timing, patent. ANATOMIC VARIANTS: None Review of the MIP images confirms the above findings. IMPRESSION: 1. No emergent large vessel occlusion or hemodynamically significant stenosis of the head or neck. Aortic Atherosclerosis  (ICD10-I70.0) and Emphysema (ICD10-J43.9). Electronically Signed  By: Ulyses Jarred M.D.   On: 10/19/2020 19:26   MR BRAIN WO CONTRAST  Result Date: 10/19/2020 CLINICAL DATA:  Neuro deficit, acute stroke suspected. EXAM: MRI HEAD WITHOUT CONTRAST TECHNIQUE: Multiplanar, multiecho pulse sequences of the brain and surrounding structures were obtained without intravenous contrast. COMPARISON:  Same day CT head. FINDINGS: Brain: No acute infarction, hemorrhage, hydrocephalus, extra-axial collection or mass lesion. Moderate scattered T2/FLAIR hyperintensities within the white matter, most likely related to chronic microvascular ischemic disease. Cavum septum pellucidum et vergae, anatomic variant. Vascular: Major arterial flow voids are maintained at the skull base. Skull and upper cervical spine: Normal marrow signal. Sinuses/Orbits: Sinuses are largely clear. Other: No mastoid effusions. IMPRESSION: 1. No evidence of acute intracranial abnormality.  No acute infarct. 2. Moderate chronic microvascular ischemic disease. Electronically Signed   By: Margaretha Sheffield MD   On: 10/19/2020 18:28    PHYSICAL EXAM Blood pressure (!) 154/98, pulse 95, temperature 99.3 F (37.4 C), temperature source Oral, resp. rate 18, height 5\' 11"  (1.803 m), weight 61.6 kg, SpO2 99 %.  General: alert and awake, elderly African American male, no apparent distress  Lungs: Symmetrical Chest rise, no labored breathing  Cardio: Regular Rate and Rhythm  Abdomen: Soft, non-tender  Neuro: Alert, oriented, thought content appropriate.  Speech fluent without evidence of aphasia.  Able to follow all commands without difficulty. Cranial Nerves: II:  Visual field normal on the left side, almost no vision on Right side, no blink to threat on Right III,IV, VI: ptosis not present, extra-ocular motions intact bilaterally V,VII: smile symmetric, facial light touch sensation normal bilaterally VIII: hearing normal bilaterally IX,X:  uvula rises symmetrically XI: bilateral shoulder shrug XII: midline tongue extension without atrophy or fasciculations  Motor: Right : Upper extremity   5/5    Left:     Upper extremity   5/5  Lower extremity   5/5     Lower extremity   5/5 Tone and bulk:normal tone throughout; no atrophy noted Sensory: light touch intact throughout, bilaterally Cerebellar: normal heel-to-shin test Gait: deferred    ASSESSMENT/PLAN Mr. Albert Love is a 80 y.o. male with history of perforated gastric ulcer s/p ex-lap presenting with acute right eye vision loss since late Saturday night/Sunday morning. Patient states he was watching television late Saturday night when his vision became blurry in his right eye. He then went to bed sometime after midnight and when he woke Sunday morning, his vision in his right eye was completely impaired. He describes his vision as looking through thick cobwebs in a bright environment. He went to the ophthalmologist today for evaluation and was sent to the ED with concern for CRAO.    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  CTA head & neck - No emergent large vessel occlusion or hemodynamically significant stenosis of the head or neck.  MRI - No evidence of acute intracranial abnormality.  No acute infarct. Moderate chronic microvascular ischemic disease.  2D Echo - EF 60-65%. No wall motion abnormalities. Aortic valve moderately thickened and calcified.   LDL 131  HgbA1c 5.9  VTE prophylaxis - SCD's    Diet   Diet regular Room service appropriate? Yes; Fluid consistency: Thin     No antithrombotic prior to admission, now on aspirin 81 mg daily after initial dose of Aspirin 325 mg PO.  Discharge  on dual antiplatelets-aspirin 81 Plavix 75 for 3 weeks followed by aspirin only.  30-day  cardiac monitoring as an outpatient to rule out paroxysmal atrial fibrillation.  Therapy recommendations:  Home Health OT  Disposition: Pending  Hypertension  Home meds:  None  Stable . Permissive hypertension (OK if < 220/120) but gradually normalize in 5-7 days . Long-term BP goal normotensive  Hyperlipidemia  Home meds: none  LDL 131, goal < 70  Add Atorvastatin 80 mg   Continue statin at discharge   Other Stroke Risk Factors  Advanced Age >/= 73   Cigarette smoker advised to stop smoking  ETOH use, drinks 1-2 shots every couple of days  Substance abuse - UDS:  THC daily,   Uses Cocaine- every couple of days. Patient advised to stop using due to stroke risk.  Other Active Problems  Possible H. Pylori - Family Medicine testing  Outpatient follow-up with [facility] in 4 to 6 weeks Inpatient neurology service will sign off.  Please call with questions as needed.  Hospital day # 0  Attending Neurohospitalist Addendum Patient seen and examined with APP/Resident. Agree with the history and physical as documented above. Agree with the plan as documented, which I helped formulate. I have independently reviewed the chart, obtained history, review of systems and examined the patient.I have personally reviewed pertinent head/neck/spine imaging (CT/MRI). Please feel free to call with any questions. --- Amie Portland, MD [facility] Pager: [phone]  If 7pm to 7am, please call on call as listed on AMION.    To contact Stroke Continuity provider, please refer to [email or web address] After hours, contact General Neurology

## 2020-10-20 NOTE — Evaluation (Addendum)
Physical Therapy Evaluation Patient Details Name: Albert Love MRN: [id number] DOB: June 17, 1941 Today's Date: 10/20/2020   History of Present Illness  Khylen is a 80 y/o who was sent to the ED after going to his opthalmalogist for concerns regarding acute right vision loss. Pt was admitted on 10/19/20 and diagnosed with central retinal artery occlusion.   Clinical Impression  Pt received in bed, very cooperative and pleasant. Able to turn head to right appropriately and correctly identify number of fingers PT held up. Mod I to I for all mobility.  No LOB and able to safely scan environment during mobility to ensure he maintained appropriate distance from objects (doors, walls, people, etc). Reinforced importance of scanning environment before and during mobility as well as home management (using bright markers to indicate where corners or certain items are). Pt demonstrated understanding of all education. Left in chair with all needs met and call bell within reach. Suspect mobility is close to baseline and do not anticipate need for PT follow up, but will continue to monitor acutely.  Addendum: After further discussion and conversation with the pt's healthcare team, recommending home health for home safety evaluation.     Follow Up Recommendations Home health PT    Equipment Recommendations  None recommended by PT    Recommendations for Other Services       Precautions / Restrictions Precautions Precautions: Fall Precaution Comments: impaired depth perception, low fall, can't see out of R eye Restrictions Weight Bearing Restrictions: No      Mobility  Bed Mobility Overal bed mobility: Independent                  Transfers Overall transfer level: Modified independent                  Ambulation/Gait Ambulation/Gait assistance: Modified independent (Device/Increase time) Gait Distance (Feet): 400 Feet Assistive device: None Gait Pattern/deviations: WFL(Within  Functional Limits);Step-through pattern Gait velocity: Decreased to scan environment as walking      Stairs            Wheelchair Mobility    Modified Rankin (Stroke Patients Only)       Balance Overall balance assessment: Modified Independent                                           Pertinent Vitals/Pain Pain Assessment: No/denies pain    Home Living                        Prior Function                 Hand Dominance        Extremity/Trunk Assessment   Upper Extremity Assessment Upper Extremity Assessment: Defer to OT evaluation    Lower Extremity Assessment Lower Extremity Assessment: Overall WFL for tasks assessed    Cervical / Trunk Assessment Cervical / Trunk Assessment: Kyphotic  Communication      Cognition Arousal/Alertness: Awake/alert Behavior During Therapy: WFL for tasks assessed/performed Overall Cognitive Status: Within Functional Limits for tasks assessed                                 General Comments: Cooperative, pleasant, makes jokes, laughs appropriately at jokes      General Comments  Exercises     Assessment/Plan    PT Assessment Patient needs continued PT services  PT Problem List Decreased strength;Decreased mobility;Decreased coordination;Decreased balance       PT Treatment Interventions Therapeutic exercise;Gait training;Balance training;Stair training;Neuromuscular re-education;Therapeutic activities;Patient/family education;Functional mobility training    PT Goals (Current goals can be found in the Care Plan section)  Acute Rehab PT Goals Patient Stated Goal: to be independent    Frequency Min 3X/week   Barriers to discharge        Co-evaluation               AM-PAC PT "6 Clicks" Mobility  Outcome Measure Help needed turning from your back to your side while in a flat bed without using bedrails?: None Help needed moving from lying on your  back to sitting on the side of a flat bed without using bedrails?: None Help needed moving to and from a bed to a chair (including a wheelchair)?: None Help needed standing up from a chair using your arms (e.g., wheelchair or bedside chair)?: None Help needed to walk in hospital room?: None Help needed climbing 3-5 steps with a railing? : A Little 6 Click Score: 23    End of Session Equipment Utilized During Treatment: Gait belt Activity Tolerance: Patient tolerated treatment well Patient left: in chair Nurse Communication: Mobility status PT Visit Diagnosis: Other abnormalities of gait and mobility (R26.89)    Time: 9244-6286 PT Time Calculation (min) (ACUTE ONLY): 21 min   Charges:   PT Evaluation $PT Eval Low Complexity: 1 Low     Rosita Kea, SPT

## 2020-10-20 NOTE — Discharge Summary (Signed)
[facility] Discharge Summary  Patient name: Albert Love record number: [id number] Date of birth: 1941/03/06 Age: 80 y.o. Gender: male Date of Admission: 10/19/2020  Date of Discharge: 10/21/20 Admitting Physician: Sharion Settler, DO  Primary Care Provider: Patient, No Pcp Per Consultants: Neurology  Indication for Hospitalization: Central Retinal Artery Occlusion  Discharge Diagnoses/Problem List:  Right Central Retinal Artery Occlusion HTN HLD Tobacco Abuse Cocaine Abuse THC Abuse Alcohol Misuse  Disposition: Home  Discharge Condition: Stable  Discharge Exam:  Blood pressure (!) 154/87, pulse 81, temperature 98 F (36.7 C), temperature source Oral, resp. rate 16, height 5\' 11"  (1.803 m), weight 61.6 kg, SpO2 99 %. Gen: Awake, alert, in no distress, pleasant Cardio: RRR without murmur Resp: CTAB without wheezing/rhonchi/rales Abd: soft, non-tender in all quadrants, no rebound/guarding, normoactive bowel sounds Ext: No edema, 2+ DP and radial pulses b/l  Neuro: Normal neuro exam with the exception of right pupil afferent defect, arcus senelis b/l   Brief Hospital Course:  Albert Love is a 80 y.o. male who presented with acute right vision loss 2/2 central retinal artery occlusion. PMH is significant for polysubstance abuse, perforated duodenal ulcer s/p ex-lap, H. Pylori s/p treatment. Below is his hospital course.   Right Central Retinal Artery Occlusion HTN in ED to 350'K systolic. Neurology consulted in ED. Risk stratification labs significant for Hgb A1c of 5.9, lipid panel notable for cholesterol 203, LDL 131, TSH WNL. CT Head with generalized atrophy but without acute abnormalities. MRI Brain with moderate chronic microvascular ischemic disease, CTA head and neck with aortic atherosclerosis but no emergent large vessel occlusion or significant stenosis of head or neck. Echo demonstrated LV EF 60-65% with no LV regional wall  motion abnormalities, Grade II diastolic dysfunction. NCC and LCC were moderately thickened and calcified. Per echo read, endocarditis could not be excluded. Curbsided case with cardiology who felt that TEE did not need to be pursued given patient did not clinically fit with endocarditis (no fever, normal WBC count). Echo personally reviewed by cardiologist as well who felt it could have been consistent with an old valve in a patient his age. Neurology recommended 30 day cardiac monitoring outpatient. Patient was started on atorvastatin 40 mg, increased to 80 mg qd. Dual antiplatelet therapy with aspirin 325 mg loading (then 81 mg daily) and Plavix 75 mg x 3 weeks, then recommendation to only use aspirin afterwards.   Hypertension Persistently hypertensive up to 938H systolic. Permissive HTN was allowed. Started on Amlodipine 5 mg daily on 3/8.   Hx H. Pylori and Perforated Gastric Ulcer s/p Omental [name] Patch (09/05/19) H. Pylori stool antigen collected. Results not back at time of discharge. Patient was started on Protonix at discharge.  Polysubstance Abuse  Tobacco Abuse Polysubstance abuse with cocaine and THC. Social worker consulted during inpatient hospitalization for substance abuse resources, PCP, transportation and medication assistance.   Issues for Follow-Up 1. Recommend colonoscopy for colon cancer screening, patient has not had one.  2. Continue to encourage tobacco and substance cessation   3. Recheck BP; Started on Amplodipine 5mg . Consider adjusting medication as necessary.  4. Follow up H. Pylori stool antigen, patient will  need treatment for H. Pylori if positive. If negative, will still need EGD/colonoscopy with GI for confirmation   Significant Procedures:  3/7 CT Head w/o Contrast IMPRESSION: 1. Generalized cerebral atrophy without evidence of an acute intracranial abnormality.  3/7 MR Brain without Contrast IMPRESSION: 1. No evidence of acute intracranial  abnormality.  No acute infarct. 2. Moderate chronic microvascular ischemic disease.  3/7 CT Angio Head and Neck  IMPRESSION: 1. No emergent large vessel occlusion or hemodynamically significant stenosis of the head or neck. Aortic Atherosclerosis (ICD10-I70.0) and Emphysema (ICD10-J43.9).  3/8: ECHO  IMPRESSIONS  1. Left ventricular ejection fraction, by estimation, is 60 to 65%. The  left ventricle has normal function. The left ventricle has no regional  wall motion abnormalities. Left ventricular diastolic parameters are  consistent with Grade II diastolic  dysfunction (pseudonormalization).  2. Right ventricular systolic function is normal. The right ventricular  size is normal.  3. The mitral valve is normal in structure. No evidence of mitral valve  regurgitation. No evidence of mitral stenosis.  4. The NCC and LCC are modertely thickened and calcified. These areas  appear to be c/w calcifications but I cannot exclude endocarditis.  Consider TEE if clinically indicated. . The aortic valve is calcified.  There is moderate calcification of the aortic  valve. There is moderate thickening of the aortic valve. Aortic valve  regurgitation is not visualized. No aortic stenosis is present.   Significant Labs and Imaging:  Recent Labs  Lab 10/19/20 1350 10/20/20 0444  WBC 7.9 7.5  HGB 11.1* 11.1*  HCT 36.7* 35.6*  PLT 279 286   Recent Labs  Lab 10/19/20 1350 10/20/20 0444  NA 137 137  K 3.9 4.3  CL 104 105  CO2 24 23  GLUCOSE 96 84  BUN 10 9  CREATININE 1.08 0.92  CALCIUM 8.9 8.9  ALKPHOS 62  --   AST 13*  --   ALT 8  --   ALBUMIN 3.1*  --     Results/Tests Pending at Time of Discharge:  H. Pylori stool antigen  Discharge Medications:  Allergies as of 10/21/2020   No Known Allergies     Medication List    STOP taking these medications   ibuprofen 200 MG tablet Commonly known as: ADVIL     TAKE these medications   acetaminophen 500 MG  tablet Commonly known as: TYLENOL Take 500 mg by mouth every 6 (six) hours as needed for moderate pain or headache.   amLODipine 5 MG tablet Commonly known as: NORVASC Take 1 tablet (5 mg total) by mouth daily. Start taking on: October 22, 2020   aspirin 81 MG chewable tablet Chew 1 tablet (81 mg total) by mouth daily. Start taking on: October 22, 2020   atorvastatin 80 MG tablet Commonly known as: LIPITOR Take 1 tablet (80 mg total) by mouth daily. Start taking on: October 22, 2020   clopidogrel 75 MG tablet Commonly known as: PLAVIX Take 1 tablet (75 mg total) by mouth daily for 19 days. Start taking on: October 22, 2020   pantoprazole 40 MG tablet Commonly known as: Protonix Take 1 tablet (40 mg total) by mouth daily.       Discharge Instructions: Please refer to Patient Instructions section of EMR for full details.  Patient was counseled important signs and symptoms that should prompt return to medical care, changes in medications, dietary instructions, activity restrictions, and follow up appointments.   Follow-Up Appointments:  Follow-up Information    Care, [facility] Follow up.   Specialty: Home Health Services Contact information: [place] [address] [phone]        Gladys Damme, MD. Go on 10/26/2020.   Specialty: Family Medicine Why: Please arrive at 1:30 PM for a 1:50 PM appt Contact information: [address] [id number] [place], Baker, DO 10/21/2020, 2:20 PM PGY-1, [place]

## 2020-10-21 ENCOUNTER — Encounter: Payer: Self-pay | Admitting: *Deleted

## 2020-10-21 ENCOUNTER — Other Ambulatory Visit: Payer: Self-pay | Admitting: Family Medicine

## 2020-10-21 DIAGNOSIS — H3411 Central retinal artery occlusion, right eye: Secondary | ICD-10-CM | POA: Diagnosis not present

## 2020-10-21 MED ORDER — CLOPIDOGREL BISULFATE 75 MG PO TABS: 75.0000 mg | ORAL_TABLET | Freq: Every day | ORAL | 0 refills | Status: AC

## 2020-10-21 MED ORDER — ATORVASTATIN CALCIUM 80 MG PO TABS: 80.0000 mg | ORAL_TABLET | Freq: Every day | ORAL | 0 refills | Status: DC

## 2020-10-21 MED ORDER — CLOPIDOGREL BISULFATE 75 MG PO TABS: 75.0000 mg | ORAL_TABLET | Freq: Every day | ORAL | 0 refills | Status: DC

## 2020-10-21 MED ORDER — GLYCERIN NICU SUPPOSITORY (CHIP): 1.0000 | Freq: Two times a day (BID) | RECTAL | Status: DC

## 2020-10-21 MED ORDER — AMLODIPINE BESYLATE 5 MG PO TABS: 5.0000 mg | ORAL_TABLET | Freq: Every day | ORAL | 0 refills | Status: DC

## 2020-10-21 MED ORDER — ASPIRIN 81 MG PO CHEW: 81.0000 mg | CHEWABLE_TABLET | Freq: Every day | ORAL | 0 refills | Status: DC

## 2020-10-21 MED ORDER — PANTOPRAZOLE SODIUM 40 MG PO TBEC: 40.0000 mg | DELAYED_RELEASE_TABLET | Freq: Every day | ORAL | 0 refills | Status: DC

## 2020-10-21 MED ORDER — GLYCERIN (LAXATIVE) 2 G RE SUPP
0.5000 | Freq: Two times a day (BID) | RECTAL | Status: DC
  Filled 2020-10-21 (×2): qty 1

## 2020-10-21 MED FILL — ATORVASTATIN CALCIUM 80 MG: 80 | 30 days supply | Qty: 30 | Fill #0

## 2020-10-21 MED FILL — PANTOPRAZOLE SOD DR 40 MG T: 40 | 30 days supply | Qty: 30 | Fill #0

## 2020-10-21 MED FILL — ASPIRIN LOW DOSE 81 MG CHEW: 81 | 30 days supply | Qty: 30 | Fill #0

## 2020-10-21 MED FILL — CLOPIDOGREL 75 MG TABLET: 75 | 30 days supply | Qty: 30 | Fill #0

## 2020-10-21 MED FILL — AMLODIPINE BESYLATE 5 MG TA: 5 | 30 days supply | Qty: 30 | Fill #0

## 2020-10-21 NOTE — Discharge Instructions (Signed)
[name], you were hospitalized following a central retinal artery occlusion in your right eye.  This is sort of like a mini stroke that caused you to lose vision in your eye.  While in the hospital you were seen by the neurologist.  We started you on some new medications including atorvastatin, for your cholesterol.  Aspirin and Plavix which are blood thinners and antiplatelets.  You should continue to take 81 mg of aspirin every day.  Continue to take Plavix for 19 more days (until March 28).  You should follow up with [facility], the eye doctors that you saw, in 1 week.  We also collected a stool sample to test for H. pylori.  This was an infection that you had last year and we want to confirm that it has been properly treated.  The result had not resulted by the time of your discharge.  It is very important that you follow up in our clinic for further management.  You should also follow-up with a gastroenterologist for an EGD/colonoscopy for colon screening and to confirm that the infection has cleared.  We have started you on a medication called Protonix to help with acid reflux.  We also started you on a medication called amlodipine which is for blood pressure control.   I would really encourage you to stop smoking, drinking, marijuana and cocaine.  We would be happy to assist you in this process in our clinic!

## 2020-10-21 NOTE — Progress Notes (Signed)
Nsg Discharge Note  TOC pharmacy dropped off meds to room. Discharge education completed. Questions answered.  Admit Date:  10/19/2020 Discharge date: 10/21/2020   Albert Love to be D/C'd Home per MD order.  AVS completed.  Copy for chart, and copy for patient signed, and dated. Patient/caregiver able to verbalize understanding.  Discharge Medication: Allergies as of 10/21/2020   No Known Allergies     Medication List    STOP taking these medications   ibuprofen 200 MG tablet Commonly known as: ADVIL     TAKE these medications   acetaminophen 500 MG tablet Commonly known as: TYLENOL Take 500 mg by mouth every 6 (six) hours as needed for moderate pain or headache.   amLODipine 5 MG tablet Commonly known as: NORVASC Take 1 tablet (5 mg total) by mouth daily. Start taking on: October 22, 2020   aspirin 81 MG chewable tablet Chew 1 tablet (81 mg total) by mouth daily. Start taking on: October 22, 2020   atorvastatin 80 MG tablet Commonly known as: LIPITOR Take 1 tablet (80 mg total) by mouth daily. Start taking on: October 22, 2020   clopidogrel 75 MG tablet Commonly known as: PLAVIX Take 1 tablet (75 mg total) by mouth daily for 19 days. Start taking on: October 22, 2020   pantoprazole 40 MG tablet Commonly known as: Protonix Take 1 tablet (40 mg total) by mouth daily.       Discharge Assessment: Vitals:   10/21/20 1017 10/21/20 1244  BP: (!) 149/89 (!) 154/87  Pulse: 84 81  Resp: 16 16  Temp: 98.5 F (36.9 C) 98 F (36.7 C)  SpO2: 98% 99%   Skin clean, dry and intact without evidence of skin break down, no evidence of skin tears noted. IV catheter discontinued intact. Site without signs and symptoms of complications - no redness or edema noted at insertion site, patient denies c/o pain - only slight tenderness at site.  Dressing with slight pressure applied.  D/c Instructions-Education: Discharge instructions given to patient/family with verbalized  understanding. D/c education completed with patient/family including follow up instructions, medication list, d/c activities limitations if indicated, with other d/c instructions as indicated by MD - patient able to verbalize understanding, all questions fully answered. Patient instructed to return to ED, call 911, or call MD for any changes in condition.  Patient escorted via [place], and D/C home via private auto.  Erasmo Leventhal, RN 10/21/2020 5:19 PM

## 2020-10-21 NOTE — Progress Notes (Signed)
Patient ID: Albert Love, male   DOB: Oct 15, 1940, 80 y.o.   MRN: [id number] Patient enrolled for [employer] to ship a 30 day cardiac event monitor to his home. Letter with instructions mailed to patient.

## 2020-10-21 NOTE — Progress Notes (Signed)
  Speech Language Pathology  Patient Details Name: Albert Love MRN: [id number] DOB: 25-Nov-1940 Today's Date: 10/21/2020 Time:  -     Brief interaction for speech-language-screen. No ST needs.  Pt reading handout provided by OT and appropriately asked be about a word he was not familiar with "matte"  [place] [employer] Risk analyst [phone] Office [phone]

## 2020-10-21 NOTE — Progress Notes (Signed)
Occupational Therapy Treatment Patient Details Name: Albert Love MRN: [id number] DOB: 23-Oct-1940 Today's Date: 10/21/2020    History of present illness Rohit is a 80 y/o who was sent to the ED after going to his opthalmalogist for concerns regarding acute right vision loss. Pt was admitted on 10/19/20 and diagnosed with central retinal artery occlusion.   OT comments  Focus on visual compensatory strategies. Written information provided. Recommend HHOT to assess home safety to increase independence and sasfety with IADL tasks and reduce risk of falls. All further OT to be addressed by [facility].   Follow Up Recommendations  Home health OT;Supervision - Intermittent    Equipment Recommendations  None recommended by OT    Recommendations for Other Services      Precautions / Restrictions Precautions Precautions: Fall       Mobility Bed Mobility Overal bed mobility: Independent                  Transfers                      Balance Overall balance assessment: Modified Independent                                         ADL either performed or assessed with clinical judgement   ADL                                               Vision   Additional Comments: No functional vision R eye; decreased R peripheral vision. Educated on compensatory strateiges for low vision regarding lighting, contrast adn clutter. Written information provided.   Perception     Praxis      Cognition Arousal/Alertness: Awake/alert Behavior During Therapy: WFL for tasks assessed/performed Overall Cognitive Status: Within Functional Limits for tasks assessed                                          Exercises     Shoulder Instructions       General Comments Focus on compensatory strategeis for impaired depth perception during ADL and mobility. Educated on use of "anchors" to mark end of R visual field. Educated on need for  frequent head turns, to hold rails up/down stairs and use sense of feel. Able to negotiate steps without rail going up but required rail going down.    Pertinent Vitals/ Pain       Pain Assessment: No/denies pain  Home Living                                          Prior Functioning/Environment              Frequency           Progress Toward Goals  OT Goals(current goals can now be found in the care plan section)  Progress towards OT goals: Goals met/education completed, patient discharged from OT (continue wtih [place])  Acute Rehab OT Goals Patient Stated Goal: to be independent OT Goal Formulation: All assessment and education complete, DC  therapy ADL Goals Additional ADL Goal #1: Pt will independently verbalize 3 strategies to compensate for low vision Additional ADL Goal #2: Pt will independently verbalize 3 strateiges to reduce risk of falls  Plan Discharge plan remains appropriate    Co-evaluation                 AM-PAC OT "6 Clicks" Daily Activity     Outcome Measure   Help from another person eating meals?: None Help from another person taking care of personal grooming?: None Help from another person toileting, which includes using toliet, bedpan, or urinal?: None Help from another person bathing (including washing, rinsing, drying)?: None Help from another person to put on and taking off regular upper body clothing?: None Help from another person to put on and taking off regular lower body clothing?: None 6 Click Score: 24    End of Session    OT Visit Diagnosis: Low vision, both eyes (H54.2)   Activity Tolerance Patient tolerated treatment well   Patient Left in bed;with call bell/phone within reach   Nurse Communication Mobility status;Other (comment) (need for follow up)        Time: 1030-1052 OT Time Calculation (min): 22 min  Charges: OT General Charges $OT Visit: 1 Visit OT Treatments $Self Care/Home  Management : 8-22 mins  Maurie Boettcher, OT/L   Acute OT Clinical Specialist Acute Rehabilitation Services Pager [phone] Office [phone]    [facility] 10/21/2020, 12:56 PM

## 2020-10-23 ENCOUNTER — Encounter: Payer: Self-pay | Admitting: Family Medicine

## 2020-10-23 ENCOUNTER — Inpatient Hospital Stay: Payer: Medicare HMO

## 2020-10-23 LAB — H. PYLORI ANTIGEN, STOOL: H. Pylori Stool Ag, Eia: NEGATIVE

## 2020-10-25 NOTE — Progress Notes (Signed)
    SUBJECTIVE:   CHIEF COMPLAINT / HPI:   H. Pylori s/p perforated PUD: H. Pylori negative. start PPI. Recommend patient to follow up with GI for colonoscopy, recommend GI follow up.  HTN: started on amlodipine in the hospital. BP today close to goal at 140/70. He reports checking it recently and <130/80. Recommend follow up in 1 month for repeat BP check.  Pre-diabetes: A1c 5.9% on 10/21/20. Counseled on diet and exercise.   Recent CRAO: Worked up in hospital for risk factors for stroke. ASCVD risk 30%. Recent lipid panel with total cholesterol 203, HDL 62, LDL 102, Trig 49. Patient on plavix x3 weeks, ASA, atorvastatin 80 mg. He still lacks most sight in Right eye, cannot see color or detail, only movement, make out shapes, blurry. He saw Dr. Jule Ser, ophthalmologist, today and was prescribed medication.  PERTINENT  PMH / PSH: CRAO, pre-diabetes, polysubstance use, HTN, HLD  OBJECTIVE:   BP (!) 142/70   Pulse 91   Ht 5\' 11"  (1.803 m)   Wt 143 lb (64.9 kg)   SpO2 98%   BMI 19.94 kg/m   Nursing note and vitals reviewed GEN: age-appropriate, AAM, resting comfortably in chair, NAD, WNWD HEENT: NCAT. PERRLA. Sclera without injection or icterus. MMM.  Cardiac: Regular rate and rhythm. Normal S1/S2. No murmurs, rubs, or gallops appreciated. 2+ radial pulses. Lungs: Clear bilaterally to ascultation. No increased WOB, no accessory muscle usage. No w/r/r.  Neuro: AOx3  Ext: no edema Psych: Pleasant and appropriate ASSESSMENT/PLAN:   Central retinal artery occlusion Continue risk factor lowering medication including DAPT: clopidogrel x3 weeks (to stop March 28), ASA to continue, continue atorvastatin 80 mg.  - recheck direct LDL at next visit to ensure <goal of 50   H. pylori infection H. Pylori stool ag negative. PPI started.  Hypertension Close to goal of <130/80. Currently on amlodipine 5 mg. Reports recent check <130/80. Recommend follow up visit in 1 month to recheck  BP.   Healthcare maintenance Patient has never had colonoscopy and was recommended to do so after perforated gastric ulcer 2/2 h. Pylori infection in 2021. He was previously seen at [facility] gastroenterology by Dr. Carlean Purl. Given number to make appointment, also gave patient number for [facility] transportation as he has difficulty with transport.     Gladys Damme, MD [place]

## 2020-10-26 ENCOUNTER — Telehealth: Payer: Self-pay

## 2020-10-26 ENCOUNTER — Other Ambulatory Visit: Payer: Self-pay

## 2020-10-26 ENCOUNTER — Encounter: Payer: Self-pay | Admitting: Family Medicine

## 2020-10-26 ENCOUNTER — Ambulatory Visit (INDEPENDENT_AMBULATORY_CARE_PROVIDER_SITE_OTHER): Payer: Medicare HMO | Admitting: Family Medicine

## 2020-10-26 DIAGNOSIS — H3411 Central retinal artery occlusion, right eye: Secondary | ICD-10-CM

## 2020-10-26 DIAGNOSIS — I10 Essential (primary) hypertension: Secondary | ICD-10-CM | POA: Diagnosis not present

## 2020-10-26 DIAGNOSIS — A048 Other specified bacterial intestinal infections: Secondary | ICD-10-CM | POA: Diagnosis not present

## 2020-10-26 DIAGNOSIS — Z0000 Encounter for general adult medical examination without abnormal findings: Secondary | ICD-10-CM

## 2020-10-26 NOTE — Assessment & Plan Note (Signed)
Patient has never had colonoscopy and was recommended to do so after perforated gastric ulcer 2/2 h. Pylori infection in 2021. He was previously seen at [facility] gastroenterology by Dr. Carlean Purl. Given number to make appointment, also gave patient number for [facility] transportation as he has difficulty with transport.

## 2020-10-26 NOTE — Assessment & Plan Note (Signed)
H. Pylori stool ag negative. PPI started.

## 2020-10-26 NOTE — Assessment & Plan Note (Signed)
Close to goal of <130/80. Currently on amlodipine 5 mg. Reports recent check <130/80. Recommend follow up visit in 1 month to recheck BP.

## 2020-10-26 NOTE — Assessment & Plan Note (Signed)
Continue risk factor lowering medication including DAPT: clopidogrel x3 weeks (to stop March 28), ASA to continue, continue atorvastatin 80 mg.  - recheck direct LDL at next visit to ensure <goal of 50

## 2020-10-26 NOTE — Patient Instructions (Addendum)
It was a pleasure to see you today!  1. Stop taking PLAVIX (clopidogrel) on March 28  2. Continue taking amlodipine, aspirin, lipitor, and protonix.  3. Follow up with Dr. Nita Sells April 11 at 8:45 AM  4. Recommend you follow up with [facility] gastroenterology at [phone] to have a colonoscopy with Dr. Carlean Purl. The location is [place], [place]  5. Transportation number for [facility] services [phone]. Call to get a ride to any [facility] appointment.   Be Well,  Dr. Chauncey Reading

## 2020-10-26 NOTE — Telephone Encounter (Signed)
[facility] calls nurse line to report the patient is a non admit for [facility] PT. Erlene Quan did his evaluation today and the patient does not need any further [facility] PT services.

## 2020-11-23 ENCOUNTER — Ambulatory Visit: Payer: Medicare HMO | Admitting: Family Medicine

## 2020-12-15 ENCOUNTER — Ambulatory Visit: Payer: Medicare HMO | Admitting: Family Medicine

## 2021-01-20 NOTE — Progress Notes (Signed)
    SUBJECTIVE:   CHIEF COMPLAINT / HPI: HTN f/u  HTN: BP today is WNL, 128/80. Patient is not using amlodipine 5 mg qd, has not taken it in several months.  He reports no symptoms.  HLD and CRAO: Reports sight is still limited in right eye, he has not been to see his ophthalmologist in the last 2 months. S/p plavix x3 weeks, he was prescribed to continue on ASA 81 mg qd and atorvastatin 80 mg qd thereafter, but reports he has not been taking these medications in the last month.  Anemia: patient has h/o hgb 11.1 at hospitalization in March. He does need colonoscopy, reminded patient of this and gave him contact information for Dr. Carlean Purl at [place] (who he had been previously referred to). Patient reports he will call to make appt. He denies any BRBPR. Also since he has not been on ASA in last month, will check CBC today to evaluate hgb.  Right groin lump: patient has noted a lump that comes and goes just above his right groin. He denies pain, nausea, or vomiting. He can easily reduce this lump. This has been happening for a couple of months. He has no problems urinating, no sick symptoms.   PERTINENT  PMH / PSH: HTN, central retinal artery occlusion  OBJECTIVE:   BP 128/80   Pulse 93   Wt 138 lb (62.6 kg)   SpO2 94%   BMI 19.25 kg/m   Nursing note and vitals reviewed GEN: senior AAM, resting comfortably in chair, NAD, WNWD HEENT: NCAT. PERRLA. Sclera without injection or icterus. MMM.  Cardiac: Regular rate and rhythm. Normal S1/S2. No murmurs, rubs, or gallops appreciated. 2+ radial pulses. Lungs: Clear bilaterally to ascultation. No increased WOB, no accessory muscle usage. No w/r/r. Right groin: 3cm soft swelling palpated in right inguinal canal, easily reduced. Approx 1 cm defect easily palpated in inguinal canal.  Ext: no edema Psych: Pleasant and appropriate   ASSESSMENT/PLAN:   Central retinal artery occlusion Patient with recent h/o CRAO, elevated ASCVD risk of 36%.  Counseled again on importance of prevention of recurrent episodes. Prescribed ASA 81 mg and Atorvastatin 80 mg qd. Follow up 1 month. Recommend patient follow up with ophthalmology.   Hypertension Controlled today and asymptomatic, despite not using amlodipine. Counseled on red flag symptoms, follow up in 1 month to recheck.  Anemia Last hgb 11.1 in April. Will recheck today. Reminded patient to contact [facility] as he needs colonoscopy screening given age and anemia.   Inguinal hernia of right side without obstruction or gangrene Patient has non-obstructing inguinal hernia, 1cm defect in inguinal canal easily palpated. Counseled on red flags for obstruction/gangrene. He is not interested in surgery referral at this time, but we discussed if he has recurrent pain or any obstruction/gangrene, that will be next step. I am happy to send a referral whenever he chooses.     Gladys Damme, MD [place]

## 2021-01-20 NOTE — Patient Instructions (Addendum)
It was a pleasure to see you today!  For your high blood pressure: please follow up with me in 1 month. If you have headaches, feel dizzy, change in your vision, please call us [phone] to be seen earlier.  2. For your risk of stroke and heart attack: please take aspirin 81 mg daily and atorvastatin 80 mg daily. This will help prevent another event like the one that caused your eyesight loss.  3. Follow up in 1 month with me  4. Call Dr. Celesta Aver office (bowel specialist) [facility] to get a colonoscopy: Address: [address], [place], [place] [address] Phone: [phone]  5. Inguinal Hernia: the lump in your groin is what we call a hernia. If you have severe pain, cannot make the lump easily go down, nausea/vomiting, please come to the emergency department ASAP for evaluation.  If you have minor pain that is frequently worse, you can consider getting surgery.  Be Well,  Dr. Chauncey Reading  Inguinal Hernia, Adult An inguinal hernia is when fat or your intestines push through a weak spot in a muscle where your leg meets your lower belly (groin). This causes a bulge. This kind of hernia could also be: In your scrotum, if you are male. In folds of skin around your vagina, if you are male. There are three types of inguinal hernias: Hernias that can be pushed back into the belly (are reducible). This type rarely causes pain. Hernias that cannot be pushed back into the belly (are incarcerated). Hernias that cannot be pushed back into the belly and lose their blood supply (are strangulated). This type needs emergency surgery. What are the causes? This condition is caused by having a weak spot in the muscles or tissues in your groin. This develops over time. The hernia may poke through the weak spot when you strain your lower belly muscles all of a sudden, such as when you: Lift a heavy object. Strain to poop (have a bowel movement). Trouble pooping (constipation) can  lead to straining. Cough. What increases the risk? This condition is more likely to develop in: Males. Pregnant females. People who: Are overweight. Work in jobs that require long periods of standing or heavy lifting. Have had an inguinal hernia before. Smoke or have lung disease. These factors can lead to long-term (chronic) coughing. What are the signs or symptoms? Symptoms may depend on the size of the hernia. Often, a small hernia has no symptoms. Symptoms of a larger hernia may include: A bulge in the groin area. This is easier to see when standing. You might not be able to see it when you are lying down. Pain or burning in the groin. This may get worse when you lift, strain, or cough. A dull ache or a feeling of pressure in the groin. An abnormal bulge in the scrotum, in males. Symptoms of a strangulated inguinal hernia may include: A bulge in your groin that is very painful and tender to the touch. A bulge that turns red or purple. Fever, feeling like you may vomit (nausea), and vomiting. Not being able to poop or to pass gas. How is this treated? Treatment depends on the size of your hernia and whether you have symptoms. If you do not have symptoms, your doctor may have you watch your hernia carefully and have you come in for follow-up visits. If your hernia is large or if you have symptoms, you may need surgery to repair the hernia. Follow these instructions at  home: Lifestyle Avoid lifting heavy objects. Avoid standing for long amounts of time. Do not smoke or use any products that contain nicotine or tobacco. If you need help quitting, ask your doctor. Stay at a healthy weight. Prevent trouble pooping You may need to take these actions to prevent or treat trouble pooping: Drink enough fluid to keep your pee (urine) pale yellow. Take over-the-counter or prescription medicines. Eat foods that are high in fiber. These include beans, whole grains, and fresh fruits and  vegetables. Limit foods that are high in fat and sugar. These include fried or sweet foods. General instructions You may try to push your hernia back in place by very gently pressing on it when you are lying down. Do not try to push the bulge back in if it will not go in easily. Watch your hernia for any changes in shape, size, or color. Tell your doctor if you see any changes. Take over-the-counter and prescription medicines only as told by your doctor. Keep all follow-up visits. Contact a doctor if: You have a fever or chills. You have new symptoms. Your symptoms get worse. Get help right away if: You have pain in your groin that gets worse all of a sudden. You have a bulge in your groin that: Gets bigger all of a sudden, and it does not get smaller after that. Turns red or purple. Is painful when you touch it. You are a male, and you have: Sudden pain in your scrotum. A sudden change in the size of your scrotum. You cannot push the hernia back in place by very gently pressing on it when you are lying down. You feel like you may vomit, and that feeling does not go away. You keep vomiting. You have a fast heartbeat. You cannot poop or pass gas. These symptoms may be an emergency. Get help right away. Call your local emergency services (911 in the [place] Do not wait to see if the symptoms will go away. Do not drive yourself to the hospital. Summary An inguinal hernia is when fat or your intestines push through a weak spot in a muscle where your leg meets your lower belly (groin). This causes a bulge. If you do not have symptoms, you may not need treatment. If you have symptoms or a large hernia, you may need surgery. Avoid lifting heavy objects. Also, avoid standing for long amounts of time. Do not try to push the bulge back in if it will not go in easily. This information is not intended to replace advice given to you by your health care provider. Make sure you discuss any questions  you have with your health care provider. Document Revised: 03/31/2020 Document Reviewed: 03/31/2020 Elsevier Patient Education  2021 [employer].

## 2021-01-21 ENCOUNTER — Other Ambulatory Visit: Payer: Self-pay

## 2021-01-21 ENCOUNTER — Encounter: Payer: Self-pay | Admitting: Family Medicine

## 2021-01-21 ENCOUNTER — Ambulatory Visit (INDEPENDENT_AMBULATORY_CARE_PROVIDER_SITE_OTHER): Payer: Medicare Other | Admitting: Family Medicine

## 2021-01-21 VITALS — BP 128/80 | HR 93 | Wt 138.0 lb

## 2021-01-21 DIAGNOSIS — I10 Essential (primary) hypertension: Secondary | ICD-10-CM

## 2021-01-21 DIAGNOSIS — H3411 Central retinal artery occlusion, right eye: Secondary | ICD-10-CM

## 2021-01-21 DIAGNOSIS — K4090 Unilateral inguinal hernia, without obstruction or gangrene, not specified as recurrent: Secondary | ICD-10-CM | POA: Insufficient documentation

## 2021-01-21 DIAGNOSIS — D649 Anemia, unspecified: Secondary | ICD-10-CM | POA: Diagnosis not present

## 2021-01-21 MED ORDER — ATORVASTATIN CALCIUM 80 MG PO TABS
1.0000 | ORAL_TABLET | Freq: Every day | ORAL | 3 refills | Status: DC
Start: 2021-01-21 — End: 2021-06-15

## 2021-01-21 MED ORDER — ASPIRIN 81 MG PO CHEW
81.0000 mg | CHEWABLE_TABLET | Freq: Every day | ORAL | 3 refills | Status: DC
Start: 2021-01-21 — End: 2021-06-30

## 2021-01-21 NOTE — Assessment & Plan Note (Signed)
Controlled today and asymptomatic, despite not using amlodipine. Counseled on red flag symptoms, follow up in 1 month to recheck.

## 2021-01-21 NOTE — Assessment & Plan Note (Signed)
Patient has non-obstructing inguinal hernia, 1cm defect in inguinal canal easily palpated. Counseled on red flags for obstruction/gangrene. He is not interested in surgery referral at this time, but we discussed if he has recurrent pain or any obstruction/gangrene, that will be next step. I am happy to send a referral whenever he chooses.

## 2021-01-21 NOTE — Assessment & Plan Note (Signed)
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 Counseled again on importance of prevention of recurrent episodes. Prescribed ASA 81 mg and Atorvastatin 80 mg qd. Follow up 1 month. Recommend patient follow up with ophthalmology.

## 2021-01-21 NOTE — Assessment & Plan Note (Addendum)
Last hgb 11.1 in April. Will recheck today. Reminded patient to contact [facility] as he needs colonoscopy screening given age and anemia.

## 2021-01-22 LAB — CBC WITH DIFFERENTIAL/PLATELET
Basophils Absolute: 0 10*3/uL (ref 0.0–0.2)
Basos: 0 %
EOS (ABSOLUTE): 0.2 10*3/uL (ref 0.0–0.4)
Eos: 2 %
Hematocrit: 33 % — ABNORMAL LOW (ref 37.5–51.0)
Hemoglobin: 10.1 g/dL — ABNORMAL LOW (ref 13.0–17.7)
Immature Grans (Abs): 0 10*3/uL (ref 0.0–0.1)
Immature Granulocytes: 0 %
Lymphocytes Absolute: 2.4 10*3/uL (ref 0.7–3.1)
Lymphs: 26 %
MCH: 21.6 pg — ABNORMAL LOW (ref 26.6–33.0)
MCHC: 30.6 g/dL — ABNORMAL LOW (ref 31.5–35.7)
MCV: 71 fL — ABNORMAL LOW (ref 79–97)
Monocytes Absolute: 0.8 10*3/uL (ref 0.1–0.9)
Monocytes: 9 %
Neutrophils Absolute: 5.7 10*3/uL (ref 1.4–7.0)
Neutrophils: 63 %
Platelets: 340 10*3/uL (ref 150–450)
RBC: 4.67 x10E6/uL (ref 4.14–5.80)
RDW: 14.5 % (ref 11.6–15.4)
WBC: 9.1 10*3/uL (ref 3.4–10.8)

## 2021-01-27 ENCOUNTER — Telehealth: Payer: Self-pay | Admitting: Family Medicine

## 2021-01-27 DIAGNOSIS — D509 Iron deficiency anemia, unspecified: Secondary | ICD-10-CM

## 2021-01-27 MED ORDER — FERROUS SULFATE 325 (65 FE) MG PO TABS
325.0000 mg | ORAL_TABLET | Freq: Every day | ORAL | 3 refills | Status: DC
Start: 2021-01-27 — End: 2021-05-12

## 2021-01-27 NOTE — Telephone Encounter (Signed)
Called pt and discussed CBC results. He continues to have anemia, despite not being on any anticoagulation. Appears to be microcytic, likely iron deficiency anemia. Counseled on iron rich foods and I prescribed daily oral iron supplementation, which he is interested in using. 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 Patient has active referral in with [facility], gave office number. He will follow up with me on 02/17/21.  Gladys Damme, MD [facility], PGY-2

## 2021-02-05 ENCOUNTER — Ambulatory Visit: Payer: Medicare Other | Admitting: Family Medicine

## 2021-02-17 ENCOUNTER — Ambulatory Visit: Payer: Medicare Other | Admitting: Family Medicine

## 2021-03-03 ENCOUNTER — Other Ambulatory Visit: Payer: Self-pay

## 2021-03-03 ENCOUNTER — Ambulatory Visit (HOSPITAL_COMMUNITY)
Admission: RE | Admit: 2021-03-03 | Discharge: 2021-03-03 | Disposition: A | Discharge status: Home / Self Care | Payer: Medicare Other | Source: Ambulatory Visit | Attending: Family Medicine | Admitting: Family Medicine

## 2021-03-03 ENCOUNTER — Ambulatory Visit (INDEPENDENT_AMBULATORY_CARE_PROVIDER_SITE_OTHER): Payer: Medicare Other | Admitting: Family Medicine

## 2021-03-03 ENCOUNTER — Encounter: Payer: Self-pay | Admitting: Family Medicine

## 2021-03-03 VITALS — BP 131/82 | HR 117 | Ht 71.0 in | Wt 135.2 lb

## 2021-03-03 DIAGNOSIS — H3411 Central retinal artery occlusion, right eye: Secondary | ICD-10-CM

## 2021-03-03 DIAGNOSIS — R000 Tachycardia, unspecified: Secondary | ICD-10-CM | POA: Insufficient documentation

## 2021-03-03 DIAGNOSIS — D649 Anemia, unspecified: Secondary | ICD-10-CM

## 2021-03-03 DIAGNOSIS — I10 Essential (primary) hypertension: Secondary | ICD-10-CM | POA: Diagnosis not present

## 2021-03-03 NOTE — Assessment & Plan Note (Signed)
Chronic, controlled.  Continue current regimen amlodipine 5.  No labs needed at this time.

## 2021-03-03 NOTE — Assessment & Plan Note (Signed)
Patient taking his atorvastatin.  He has upcoming appointment with new ophthalmologist.  No change in vision

## 2021-03-03 NOTE — Patient Instructions (Addendum)
It was a pleasure to see you today!  GREAT WORK with taking your medication and getting a follow up scheduled with GI! Fast heart rate: I recommend drinking about a liter of water on hot days.  Follow up in 3 months for next visit Make an appointment to follow with an eye doctor when you can  Be Well,  Dr. Chauncey Reading

## 2021-03-03 NOTE — Assessment & Plan Note (Addendum)
Patient taking iron with side effects of constipation and dark stools.  Gave reassurance; recommend MiraLAX and titrate to desired effect.  Recheck CBC in 3 months.  Patient had tachycardia, asymptomatic.  Had caffeine this morning without anything else.  EKG shows sinus tachycardia.  Could be related to anemia.  We will continue to follow.  Upcoming colonoscopy.

## 2021-03-03 NOTE — Progress Notes (Signed)
    SUBJECTIVE:   CHIEF COMPLAINT / HPI: BP follow up  HTN: BP at goal today 131/82. Current regimen includes amlodipine 5 mg. He reports compliance. Asymptomatic.  HLD and CRAO: patient reports that he is taking the atorvastatin.  He has an appointment with his ophthalmologist upcoming in September.  Anemia: Patient reports taking iron and having some dark stools as well as constipation. Will recheck CBC in 2 more months.  Cardiac to 117, repeat 110.  GI: Patient scheduled for colonoscopy with GI in September  PERTINENT  PMH / PSH: HLD, HTN, CRAO  OBJECTIVE:   BP 131/82   Pulse (!) 117   Ht 5\' 11"  (1.803 m)   Wt 135 lb 3.2 oz (61.3 kg)   SpO2 98%   BMI 18.86 kg/m   Nursing note and vitals reviewed GEN: Age-appropriate, AAM, resting comfortably in chair, NAD, WNWD, and at baseline Cardiac: Regular rate and rhythm. Normal S1/S2. No murmurs, rubs, or gallops appreciated. 2+ radial pulses. Lungs: Clear bilaterally to ascultation. No increased WOB, no accessory muscle usage. No w/r/r. Ext: no edema Psych: Pleasant and appropriate   ASSESSMENT/PLAN:   Anemia Patient taking iron with side effects of constipation and dark stools.  Gave reassurance; recommend MiraLAX and titrate to desired effect.  Recheck CBC in 3 months.  Patient had tachycardia, asymptomatic.  Had caffeine this morning without anything else.  EKG shows sinus tachycardia.  Could be related to anemia.  We will continue to follow.  Upcoming colonoscopy.  Hypertension Chronic, controlled.  Continue current regimen amlodipine 5.  No labs needed at this time.  Central retinal artery occlusion, right Patient taking his atorvastatin.  He has upcoming appointment with new ophthalmologist.  No change in vision     Gladys Damme, MD [place]

## 2021-04-21 NOTE — Progress Notes (Signed)
Monitor never was applied. Order will be cancelled

## 2021-05-12 ENCOUNTER — Other Ambulatory Visit (INDEPENDENT_AMBULATORY_CARE_PROVIDER_SITE_OTHER): Payer: Medicare Other

## 2021-05-12 ENCOUNTER — Encounter: Payer: Self-pay | Admitting: Internal Medicine

## 2021-05-12 ENCOUNTER — Ambulatory Visit: Payer: Medicare Other | Admitting: Internal Medicine

## 2021-05-12 VITALS — BP 122/72 | HR 111 | Ht 71.0 in | Wt 134.4 lb

## 2021-05-12 DIAGNOSIS — K255 Chronic or unspecified gastric ulcer with perforation: Secondary | ICD-10-CM | POA: Diagnosis not present

## 2021-05-12 DIAGNOSIS — R933 Abnormal findings on diagnostic imaging of other parts of digestive tract: Secondary | ICD-10-CM

## 2021-05-12 DIAGNOSIS — D509 Iron deficiency anemia, unspecified: Secondary | ICD-10-CM

## 2021-05-12 DIAGNOSIS — K4090 Unilateral inguinal hernia, without obstruction or gangrene, not specified as recurrent: Secondary | ICD-10-CM

## 2021-05-12 DIAGNOSIS — R768 Other specified abnormal immunological findings in serum: Secondary | ICD-10-CM

## 2021-05-12 LAB — CBC WITH DIFFERENTIAL/PLATELET
Basophils Absolute: 0.1 10*3/uL (ref 0.0–0.1)
Basophils Relative: 0.8 % (ref 0.0–3.0)
Eosinophils Absolute: 0.2 10*3/uL (ref 0.0–0.7)
Eosinophils Relative: 2.2 % (ref 0.0–5.0)
HCT: 33.5 % — ABNORMAL LOW (ref 39.0–52.0)
Hemoglobin: 10.3 g/dL — ABNORMAL LOW (ref 13.0–17.0)
Lymphocytes Relative: 18.6 % (ref 12.0–46.0)
Lymphs Abs: 1.8 10*3/uL (ref 0.7–4.0)
MCHC: 30.8 g/dL (ref 30.0–36.0)
MCV: 68.5 fl — ABNORMAL LOW (ref 78.0–100.0)
Monocytes Absolute: 0.8 10*3/uL (ref 0.1–1.0)
Monocytes Relative: 8.1 % (ref 3.0–12.0)
Neutro Abs: 6.7 10*3/uL (ref 1.4–7.7)
Neutrophils Relative %: 70.3 % (ref 43.0–77.0)
Platelets: 335 10*3/uL (ref 150.0–400.0)
RBC: 4.89 Mil/uL (ref 4.22–5.81)
RDW: 16.8 % — ABNORMAL HIGH (ref 11.5–15.5)
WBC: 9.5 10*3/uL (ref 4.0–10.5)

## 2021-05-12 LAB — VITAMIN B12: Vitamin B-12: 291 pg/mL (ref 211–911)

## 2021-05-12 LAB — FERRITIN: Ferritin: 14.9 ng/mL — ABNORMAL LOW (ref 22.0–322.0)

## 2021-05-12 MED ORDER — FERROUS SULFATE 325 (65 FE) MG PO TBEC: 325.0000 mg | DELAYED_RELEASE_TABLET | Freq: Every day | ORAL | 3 refills | Status: DC

## 2021-05-12 MED ORDER — NA SULFATE-K SULFATE-MG SULF 17.5-3.13-1.6 GM/177ML PO SOLN: 1.0000 | Freq: Once | ORAL | 0 refills | Status: AC

## 2021-05-12 NOTE — Patient Instructions (Signed)
You have been scheduled for an endoscopy and colonoscopy. Please follow the written instructions given to you at your visit today. Please pick up your prep supplies at the pharmacy within the next 1-3 days. If you use inhalers (even only as needed), please bring them with you on the day of your procedure.   Your provider has requested that you go to the basement level for lab work before leaving today. Press "B" on the elevator. The lab is located at the first door on the left as you exit the elevator.  I appreciate the opportunity to care for you. Silvano Rusk, MD, [facility]

## 2021-05-12 NOTE — Progress Notes (Signed)
Albert Love 80 y.o. 17-May-1941 [id number]  Assessment & Plan:   Encounter Diagnoses  Name Primary?   Gastric ulcer with perforation, unspecified chronicity (HCC) Yes   Abnormal CT scan, colon - cecum    Helicobacter pylori antibody positive    Microcytic anemia    Right inguinal hernia     He needs an EGD with biopsies to see if H. pylori is eradicated and a colonoscopy.  Colonoscopy to evaluate abnormal cecum and iron deficiency anemia as is the EGD (iron deficiency anemia) plus the history of gastric ulcer.  We will repeat a CBC and check ferritin and B12 for completeness. Start ferrous sulfate.  325 mg daily. I explained that he has a right inguinal hernia.  Handout provided.  The risks and benefits as well as alternatives of endoscopic procedure(s) have been discussed and reviewed. All questions answered. The patient agrees to proceed.     Subjective:   Chief Complaint: Right lower quadrant "bump", schedule colonoscopy  HPI 80 year old man originally from [place] who I saw last year in May and scheduled for EGD and colonoscopy to evaluate previous gastric ulcer with H. pylori and an abnormal cecum on CT.  He canceled his appointments.  He was seen in primary care earlier this year and told to call for an appointment to come back because he had iron deficiency anemia numbers i.e. microcytic anemia and did not have his procedures.  He was told to take oral iron I do not think he is.  He has a bulge in his right groin that he shows me that is an inguinal hernia which was known from before.  Appetite okay.  Weight as below.  Some irregular defecation.  Wt Readings from Last 3 Encounters:  05/12/21 134 lb 6 oz (61 kg)  03/03/21 135 lb 3.2 oz (61.3 kg)  01/21/21 138 lb (62.6 kg)     No Known Allergies Current Meds  Medication Sig   aspirin 81 MG chewable tablet Chew 1 tablet (81 mg total) by mouth daily.   atorvastatin (LIPITOR) 80 MG tablet TAKE 1 TABLET (80 MG  TOTAL) BY MOUTH DAILY.   ferrous sulfate 325 (65 FE) MG EC tablet Take 1 tablet (325 mg total) by mouth daily with breakfast.   Past Medical History:  Diagnosis Date   Acute renal insufficiency 09/05/2019   Perforated gastric ulcer s/p omental [name] patch 09/05/2019 09/05/2019   Past Surgical History:  Procedure Laterality Date   BOWEL RESECTION N/A 09/05/2019   Procedure: SMALL BOWEL RESECTION;  Surgeon: Clovis Riley, MD;  Location: [facility];  Service: General;  Laterality: N/A;   IR SINUS/FIST TUBE CHK-NON GI  09/19/2019   IR SINUS/FIST TUBE CHK-NON GI  09/19/2019   LAPAROTOMY N/A 09/05/2019   Procedure: EXPLORATORY LAPAROTOMY WITH [facility] AND GASTRIC BIOPSY;  Surgeon: Clovis Riley, MD;  Location: [facility];  Service: General;  Laterality: N/A;   Social History   Social History Narrative   08/2019 moved to [facility] from [place]     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   Review of Systems  As above Objective:   Physical Exam BP 122/72   Pulse (!) 111   Ht 5\' 11"  (1.803 m)   Wt 134 lb 6 oz (61 kg)   BMI 18.74 kg/m  Thin black male in  no acute distress Lungs are clear Heart sounds are normal There is an upper midline scar in the abdomen there is a right direct inguinal hernia that is easily reducible the size of a ping-pong ball abdomen otherwise benign  Data reviewed as above

## 2021-06-05 ENCOUNTER — Inpatient Hospital Stay (HOSPITAL_COMMUNITY)
Admission: EM | Admit: 2021-06-05 | Discharge: 2021-06-15 | DRG: 871 | Disposition: A | Discharge status: Skilled Nursing Facility | Payer: Medicare Other | Attending: Family Medicine | Admitting: Family Medicine

## 2021-06-05 ENCOUNTER — Other Ambulatory Visit: Payer: Self-pay

## 2021-06-05 ENCOUNTER — Encounter (HOSPITAL_COMMUNITY): Payer: Self-pay

## 2021-06-05 DIAGNOSIS — Z8711 Personal history of peptic ulcer disease: Secondary | ICD-10-CM

## 2021-06-05 DIAGNOSIS — A419 Sepsis, unspecified organism: Secondary | ICD-10-CM | POA: Diagnosis not present

## 2021-06-05 DIAGNOSIS — K4090 Unilateral inguinal hernia, without obstruction or gangrene, not specified as recurrent: Secondary | ICD-10-CM | POA: Diagnosis present

## 2021-06-05 DIAGNOSIS — D75839 Thrombocytosis, unspecified: Secondary | ICD-10-CM | POA: Diagnosis present

## 2021-06-05 DIAGNOSIS — R109 Unspecified abdominal pain: Secondary | ICD-10-CM | POA: Diagnosis not present

## 2021-06-05 DIAGNOSIS — K6819 Other retroperitoneal abscess: Secondary | ICD-10-CM | POA: Diagnosis present

## 2021-06-05 DIAGNOSIS — Z681 Body mass index (BMI) 19 or less, adult: Secondary | ICD-10-CM

## 2021-06-05 DIAGNOSIS — L0291 Cutaneous abscess, unspecified: Secondary | ICD-10-CM

## 2021-06-05 DIAGNOSIS — Z20822 Contact with and (suspected) exposure to covid-19: Secondary | ICD-10-CM | POA: Diagnosis not present

## 2021-06-05 DIAGNOSIS — Z66 Do not resuscitate: Secondary | ICD-10-CM | POA: Diagnosis not present

## 2021-06-05 DIAGNOSIS — M8580 Other specified disorders of bone density and structure, unspecified site: Secondary | ICD-10-CM | POA: Diagnosis not present

## 2021-06-05 DIAGNOSIS — E860 Dehydration: Secondary | ICD-10-CM | POA: Diagnosis not present

## 2021-06-05 DIAGNOSIS — Z7189 Other specified counseling: Secondary | ICD-10-CM | POA: Diagnosis not present

## 2021-06-05 DIAGNOSIS — G8929 Other chronic pain: Secondary | ICD-10-CM | POA: Diagnosis not present

## 2021-06-05 DIAGNOSIS — N400 Enlarged prostate without lower urinary tract symptoms: Secondary | ICD-10-CM | POA: Diagnosis present

## 2021-06-05 DIAGNOSIS — R1031 Right lower quadrant pain: Secondary | ICD-10-CM | POA: Diagnosis not present

## 2021-06-05 DIAGNOSIS — R262 Difficulty in walking, not elsewhere classified: Secondary | ICD-10-CM | POA: Diagnosis present

## 2021-06-05 DIAGNOSIS — I10 Essential (primary) hypertension: Secondary | ICD-10-CM | POA: Diagnosis not present

## 2021-06-05 DIAGNOSIS — R54 Age-related physical debility: Secondary | ICD-10-CM | POA: Diagnosis present

## 2021-06-05 DIAGNOSIS — E8809 Other disorders of plasma-protein metabolism, not elsewhere classified: Secondary | ICD-10-CM | POA: Diagnosis not present

## 2021-06-05 DIAGNOSIS — Z7401 Bed confinement status: Secondary | ICD-10-CM

## 2021-06-05 DIAGNOSIS — Z743 Need for continuous supervision: Secondary | ICD-10-CM | POA: Diagnosis not present

## 2021-06-05 DIAGNOSIS — Z8679 Personal history of other diseases of the circulatory system: Secondary | ICD-10-CM

## 2021-06-05 DIAGNOSIS — K5901 Slow transit constipation: Secondary | ICD-10-CM | POA: Diagnosis not present

## 2021-06-05 DIAGNOSIS — C180 Malignant neoplasm of cecum: Secondary | ICD-10-CM | POA: Diagnosis not present

## 2021-06-05 DIAGNOSIS — R066 Hiccough: Secondary | ICD-10-CM | POA: Diagnosis present

## 2021-06-05 DIAGNOSIS — Z79899 Other long term (current) drug therapy: Secondary | ICD-10-CM

## 2021-06-05 DIAGNOSIS — D539 Nutritional anemia, unspecified: Secondary | ICD-10-CM | POA: Diagnosis not present

## 2021-06-05 DIAGNOSIS — R64 Cachexia: Secondary | ICD-10-CM | POA: Diagnosis not present

## 2021-06-05 DIAGNOSIS — R9431 Abnormal electrocardiogram [ECG] [EKG]: Secondary | ICD-10-CM | POA: Diagnosis not present

## 2021-06-05 DIAGNOSIS — K6389 Other specified diseases of intestine: Secondary | ICD-10-CM | POA: Diagnosis not present

## 2021-06-05 DIAGNOSIS — D630 Anemia in neoplastic disease: Secondary | ICD-10-CM | POA: Diagnosis not present

## 2021-06-05 DIAGNOSIS — D490 Neoplasm of unspecified behavior of digestive system: Secondary | ICD-10-CM | POA: Diagnosis not present

## 2021-06-05 DIAGNOSIS — D509 Iron deficiency anemia, unspecified: Secondary | ICD-10-CM | POA: Diagnosis present

## 2021-06-05 DIAGNOSIS — R627 Adult failure to thrive: Secondary | ICD-10-CM | POA: Diagnosis not present

## 2021-06-05 DIAGNOSIS — K5900 Constipation, unspecified: Secondary | ICD-10-CM | POA: Diagnosis not present

## 2021-06-05 DIAGNOSIS — E46 Unspecified protein-calorie malnutrition: Secondary | ICD-10-CM | POA: Diagnosis not present

## 2021-06-05 DIAGNOSIS — Z7982 Long term (current) use of aspirin: Secondary | ICD-10-CM

## 2021-06-05 DIAGNOSIS — R195 Other fecal abnormalities: Secondary | ICD-10-CM | POA: Diagnosis present

## 2021-06-05 DIAGNOSIS — E43 Unspecified severe protein-calorie malnutrition: Secondary | ICD-10-CM | POA: Insufficient documentation

## 2021-06-05 DIAGNOSIS — M8888 Osteitis deformans of other bones: Secondary | ICD-10-CM | POA: Diagnosis not present

## 2021-06-05 DIAGNOSIS — D649 Anemia, unspecified: Secondary | ICD-10-CM | POA: Diagnosis not present

## 2021-06-05 DIAGNOSIS — E876 Hypokalemia: Secondary | ICD-10-CM | POA: Diagnosis present

## 2021-06-05 DIAGNOSIS — K4091 Unilateral inguinal hernia, without obstruction or gangrene, recurrent: Secondary | ICD-10-CM | POA: Diagnosis not present

## 2021-06-05 DIAGNOSIS — C762 Malignant neoplasm of abdomen: Secondary | ICD-10-CM | POA: Diagnosis present

## 2021-06-05 DIAGNOSIS — E7800 Pure hypercholesterolemia, unspecified: Secondary | ICD-10-CM | POA: Diagnosis not present

## 2021-06-05 DIAGNOSIS — Z978 Presence of other specified devices: Secondary | ICD-10-CM | POA: Diagnosis not present

## 2021-06-05 DIAGNOSIS — K651 Peritoneal abscess: Secondary | ICD-10-CM | POA: Diagnosis not present

## 2021-06-05 DIAGNOSIS — Z515 Encounter for palliative care: Secondary | ICD-10-CM

## 2021-06-05 DIAGNOSIS — E785 Hyperlipidemia, unspecified: Secondary | ICD-10-CM | POA: Diagnosis not present

## 2021-06-05 DIAGNOSIS — R5383 Other fatigue: Secondary | ICD-10-CM | POA: Diagnosis not present

## 2021-06-05 DIAGNOSIS — R0902 Hypoxemia: Secondary | ICD-10-CM | POA: Diagnosis not present

## 2021-06-05 DIAGNOSIS — H3411 Central retinal artery occlusion, right eye: Secondary | ICD-10-CM | POA: Diagnosis not present

## 2021-06-05 DIAGNOSIS — F17210 Nicotine dependence, cigarettes, uncomplicated: Secondary | ICD-10-CM | POA: Diagnosis present

## 2021-06-05 DIAGNOSIS — D696 Thrombocytopenia, unspecified: Secondary | ICD-10-CM | POA: Diagnosis present

## 2021-06-05 DIAGNOSIS — R531 Weakness: Secondary | ICD-10-CM | POA: Diagnosis not present

## 2021-06-05 DIAGNOSIS — R404 Transient alteration of awareness: Secondary | ICD-10-CM | POA: Diagnosis not present

## 2021-06-05 HISTORY — DX: Central retinal artery occlusion, unspecified eye: H34.10

## 2021-06-05 NOTE — ED Provider Notes (Signed)
Emergency Medicine Provider Triage Evaluation Note  Albert Love , a 80 y.o. male  was evaluated in triage.  Pt complains of failure to thrive.  He states that about 3 weeks ago he lost his appetite, he has developed worsening pain in his back and his body.  He states that he was on iron supplements and ran out of those.  He denies any fevers or cough.  He does feel generally weak. He notes that he did have a episode of fecal incontinence.  He denies any no trauma.  Review of Systems  Positive: Weakness, incontinence, Loss of appetite, back pain, abdominal pain Negative: Dysuria, fevers  Physical Exam  Ht 5\' 11"  (1.803 m)   Wt 61.7 kg   BMI 18.97 kg/m  Gen:   Awake, no distress   Resp:  Normal effort  MSK:   Moves extremities without difficulty  Other:  RLQ TTP.  Appears chronically ill and underweight.    Medical Decision Making  Medically screening exam initiated at 11:18 PM.  Appropriate orders placed.  Albert Love was informed that the remainder of the evaluation will be completed by another provider, this initial triage assessment does not replace that evaluation, and the importance of remaining in the ED until their evaluation is complete.  Patient has not had a bowel movement in 3 days.  He is also had worsening weakness with poor appetite.  On exam he has right lower quadrant tenderness to palpation, he has a history of a perforated gastric ulcer.  Will obtain chest x-ray, plain abdominal films.  Additionally will obtain labs.    Note: Portions of this report may have been transcribed using voice recognition software. Every effort was made to ensure accuracy; however, inadvertent computerized transcription errors may be present    Albert Love 06/05/21 [id number]    Drenda Freeze, MD 06/06/21 [phone]

## 2021-06-05 NOTE — ED Triage Notes (Signed)
PER EMS: pt from home with c/o increased fatigue, muscles aches, loss of appetite, trouble walking, & incontinence. A&OX4.   BP - 130/82, HR-106, O2-98%, CBG 162

## 2021-06-06 ENCOUNTER — Encounter (HOSPITAL_COMMUNITY): Payer: Self-pay | Admitting: Family Medicine

## 2021-06-06 ENCOUNTER — Emergency Department (HOSPITAL_COMMUNITY): Payer: Medicare Other

## 2021-06-06 DIAGNOSIS — C762 Malignant neoplasm of abdomen: Secondary | ICD-10-CM | POA: Diagnosis present

## 2021-06-06 DIAGNOSIS — Z8711 Personal history of peptic ulcer disease: Secondary | ICD-10-CM | POA: Diagnosis not present

## 2021-06-06 DIAGNOSIS — R109 Unspecified abdominal pain: Secondary | ICD-10-CM | POA: Diagnosis not present

## 2021-06-06 DIAGNOSIS — R627 Adult failure to thrive: Secondary | ICD-10-CM | POA: Diagnosis not present

## 2021-06-06 DIAGNOSIS — K6389 Other specified diseases of intestine: Secondary | ICD-10-CM

## 2021-06-06 DIAGNOSIS — R1031 Right lower quadrant pain: Secondary | ICD-10-CM | POA: Diagnosis not present

## 2021-06-06 DIAGNOSIS — R531 Weakness: Secondary | ICD-10-CM | POA: Diagnosis not present

## 2021-06-06 DIAGNOSIS — R5383 Other fatigue: Secondary | ICD-10-CM | POA: Diagnosis not present

## 2021-06-06 DIAGNOSIS — D490 Neoplasm of unspecified behavior of digestive system: Secondary | ICD-10-CM | POA: Diagnosis not present

## 2021-06-06 LAB — CBC
HCT: 29.8 % — ABNORMAL LOW (ref 39.0–52.0)
Hemoglobin: 9.4 g/dL — ABNORMAL LOW (ref 13.0–17.0)
MCH: 21 pg — ABNORMAL LOW (ref 26.0–34.0)
MCHC: 31.5 g/dL (ref 30.0–36.0)
MCV: 66.5 fL — ABNORMAL LOW (ref 80.0–100.0)
Platelets: 456 10*3/uL — ABNORMAL HIGH (ref 150–400)
RBC: 4.48 MIL/uL (ref 4.22–5.81)
RDW: 15.4 % (ref 11.5–15.5)
WBC: 27.2 10*3/uL — ABNORMAL HIGH (ref 4.0–10.5)
nRBC: 0 % (ref 0.0–0.2)

## 2021-06-06 LAB — GAMMA GT: GGT: 21 U/L (ref 7–50)

## 2021-06-06 LAB — URINALYSIS, ROUTINE W REFLEX MICROSCOPIC
Bilirubin Urine: NEGATIVE
Glucose, UA: NEGATIVE mg/dL
Hgb urine dipstick: NEGATIVE
Ketones, ur: NEGATIVE mg/dL
Leukocytes,Ua: NEGATIVE
Nitrite: NEGATIVE
Protein, ur: 30 mg/dL — AB
Specific Gravity, Urine: 1.035 — ABNORMAL HIGH (ref 1.005–1.030)
pH: 5 (ref 5.0–8.0)

## 2021-06-06 LAB — CBG MONITORING, ED
Glucose-Capillary: 181 mg/dL — ABNORMAL HIGH (ref 70–99)
Glucose-Capillary: 127 mg/dL — ABNORMAL HIGH (ref 70–99)

## 2021-06-06 LAB — COMPREHENSIVE METABOLIC PANEL
ALT: 31 U/L (ref 0–44)
ALT: 28 U/L (ref 0–44)
AST: 40 U/L (ref 15–41)
AST: 44 U/L — ABNORMAL HIGH (ref 15–41)
Albumin: 2.2 g/dL — ABNORMAL LOW (ref 3.5–5.0)
Albumin: 1.9 g/dL — ABNORMAL LOW (ref 3.5–5.0)
Alkaline Phosphatase: 216 U/L — ABNORMAL HIGH (ref 38–126)
Alkaline Phosphatase: 95 U/L (ref 38–126)
Anion gap: 12 (ref 5–15)
Anion gap: 11 (ref 5–15)
BUN: 14 mg/dL (ref 8–23)
BUN: 12 mg/dL (ref 8–23)
CO2: 25 mmol/L (ref 22–32)
CO2: 26 mmol/L (ref 22–32)
Calcium: 8.8 mg/dL — ABNORMAL LOW (ref 8.9–10.3)
Calcium: 8.4 mg/dL — ABNORMAL LOW (ref 8.9–10.3)
Chloride: 96 mmol/L — ABNORMAL LOW (ref 98–111)
Chloride: 99 mmol/L (ref 98–111)
Creatinine, Ser: 0.97 mg/dL (ref 0.61–1.24)
Creatinine, Ser: 0.86 mg/dL (ref 0.61–1.24)
GFR, Estimated: >60 mL/min (ref >60)
GFR, Estimated: >60 mL/min (ref >60)
Glucose, Bld: 163 mg/dL — ABNORMAL HIGH (ref 70–99)
Glucose, Bld: 175 mg/dL — ABNORMAL HIGH (ref 70–99)
Potassium: 3.6 mmol/L (ref 3.5–5.1)
Potassium: 3.5 mmol/L (ref 3.5–5.1)
Sodium: 133 mmol/L — ABNORMAL LOW (ref 135–145)
Sodium: 136 mmol/L (ref 135–145)
Total Bilirubin: 0.8 mg/dL (ref 0.3–1.2)
Total Bilirubin: 0.8 mg/dL (ref 0.3–1.2)
Total Protein: 6.9 g/dL (ref 6.5–8.1)
Total Protein: 6 g/dL — ABNORMAL LOW (ref 6.5–8.1)

## 2021-06-06 LAB — CBC WITH DIFFERENTIAL/PLATELET
Abs Immature Granulocytes: 0.32 10*3/uL — ABNORMAL HIGH (ref 0.00–0.07)
Basophils Absolute: 0.1 10*3/uL (ref 0.0–0.1)
Basophils Relative: 0 %
Eosinophils Absolute: 0 10*3/uL (ref 0.0–0.5)
Eosinophils Relative: 0 %
HCT: 32.5 % — ABNORMAL LOW (ref 39.0–52.0)
Hemoglobin: 10.1 g/dL — ABNORMAL LOW (ref 13.0–17.0)
Immature Granulocytes: 1 %
Lymphocytes Relative: 4 %
Lymphs Abs: 1.3 10*3/uL (ref 0.7–4.0)
MCH: 21 pg — ABNORMAL LOW (ref 26.0–34.0)
MCHC: 31.1 g/dL (ref 30.0–36.0)
MCV: 67.6 fL — ABNORMAL LOW (ref 80.0–100.0)
Monocytes Absolute: 1.7 10*3/uL — ABNORMAL HIGH (ref 0.1–1.0)
Monocytes Relative: 6 %
Neutro Abs: 27.1 10*3/uL — ABNORMAL HIGH (ref 1.7–7.7)
Neutrophils Relative %: 89 %
Platelets: 577 10*3/uL — ABNORMAL HIGH (ref 150–400)
RBC: 4.81 MIL/uL (ref 4.22–5.81)
RDW: 15.4 % (ref 11.5–15.5)
Schistocytes: NONE SEEN
WBC: 30.5 10*3/uL — ABNORMAL HIGH (ref 4.0–10.5)
nRBC: 0 % (ref 0.0–0.2)

## 2021-06-06 LAB — TROPONIN I (HIGH SENSITIVITY)
Troponin I (High Sensitivity): 10 ng/L (ref <18)
Troponin I (High Sensitivity): 15 ng/L (ref <18)

## 2021-06-06 LAB — GLUCOSE, CAPILLARY: Glucose-Capillary: 149 mg/dL — ABNORMAL HIGH (ref 70–99)

## 2021-06-06 LAB — MAGNESIUM: Magnesium: 1.8 mg/dL (ref 1.7–2.4)

## 2021-06-06 LAB — RESP PANEL BY RT-PCR (FLU A&B, COVID) ARPGX2
Influenza A by PCR: NEGATIVE
Influenza B by PCR: NEGATIVE
SARS Coronavirus 2 by RT PCR: NEGATIVE

## 2021-06-06 LAB — CK: Total CK: 201 U/L (ref 49–397)

## 2021-06-06 LAB — LIPASE, BLOOD: Lipase: 23 U/L (ref 11–51)

## 2021-06-06 IMAGING — DX DG ABDOMEN ACUTE W/ 1V CHEST
4 series · 4 of 4 positions shown · non-contrast
Comparison: Abdominopelvic CT and chest radiograph [DATE]

CLINICAL DATA: Right lower quadrant abdominal pain. Fatigue.
Weakness. History of perforated gastric ulcer.

EXAM:
DG ABDOMEN ACUTE WITH 1 VIEW CHEST

[abdomen supine (1 of 2)]
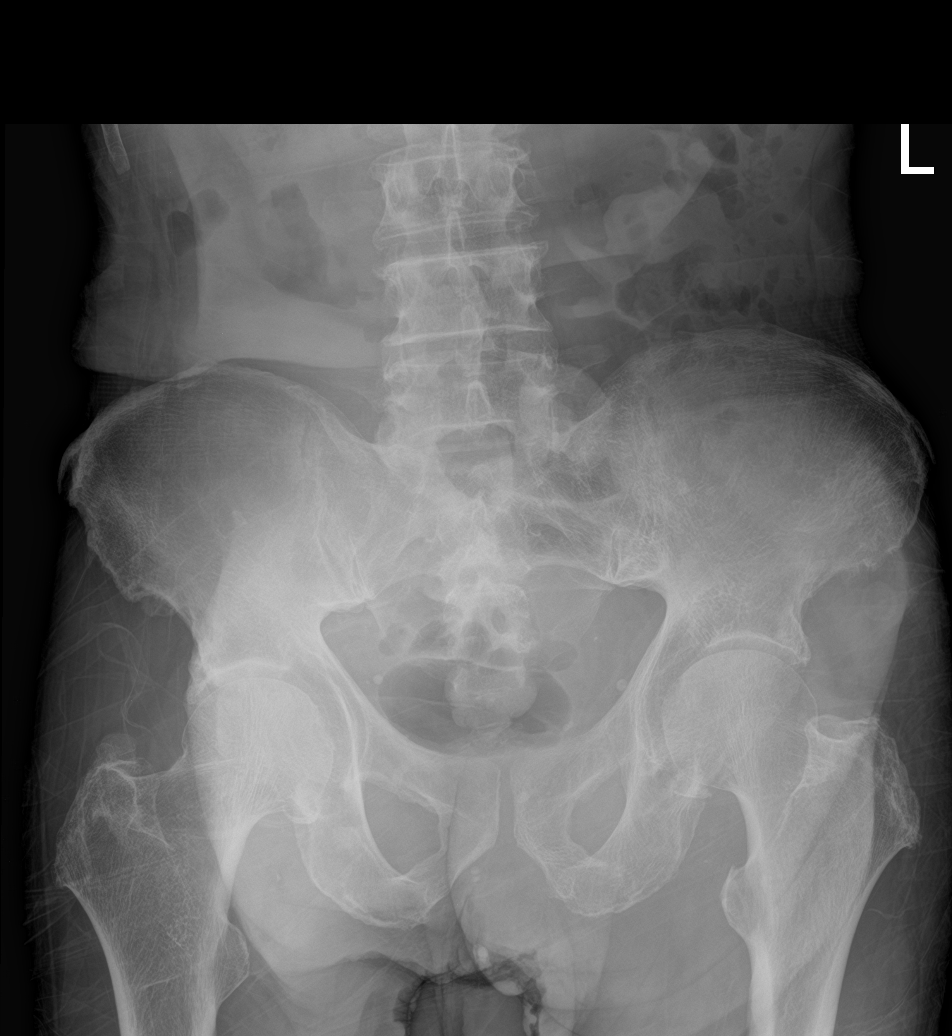

[chest ap]
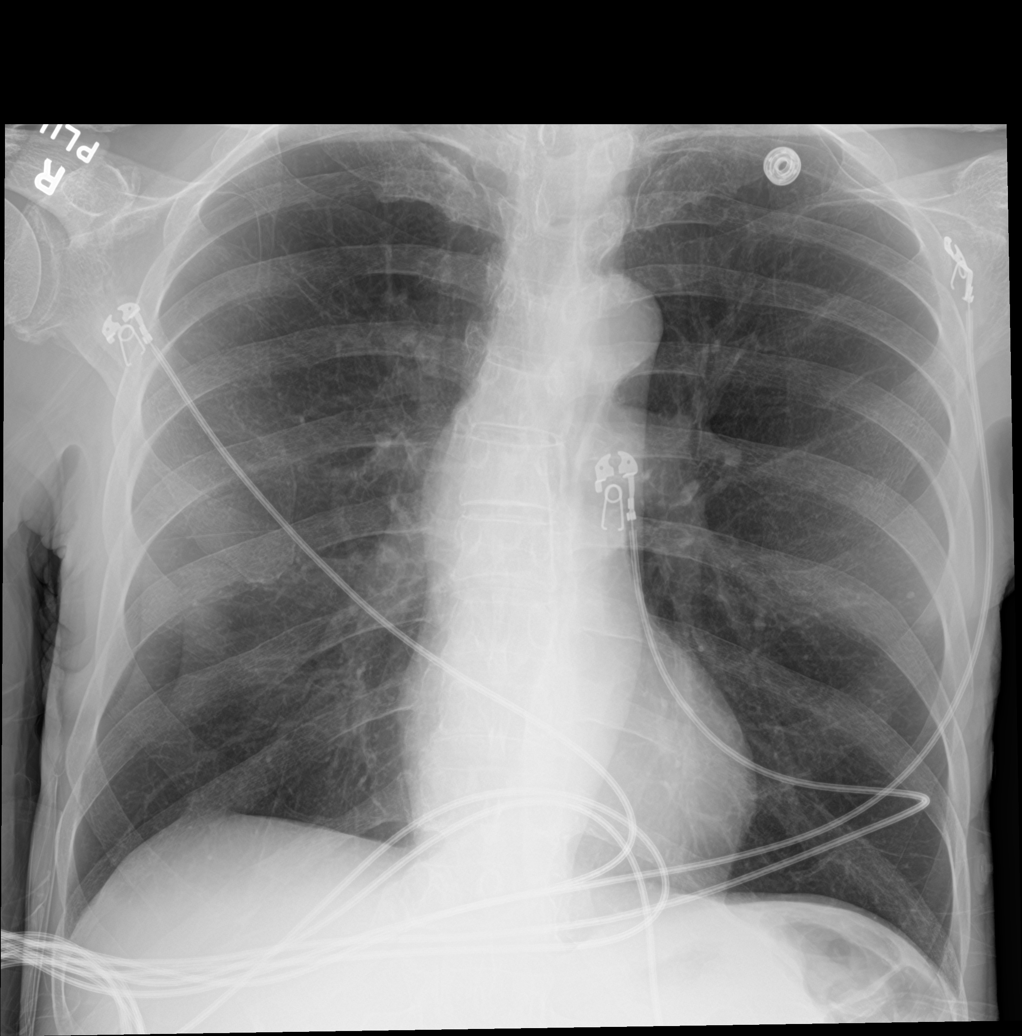

[abdomen erect]
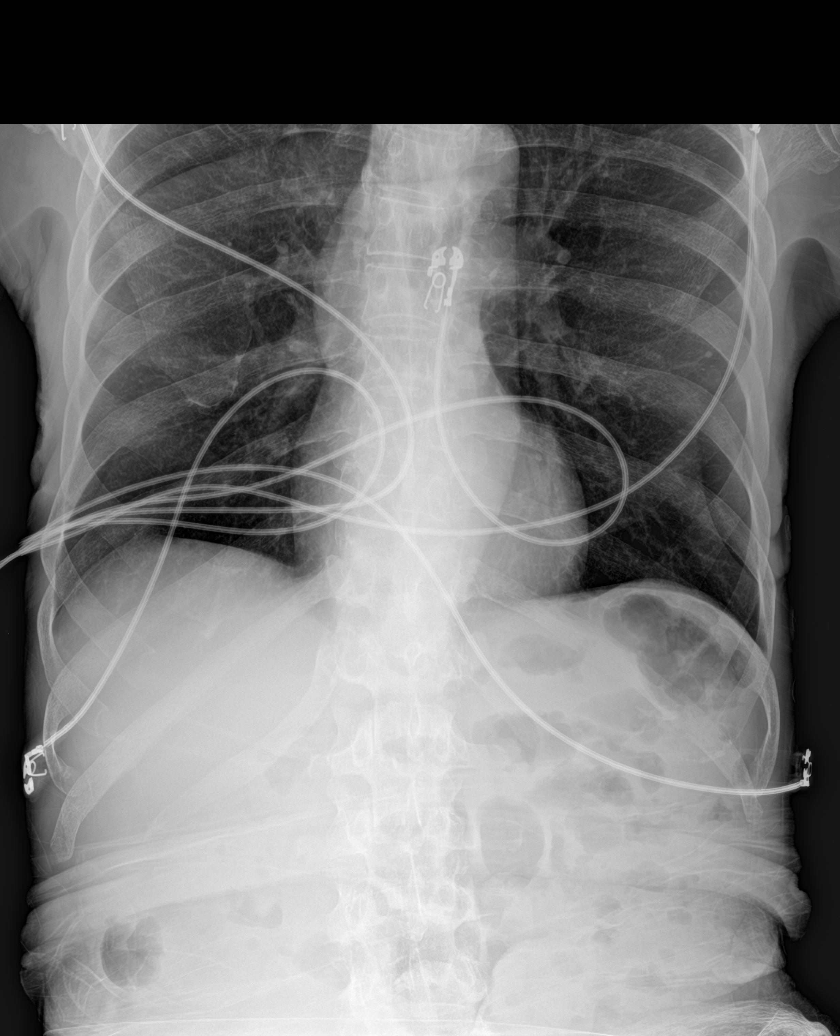

[abdomen supine (2 of 2)]
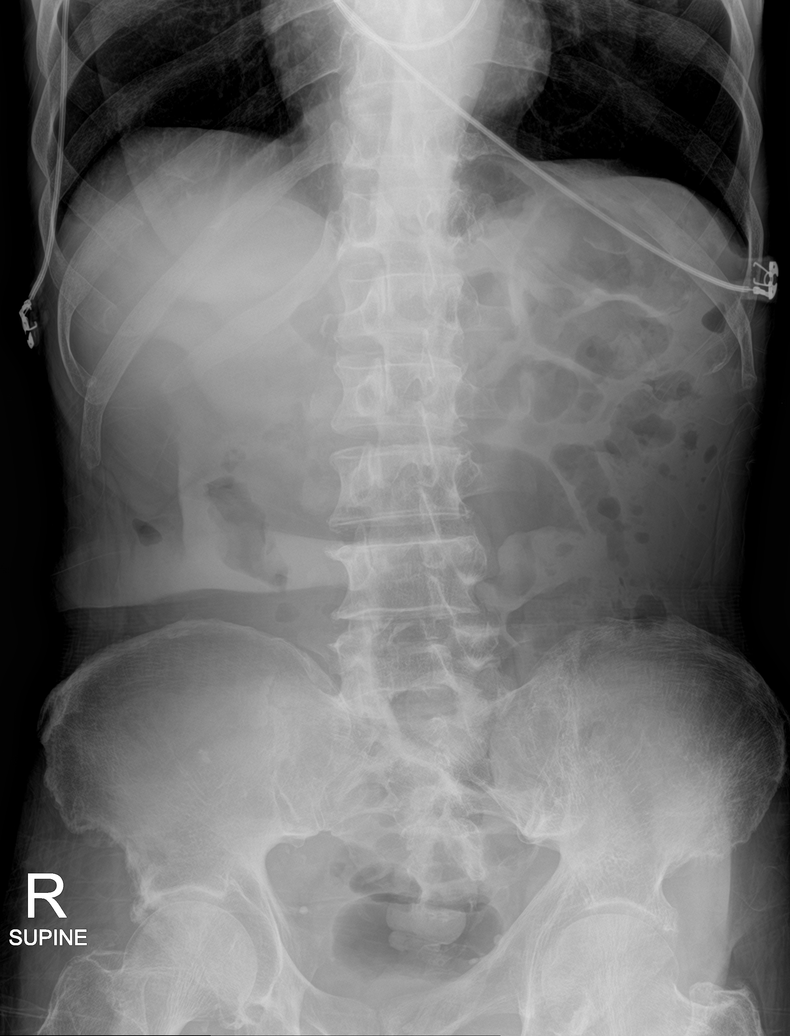

[4 of 4 positions shown; findings below may reference images not displayed]

FINDINGS: Normal heart size. Unchanged mediastinal contours. No acute airspace
disease. No pleural effusion or pneumothorax. No pulmonary edema.

No free intra-abdominal air. No bowel dilatation to suggest
obstruction. There is air scattered throughout nondilated small and
large bowel. No visualized radiopaque calculi. The bones are under
mineralized without acute osseous abnormality.
IMPRESSION: 1. No bowel obstruction or free air.
2. No acute intrathoracic process.

## 2021-06-06 IMAGING — CT CT ABD-PELV W/ CM
2 of 5 series · 15 of 46 positions shown, 17 images · IV contrast (omnipaque)
Comparison: CT abdomen pelvis dated [DATE].

CLINICAL DATA: Abdominal pain.

EXAM:
CT ABDOMEN AND PELVIS WITH CONTRAST
TECHNIQUE: Multidetector CT imaging of the abdomen and pelvis was performed
using the standard protocol following bolus administration of
intravenous contrast.
CONTRAST:  75mL OMNIPAQUE IOHEXOL 350 MG/ML SOLN

[Series 3: a/p w/ 5mm · axial · 0.77mm/px · z∈[+749,+1154]mm · 12 of 97 slices shown, 14 images]
[im 8/97  soft-tissue]
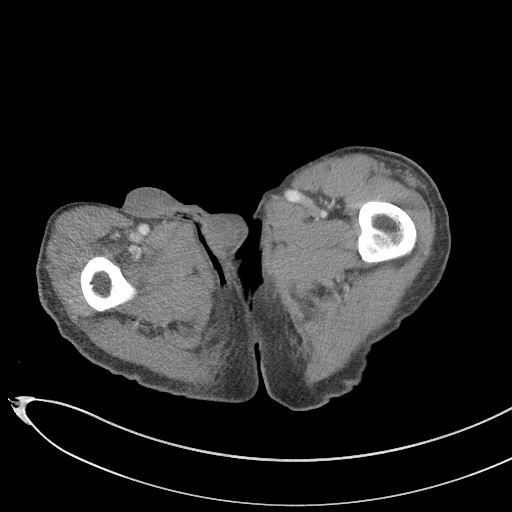
[im 8/97  bone]
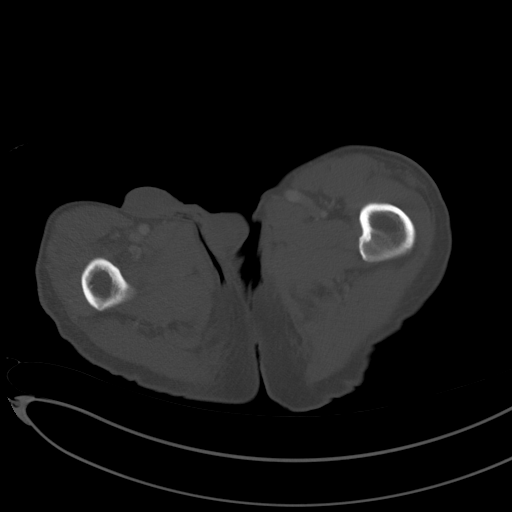
[im 15/97  soft-tissue]
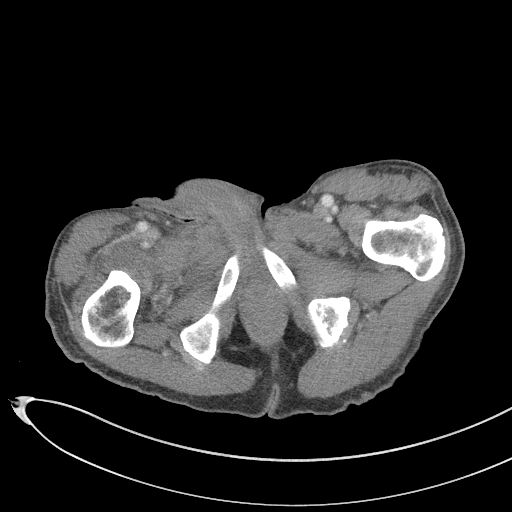
[im 23/97  soft-tissue]
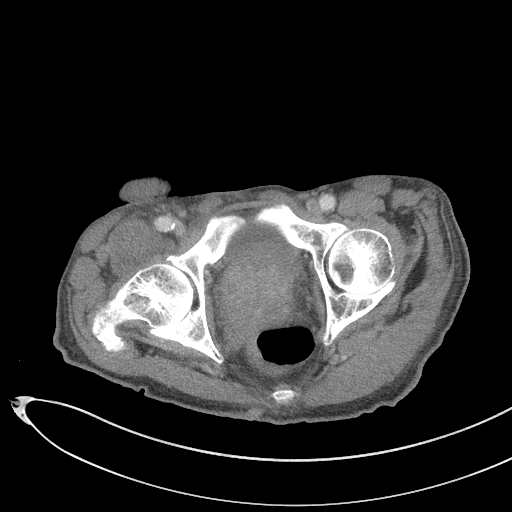
[im 30/97  soft-tissue]
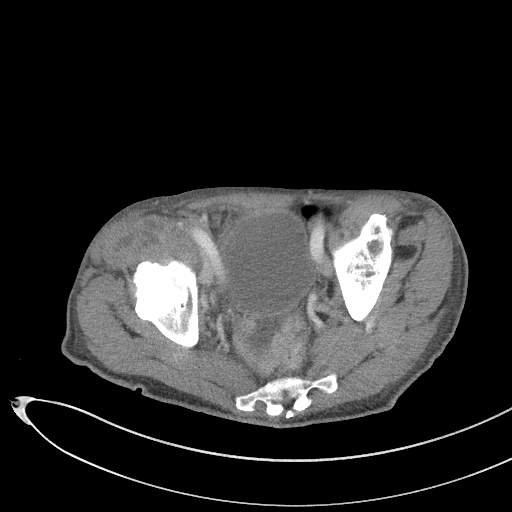
[im 37/97  soft-tissue]
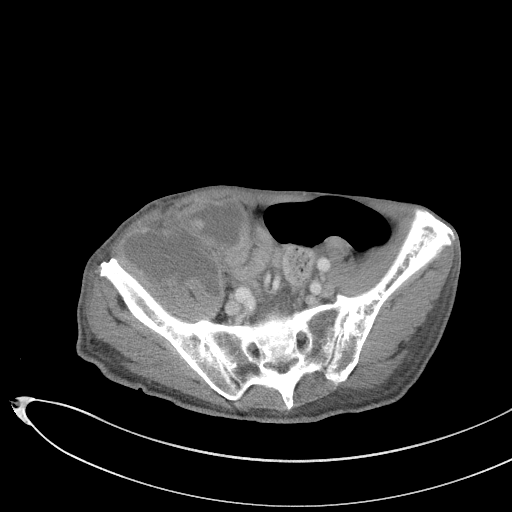
[im 45/97  soft-tissue]
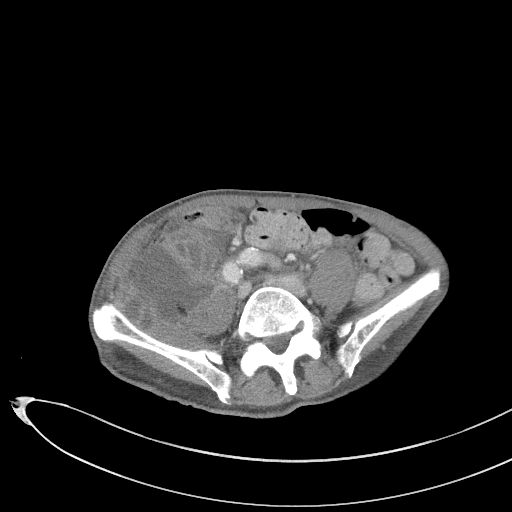
[im 52/97  soft-tissue]
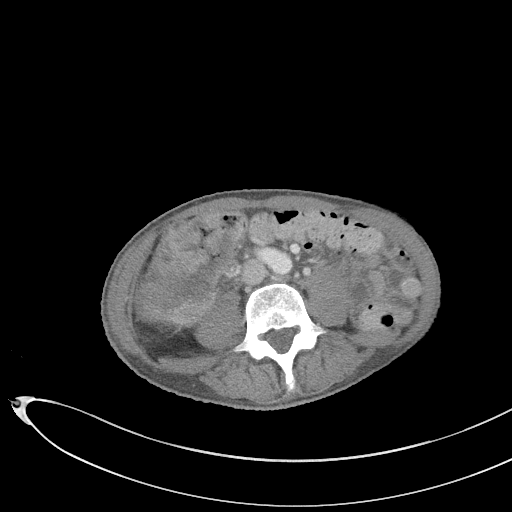
[im 60/97  soft-tissue]
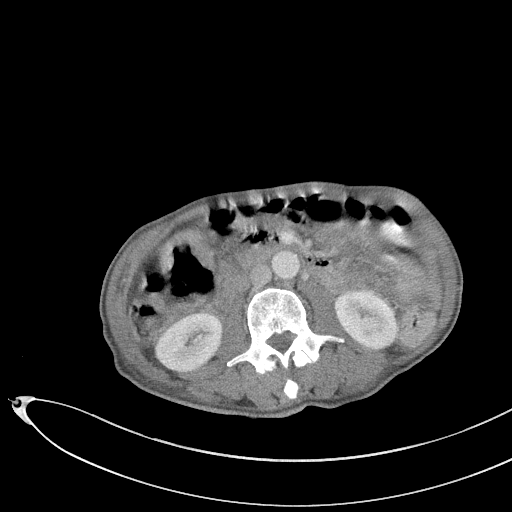
[im 67/97  soft-tissue]
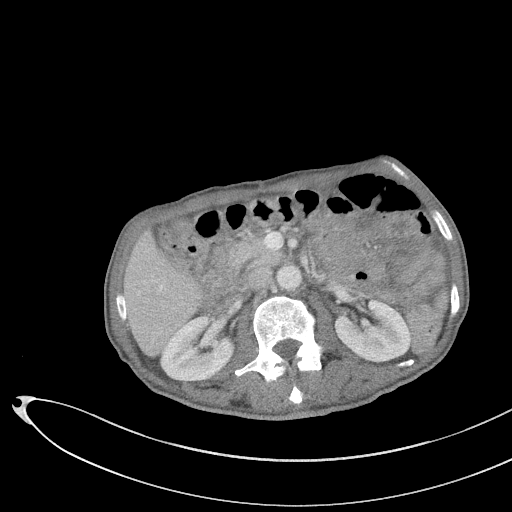
[im 67/97  bone]
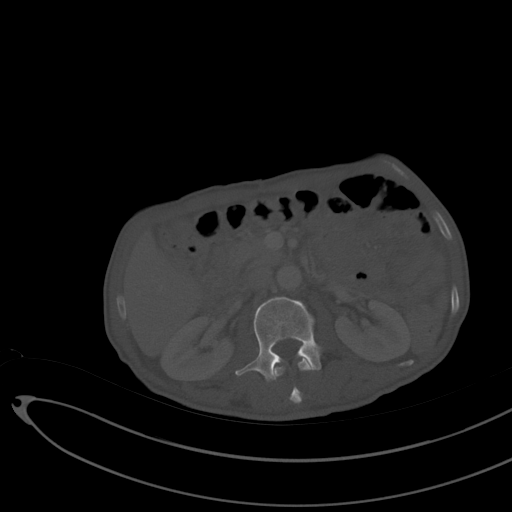
[im 74/97  soft-tissue]
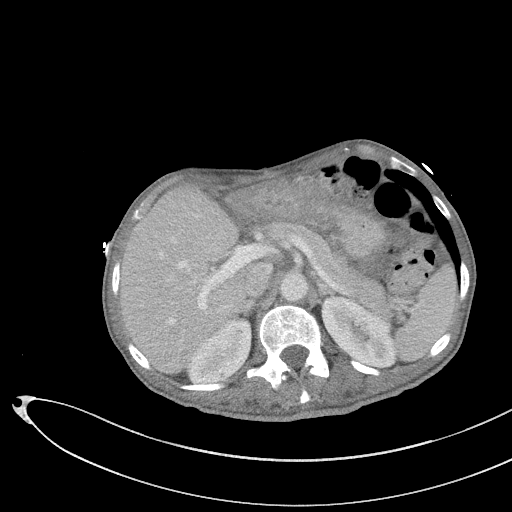
[im 82/97  soft-tissue]
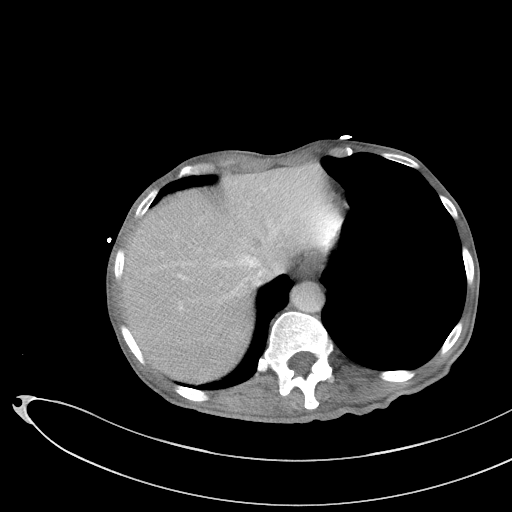
[im 89/97  soft-tissue]
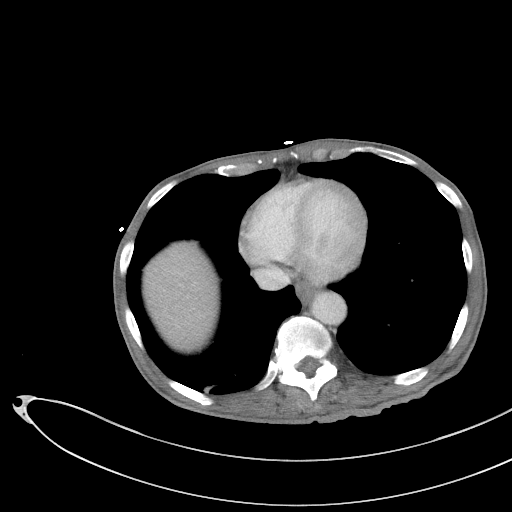

[Series 6: a/p w/ cor · coronal · 0.80mm/px · 3 of 130 slices shown]
[im 44/130  soft-tissue]
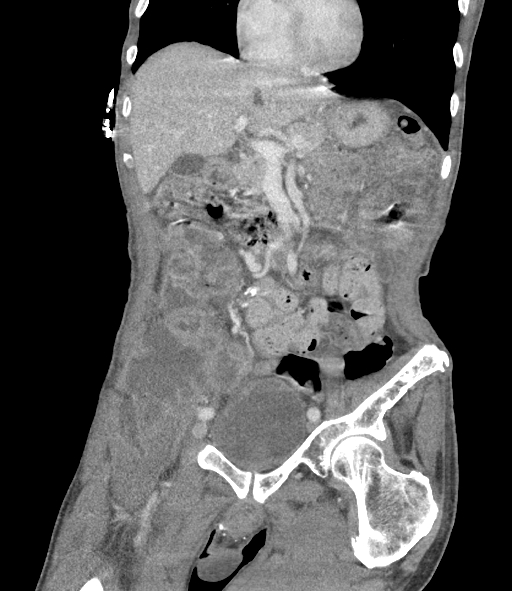
[im 58/130  soft-tissue]
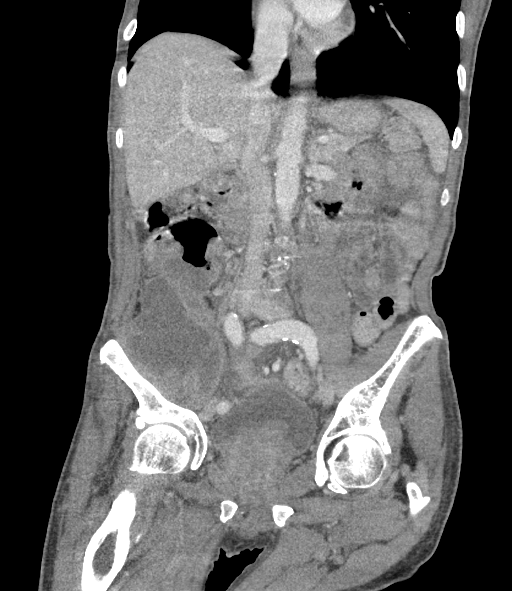
[im 72/130  soft-tissue]
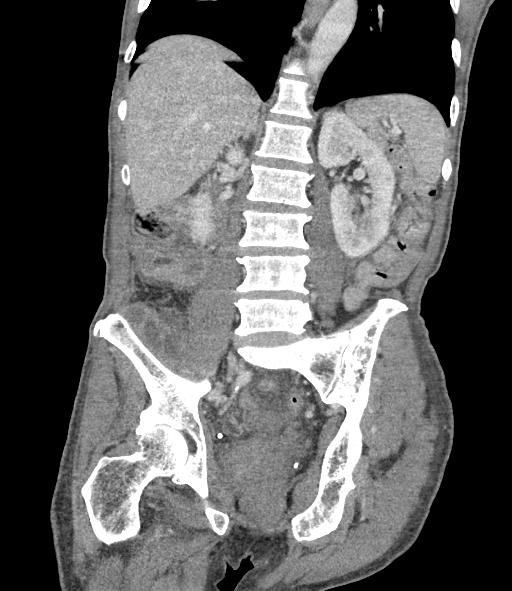

[15 of 46 positions shown; findings below may reference images not displayed]

FINDINGS: Lower chest: Right lung base linear atelectasis/scarring. The
visualized lung bases are otherwise clear. Coronary vascular
calcification.

No intra-abdominal free air.  No significant free fluid.

Hepatobiliary: A 1 cm left hepatic cyst. No intrahepatic biliary
dilatation. The gallbladder is unremarkable.

Pancreas: Unremarkable. No pancreatic ductal dilatation or
surrounding inflammatory changes.

Spleen: Normal in size without focal abnormality.

Adrenals/Urinary Tract: The adrenal glands are unremarkable. There
is no hydronephrosis on either side. There is symmetric enhancement
and excretion of contrast by both kidneys. The urinary bladder is
grossly unremarkable.

Stomach/Bowel: Evaluation of the bowel is limited due to paucity of
intra-abdominal fat. There is no bowel obstruction. There is
masslike thickening and irregularity of the cecum and proximal
ascending colon most concerning for malignancy. Further evaluation
with colonoscopy is recommended. The appendix is not identified.

Vascular/Lymphatic: Mild aortoiliac atherosclerotic disease. The IVC
is grossly unremarkable. No portal venous gas. There is no
adenopathy.

Reproductive: Enlarged prostate gland with median lobe hypertrophy
indenting the base of the bladder. The prostate gland measures
cm in transverse diameter.

Other: Multilobulated low attenuating mass or collection appears
somewhat contiguous with the cecum and proximal ascending colon and
extends into the right iliacus and distal psoas muscle measuring
approximately 9 x 9 cm in greatest axial dimensions and 13 cm in
craniocaudal length. This is concerning for a necrotic malignancy
although an infected collection or abscess is not excluded.

Musculoskeletal: There is osteopenia. No acute osseous pathology.
Severe cachexia.
IMPRESSION: 1. Masslike thickening and irregularity of the cecum and proximal
ascending colon most concerning for malignancy. Further evaluation
with colonoscopy is recommended.
2. Multilobulated low attenuating mass or collection appears
somewhat contiguous with the cecum and proximal ascending colon and
extends into the right iliacus and distal psoas muscle. This is
concerning for a necrotic malignancy although an infected collection
or abscess is not excluded.
3. Enlarged prostate gland with median lobe hypertrophy indenting
the base of the bladder.
4. Aortic Atherosclerosis ([NH]-[NH]).

## 2021-06-06 MED ORDER — ADULT MULTIVITAMIN W/MINERALS CH
1.0000 | ORAL_TABLET | Freq: Every day | ORAL | Status: DC
  Administered 2021-06-06 – 2021-06-15 (×10): 1 via ORAL
  Filled 2021-06-06 (×10): qty 1

## 2021-06-06 MED ORDER — PIPERACILLIN-TAZOBACTAM 3.375 G IVPB
3.3750 g | Freq: Three times a day (TID) | INTRAVENOUS | Status: DC
  Administered 2021-06-06 – 2021-06-09 (×10): 3.375 g via INTRAVENOUS
  Filled 2021-06-06 (×13): qty 50

## 2021-06-06 MED ORDER — DEXTROSE-NACL 5-0.9 % IV SOLN: INTRAVENOUS | Status: DC

## 2021-06-06 MED ORDER — ONDANSETRON HCL 4 MG/2ML IJ SOLN
4.0000 mg | Freq: Once | INTRAMUSCULAR | Status: AC
  Administered 2021-06-06: 4 mg via INTRAVENOUS
  Filled 2021-06-06: qty 2

## 2021-06-06 MED ORDER — LACTATED RINGERS IV BOLUS
1000.0000 mL | Freq: Once | INTRAVENOUS | Status: AC
  Administered 2021-06-06: 1000 mL via INTRAVENOUS

## 2021-06-06 MED ORDER — THIAMINE HCL 100 MG/ML IJ SOLN
100.0000 mg | INTRAMUSCULAR | Status: AC
  Administered 2021-06-07 – 2021-06-10 (×4): 100 mg via INTRAVENOUS
  Filled 2021-06-06 (×5): qty 2

## 2021-06-06 MED ORDER — ATORVASTATIN CALCIUM 80 MG PO TABS
80.0000 mg | ORAL_TABLET | Freq: Every day | ORAL | Status: DC
  Administered 2021-06-06 – 2021-06-09 (×4): 80 mg via ORAL
  Filled 2021-06-06: qty 2
  Filled 2021-06-06 (×3): qty 1

## 2021-06-06 MED ORDER — ASPIRIN 81 MG PO CHEW
81.0000 mg | CHEWABLE_TABLET | Freq: Every day | ORAL | Status: DC
  Administered 2021-06-06 – 2021-06-15 (×10): 81 mg via ORAL
  Filled 2021-06-06 (×10): qty 1

## 2021-06-06 MED ORDER — FERROUS SULFATE 325 (65 FE) MG PO TABS
325.0000 mg | ORAL_TABLET | Freq: Every day | ORAL | Status: DC
  Administered 2021-06-07 – 2021-06-08 (×2): 325 mg via ORAL
  Filled 2021-06-06 (×2): qty 1

## 2021-06-06 MED ORDER — ENSURE ENLIVE PO LIQD
237.0000 mL | Freq: Two times a day (BID) | ORAL | Status: DC
  Administered 2021-06-06: 237 mL via ORAL
  Filled 2021-06-06: qty 237

## 2021-06-06 MED ORDER — THIAMINE HCL 100 MG/ML IJ SOLN
250.0000 mg | Freq: Once | INTRAVENOUS | Status: AC
  Administered 2021-06-06: 250 mg via INTRAVENOUS
  Filled 2021-06-06: qty 2.5

## 2021-06-06 MED ORDER — IOHEXOL 350 MG/ML SOLN
100.0000 mL | Freq: Once | INTRAVENOUS | Status: AC | PRN
  Administered 2021-06-06: 75 mL via INTRAVENOUS

## 2021-06-06 MED ORDER — PIPERACILLIN-TAZOBACTAM 3.375 G IVPB 30 MIN
3.3750 g | Freq: Once | INTRAVENOUS | Status: AC
  Administered 2021-06-06: 3.375 g via INTRAVENOUS
  Filled 2021-06-06: qty 50

## 2021-06-06 NOTE — Consult Note (Signed)
Consult Note  Albert Love 17-Apr-1941  [id number].    Requesting MD: Dr. Christy Gentles Chief Complaint/Reason for Consult: Colonic mass, abscess?  HPI:  80 year old male with medical history significant for perforated gastric ulcer status post omental Graham patch 09/05/2019, central retinal artery occlusion right, H. pylori infection, hypertension, anemia, inguinal hernia on the right without obstruction or gangrene. He presented to the [facility] emergency department via EMS secondary to ongoing weakness, loss of appetite, and weight loss.  He states over the last 4 weeks his symptoms have been progressively worsening and he has had progressively less p.o. intake.  He has lost over 30 pounds since moving to [place] in 2021.  He has had some intermittent nausea over the last 4 weeks without emesis.  Bowel movements overall normal until the last 4 days -had an episode of uncontrolled diarrhea 4 days ago and no BM since.  Passing flatus.  He complains of pain "all over" but worst in his back and abdomen.  Work-up in the ED significant for CT scan showing masslike thickening and irregularity of the cecum and proximal ascending colon most concerning for malignancy, multilobulated low-attenuation mass/collection contiguous with cecum and proximal ascending colon extending into the right iliac [place] and distal psoas muscle with concern for necrosis or an infected collection or abscess.  WBC 30.5. General surgery was asked to see in regard to CT scan finding.  GI has also been consulted  As above he was admitted in 2021 for gastric ulcer with perforation and underwent ex lap with gastric biopsy, omental pedicle flap [name] patch) repair of perforated pyloric ulcer by Dr. Windle Guard on 09/05/2019.  He had H. pylori treatment during that time.  He was recommended to follow-up with GI for EGD and colonoscopy in regard to abnormal cecum on prior CT and follow-up of his gastric ulcer.  He did see Dr. Arelia Longest  in the clinic but did not follow-up for EGD/colonoscopy.  Substance use: Tobacco use, occasional alcohol use (daily) Allergies: NKDA Blood thinners: None  Lives alone in an apartment building on the seventh floor.  His next of kin and support is his nephew.  ROS: Review of Systems  Constitutional:  Positive for malaise/fatigue and weight loss. Negative for chills and fever.  Respiratory:  Negative for cough, shortness of breath and wheezing.   Cardiovascular:  Negative for chest pain and leg swelling.  Gastrointestinal:  Positive for abdominal pain and nausea. Negative for blood in stool, constipation, diarrhea, melena and vomiting.  Genitourinary: Negative.   Musculoskeletal:  Positive for back pain.  Neurological:  Positive for weakness.   Family History  Problem Relation Age of Onset   Colon cancer Neg Hx    Pancreatic cancer Neg Hx    Esophageal cancer Neg Hx     Past Medical History:  Diagnosis Date   Acute renal insufficiency 09/05/2019   Perforated gastric ulcer s/p omental Graham patch 09/05/2019 09/05/2019    Past Surgical History:  Procedure Laterality Date   BOWEL RESECTION N/A 09/05/2019   Procedure: SMALL BOWEL RESECTION;  Surgeon: Clovis Riley, MD;  Location: [facility] ORS;  Service: General;  Laterality: N/A;   IR SINUS/FIST TUBE CHK-NON GI  09/19/2019   IR SINUS/FIST TUBE CHK-NON GI  09/19/2019   LAPAROTOMY N/A 09/05/2019   Procedure: EXPLORATORY LAPAROTOMY WITH [facility] AND GASTRIC BIOPSY;  Surgeon: Clovis Riley, MD;  Location: [facility] ORS;  Service: General;  Laterality: N/A;    Social History:  reports that he has been smoking cigarettes. He has been smoking an average of .5 packs per day. He has never used smokeless tobacco. He reports current alcohol use. No history on file for drug use.  Allergies: No Known Allergies  (Not in a hospital admission)   Blood pressure (!) 145/74, pulse (!) 109, temperature 98.4 F (36.9 C), temperature source Oral, resp.  rate (!) 22, height 5\' 11"  (1.803 m), weight 61.7 kg, SpO2 96 %. Physical Exam:  General: pleasant, thin appearing male who is laying in bed in NAD HEENT: head is normocephalic, atraumatic.  Sclera are noninjected.  Pupils equal and round.  Ears and nose without any masses or lesions.  Mouth is pink and moist Heart: Tachycardic, regular rhythm.  Normal s1,s2. No obvious murmurs, gallops, or rubs noted.  Palpable radial and pedal pulses bilaterally Lungs: CTAB, no wheezes, rhonchi, or rales noted.  Respiratory effort nonlabored Abd: soft, NT, ND, +BS, no masses, hernias, or organomegaly.  Focal tenderness to palpation right lower quadrant without rebound or guarding MS: all 4 extremities are symmetrical with no cyanosis, clubbing, or edema. Skin: warm and dry with no masses, lesions, or rashes Neuro: Cranial nerves 2-12 grossly intact, sensation is normal throughout Psych: A&Ox3 with an appropriate affect.   Results for orders placed or performed during the hospital encounter of 06/05/21 (from the past 48 hour(s))  Resp Panel by RT-PCR (Flu A&B, Covid) Nasopharyngeal Swab     Status: None   Collection Time: 06/05/21 11:16 PM   Specimen: Nasopharyngeal Swab; Nasopharyngeal(NP) swabs in vial transport medium  Result Value Ref Range   SARS Coronavirus 2 by RT PCR NEGATIVE NEGATIVE    Comment: (NOTE) SARS-CoV-2 target nucleic acids are NOT DETECTED.  The SARS-CoV-2 RNA is generally detectable in upper respiratory specimens during the acute phase of infection. The lowest concentration of SARS-CoV-2 viral copies this assay can detect is 138 copies/mL. A negative result does not preclude SARS-Cov-2 infection and should not be used as the sole basis for treatment or other patient management decisions. A negative result may occur with  improper specimen collection/handling, submission of specimen other than nasopharyngeal swab, presence of viral mutation(s) within the areas targeted by this  assay, and inadequate number of viral copies(<138 copies/mL). A negative result must be combined with clinical observations, patient history, and epidemiological information. The expected result is Negative.  Fact Sheet for Patients:  EntrepreneurPulse.com.au  Fact Sheet for Healthcare Providers:  IncredibleEmployment.be  This test is no t yet approved or cleared by the [place] FDA and  has been authorized for detection and/or diagnosis of SARS-CoV-2 by FDA under an Emergency Use Authorization (EUA). This EUA will remain  in effect (meaning this test can be used) for the duration of the COVID-19 declaration under Section 564(b)(1) of the Act, 21 U.S.C.section 360bbb-3(b)(1), unless the authorization is terminated  or revoked sooner.       Influenza A by PCR NEGATIVE NEGATIVE   Influenza B by PCR NEGATIVE NEGATIVE    Comment: (NOTE) The Xpert Xpress SARS-CoV-2/FLU/RSV plus assay is intended as an aid in the diagnosis of influenza from Nasopharyngeal swab specimens and should not be used as a sole basis for treatment. Nasal washings and aspirates are unacceptable for Xpert Xpress SARS-CoV-2/FLU/RSV testing.  Fact Sheet for Patients: EntrepreneurPulse.com.au  Fact Sheet for Healthcare Providers: IncredibleEmployment.be  This test is not yet approved or cleared by the [place] FDA and has been authorized for detection and/or diagnosis of SARS-CoV-2 by FDA  under an Emergency Use Authorization (EUA). This EUA will remain in effect (meaning this test can be used) for the duration of the COVID-19 declaration under Section 564(b)(1) of the Act, 21 U.S.C. section 360bbb-3(b)(1), unless the authorization is terminated or revoked.  Performed at [facility], [place] [address] [place], [place] [address]   Comprehensive metabolic panel     Status: Abnormal   Collection Time: 06/05/21 11:28 PM   Result Value Ref Range   Sodium 133 (L) 135 - 145 mmol/L   Potassium 3.6 3.5 - 5.1 mmol/L   Chloride 96 (L) 98 - 111 mmol/L   CO2 25 22 - 32 mmol/L   Glucose, Bld 163 (H) 70 - 99 mg/dL    Comment: Glucose reference range applies only to samples taken after fasting for at least 8 hours.   BUN 14 8 - 23 mg/dL   Creatinine, Ser 0.97 0.61 - 1.24 mg/dL   Calcium 8.8 (L) 8.9 - 10.3 mg/dL   Total Protein 6.9 6.5 - 8.1 g/dL   Albumin 2.2 (L) 3.5 - 5.0 g/dL   AST 40 15 - 41 U/L   ALT 31 0 - 44 U/L   Alkaline Phosphatase 216 (H) 38 - 126 U/L   Total Bilirubin 0.8 0.3 - 1.2 mg/dL   GFR, Estimated >60 >60 mL/min    Comment: (NOTE) Calculated using the CKD-EPI Creatinine Equation (2021)    Anion gap 12 5 - 15    Comment: Performed at [facility], [place] [address] [place], [place] [address]  CBC with Differential     Status: Abnormal   Collection Time: 06/05/21 11:28 PM  Result Value Ref Range   WBC 30.5 (H) 4.0 - 10.5 K/uL   RBC 4.81 4.22 - 5.81 MIL/uL   Hemoglobin 10.1 (L) 13.0 - 17.0 g/dL   HCT 32.5 (L) 39.0 - 52.0 %   MCV 67.6 (L) 80.0 - 100.0 fL   MCH 21.0 (L) 26.0 - 34.0 pg   MCHC 31.1 30.0 - 36.0 g/dL   RDW 15.4 11.5 - 15.5 %   Platelets 577 (H) 150 - 400 K/uL    Comment: REPEATED TO VERIFY   nRBC 0.0 0.0 - 0.2 %   Neutrophils Relative % 89 %   Neutro Abs 27.1 (H) 1.7 - 7.7 K/uL   Lymphocytes Relative 4 %   Lymphs Abs 1.3 0.7 - 4.0 K/uL   Monocytes Relative 6 %   Monocytes Absolute 1.7 (H) 0.1 - 1.0 K/uL   Eosinophils Relative 0 %   Eosinophils Absolute 0.0 0.0 - 0.5 K/uL   Basophils Relative 0 %   Basophils Absolute 0.1 0.0 - 0.1 K/uL   RBC Morphology BURR CELLS     Comment: Acanthocytes present   Immature Granulocytes 1 %   Abs Immature Granulocytes 0.32 (H) 0.00 - 0.07 K/uL   Schistocytes NONE SEEN    Polychromasia PRESENT     Comment: Performed at [facility], [place] [address] [place], [place] [address]  Troponin I (High Sensitivity)     Status: None    Collection Time: 06/05/21 11:28 PM  Result Value Ref Range   Troponin I (High Sensitivity) 10 <18 ng/L    Comment: (NOTE) Elevated high sensitivity troponin I (hsTnI) values and significant  changes across serial measurements may suggest ACS but many other  chronic and acute conditions are known to elevate hsTnI results.  Refer to the "Links" section for chest pain algorithms and additional  guidance. Performed at [facility], [place] [address], [place], [place] [address]   Lipase, blood     Status: None   Collection Time: 06/05/21 11:28 PM  Result Value Ref Range   Lipase 23 11 - 51 U/L    Comment: Performed at [place] [address], [place], [place] [address]  CK     Status: None   Collection Time: 06/05/21 11:28 PM  Result Value Ref Range   Total CK 201 49 - 397 U/L    Comment: Performed at [facility], [place] [address] [place], [place] [address]  Troponin I (High Sensitivity)     Status: None   Collection Time: 06/06/21 12:53 AM  Result Value Ref Range   Troponin I (High Sensitivity) 15 <18 ng/L    Comment: (NOTE) Elevated high sensitivity troponin I (hsTnI) values and significant  changes across serial measurements may suggest ACS but many other  chronic and acute conditions are known to elevate hsTnI results.  Refer to the "Links" section for chest pain algorithms and additional  guidance. Performed at [facility], [place] [address] [place], [place] [address]   Urinalysis, Routine w reflex microscopic Urine, Clean Catch     Status: Abnormal   Collection Time: 06/06/21  3:40 AM  Result Value Ref Range   Color, Urine AMBER (A) YELLOW    Comment: BIOCHEMICALS MAY BE AFFECTED BY COLOR   APPearance HAZY (A) CLEAR   Specific Gravity, Urine 1.035 (H) 1.005 - 1.030   pH 5.0 5.0 - 8.0   Glucose, UA NEGATIVE NEGATIVE mg/dL   Hgb urine dipstick NEGATIVE NEGATIVE   Bilirubin Urine NEGATIVE NEGATIVE   Ketones, ur NEGATIVE NEGATIVE mg/dL   Protein,  ur 30 (A) NEGATIVE mg/dL   Nitrite NEGATIVE NEGATIVE   Leukocytes,Ua NEGATIVE NEGATIVE   RBC / HPF 0-5 0 - 5 RBC/hpf   WBC, UA 6-10 0 - 5 WBC/hpf   Bacteria, UA RARE (A) NONE SEEN   Squamous Epithelial / LPF 0-5 0 - 5   Mucus PRESENT    Hyaline Casts, UA PRESENT     Comment: Performed at [facility], [address] [place],  [address]   CT ABDOMEN PELVIS W CONTRAST  Result Date: 06/06/2021 CLINICAL DATA:  Abdominal pain. EXAM: CT ABDOMEN AND PELVIS WITH CONTRAST TECHNIQUE: Multidetector CT imaging of the abdomen and pelvis was performed using the standard protocol following bolus administration of intravenous contrast. CONTRAST:  43mL OMNIPAQUE IOHEXOL 350 MG/ML SOLN COMPARISON:  CT abdomen pelvis dated 09/17/2019. FINDINGS: Lower chest: Right lung base linear atelectasis/scarring. The visualized lung bases are otherwise clear. Coronary vascular calcification. No intra-abdominal free air.  No significant free fluid. Hepatobiliary: A 1 cm left hepatic cyst. No intrahepatic biliary dilatation. The gallbladder is unremarkable. Pancreas: Unremarkable. No pancreatic ductal dilatation or surrounding inflammatory changes. Spleen: Normal in size without focal abnormality. Adrenals/Urinary Tract: The adrenal glands are unremarkable. There is no hydronephrosis on either side. There is symmetric enhancement and excretion of contrast by both kidneys. The urinary bladder is grossly unremarkable. Stomach/Bowel: Evaluation of the bowel is limited due to paucity of intra-abdominal fat. There is no bowel obstruction. There is masslike thickening and irregularity of the cecum and proximal ascending colon most concerning for malignancy. Further evaluation with colonoscopy is recommended. The appendix is not identified. Vascular/Lymphatic: Mild aortoiliac atherosclerotic disease. The IVC is grossly unremarkable. No portal venous gas. There is no adenopathy. 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 The prostate gland measures 5.7 cm in transverse diameter. Other: Multilobulated low attenuating mass or collection appears somewhat contiguous with the cecum and proximal ascending colon and extends into the right iliacus and distal psoas muscle measuring approximately 9 x 9 cm in greatest axial dimensions and 13 cm in craniocaudal length. This is concerning for a necrotic malignancy although an infected collection or abscess is not excluded. Musculoskeletal: There is osteopenia. No acute osseous pathology. Severe cachexia. IMPRESSION: 1. Masslike thickening and irregularity of the cecum and proximal ascending colon most concerning for malignancy. Further evaluation with colonoscopy is recommended. 2. Multilobulated low attenuating mass or collection appears somewhat contiguous with the cecum and proximal ascending colon and extends into the right iliacus and distal psoas muscle. This is concerning for a necrotic malignancy although an infected collection or abscess is not excluded. 3. 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 4. Aortic Atherosclerosis (ICD10-I70.0). Electronically Signed   By: Anner Crete M.D.   On: 06/06/2021 03:33   DG Abdomen Acute W/Chest  Result Date: 06/06/2021 CLINICAL DATA:  Right lower quadrant abdominal pain. Fatigue. Weakness. History of perforated gastric ulcer. EXAM: DG ABDOMEN ACUTE WITH 1 VIEW CHEST COMPARISON:  Abdominopelvic CT and chest radiograph 09/17/2019 FINDINGS: Normal heart size. Unchanged mediastinal contours. No acute airspace disease. No pleural effusion or pneumothorax. No pulmonary edema. No free intra-abdominal air. No bowel dilatation to suggest obstruction. There is air scattered throughout nondilated small and large bowel. No visualized radiopaque calculi. The bones are under mineralized without acute osseous abnormality. IMPRESSION: 1. No bowel obstruction  or free air. 2. No acute intrathoracic process. Electronically Signed   By: Keith Rake M.D.   On: 06/06/2021 00:39      Assessment/Plan ?Colonic mass, abscess - CT 10/23 showing masslike thickening and irregularity of the cecum and proximal ascending colon most concerning for malignancy, multilobulated low-attenuation mass or collection contiguous with cecum and proximal ascending colon extending into the right iliac [place] and distal psoas muscle with concern for necrosis or an infected collection or abscess -History of perforated gastric ulcer status post omental Graham patch 121/21 by Dr. Kae Heller, episode current IR drain placement x2 -Spoke with GI and they are not planning colonoscopy at this time.  Agree with concern for abscess -No urgent/emergent surgical intervention recommended at this time.  Recommend bowel rest with just sips and chips for now while awaiting formal GI consult.  Recommend continued IV antibiotics.  He may benefit from repeat CT scan pending progress with IV antibiotics as well as possible IR drainage -Follow leukocytosis -Given his recent weight loss and my discussion with patient who greatly values quality of life will consult palliative for ongoing goals of care discussion in light of potential for future surgical intervention  We will continue to follow  FEN: NPO with sips and chips for now, IVF ID: Continue Zosyn VTE: Okay for chemical prophylaxis from our standpoint   Winferd Humphrey, [facility] Surgery 06/06/2021, 10:16 AM Please see Amion for pager number during day hours 7:00am-4:30pm

## 2021-06-06 NOTE — ED Notes (Signed)
ED TO INPATIENT HANDOFF REPORT  ED Nurse Name and Phone #: Mel Almond [phone]  S Name/Age/Gender Albert Love 80 y.o. male Room/Bed: H021C/H021C  Code Status   Code Status: Full Code  Home/SNF/Other Home Patient oriented to: self, place, time, and situation Is this baseline? 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 A&OX4.   BP - 130/82, HR-106, O2-98%, CBG 162   Allergies No Known Allergies  Level of Care/Admitting Diagnosis ED Disposition     ED Disposition  Admit   Condition  --   Comment  Hospital Area: [facility] [[id number]]  Level of Care: Med-Surg [16]  May place patient in observation at [facility] or [facility] if equivalent level of care is available:: No  Covid Evaluation: Asymptomatic Screening Protocol (No Symptoms)  Diagnosis: Colonic mass [341937]  Admitting Physician: Ezequiel Essex [[id number]]  Attending Physician: Martyn Malay [[id number]]          B Medical/Surgery History Past Medical History:  Diagnosis Date   Acute renal insufficiency 09/05/2019   CRAO (central retinal artery occlusion)    Perforated gastric ulcer s/p omental [name] patch 09/05/2019 09/05/2019   Past Surgical History:  Procedure Laterality Date   BOWEL RESECTION N/A 09/05/2019   Procedure: SMALL BOWEL RESECTION;  Surgeon: Clovis Riley, MD;  Location: [facility];  Service: General;  Laterality: N/A;   IR SINUS/FIST TUBE CHK-NON GI  09/19/2019   IR SINUS/FIST TUBE CHK-NON GI  09/19/2019   LAPAROTOMY N/A 09/05/2019   Procedure: EXPLORATORY LAPAROTOMY WITH GRAHAM PATCH AND GASTRIC BIOPSY;  Surgeon: Clovis Riley, MD;  Location: [facility];  Service: General;  Laterality: N/A;     A IV Location/Drains/Wounds Patient Lines/Drains/Airways Status     Active Line/Drains/Airways     Name  Placement date Placement time Site Days   Peripheral IV 06/06/21 20 G Anterior;Proximal;Right Forearm 06/06/21  0121  Forearm  less than 1   Incision (Closed) 09/05/19 Abdomen Other (Comment) 09/05/19  1435  -- 640            Intake/Output Last 24 hours  Intake/Output Summary (Last 24 hours) at 06/06/2021 2005 Last data filed at 06/06/2021 0300 Gross per 24 hour  Intake 1000 ml  Output --  Net 1000 ml    Labs/Imaging Results for orders placed or performed during the hospital encounter of 06/05/21 (from the past 48 hour(s))  Resp Panel by RT-PCR (Flu A&B, Covid) Nasopharyngeal Swab     Status: None   Collection Time: 06/05/21 11:16 PM   Specimen: Nasopharyngeal Swab; Nasopharyngeal(NP) swabs in vial transport medium  Result Value Ref Range   SARS Coronavirus 2 by RT PCR NEGATIVE NEGATIVE    Comment: (NOTE) SARS-CoV-2 target nucleic acids are NOT DETECTED.  The SARS-CoV-2 RNA is generally detectable in upper respiratory specimens during the acute phase of infection. The lowest concentration of SARS-CoV-2 viral copies this assay can detect is 138 copies/mL. A negative result does not preclude SARS-Cov-2 infection and should not be used as the sole basis for treatment or other patient management decisions. A negative result may occur with  improper specimen collection/handling, submission of specimen other than nasopharyngeal swab, presence of viral mutation(s) within the areas targeted by this assay, and inadequate number of viral copies(<138 copies/mL). A negative result must be combined with clinical observations, patient history, and epidemiological  information. The expected result is Negative.  Fact Sheet for Patients:  EntrepreneurPulse.com.au  Fact Sheet for Healthcare Providers:  IncredibleEmployment.be  This test is no t yet approved or cleared by the [place] FDA and  has been authorized for detection and/or diagnosis of  SARS-CoV-2 by FDA under an Emergency Use Authorization (EUA). This EUA will remain  in effect (meaning this test can be used) for the duration of the COVID-19 declaration under Section 564(b)(1) of the Act, 21 U.S.C.section 360bbb-3(b)(1), unless the authorization is terminated  or revoked sooner.       Influenza A by PCR NEGATIVE NEGATIVE   Influenza B by PCR NEGATIVE NEGATIVE    Comment: (NOTE) The Xpert Xpress SARS-CoV-2/FLU/RSV plus assay is intended as an aid in the diagnosis of influenza from Nasopharyngeal swab specimens and should not be used as a sole basis for treatment. Nasal washings and aspirates are unacceptable for Xpert Xpress SARS-CoV-2/FLU/RSV testing.  Fact Sheet for Patients: EntrepreneurPulse.com.au  Fact Sheet for Healthcare Providers: IncredibleEmployment.be  This test is not yet approved or cleared by the [place] FDA and has been authorized for detection and/or diagnosis of SARS-CoV-2 by FDA under an Emergency Use Authorization (EUA). This EUA will remain in effect (meaning this test can be used) for the duration of the COVID-19 declaration under Section 564(b)(1) of the Act, 21 U.S.C. section 360bbb-3(b)(1), unless the authorization is terminated or revoked.  Performed at [facility], [place] [address], [place], [place] [address]   Comprehensive metabolic panel     Status: Abnormal   Collection Time: 06/05/21 11:28 PM  Result Value Ref Range   Sodium 133 (L) 135 - 145 mmol/L   Potassium 3.6 3.5 - 5.1 mmol/L   Chloride 96 (L) 98 - 111 mmol/L   CO2 25 22 - 32 mmol/L   Glucose, Bld 163 (H) 70 - 99 mg/dL    Comment: Glucose reference range applies only to samples taken after fasting for at least 8 hours.   BUN 14 8 - 23 mg/dL   Creatinine, Ser 0.97 0.61 - 1.24 mg/dL   Calcium 8.8 (L) 8.9 - 10.3 mg/dL   Total Protein 6.9 6.5 - 8.1 g/dL   Albumin 2.2 (L) 3.5 - 5.0 g/dL   AST 40 15 - 41 U/L   ALT 31 0 -  44 U/L   Alkaline Phosphatase 216 (H) 38 - 126 U/L   Total Bilirubin 0.8 0.3 - 1.2 mg/dL   GFR, Estimated >60 >60 mL/min    Comment: (NOTE) Calculated using the CKD-EPI Creatinine Equation (2021)    Anion gap 12 5 - 15    Comment: Performed at [facility], [place] [address] [place], [place] [address]  CBC with Differential     Status: Abnormal   Collection Time: 06/05/21 11:28 PM  Result Value Ref Range   WBC 30.5 (H) 4.0 - 10.5 K/uL   RBC 4.81 4.22 - 5.81 MIL/uL   Hemoglobin 10.1 (L) 13.0 - 17.0 g/dL   HCT 32.5 (L) 39.0 - 52.0 %   MCV 67.6 (L) 80.0 - 100.0 fL   MCH 21.0 (L) 26.0 - 34.0 pg   MCHC 31.1 30.0 - 36.0 g/dL   RDW 15.4 11.5 - 15.5 %   Platelets 577 (H) 150 - 400 K/uL    Comment: REPEATED TO VERIFY   nRBC 0.0 0.0 - 0.2 %   Neutrophils Relative % 89 %   Neutro Abs 27.1 (H) 1.7 - 7.7 K/uL  Lymphocytes Relative 4 %   Lymphs Abs 1.3 0.7 - 4.0 K/uL   Monocytes Relative 6 %   Monocytes Absolute 1.7 (H) 0.1 - 1.0 K/uL   Eosinophils Relative 0 %   Eosinophils Absolute 0.0 0.0 - 0.5 K/uL   Basophils Relative 0 %   Basophils Absolute 0.1 0.0 - 0.1 K/uL   RBC Morphology BURR CELLS     Comment: Acanthocytes present   Immature Granulocytes 1 %   Abs Immature Granulocytes 0.32 (H) 0.00 - 0.07 K/uL   Schistocytes NONE SEEN    Polychromasia PRESENT     Comment: Performed at [facility] [address] [place], [place] [address]  Troponin I (High Sensitivity)     Status: None   Collection Time: 06/05/21 11:28 PM  Result Value Ref Range   Troponin I (High Sensitivity) 10 <18 ng/L    Comment: (NOTE) Elevated high sensitivity troponin I (hsTnI) values and significant  changes across serial measurements may suggest ACS but many other  chronic and acute conditions are known to elevate hsTnI results.  Refer to the "Links" section for chest pain algorithms and additional  guidance. Performed at [facility], [place] [address] [place], [place]  [address]    Lipase, blood     Status: None   Collection Time: 06/05/21 11:28 PM  Result Value Ref Range   Lipase 23 11 - 51 U/L    Comment: Performed at [facility] [address] [place], [place] [address]  CK     Status: None   Collection Time: 06/05/21 11:28 PM  Result Value Ref Range   Total CK 201 49 - 397 U/L    Comment: Performed at [facility], [place] [address] [place], [place] [address]  Troponin I (High Sensitivity)     Status: None   Collection Time: 06/06/21 12:53 AM  Result Value Ref Range   Troponin I (High Sensitivity) 15 <18 ng/L    Comment: (NOTE) Elevated high sensitivity troponin I (hsTnI) values and significant  changes across serial measurements may suggest ACS but many other  chronic and acute conditions are known to elevate hsTnI results.  Refer to the "Links" section for chest pain algorithms and additional  guidance. Performed at [facility], [place] [address], [place], [place] [address]   Urinalysis, Routine w reflex microscopic Urine, Clean Catch     Status: Abnormal   Collection Time: 06/06/21  3:40 AM  Result Value Ref Range   Color, Urine AMBER (A) YELLOW    Comment: BIOCHEMICALS MAY BE AFFECTED BY COLOR   APPearance HAZY (A) CLEAR   Specific Gravity, Urine 1.035 (H) 1.005 - 1.030   pH 5.0 5.0 - 8.0   Glucose, UA NEGATIVE NEGATIVE mg/dL   Hgb urine dipstick NEGATIVE NEGATIVE   Bilirubin Urine NEGATIVE NEGATIVE   Ketones, ur NEGATIVE NEGATIVE mg/dL   Protein, ur 30 (A) NEGATIVE mg/dL   Nitrite NEGATIVE NEGATIVE   Leukocytes,Ua NEGATIVE NEGATIVE   RBC / HPF 0-5 0 - 5 RBC/hpf   WBC, UA 6-10 0 - 5 WBC/hpf   Bacteria, UA RARE (A) NONE SEEN   Squamous Epithelial / LPF 0-5 0 - 5   Mucus PRESENT    Hyaline Casts, UA PRESENT     Comment: Performed at [facility], [address] [place], [place] [address]  CBG monitoring, ED     Status: Abnormal   Collection Time: 06/06/21 10:50 AM  Result  Value Ref Range   Glucose-Capillary 181 (H)  70 - 99 mg/dL    Comment: Glucose reference range applies only to samples taken after fasting for at least 8 hours.  Gamma GT     Status: None   Collection Time: 06/06/21 12:00 PM  Result Value Ref Range   GGT 21 7 - 50 U/L    Comment: Performed at [facility], [place] [address], [place], [place] [address]  Magnesium     Status: None   Collection Time: 06/06/21 12:00 PM  Result Value Ref Range   Magnesium 1.8 1.7 - 2.4 mg/dL    Comment: Performed at [facility], [place] [address], [place], [place] [address]  Comprehensive metabolic panel     Status: Abnormal   Collection Time: 06/06/21 12:00 PM  Result Value Ref Range   Sodium 136 135 - 145 mmol/L   Potassium 3.5 3.5 - 5.1 mmol/L   Chloride 99 98 - 111 mmol/L   CO2 26 22 - 32 mmol/L   Glucose, Bld 175 (H) 70 - 99 mg/dL    Comment: Glucose reference range applies only to samples taken after fasting for at least 8 hours.   BUN 12 8 - 23 mg/dL   Creatinine, Ser 0.86 0.61 - 1.24 mg/dL   Calcium 8.4 (L) 8.9 - 10.3 mg/dL   Total Protein 6.0 (L) 6.5 - 8.1 g/dL   Albumin 1.9 (L) 3.5 - 5.0 g/dL   AST 44 (H) 15 - 41 U/L   ALT 28 0 - 44 U/L   Alkaline Phosphatase 95 38 - 126 U/L   Total Bilirubin 0.8 0.3 - 1.2 mg/dL   GFR, Estimated >60 >60 mL/min    Comment: (NOTE) Calculated using the CKD-EPI Creatinine Equation (2021)    Anion gap 11 5 - 15    Comment: Performed at [facility], [place] [address], [place], [place] [address]  CBC     Status: Abnormal   Collection Time: 06/06/21 12:00 PM  Result Value Ref Range   WBC 27.2 (H) 4.0 - 10.5 K/uL   RBC 4.48 4.22 - 5.81 MIL/uL   Hemoglobin 9.4 (L) 13.0 - 17.0 g/dL   HCT 29.8 (L) 39.0 - 52.0 %   MCV 66.5 (L) 80.0 - 100.0 fL   MCH 21.0 (L) 26.0 - 34.0 pg   MCHC 31.5 30.0 - 36.0 g/dL   RDW 15.4 11.5 - 15.5 %   Platelets 456 (H) 150 - 400 K/uL    Comment: REPEATED TO VERIFY   nRBC 0.0 0.0 - 0.2 %    Comment: Performed at [facility], [place] [address], [place],  [place] [address]  CBG monitoring, ED     Status: Abnormal   Collection Time: 06/06/21  4:12 PM  Result Value Ref Range   Glucose-Capillary 127 (H) 70 - 99 mg/dL    Comment: Glucose reference range applies only to samples taken after fasting for at least 8 hours.   CT ABDOMEN PELVIS W CONTRAST  Result Date: 06/06/2021 CLINICAL DATA:  Abdominal pain. EXAM: CT ABDOMEN AND PELVIS WITH CONTRAST TECHNIQUE: Multidetector CT imaging of the abdomen and pelvis was performed using the standard protocol following bolus administration of intravenous contrast. CONTRAST:  83mL OMNIPAQUE IOHEXOL 350 MG/ML SOLN COMPARISON:  CT abdomen pelvis dated 09/17/2019. FINDINGS: Lower chest: Right lung base linear atelectasis/scarring. The visualized lung bases are otherwise clear. Coronary vascular calcification. No intra-abdominal free air.  No significant free fluid. Hepatobiliary:  A 1 cm left hepatic cyst. No intrahepatic biliary dilatation. The gallbladder is unremarkable. Pancreas: Unremarkable. No pancreatic ductal dilatation or surrounding inflammatory changes. Spleen: Normal in size without focal abnormality. Adrenals/Urinary Tract: The adrenal glands are unremarkable. There is no hydronephrosis on either side. There is symmetric enhancement and excretion of contrast by both kidneys. The urinary bladder is grossly unremarkable. Stomach/Bowel: Evaluation of the bowel is limited due to paucity of intra-abdominal fat. There is no bowel obstruction. There is masslike thickening and irregularity of the cecum and proximal ascending colon most concerning for malignancy. Further evaluation with colonoscopy is recommended. The appendix is not identified. Vascular/Lymphatic: Mild aortoiliac atherosclerotic disease. The IVC is grossly unremarkable. No portal venous gas. There is no adenopathy. 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 The prostate gland measures 5.7 cm in transverse  diameter. Other: Multilobulated low attenuating mass or collection appears somewhat contiguous with the cecum and proximal ascending colon and extends into the right iliacus and distal psoas muscle measuring approximately 9 x 9 cm in greatest axial dimensions and 13 cm in craniocaudal length. This is concerning for a necrotic malignancy although an infected collection or abscess is not excluded. Musculoskeletal: There is osteopenia. No acute osseous pathology. Severe cachexia. IMPRESSION: 1. Masslike thickening and irregularity of the cecum and proximal ascending colon most concerning for malignancy. Further evaluation with colonoscopy is recommended. 2. Multilobulated low attenuating mass or collection appears somewhat contiguous with the cecum and proximal ascending colon and extends into the right iliacus and distal psoas muscle. This is concerning for a necrotic malignancy although an infected collection or abscess is not excluded. 3. 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 4. Aortic Atherosclerosis (ICD10-I70.0). Electronically Signed   By: Anner Crete M.D.   On: 06/06/2021 03:33   DG Abdomen Acute W/Chest  Result Date: 06/06/2021 CLINICAL DATA:  Right lower quadrant abdominal pain. Fatigue. Weakness. History of perforated gastric ulcer. EXAM: DG ABDOMEN ACUTE WITH 1 VIEW CHEST COMPARISON:  Abdominopelvic CT and chest radiograph 09/17/2019 FINDINGS: Normal heart size. Unchanged mediastinal contours. No acute airspace disease. No pleural effusion or pneumothorax. No pulmonary edema. No free intra-abdominal air. No bowel dilatation to suggest obstruction. There is air scattered throughout nondilated small and large bowel. No visualized radiopaque calculi. The bones are under mineralized without acute osseous abnormality. IMPRESSION: 1. No bowel obstruction or free air. 2. No acute intrathoracic process. Electronically Signed   By: Keith Rake M.D.    On: 06/06/2021 00:39    Pending Labs Unresulted Labs (From admission, onward)     Start     Ordered   06/07/21 0500  Comprehensive metabolic panel  Daily,   R      06/06/21 0856   06/07/21 0500  Magnesium  Daily,   R      06/06/21 0856   06/07/21 0500  CBC with Differential/Platelet  Daily,   R      06/06/21 0906   06/07/21 0500  CEA  Tomorrow morning,   R        06/06/21 1839   06/06/21 1332  Culture, blood (routine x 2)  BLOOD CULTURE X 2,   R (with TIMED occurrences)      06/06/21 1331            Vitals/Pain Today's Vitals   06/06/21 1700 06/06/21 1800 06/06/21 1927 06/06/21 1927  BP: 135/75 (!) 152/85 (!) 170/86   Pulse: Marland Kitchen)  109 (!) 108 (!) 110   Resp: (!) 24 (!) 26 (!) 27   Temp:      TempSrc:      SpO2: 96% 99% 98%   Weight:      Height:      PainSc:    0-No pain    Isolation Precautions No active isolations  Medications Medications  multivitamin with minerals tablet 1 tablet (1 tablet Oral Given 06/06/21 0958)  thiamine (B-1) 250 mg in sodium chloride 0.9 % 50 mL IVPB (0 mg Intravenous Stopped 06/06/21 1034)    Followed by  thiamine (B-1) injection 100 mg (has no administration in time range)  dextrose 5 %-0.9 % sodium chloride infusion ( Intravenous Rate/Dose Verify 06/06/21 1926)  ferrous sulfate tablet 325 mg (has no administration in time range)  atorvastatin (LIPITOR) tablet 80 mg (80 mg Oral Given 06/06/21 1550)  aspirin chewable tablet 81 mg (81 mg Oral Given 06/06/21 1550)  piperacillin-tazobactam (ZOSYN) IVPB 3.375 g (0 g Intravenous Stopped 06/06/21 1839)  lactated ringers bolus 1,000 mL (0 mLs Intravenous Stopped 06/06/21 0300)  ondansetron (ZOFRAN) injection 4 mg (4 mg Intravenous Given 06/06/21 0129)  lactated ringers bolus 1,000 mL (0 mLs Intravenous Stopped 06/06/21 0500)  iohexol (OMNIPAQUE) 350 MG/ML injection 100 mL (75 mLs Intravenous Contrast Given 06/06/21 0321)  piperacillin-tazobactam (ZOSYN) IVPB 3.375 g (0 g Intravenous Stopped  06/06/21 0438)    Mobility Moderate fall risk   Focused Assessments    R Recommendations: See Admitting Provider Note  Report given to:   Additional Notes:

## 2021-06-06 NOTE — Consult Note (Addendum)
Referring Provider: Teaching Service.  PCP: Gladys Damme, MD  Gastroenterologist: Silvano Rusk, MD Reason for consultation:    colon mass               ASSESSMENT / PLAN   #   80 yo male with severe weight loss, malnutrition, and iron deficiency anemia,  in setting of cecal mass with ? Necrosis / abscess and marked leukocytosis.       --Abnormal cecum first seen on CT scan late Jan 2021. Unfortunately patient cancelled the colonoscopy that we scheduled to evaluate the lesion. Rescheduled for procedure after being seen again in the office Sept 2022 . Unable to wait until outpatient colonoscopy due to abdominal pain and weakness --He will most likely need colonoscopy but holding off for now given marked leukocytosis / ? abscess  --Continue Zosyn.  --May need TNA for malnutrition --We have asked General Surgery to evaluate.   # Chronic iron deficiency anemia. Hgb 10.1 ( stable from ~ 1 month ago). Ferritin 14. IDA possibly related to undiagnosed colon neoplasm.   # Enlarged prostate  # Additional medical history listed below.   HISTORY OF PRESENT ILLNESS                                                                                                                         Chief Complaint: abdominal pain  Albert Love is a 80 y.o. male with a past medical history significant for PUD with perforation ( Jan 2021), H.Pylori, iron deficiency anemia, right inguinal hernia See PMH for any additional medical history.   Patient seen in our office 9/28 for anemia and abnormal cecum on CT scan.  EGD and colonoscopy recommended. Of note, he had been seen in office last May for evaluation of previous gastric ulcer and abnormal cecum on CT scan and was scheduled for EGD / colonoscopy but cancelled procedures.   ED course:  Patient presented to ED last night with fatigue, poor appetite, trouble walking.   WBC 30K, hgb 10.1, MCV 67, platelets 577., alk phos 216, Tbili 0.8, AST 40 / ALT 31,  lipase 23. Tr 10.   CTAP as below. Briefly there is a masslike thickening and irregularity of the cecum and proximal ascending colon. Multilobulated low attenuating mass or collection appears somewhat contiguous with the cecum and proximal ascending colon and extends into the right iliacus and distal psoas muscle. This is concerning for a necrotic malignancy although an infected collection or abscess is not excluded.   [name] has been having RLQ pain, nausea and decreased appetite. Is complains of back pain and weakness. He said he could not wait until his outpatient colonoscopy, wasn't strong enough. Reports a 50 pound weight loss since Jan 2021. He has been eating very little due to pain and nausea. He is having BMs but of decreased frequency due to limited PO intake. No blood  in stools. No fevers.    Imaging:  CT ABDOMEN PELVIS W CONTRAST  Result Date: 06/06/2021 CLINICAL DATA:  Abdominal pain. EXAM: CT ABDOMEN AND PELVIS WITH CONTRAST TECHNIQUE: Multidetector CT imaging of the abdomen and pelvis was performed using the standard protocol following bolus administration of intravenous contrast. CONTRAST:  91m OMNIPAQUE IOHEXOL 350 MG/ML SOLN COMPARISON:  CT abdomen pelvis dated 09/17/2019. FINDINGS: Lower chest: Right lung base linear atelectasis/scarring. The visualized lung bases are otherwise clear. Coronary vascular calcification. No intra-abdominal free air.  No significant free fluid. Hepatobiliary: A 1 cm left hepatic cyst. No intrahepatic biliary dilatation. The gallbladder is unremarkable. Pancreas: Unremarkable. No pancreatic ductal dilatation or surrounding inflammatory changes. Spleen: Normal in size without focal abnormality. Adrenals/Urinary Tract: The adrenal glands are unremarkable. There is no hydronephrosis on either side. There is symmetric enhancement and excretion of contrast by both kidneys. The urinary bladder is grossly unremarkable. Stomach/Bowel: Evaluation of the bowel is  limited due to paucity of intra-abdominal fat. There is no bowel obstruction. There is masslike thickening and irregularity of the cecum and proximal ascending colon most concerning for malignancy. Further evaluation with colonoscopy is recommended. The appendix is not identified. Vascular/Lymphatic: Mild aortoiliac atherosclerotic disease. The IVC is grossly unremarkable. No portal venous gas. There is no adenopathy. 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 The prostate gland measures 5.7 cm in transverse diameter. Other: Multilobulated low attenuating mass or collection appears somewhat contiguous with the cecum and proximal ascending colon and extends into the right iliacus and distal psoas muscle measuring approximately 9 x 9 cm in greatest axial dimensions and 13 cm in craniocaudal length. This is concerning for a necrotic malignancy although an infected collection or abscess is not excluded. Musculoskeletal: There is osteopenia. No acute osseous pathology. Severe cachexia. IMPRESSION: 1. Masslike thickening and irregularity of the cecum and proximal ascending colon most concerning for malignancy. Further evaluation with colonoscopy is recommended. 2. Multilobulated low attenuating mass or collection appears somewhat contiguous with the cecum and proximal ascending colon and extends into the right iliacus and distal psoas muscle. This is concerning for a necrotic malignancy although an infected collection or abscess is not excluded. 3. 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 4. Aortic Atherosclerosis (ICD10-I70.0). Electronically Signed   By: AAnner CreteM.D.   On: 06/06/2021 03:33   DG Abdomen Acute W/Chest  Result Date: 06/06/2021 CLINICAL DATA:  Right lower quadrant abdominal pain. Fatigue. Weakness. History of perforated gastric ulcer. EXAM: DG ABDOMEN ACUTE WITH 1 VIEW CHEST COMPARISON:   Abdominopelvic CT and chest radiograph 09/17/2019 FINDINGS: Normal heart size. Unchanged mediastinal contours. No acute airspace disease. No pleural effusion or pneumothorax. No pulmonary edema. No free intra-abdominal air. No bowel dilatation to suggest obstruction. There is air scattered throughout nondilated small and large bowel. No visualized radiopaque calculi. The bones are under mineralized without acute osseous abnormality. IMPRESSION: 1. No bowel obstruction or free air. 2. No acute intrathoracic process. Electronically Signed   By: MKeith RakeM.D.   On: 06/06/2021 00:39     Past Medical History:  Diagnosis Date   Acute renal insufficiency 09/05/2019   Perforated gastric ulcer s/p omental GPhillip Healpatch 09/05/2019 09/05/2019    Past Surgical History:  Procedure Laterality Date   BOWEL RESECTION N/A 09/05/2019   Procedure: SMALL BOWEL RESECTION;  Surgeon: CClovis Riley MD;  Location: [facility];  Service: General;  Laterality: N/A;   IR SINUS/FIST TUBE CHK-NON GI  09/19/2019   IR SINUS/FIST TUBE CHK-NON GI  09/19/2019  LAPAROTOMY N/A 09/05/2019   Procedure: EXPLORATORY LAPAROTOMY WITH [facility] AND GASTRIC BIOPSY;  Surgeon: Clovis Riley, MD;  Location: [facility];  Service: General;  Laterality: N/A;    Prior to Admission medications   Medication Sig Start Date End Date Taking? Authorizing Provider  aspirin 81 MG chewable tablet Chew 1 tablet (81 mg total) by mouth daily. 01/21/21   Gladys Damme, MD  atorvastatin (LIPITOR) 80 MG tablet TAKE 1 TABLET (80 MG TOTAL) BY MOUTH DAILY. 01/21/21 01/21/22  Gladys Damme, MD  ferrous sulfate 325 (65 FE) MG EC tablet Take 1 tablet (325 mg total) by mouth daily with breakfast. 05/12/21   Gatha Mayer, MD    Current Facility-Administered Medications  Medication Dose Route Frequency Provider Last Rate Last Admin   dextrose 5 %-0.9 % sodium chloride infusion   Intravenous Continuous Ezequiel Essex, MD 100 mL/hr at 06/06/21 0943 New Bag at  06/06/21 0943   feeding supplement (ENSURE ENLIVE / ENSURE PLUS) liquid 237 mL  237 mL Oral BID BM Ezequiel Essex, MD   237 mL at 06/06/21 0454   multivitamin with minerals tablet 1 tablet  1 tablet Oral Daily Ezequiel Essex, MD   1 tablet at 06/06/21 0958   [START ON 06/07/2021] thiamine (B-1) injection 100 mg  100 mg Intravenous Q24H Ezequiel Essex, MD       Current Outpatient Medications  Medication Sig Dispense Refill   aspirin 81 MG chewable tablet Chew 1 tablet (81 mg total) by mouth daily. 90 tablet 3   atorvastatin (LIPITOR) 80 MG tablet TAKE 1 TABLET (80 MG TOTAL) BY MOUTH DAILY. 90 tablet 3   ferrous sulfate 325 (65 FE) MG EC tablet Take 1 tablet (325 mg total) by mouth daily with breakfast. 90 tablet 3    Allergies as of 06/05/2021   (No Known Allergies)    Family History  Problem Relation Age of Onset   Colon cancer Neg Hx    Pancreatic cancer Neg Hx    Esophageal cancer Neg Hx     Social History   Socioeconomic History   Marital status: Single    Spouse name: Not on file   Number of children: Not on file   Years of education: Not on file   Highest education level: Not on file  Occupational History   Not on file  Tobacco Use   Smoking status: Every Day    Packs/day: 0.50    Types: Cigarettes   Smokeless tobacco: Never  Substance and Sexual Activity   Alcohol use: Yes    Comment: occasionally    Drug use: Not on file   Sexual activity: Not on file  Other Topics Concern   Not on file  Social History Narrative   08/2019 moved to [place] from [place]     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   OBJECTIVE    Physical Exam: Vital signs in last 24 hours: Temp:  [98.4 F (36.9 C)] 98.4 F (36.9 C) (10/23  0108) Pulse Rate:  [57-117] 109 (10/23 0900) Resp:  [16-36] 22 (10/23 0900) BP: (120-146)/(68-90) 145/74 (10/23 0900) SpO2:  [92 %-100 %] 96 % (10/23 0900) Weight:  [61.7 kg] 61.7 kg (10/22 2245)    General:  Alert emaciated male in NAD Psych:  Pleasant, cooperative. Normal mood and affect Eyes: Pupils equal, no icterus. Conjunctive pink Ears:  Normal auditory acuity Nose: No deformity, discharge or lesions Neck:  Supple, no masses felt Lungs:  Clear to auscultation.  Heart:  Regular rate, regular rhythm. No lower extremity edema Abdomen:  Soft, nondistended, moderate RLQ tenderness. bowel sounds, no masses felt Rectal :  Deferred Msk: Symmetrical without gross deformities.  Neurologic:  Alert, oriented, grossly normal neurologically Skin:  Intact without significant lesions.    Scheduled inpatient medications  feeding supplement  237 mL Oral BID BM   multivitamin with minerals  1 tablet Oral Daily   [START ON 06/07/2021] thiamine injection  100 mg Intravenous Q24H      Intake/Output from previous day: 10/22 0701 - 10/23 0700 In: 1000 [IV Piggyback:1000] Out: -  Intake/Output this shift: No intake/output data recorded.   Lab Results: Recent Labs    06/05/21 2328  WBC 30.5*  HGB 10.1*  HCT 32.5*  PLT 577*   BMET Recent Labs    06/05/21 2328  NA 133*  K 3.6  CL 96*  CO2 25  GLUCOSE 163*  BUN 14  CREATININE 0.97  CALCIUM 8.8*   LFTs Recent Labs    06/05/21 2328  PROT 6.9  ALBUMIN 2.2*  AST 40  ALT 31  ALKPHOS 216*  BILITOT 0.8   PT/INR No results for input(s): LABPROT, INR in the last 72 hours. Hepatitis Panel No results for input(s): HEPBSAG, HCVAB, HEPAIGM, HEPBIGM in the last 72 hours.   . CBC Latest Ref Rng & Units 06/05/2021 05/12/2021 01/21/2021   WBC 4.0 - 10.5 K/uL 30.5(H) 9.5 9.1  Hemoglobin 13.0 - 17.0 g/dL 10.1(L) 10.3(L) 10.1(L)  Hematocrit 39.0 - 52.0 % 32.5(L) 33.5(L) 33.0(L)  Platelets 150 - 400 K/uL 577(H) 335.0 340    . CMP Latest Ref Rng & Units 06/05/2021 10/20/2020 10/19/2020  Glucose 70 - 99 mg/dL 163(H) 84 96  BUN 8 - 23 mg/dL 14 9 10   Creatinine 0.61 - 1.24 mg/dL 0.97 0.92 1.08  Sodium 135 - 145 mmol/L 133(L) 137 137  Potassium 3.5 - 5.1 mmol/L 3.6 4.3 3.9  Chloride 98 - 111 mmol/L 96(L) 105 104  CO2 22 - 32 mmol/L 25 23 24   Calcium 8.9 - 10.3 mg/dL 8.8(L) 8.9 8.9  Total Protein 6.5 - 8.1 g/dL 6.9 - 6.2(L)  Total Bilirubin 0.3 - 1.2 mg/dL 0.8 - 0.6  Alkaline Phos 38 - 126 U/L 216(H) - 62  AST 15 - 41 U/L 40 - 13(L)  ALT 0 - 44 U/L 31 - 8     Active Problems:   Colonic mass    Tye Savoy, NP-C @  06/06/2021, 10:28 AM    Attending physician's note   I have taken an interval history, reviewed the chart and examined the patient. I agree with the Advanced Practitioner's note, impression and recommendations.   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  Chronic IDA  Significant wt loss (50lb over 1 year) with malnutrition (Alb 1.7)/cachexia  H/O perforated gastric pyloric ulcer s/p X-lap with [name] patch Jan 2021 with peritonitis. Had post-op perihepatic and  pelvic abscess req IR drains feb 2021 .  Plan: -IVF -Agree with IV Zosyn. -IR drainage in AM. Consult placed in Epic. -Sx consultation. -May need TPN if not able to maintain PO intake. -Follow BC -Check CEA -Will eventually need colon.  Timing to be determined depending upon clinical course. -Dr. Candis Schatz taking over GI service tomorrow.   Carmell Austria, MD [facility] [phone]

## 2021-06-06 NOTE — ED Provider Notes (Signed)
[facility] EMERGENCY DEPARTMENT Provider Note   CSN: [id number] Arrival date & time: 06/05/21  2239     History Chief Complaint  Patient presents with   Failure To [place] is a 80 y.o. male.  The history is provided by the patient.  Weakness Severity:  Moderate Onset quality:  Gradual Timing:  Constant Chronicity:  New Relieved by:  Nothing Worsened by:  Activity Associated symptoms: abdominal pain, anorexia and nausea   Associated symptoms: no chest pain, no dysuria, no fever and no vomiting   Patient presents with generalized weakness for several weeks.  Patient reports very poor appetite.  He reports diffuse body pain.  He reports back pain when he stands up.  He reports his right leg is felt weaker over the past 5 days. He also reports some lower abdominal pain.  No new medications.    Past Medical History:  Diagnosis Date   Acute renal insufficiency 09/05/2019   Perforated gastric ulcer s/p omental Graham patch 09/05/2019 09/05/2019    Patient Active Problem List   Diagnosis Date Noted   Anemia 01/21/2021   Inguinal hernia of right side without obstruction or gangrene 01/21/2021   H. 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   Pneumoperitoneum 09/05/2019   Acute renal insufficiency 09/05/2019   Perforated gastric ulcer s/p omental Graham patch 09/05/2019 09/05/2019   Coagulopathy ([place]) 09/05/2019    Past Surgical History:  Procedure Laterality Date   BOWEL RESECTION N/A 09/05/2019   Procedure: SMALL BOWEL RESECTION;  Surgeon: Clovis Riley, MD;  Location: [facility];  Service: General;  Laterality: N/A;   IR SINUS/FIST TUBE CHK-NON GI  09/19/2019   IR SINUS/FIST TUBE CHK-NON GI  09/19/2019   LAPAROTOMY N/A 09/05/2019   Procedure: EXPLORATORY LAPAROTOMY WITH [facility] AND GASTRIC  BIOPSY;  Surgeon: Clovis Riley, MD;  Location: [facility];  Service: General;  Laterality: N/A;       Family History  Problem Relation Age of Onset   Colon cancer Neg Hx    Pancreatic cancer Neg Hx    Esophageal cancer Neg Hx     Social History   Tobacco Use   Smoking status: Every Day    Packs/day: 0.50    Types: Cigarettes   Smokeless tobacco: Never  Substance Use Topics   Alcohol use: Yes    Comment: occasionally     Home Medications Prior to Admission medications   Medication Sig Start Date End Date Taking? Authorizing Provider  aspirin 81 MG chewable tablet Chew 1 tablet (81 mg total) by mouth daily. 01/21/21   Gladys Damme, MD  atorvastatin (LIPITOR) 80 MG tablet TAKE 1 TABLET (80 MG TOTAL) BY MOUTH DAILY. 01/21/21 01/21/22  Gladys Damme, MD  ferrous sulfate 325 (65 FE) MG EC tablet Take 1 tablet (325 mg total) by mouth daily with breakfast. 05/12/21   Gatha Mayer, MD    Allergies    Patient has no known allergies.  Review of Systems   Review of Systems  Constitutional:  Positive for fatigue. Negative for fever.  Cardiovascular:  Negative for chest pain.  Gastrointestinal:  Positive for abdominal pain, anorexia and nausea. Negative for vomiting.  Genitourinary:  Negative for dysuria and testicular pain.  Neurological:  Positive for weakness.  All other systems reviewed and are negative.  Physical Exam Updated Vital Signs  BP 127/75   Pulse 95   Temp 98.4 F (36.9 C) (Oral)   Resp (!) 21   Ht 1.803 m (5\' 11" )   Wt 61.7 kg   SpO2 94%   BMI 18.97 kg/m   Physical Exam CONSTITUTIONAL: Elderly, frail and cachectic HEAD: Normocephalic/atraumatic EYES: EOMI/PERRL ENMT: Mucous membranes dry NECK: supple no meningeal signs SPINE/BACK:entire spine nontender CV: S1/S2 noted, tachycardic LUNGS: Lungs are clear to auscultation bilaterally, no apparent distress ABDOMEN: soft, moderate tenderness in the right lower quadrant with a firm mass noted, no  hernias, no rebound or guarding, bowel sounds noted throughout abdomen GU:no cva tenderness NEURO: Pt is awake/alert/appropriate,   No facial droop.  Patient has mild weakness with right hip flexion.  He is able to plantar and dorsiflex the right foot without difficulty.  There is no sensory deficits He is able to move his other extremities out difficulty EXTREMITIES: pulses normal/equal, full ROM, no deformities SKIN: warm, color normal PSYCH: no abnormalities of mood noted, alert and oriented to situation  ED Results / Procedures / Treatments   Labs (all labs ordered are listed, but only abnormal results are displayed) Labs Reviewed  COMPREHENSIVE METABOLIC PANEL - Abnormal; Notable for the following components:      Result Value   Sodium 133 (*)    Chloride 96 (*)    Glucose, Bld 163 (*)    Calcium 8.8 (*)    Albumin 2.2 (*)    Alkaline Phosphatase 216 (*)    All other components within normal limits  CBC WITH DIFFERENTIAL/PLATELET - Abnormal; Notable for the following components:   WBC 30.5 (*)    Hemoglobin 10.1 (*)    HCT 32.5 (*)    MCV 67.6 (*)    MCH 21.0 (*)    Platelets 577 (*)    Neutro Abs 27.1 (*)    Monocytes Absolute 1.7 (*)    Abs Immature Granulocytes 0.32 (*)    All other components within normal limits  URINALYSIS, ROUTINE W REFLEX MICROSCOPIC - Abnormal; Notable for the following components:   Color, Urine AMBER (*)    APPearance HAZY (*)    Specific Gravity, Urine 1.035 (*)    Protein, ur 30 (*)    Bacteria, UA RARE (*)    All other components within normal limits  RESP PANEL BY RT-PCR (FLU A&B, COVID) ARPGX2  LIPASE, BLOOD  CK  TROPONIN I (HIGH SENSITIVITY)  TROPONIN I (HIGH SENSITIVITY)    EKG EKG Interpretation  Date/Time:  Saturday June 05 2021 23:00:12 EDT Ventricular Rate:  129 PR Interval:  120 QRS Duration: 82 QT Interval:  286 QTC Calculation: 418 R Axis:   78 Text Interpretation: Sinus tachycardia Nonspecific ST and T wave  abnormality Abnormal ECG Confirmed by Ripley Fraise [id number]) on 06/06/2021 12:12:10 AM  Radiology CT ABDOMEN PELVIS W CONTRAST  Result Date: 06/06/2021 CLINICAL DATA:  Abdominal pain. EXAM: CT ABDOMEN AND PELVIS WITH CONTRAST TECHNIQUE: Multidetector CT imaging of the abdomen and pelvis was performed using the standard protocol following bolus administration of intravenous contrast. CONTRAST:  43mL OMNIPAQUE IOHEXOL 350 MG/ML SOLN COMPARISON:  CT abdomen pelvis dated 09/17/2019. FINDINGS: Lower chest: Right lung base linear atelectasis/scarring. The visualized lung bases are otherwise clear. Coronary vascular calcification. No intra-abdominal free air.  No significant free fluid. Hepatobiliary: A 1 cm left hepatic cyst. No intrahepatic biliary dilatation. The gallbladder is unremarkable. Pancreas: Unremarkable. No pancreatic ductal dilatation or surrounding inflammatory changes. Spleen: Normal in size without focal abnormality. Adrenals/Urinary  Tract: The adrenal glands are unremarkable. There is no hydronephrosis on either side. There is symmetric enhancement and excretion of contrast by both kidneys. The urinary bladder is grossly unremarkable. Stomach/Bowel: Evaluation of the bowel is limited due to paucity of intra-abdominal fat. There is no bowel obstruction. There is masslike thickening and irregularity of the cecum and proximal ascending colon most concerning for malignancy. Further evaluation with colonoscopy is recommended. The appendix is not identified. Vascular/Lymphatic: Mild aortoiliac atherosclerotic disease. The IVC is grossly unremarkable. No portal venous gas. There is no adenopathy. 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 The prostate gland measures 5.7 cm in transverse diameter. Other: Multilobulated low attenuating mass or collection appears somewhat contiguous with the cecum and proximal ascending colon and extends into the  right iliacus and distal psoas muscle measuring approximately 9 x 9 cm in greatest axial dimensions and 13 cm in craniocaudal length. This is concerning for a necrotic malignancy although an infected collection or abscess is not excluded. Musculoskeletal: There is osteopenia. No acute osseous pathology. Severe cachexia. IMPRESSION: 1. Masslike thickening and irregularity of the cecum and proximal ascending colon most concerning for malignancy. Further evaluation with colonoscopy is recommended. 2. Multilobulated low attenuating mass or collection appears somewhat contiguous with the cecum and proximal ascending colon and extends into the right iliacus and distal psoas muscle. This is concerning for a necrotic malignancy although an infected collection or abscess is not excluded. 3. 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 4. Aortic Atherosclerosis (ICD10-I70.0). Electronically Signed   By: Anner Crete M.D.   On: 06/06/2021 03:33   DG Abdomen Acute W/Chest  Result Date: 06/06/2021 CLINICAL DATA:  Right lower quadrant abdominal pain. Fatigue. Weakness. History of perforated gastric ulcer. EXAM: DG ABDOMEN ACUTE WITH 1 VIEW CHEST COMPARISON:  Abdominopelvic CT and chest radiograph 09/17/2019 FINDINGS: Normal heart size. Unchanged mediastinal contours. No acute airspace disease. No pleural effusion or pneumothorax. No pulmonary edema. No free intra-abdominal air. No bowel dilatation to suggest obstruction. There is air scattered throughout nondilated small and large bowel. No visualized radiopaque calculi. The bones are under mineralized without acute osseous abnormality. IMPRESSION: 1. No bowel obstruction or free air. 2. No acute intrathoracic process. Electronically Signed   By: Keith Rake M.D.   On: 06/06/2021 00:39    Procedures Procedures   Medications Ordered in ED Medications  lactated ringers bolus 1,000 mL (0 mLs Intravenous Stopped 06/06/21  0300)  ondansetron (ZOFRAN) injection 4 mg (4 mg Intravenous Given 06/06/21 0129)  lactated ringers bolus 1,000 mL (1,000 mLs Intravenous New Bag/Given 06/06/21 0336)  iohexol (OMNIPAQUE) 350 MG/ML injection 100 mL (75 mLs Intravenous Contrast Given 06/06/21 0321)  piperacillin-tazobactam (ZOSYN) IVPB 3.375 g (3.375 g Intravenous New Bag/Given 06/06/21 0408)    ED Course  I have reviewed the triage vital signs and the nursing notes.  Pertinent labs & imaging results that were available during my care of the patient were reviewed by me and considered in my medical decision making (see chart for details).    MDM Rules/Calculators/A&P                           Patient presents with generalized weakness and fatigue and myalgias for several weeks.  Patient is cachectic and ill-appearing.  He is tachycardic. On exam he is focally tender in his right lower quadrant, will proceed with CT imaging  Patient clarified that he did not have urinary or fecal incontinence.  He reports that he made it to the toilet and had diarrhea.  He never had any other episodes.  He also reports he has been passing urine without difficulty 4:52 AM CT imaging with extensive findings including a likely mass of the cecum, and also necrotic lesions.  Patient has elevated leukocytosis, will start antibiotics Discussed the case with Dr. Bobbye Morton with general surgery She has reviewed the CT imaging. She recommends medical admission, gastroenterology evaluation and will likely need colonoscopy.  Patient reports he was supposed to have colonoscopy at the end of this month. Patient is is seen by family practice No indication for emergent operative management 5:14 AM I discussed the case with the on-call family medicine resident who will admit to the hospital Final Clinical Impression(s) / ED Diagnoses Final diagnoses:  Colonic mass  Dehydration  Failure to thrive in adult    Rx / DC Orders ED Discharge Orders     None         Ripley Fraise, MD 06/06/21 [phone]

## 2021-06-06 NOTE — Progress Notes (Signed)
Family medicine teaching service will be admitting this patient. Our pager information can be located in the physician sticky notes, treatment team sticky notes, and the headers of all our official daily progress notes.   FAMILY MEDICINE TEACHING SERVICE Patient - Please contact intern pager [phone] or text page via website [email or web address] (login: [email or web address]) for questions regarding care. DO NOT page listed attending provider unless there is no answer from the number above.   Phinneas Shakoor, MD PGY-2, [place] [facility] pager [phone]   

## 2021-06-06 NOTE — ED Notes (Signed)
Pt hand off report placed. Pt has not been approved. Called to reach out concerning delay. RN to review chart and call back if any questions. Provided with name and call back number

## 2021-06-06 NOTE — Hospital Course (Addendum)
Albert Love is a 80 y.o. male presenting with 3-week history of worsening bowel pain, weakness, and fatigue. 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   Colonic mass  Abdominal pain  Leukocytosis This patient presents to the ED with a 3-week history of worsening abdominal pain, weakness, fatigue, and constipation. On presentation to the ED, he was noted to be afebrile, tachycardic to 108, and normotensive.  Labs notable for albumin 2.2, alk phos 216, WBC 30.5, hemoglobin 10.1, MCV 67.6, platelets 577.  CT imaging concerning for colonic/cecal mass, most likely malignant but also possibly infectious in nature.  Did receive Zosyn in ED, unfortunately no blood cultures collected prior to administration.  He has a complicated abdominal history, discussed below.  Was hospitalized in January 2021 and underwent a handful of abdominal surgeries.  At that time, was also noted to have thickening of the inferior portion of the cecum, unsure whether inflammatory or neoplastic.  He was lost to GI follow-up and did not get repeat endoscopy and H. pylori testing as planned.  Has been seeing his PCP, who in July noted his worsening microcytic anemia of unknown etiology and encouraged him to set up appointment with GI.  Had appointment scheduled with GI for the end of this month, however he came into the ED yesterday for worsening pain.  At the top top differential diagnosis includes colonic malignancy, but differentials also include abscess.  Consulted GI, they will perform colonoscopy tomorrow (Monday 10/24). - admit to med surg floor with Dr. Owens Shark attending - consult GI, appreciate recommendations -Clear liquid diet - N.p.o. at midnight in preparation for colonoscopy Monday - PT/OT eval and treat - follow  up magnesium, GGT, CMP, CBC at 12 PM - AM CBC, CMP, magnesium - Delirium precautions   Hx perforated gastric ulcer s/p omental Graham patch 09/05/2019  Pneumoperitoneum Hospitalized January 2021 with perforated pyloric ulcer and diffuse purulent peritonitis.  Diagnosed with H. pylori. Underwent exploratory laparotomy, gastric biopsy, omental pedicel flap [name] patch) repair of perforated pyloric ulcer on 09/05/2019 with Dr. Jens Som. Subsequently underwent IR drain placement x2 on 09/14/2019, removed 09/19/2019.  During this admission, was noted to have thickening of the inferior portion of the cecum, unsure whether to be inflammatory or neoplastic. - Management as discussed above - Consult GI, appreciate evaluation and management   Cachexia  Hypoalbuminemia secondary to protein calorie malnutrition  Admission albumin 2.2. Osteopenia noted on CT imaging. Cachexia apparent on physical exam.  - Consult nutrition for dietary recommendations - Daily multivitamin - Ensure twice daily between meals - Possibility of refeeding syndrome, will need daily CMP and mag checks   Hypertension BP on admission normotensive, last 130/72.  Patient is intermittently tachycardic up to 108.  Has a documented history of hypertension while hospitalized in early 2022.  Was originally started on amlodipine, but PCP notes throughout the year indicate intermittent compliance.  On admission, patient cannot tell me what medications he currently takes.  Given normotension on admission, declined to start any antihypertensive at this time. - Vitals per unit routine   Microcytic anemia Admission Hgb 10.1, MCV 67.6. Chart review indicates in last year, hemoglobin has been 11-12  and MCV mid-80s but slowly down-trending.  B12 and ferritin within normal limits (291, 14.9 respectively) when checked by GI on 05/12/2021.  Chart review indicates he reported some dark  stools and constipation to PCP in July.  On admission, patient  reports constipation but denies blood per rectum or dark stools at this time. - Daily CBC - Continue home iron supplementation s/p colonoscopy   Thrombocytopenia Platelets this morning 577.  In the last two years, has ranged 147-447; however since 11/02/2019 has slowly trended upwards.  Possibly related to colonic mass discussed above. - Daily CBC   Central retinal artery occlusion of right eye  HLD Is prescribed aspirin 80 mg daily and atorvastatin 80 mg daily; PCP visit notes indicate intermittent adherence.  Had appointment with ophthalmologist in September.  Patient cannot remember what meds he is taking or whether he has missed any lately. - Continue atorvastatin 80 mg daily - Hold aspirin 81 mg daily   Right inguinal hernia Noted at PCP visit June 2022.  Couple months duration.  Easily reducible.  Will monitor throughout this admission.   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  Wonder if pelvic lesions related to cecal mass.  We will continue to follow on imaging.   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 Previous CT abd/pelv 09/17/2019 indicate severely enlarged prostate gland which is stable.  Patient reports urinating normally and without issue.  Will suggest outpatient follow-up.   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  Follow-up 10 to 14 days with IR clinic. D/C reglan after 12 wks

## 2021-06-06 NOTE — H&P (Signed)
[facility] Admission History and Physical Service Pager: [phone]  Patient name: Albert Love record number: [id number] Date of birth: Nov 12, 1940 Age: 80 y.o. Gender: male  Primary Care Provider: Gladys Damme, MD Consultants: GI, general surgery Code Status: Full code Preferred Emergency Contact: Nephew [name] [phone]  Albert Love voiced that his nephew [name] should be his medical decision maker if needed.   Chief Complaint: Abdominal pain, weakness, fatigue  Assessment and Plan: Albert Love is a 80 y.o. male presenting with 3-week history of worsening bowel pain, weakness, and fatigue. 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  Colonic mass  Abdominal pain  Leukocytosis This patient presents to the ED with a 3-week history of worsening abdominal pain, weakness, fatigue, and constipation. On presentation to the ED, he was noted to be afebrile, tachycardic to 108, and normotensive.  Labs notable for albumin 2.2, alk phos 216, WBC 30.5, hemoglobin 10.1, MCV 67.6, platelets 577.  CT imaging concerning for colonic/cecal mass, most likely malignant but also possibly infectious in nature.  Did receive Zosyn in ED, unfortunately no blood cultures collected prior to administration.  He has a complicated abdominal history, discussed below.  Was hospitalized in January 2021 and underwent a handful of abdominal surgeries.  At that time, was also noted to have thickening of the inferior portion of the cecum, unsure whether inflammatory or neoplastic.  He was lost to GI follow-up and did not get repeat endoscopy and H. pylori testing as planned.  Has been seeing his PCP, who in July noted his worsening microcytic anemia of unknown  etiology and encouraged him to set up appointment with GI.  Had appointment scheduled with GI for the end of this month, however he came into the ED yesterday for worsening pain.  At the top top differential diagnosis includes colonic malignancy, but differentials also include abscess.  Consulted GI, they will perform colonoscopy tomorrow (Monday 10/24). - admit to med surg floor with Dr. Owens Shark attending - consult GI, appreciate recommendations -Clear liquid diet - N.p.o. at midnight in preparation for colonoscopy Monday - PT/OT eval and treat - follow up magnesium, GGT, CMP, CBC at 12 PM - AM CBC, CMP, magnesium - Delirium precautions  Hx perforated gastric ulcer s/p omental Graham patch 09/05/2019  Pneumoperitoneum Hospitalized January 2021 with perforated pyloric ulcer and diffuse purulent peritonitis.  Diagnosed with H. pylori. Underwent exploratory laparotomy, gastric biopsy, omental pedicel flap [name] patch) repair of perforated pyloric ulcer on 09/05/2019 with Dr. Jens Som. Subsequently underwent IR drain placement x2 on 09/14/2019, removed 09/19/2019.  During this admission, was noted to have thickening of the inferior portion of the cecum, unsure whether to be inflammatory or neoplastic. - Management as discussed above - Consult GI, appreciate evaluation and management  Cachexia  Hypoalbuminemia secondary to protein calorie malnutrition  Admission albumin 2.2. Osteopenia noted on CT imaging. Cachexia apparent on physical exam.  - Consult nutrition for dietary recommendations - Daily multivitamin - Ensure twice daily between meals - Possibility of refeeding syndrome, will need daily CMP and mag checks  Hypertension BP on admission normotensive, last 130/72.  Patient is intermittently tachycardic up to 108.  Has a documented history of hypertension while hospitalized in early 2022.  Was originally started on amlodipine, but PCP notes throughout the year indicate intermittent  compliance.  On  admission, patient cannot tell me what medications he currently takes.  Given normotension on admission, declined to start any antihypertensive at this time. - Vitals per unit routine  Microcytic anemia Admission Hgb 10.1, MCV 67.6. Chart review indicates in last year, hemoglobin has been 11-12  and MCV mid-80s but slowly down-trending.  B12 and ferritin within normal limits (291, 14.9 respectively) when checked by GI on 05/12/2021.  Chart review indicates he reported some dark stools and constipation to PCP in July.  On admission, patient reports constipation but denies blood per rectum or dark stools at this time. - Daily CBC - Continue home iron supplementation s/p colonoscopy  Thrombocytopenia Platelets this morning 577.  In the last two years, has ranged 147-447; however since 11/02/2019 has slowly trended upwards.  Possibly related to colonic mass discussed above. - Daily CBC  Central retinal artery occlusion of right eye  HLD Is prescribed aspirin 80 mg daily and atorvastatin 80 mg daily; PCP visit notes indicate intermittent adherence.  Had appointment with ophthalmologist in September.  Patient cannot remember what meds he is taking or whether he has missed any lately. - Continue atorvastatin 80 mg daily - Hold aspirin 81 mg daily  Right inguinal hernia Noted at PCP visit June 2022.  Couple months duration.  Easily reducible.  Will monitor throughout this admission.  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  Wonder if pelvic lesions related to cecal mass.  We will continue to follow on imaging.  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 Previous CT abd/pelv 09/17/2019 indicate severely enlarged prostate  gland which is stable.  Patient reports urinating normally and without issue.  Will suggest outpatient follow-up.   FEN/GI: Clear liquids until after colonoscopy Prophylaxis: SCD until after colonoscopy  Disposition: Med surg  History of Present Illness:  Albert Love is a 80 y.o. male presenting with fatigue and abdominal pain.  He reports 3-week history of worsening appetite loss, abdominal pain, back pain, and constipation.  Feels very weak all over.  No bowel movement in 3 days.  He cannot recall medications he supposed be taking.  Not sure if he has missed any days, but says "probably".  He normally manages her medications, however recently with his 3 weeks of weakness his nephew has stepped and helped with medications.  Review Of Systems: Per HPI with the following additions:   Review of Systems  Constitutional:  Positive for appetite change, chills and fatigue.  Gastrointestinal:  Positive for abdominal pain.  Endocrine: Positive for cold intolerance.  Neurological:  Positive for weakness.    Patient Active Problem List   Diagnosis Date Noted   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   Pneumoperitoneum 09/05/2019   Acute renal insufficiency 09/05/2019   Perforated gastric ulcer s/p omental Graham patch 09/05/2019 09/05/2019   Coagulopathy ([place]) 09/05/2019    Past Medical History: Past Medical History:  Diagnosis Date   Acute renal insufficiency 09/05/2019   Perforated gastric ulcer s/p omental [name] patch 09/05/2019 09/05/2019    Past Surgical History:  Past Surgical History:  Procedure Laterality Date   BOWEL RESECTION N/A 09/05/2019   Procedure: SMALL BOWEL RESECTION;  Surgeon: Clovis Riley,  MD;  Location: [facility];  Service: General;  Laterality: N/A;   IR SINUS/FIST TUBE CHK-NON GI  09/19/2019   IR SINUS/FIST TUBE CHK-NON GI  09/19/2019   LAPAROTOMY N/A 09/05/2019   Procedure: EXPLORATORY LAPAROTOMY WITH [facility] AND GASTRIC BIOPSY;  Surgeon: Clovis Riley, MD;  Location: [facility];  Service: General;  Laterality: N/A;    Social History: Social History   Tobacco Use   Smoking status: Every Day    Packs/day: 0.50    Types: Cigarettes   Smokeless tobacco: Never  Substance Use Topics   Alcohol use: Yes    Comment: occasionally    Additional social history: None Please also refer to relevant sections of EMR.  Family History: Family History  Problem Relation Age of Onset   Colon cancer Neg Hx    Pancreatic cancer Neg Hx    Esophageal cancer Neg Hx     Allergies and Medications: No Known Allergies No current facility-administered medications on file prior to encounter.   Current Outpatient Medications on File Prior to Encounter  Medication Sig Dispense Refill   aspirin 81 MG chewable tablet Chew 1 tablet (81 mg total) by mouth daily. 90 tablet 3   atorvastatin (LIPITOR) 80 MG tablet TAKE 1 TABLET (80 MG TOTAL) BY MOUTH DAILY. 90 tablet 3   ferrous sulfate 325 (65 FE) MG EC tablet Take 1 tablet (325 mg total) by mouth daily with breakfast. 90 tablet 3    Objective: BP (!) 145/74   Pulse (!) 109   Temp 98.4 F (36.9 C) (Oral)   Resp (!) 22   Ht 5' 11"  (1.803 m)   Wt 61.7 kg   SpO2 96%   BMI 18.97 kg/m  Exam: General: Awake, fatigued appearing, cachectic, curled up in right lateral decubitus position Eyes: Sclera anicteric ENTM: Temporal wasting, top and bottom full dentures in place, Mallampati score 2 Cardiovascular: Distant heart sounds, regular rate and rhythm, no murmurs appreciated Respiratory: Clear to auscultation bilaterally, difficult to auscultate with poor respiratory effort Gastrointestinal: Bowel sounds present, midline linear vertical  abdominal scar present, exquisite TTP in suprapubic and right quadrants, no rebound tenderness MSK: Globally wasted muscle mass, difficulty with raising himself from bed, turning over, reaching for urinal Derm: No lesions or ulcerations appreciated Neuro: Cranial nerves II through X grossly intact Psych: Alert, ANO x4, understands ED course and concerning imaging findings  Labs and Imaging: CBC BMET  Recent Labs  Lab 06/05/21 2328  WBC 30.5*  HGB 10.1*  HCT 32.5*  PLT 577*   Recent Labs  Lab 06/05/21 2328  NA 133*  K 3.6  CL 96*  CO2 25  BUN 14  CREATININE 0.97  GLUCOSE 163*  CALCIUM 8.8*     EKG: Sinus tachycardia at 129 bpm, narrow QRS, no ST segment elevation   DG ABDOMEN ACUTE WITH 1 VIEW CHEST 06/06/21 CLINICAL DATA:  Right lower quadrant abdominal pain. Fatigue. Weakness. History of perforated gastric ulcer. COMPARISON:  Abdominopelvic CT and chest radiograph 09/17/2019 FINDINGS: Normal heart size. Unchanged mediastinal contours. No acute airspace disease. No pleural effusion or pneumothorax. No pulmonary edema. No free intra-abdominal air. No bowel dilatation to suggest obstruction. There is air scattered throughout nondilated small and large bowel. No visualized radiopaque calculi. The bones are under mineralized without acute osseous abnormality. IMPRESSION: 1. No bowel obstruction or free  air. 2. No acute intrathoracic process.  CT ABDOMEN AND PELVIS WITH CONTRAST 06/06/21 CONTRAST:  66m OMNIPAQUE IOHEXOL 350 MG/ML SOLN COMPARISON:  CT abdomen pelvis dated 09/17/2019. FINDINGS: Lower chest: Right lung base linear atelectasis/scarring. The visualized lung bases are otherwise clear. Coronary vascular calcification. No intra-abdominal free air.  No significant free fluid. Hepatobiliary: A 1 cm left hepatic cyst. No intrahepatic biliary dilatation. The gallbladder is unremarkable. Pancreas: Unremarkable. No pancreatic ductal dilatation or surrounding  inflammatory changes. Spleen: Normal in size without focal abnormality. Adrenals/Urinary Tract: The adrenal glands are unremarkable. There is no hydronephrosis on either side. There is symmetric enhancement and excretion of contrast by both kidneys. The urinary bladder is grossly unremarkable. Stomach/Bowel: Evaluation of the bowel is limited due to paucity of intra-abdominal fat. There is no bowel obstruction. There is masslike thickening and irregularity of the cecum and proximal ascending colon most concerning for malignancy. Further evaluation with colonoscopy is recommended. The appendix is not identified. Vascular/Lymphatic: Mild aortoiliac atherosclerotic disease. The IVC is grossly unremarkable. No portal venous gas. There is no adenopathy. 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 The prostate gland measures 5.7 cm in transverse diameter. Other: Multilobulated low attenuating mass or collection appears somewhat contiguous with the cecum and proximal ascending colon and extends into the right iliacus and distal psoas muscle measuring approximately 9 x 9 cm in greatest axial dimensions and 13 cm in craniocaudal length. This is concerning for a necrotic malignancy although an infected collection or abscess is not excluded. Musculoskeletal: There is osteopenia. No acute osseous pathology. Severe cachexia. IMPRESSION: 1. Masslike thickening and irregularity of the cecum and proximal ascending colon most concerning for malignancy. Further evaluation with colonoscopy is recommended. 2. Multilobulated low attenuating mass or collection appears somewhat contiguous with the cecum and proximal ascending colon and extends into the right iliacus and distal psoas muscle. This is concerning for a necrotic malignancy although an infected collection or abscess is not excluded. 3. Enlarged prostate gland with median lobe hypertrophy  indenting the base of the bladder. 4. Aortic Atherosclerosis (ICD10-I70.0).    LEzequiel Essex MD 06/06/2021, 9:05 AM PGY-2, [place] pager: [phone] text pages welcome

## 2021-06-07 ENCOUNTER — Observation Stay (HOSPITAL_COMMUNITY): Payer: Medicare Other

## 2021-06-07 ENCOUNTER — Encounter (HOSPITAL_COMMUNITY): Payer: Self-pay | Admitting: Family Medicine

## 2021-06-07 DIAGNOSIS — K6819 Other retroperitoneal abscess: Secondary | ICD-10-CM | POA: Diagnosis not present

## 2021-06-07 DIAGNOSIS — R627 Adult failure to thrive: Secondary | ICD-10-CM | POA: Diagnosis not present

## 2021-06-07 DIAGNOSIS — R64 Cachexia: Secondary | ICD-10-CM | POA: Diagnosis not present

## 2021-06-07 DIAGNOSIS — K651 Peritoneal abscess: Secondary | ICD-10-CM | POA: Diagnosis not present

## 2021-06-07 DIAGNOSIS — D509 Iron deficiency anemia, unspecified: Secondary | ICD-10-CM | POA: Diagnosis not present

## 2021-06-07 DIAGNOSIS — C180 Malignant neoplasm of cecum: Secondary | ICD-10-CM | POA: Diagnosis not present

## 2021-06-07 DIAGNOSIS — Z66 Do not resuscitate: Secondary | ICD-10-CM | POA: Diagnosis not present

## 2021-06-07 DIAGNOSIS — I10 Essential (primary) hypertension: Secondary | ICD-10-CM | POA: Diagnosis not present

## 2021-06-07 DIAGNOSIS — E860 Dehydration: Secondary | ICD-10-CM | POA: Diagnosis present

## 2021-06-07 DIAGNOSIS — M8580 Other specified disorders of bone density and structure, unspecified site: Secondary | ICD-10-CM | POA: Diagnosis present

## 2021-06-07 DIAGNOSIS — K4090 Unilateral inguinal hernia, without obstruction or gangrene, not specified as recurrent: Secondary | ICD-10-CM | POA: Diagnosis present

## 2021-06-07 DIAGNOSIS — D630 Anemia in neoplastic disease: Secondary | ICD-10-CM | POA: Diagnosis present

## 2021-06-07 DIAGNOSIS — D696 Thrombocytopenia, unspecified: Secondary | ICD-10-CM | POA: Diagnosis not present

## 2021-06-07 DIAGNOSIS — E785 Hyperlipidemia, unspecified: Secondary | ICD-10-CM | POA: Diagnosis present

## 2021-06-07 DIAGNOSIS — D75839 Thrombocytosis, unspecified: Secondary | ICD-10-CM | POA: Diagnosis present

## 2021-06-07 DIAGNOSIS — E8809 Other disorders of plasma-protein metabolism, not elsewhere classified: Secondary | ICD-10-CM | POA: Diagnosis not present

## 2021-06-07 DIAGNOSIS — R54 Age-related physical debility: Secondary | ICD-10-CM | POA: Diagnosis present

## 2021-06-07 DIAGNOSIS — A419 Sepsis, unspecified organism: Secondary | ICD-10-CM | POA: Diagnosis not present

## 2021-06-07 DIAGNOSIS — Z7189 Other specified counseling: Secondary | ICD-10-CM | POA: Diagnosis not present

## 2021-06-07 DIAGNOSIS — L0291 Cutaneous abscess, unspecified: Secondary | ICD-10-CM | POA: Diagnosis not present

## 2021-06-07 DIAGNOSIS — K6389 Other specified diseases of intestine: Secondary | ICD-10-CM | POA: Diagnosis not present

## 2021-06-07 DIAGNOSIS — Z681 Body mass index (BMI) 19 or less, adult: Secondary | ICD-10-CM | POA: Diagnosis not present

## 2021-06-07 DIAGNOSIS — N400 Enlarged prostate without lower urinary tract symptoms: Secondary | ICD-10-CM | POA: Diagnosis present

## 2021-06-07 DIAGNOSIS — K5900 Constipation, unspecified: Secondary | ICD-10-CM | POA: Diagnosis not present

## 2021-06-07 DIAGNOSIS — Z20822 Contact with and (suspected) exposure to covid-19: Secondary | ICD-10-CM | POA: Diagnosis not present

## 2021-06-07 DIAGNOSIS — M8888 Osteitis deformans of other bones: Secondary | ICD-10-CM | POA: Diagnosis present

## 2021-06-07 DIAGNOSIS — Z515 Encounter for palliative care: Secondary | ICD-10-CM | POA: Diagnosis not present

## 2021-06-07 DIAGNOSIS — D490 Neoplasm of unspecified behavior of digestive system: Secondary | ICD-10-CM | POA: Diagnosis not present

## 2021-06-07 DIAGNOSIS — R195 Other fecal abnormalities: Secondary | ICD-10-CM | POA: Diagnosis present

## 2021-06-07 DIAGNOSIS — E46 Unspecified protein-calorie malnutrition: Secondary | ICD-10-CM | POA: Diagnosis not present

## 2021-06-07 DIAGNOSIS — E43 Unspecified severe protein-calorie malnutrition: Secondary | ICD-10-CM | POA: Diagnosis not present

## 2021-06-07 LAB — CBC WITH DIFFERENTIAL/PLATELET
Abs Immature Granulocytes: 0 10*3/uL (ref 0.00–0.07)
Basophils Absolute: 0 10*3/uL (ref 0.0–0.1)
Basophils Relative: 0 %
Eosinophils Absolute: 0.3 10*3/uL (ref 0.0–0.5)
Eosinophils Relative: 1 %
HCT: 27.6 % — ABNORMAL LOW (ref 39.0–52.0)
Hemoglobin: 8.8 g/dL — ABNORMAL LOW (ref 13.0–17.0)
Lymphocytes Relative: 6 %
Lymphs Abs: 1.8 10*3/uL (ref 0.7–4.0)
MCH: 21.1 pg — ABNORMAL LOW (ref 26.0–34.0)
MCHC: 31.9 g/dL (ref 30.0–36.0)
MCV: 66.2 fL — ABNORMAL LOW (ref 80.0–100.0)
Monocytes Absolute: 1.5 10*3/uL — ABNORMAL HIGH (ref 0.1–1.0)
Monocytes Relative: 5 %
Neutro Abs: 26.4 10*3/uL — ABNORMAL HIGH (ref 1.7–7.7)
Neutrophils Relative %: 88 %
Platelets: 456 10*3/uL — ABNORMAL HIGH (ref 150–400)
RBC: 4.17 MIL/uL — ABNORMAL LOW (ref 4.22–5.81)
RDW: 15.5 % (ref 11.5–15.5)
WBC: 30 10*3/uL — ABNORMAL HIGH (ref 4.0–10.5)
nRBC: 0 % (ref 0.0–0.2)
nRBC: 0 /100 WBC

## 2021-06-07 LAB — COMPREHENSIVE METABOLIC PANEL
ALT: 28 U/L (ref 0–44)
AST: 45 U/L — ABNORMAL HIGH (ref 15–41)
Albumin: 1.8 g/dL — ABNORMAL LOW (ref 3.5–5.0)
Alkaline Phosphatase: 106 U/L (ref 38–126)
Anion gap: 9 (ref 5–15)
BUN: 9 mg/dL (ref 8–23)
CO2: 26 mmol/L (ref 22–32)
Calcium: 8.1 mg/dL — ABNORMAL LOW (ref 8.9–10.3)
Chloride: 100 mmol/L (ref 98–111)
Creatinine, Ser: 0.72 mg/dL (ref 0.61–1.24)
GFR, Estimated: >60 mL/min (ref >60)
Glucose, Bld: 117 mg/dL — ABNORMAL HIGH (ref 70–99)
Potassium: 3.2 mmol/L — ABNORMAL LOW (ref 3.5–5.1)
Sodium: 135 mmol/L (ref 135–145)
Total Bilirubin: 0.7 mg/dL (ref 0.3–1.2)
Total Protein: 5.7 g/dL — ABNORMAL LOW (ref 6.5–8.1)

## 2021-06-07 LAB — MAGNESIUM: Magnesium: 1.9 mg/dL (ref 1.7–2.4)

## 2021-06-07 LAB — GLUCOSE, CAPILLARY: Glucose-Capillary: 130 mg/dL — ABNORMAL HIGH (ref 70–99)

## 2021-06-07 IMAGING — CT CT IMAGE GUIDED FLUID DRAIN BY CATHETER
1 of 3 series · 14 of 32 positions shown, 19 images · non-contrast
Comparison: none

INDICATION: 80-year-old gentleman with right lower quadrant rim enhancing fluid
collection presents to IR for CT-guided abscess drain placement

[Series 2: i-spiral 5.0 b40f · axial · 0.64mm/px · z∈[-335,-65]mm · 14 of 87 slices shown, 19 images]
[im 5/87  soft-tissue]
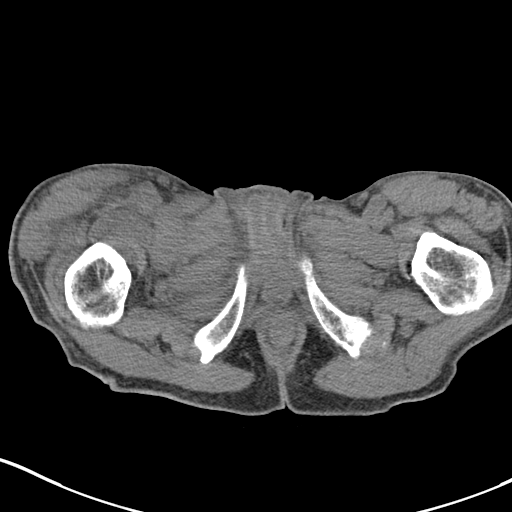
[im 5/87  bone]
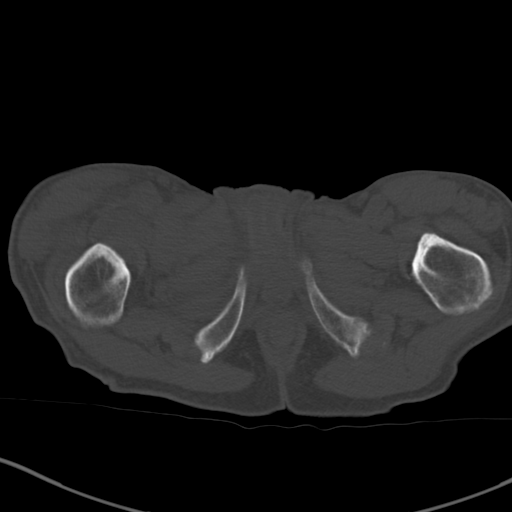
[im 14/87  soft-tissue]
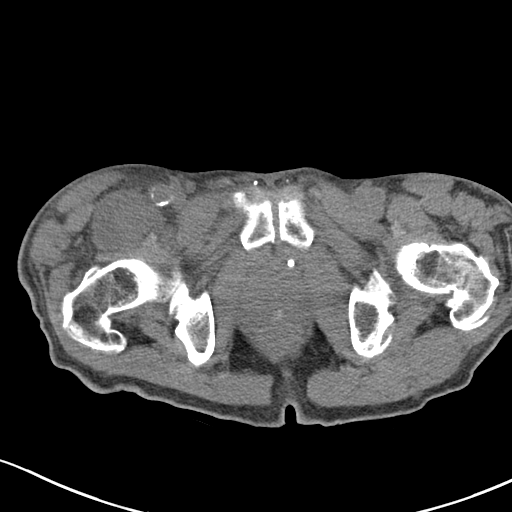
[im 19/87  soft-tissue]
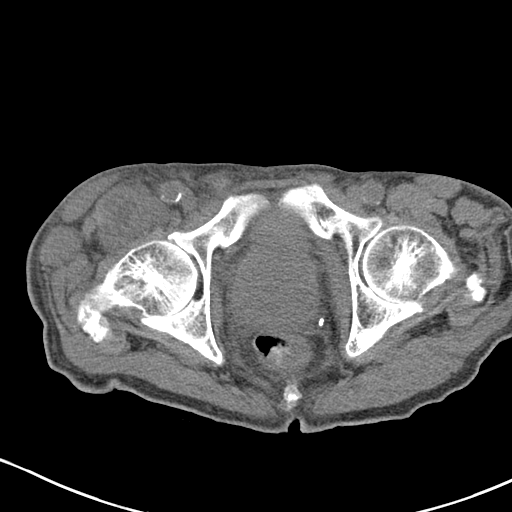
[im 23/87  soft-tissue]
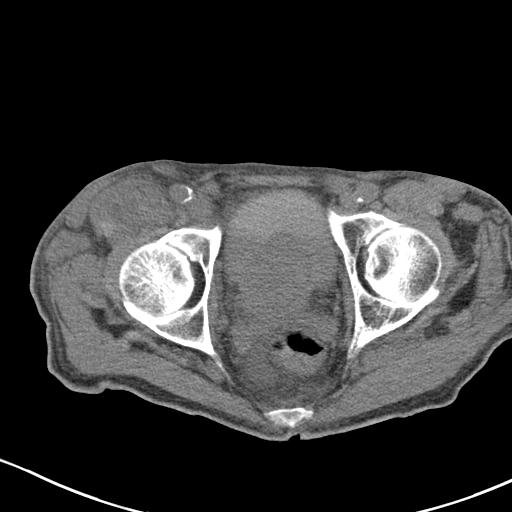
[im 32/87  soft-tissue]
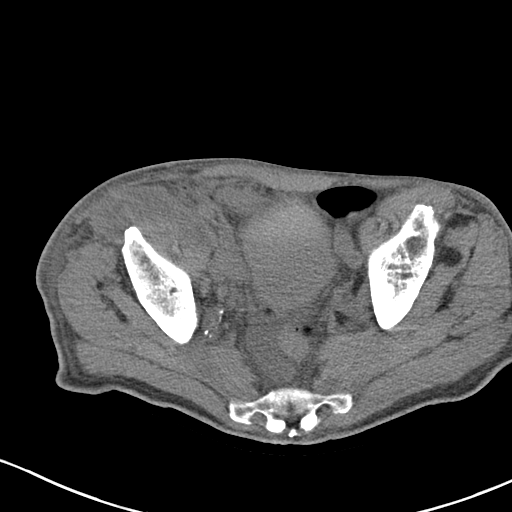
[im 37/87  soft-tissue]
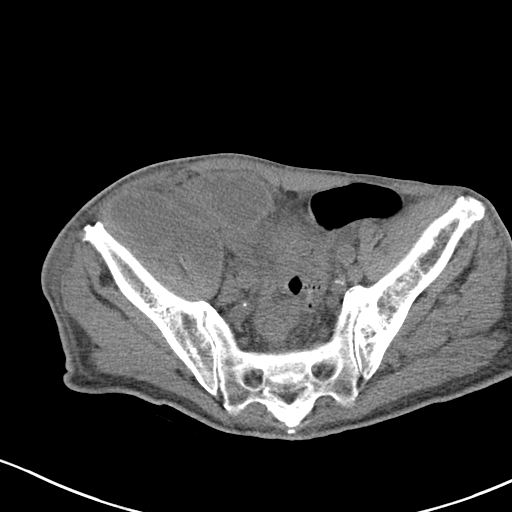
[im 46/87  soft-tissue]
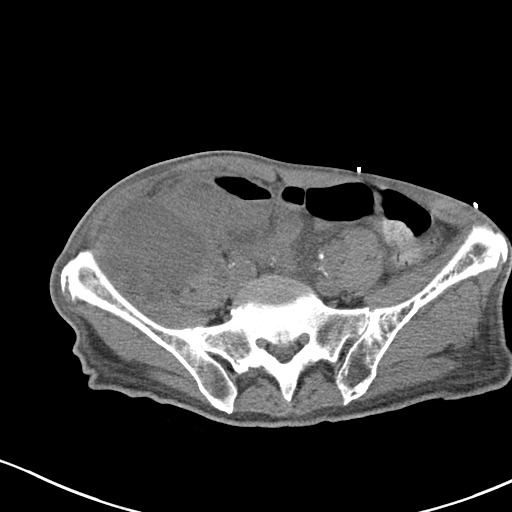
[im 50/87  soft-tissue]
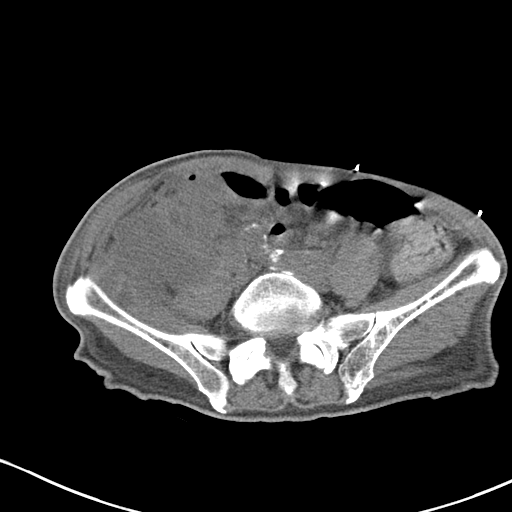
[im 55/87  soft-tissue]
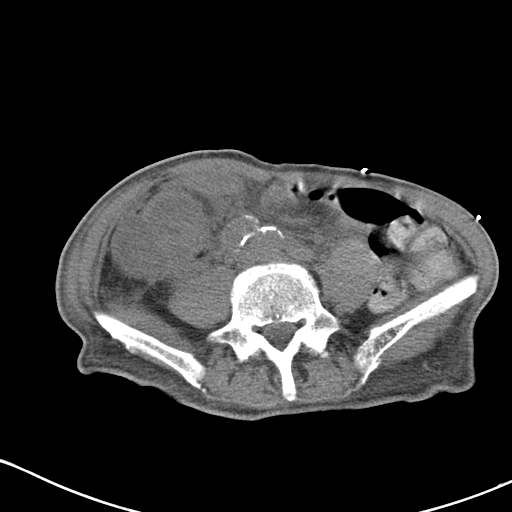
[im 55/87  bone]
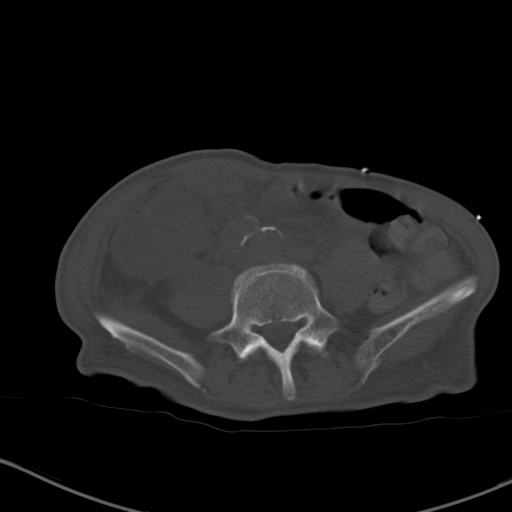
[im 64/87  soft-tissue]
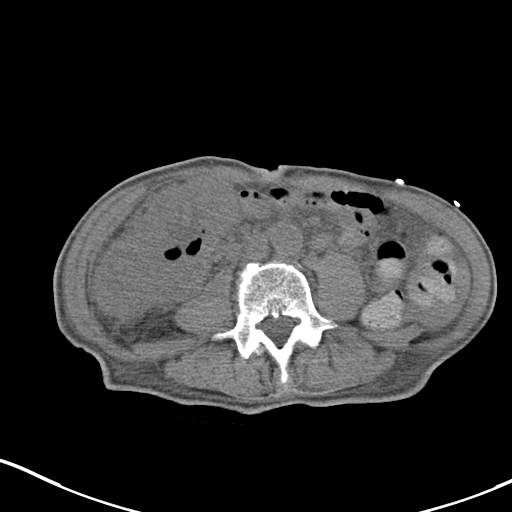
[im 68/87  soft-tissue]
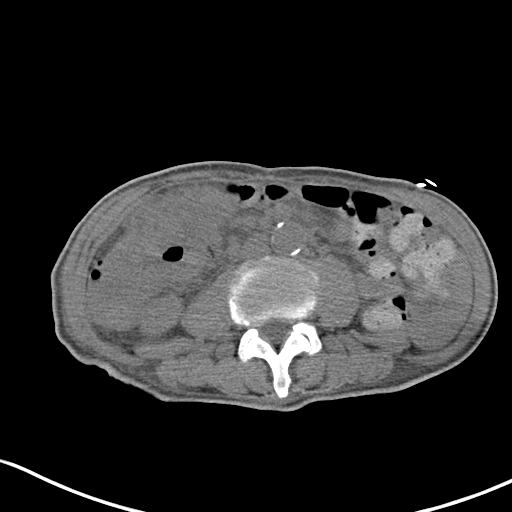
[im 68/87  lung]
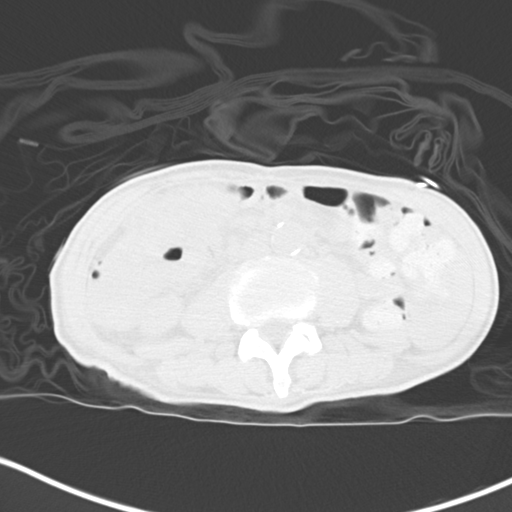
[im 73/87  soft-tissue]
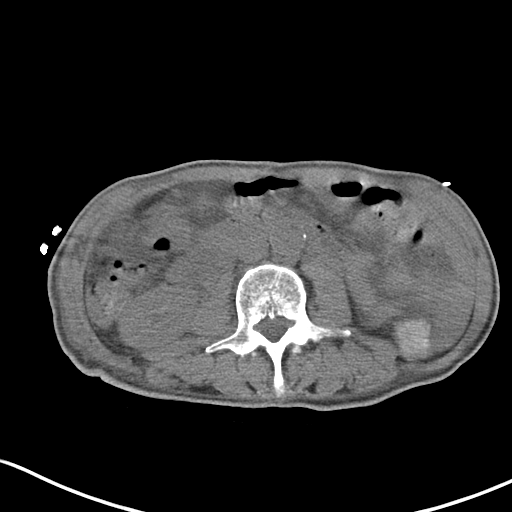
[im 73/87  lung]
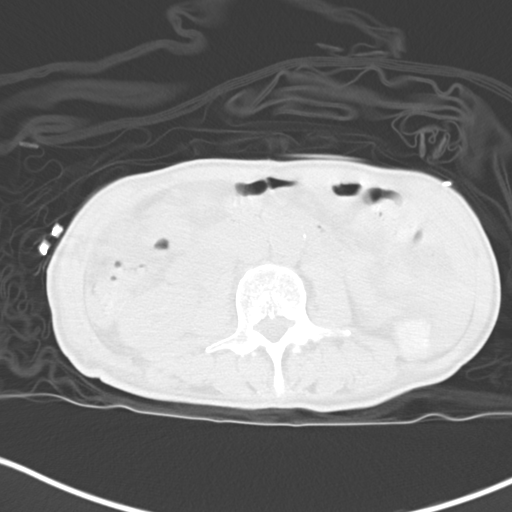
[im 77/87  lung]
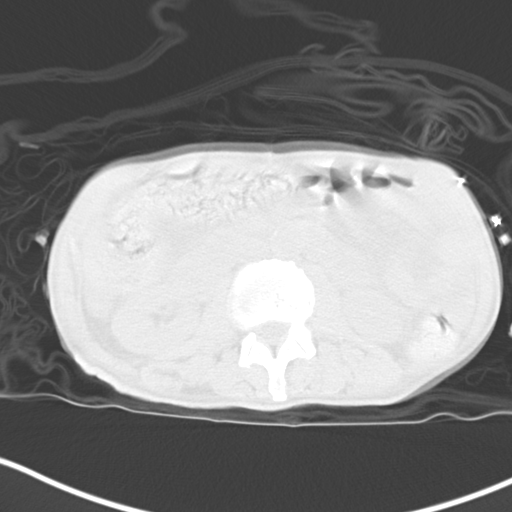
[im 82/87  soft-tissue]
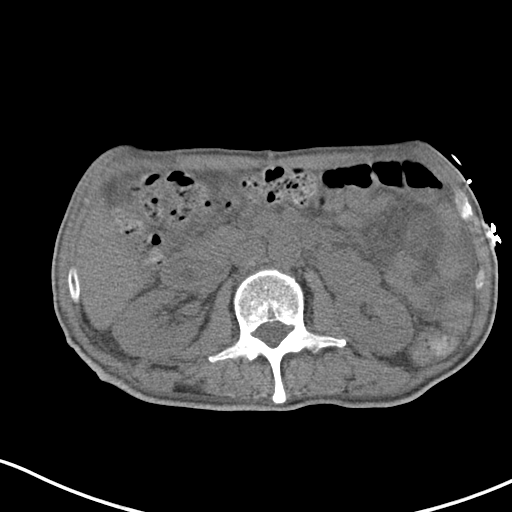
[im 82/87  lung]
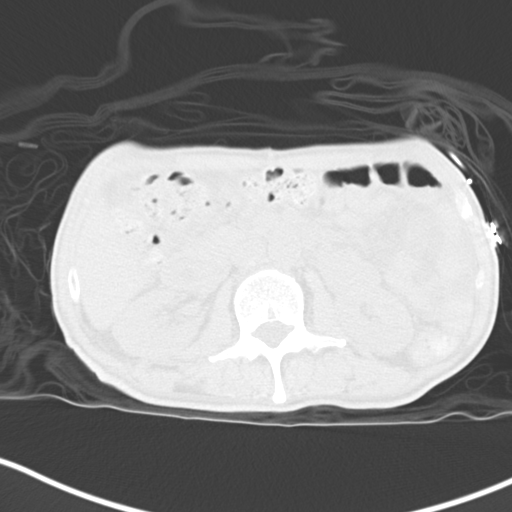

[14 of 32 positions shown; findings below may reference images not displayed]

EXAM:
CT-guided abscess drain placement

MEDICATIONS:
The patient is currently admitted to the hospital and receiving
intravenous antibiotics. The antibiotics were administered within an
appropriate time frame prior to the initiation of the procedure.

ANESTHESIA/SEDATION:
Fentanyl 75 mcg IV; Versed 1.5 mg IV

Moderate Sedation Time:  16 minutes

The patient was continuously monitored during the procedure by the
interventional radiology nurse under my direct supervision.

COMPLICATIONS:
None immediate.

PROCEDURE:
Informed written consent was obtained from the patient after a
thorough discussion of the procedural risks, benefits and
alternatives. All questions were addressed. Maximal Sterile Barrier
Technique was utilized including caps, mask, sterile gowns, sterile
gloves, sterile drape, hand hygiene and skin antiseptic. A timeout
was performed prior to the initiation of the procedure.

Patient positioned supine on the procedure table. The right lower
quadrant skin prepped and draped in usual fashion. Following local
lidocaine administration, 18 gauge needle was advanced into the
right lower quadrant abscess utilizing CT guidance. 18 gauge needle
exchanged for 10 French drain over 0.035 inch guidewire. Drain
secured to skin with suture and connected to bulb suction. 30 mL of
purulent material was aspirated.

Sample sent for Gram stain and culture.
IMPRESSION: CT guidance utilized to place 10 French multipurpose pigtail drain
in right lower quadrant abscess.

## 2021-06-07 IMAGING — DX DG CHEST 1V PORT
1 series · 2 of 2 positions shown · non-contrast
Comparison: [DATE]

CLINICAL DATA: Sepsis

EXAM:
PORTABLE CHEST 1 VIEW

[Series 1: chest ap · 0.14mm/px · 2 of 2 slices shown]
[im 1/2]
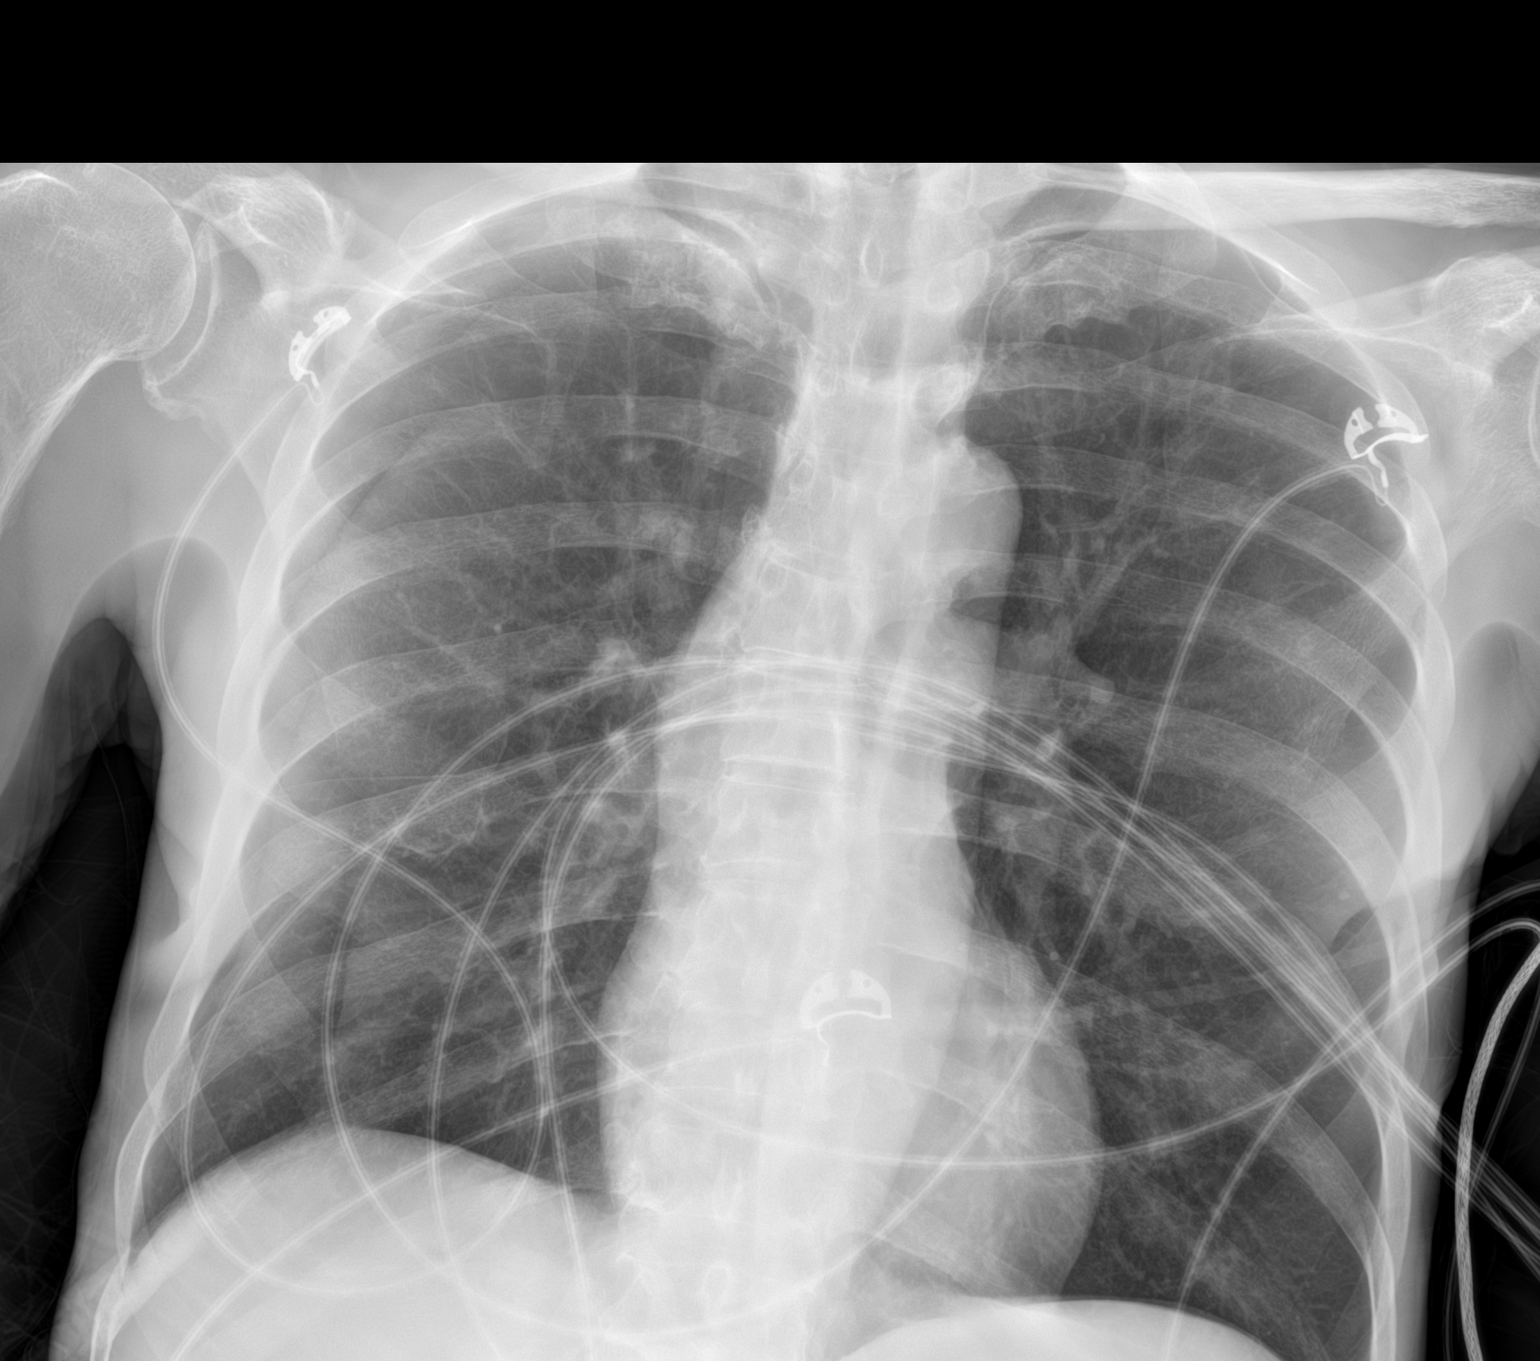
[im 2/2]
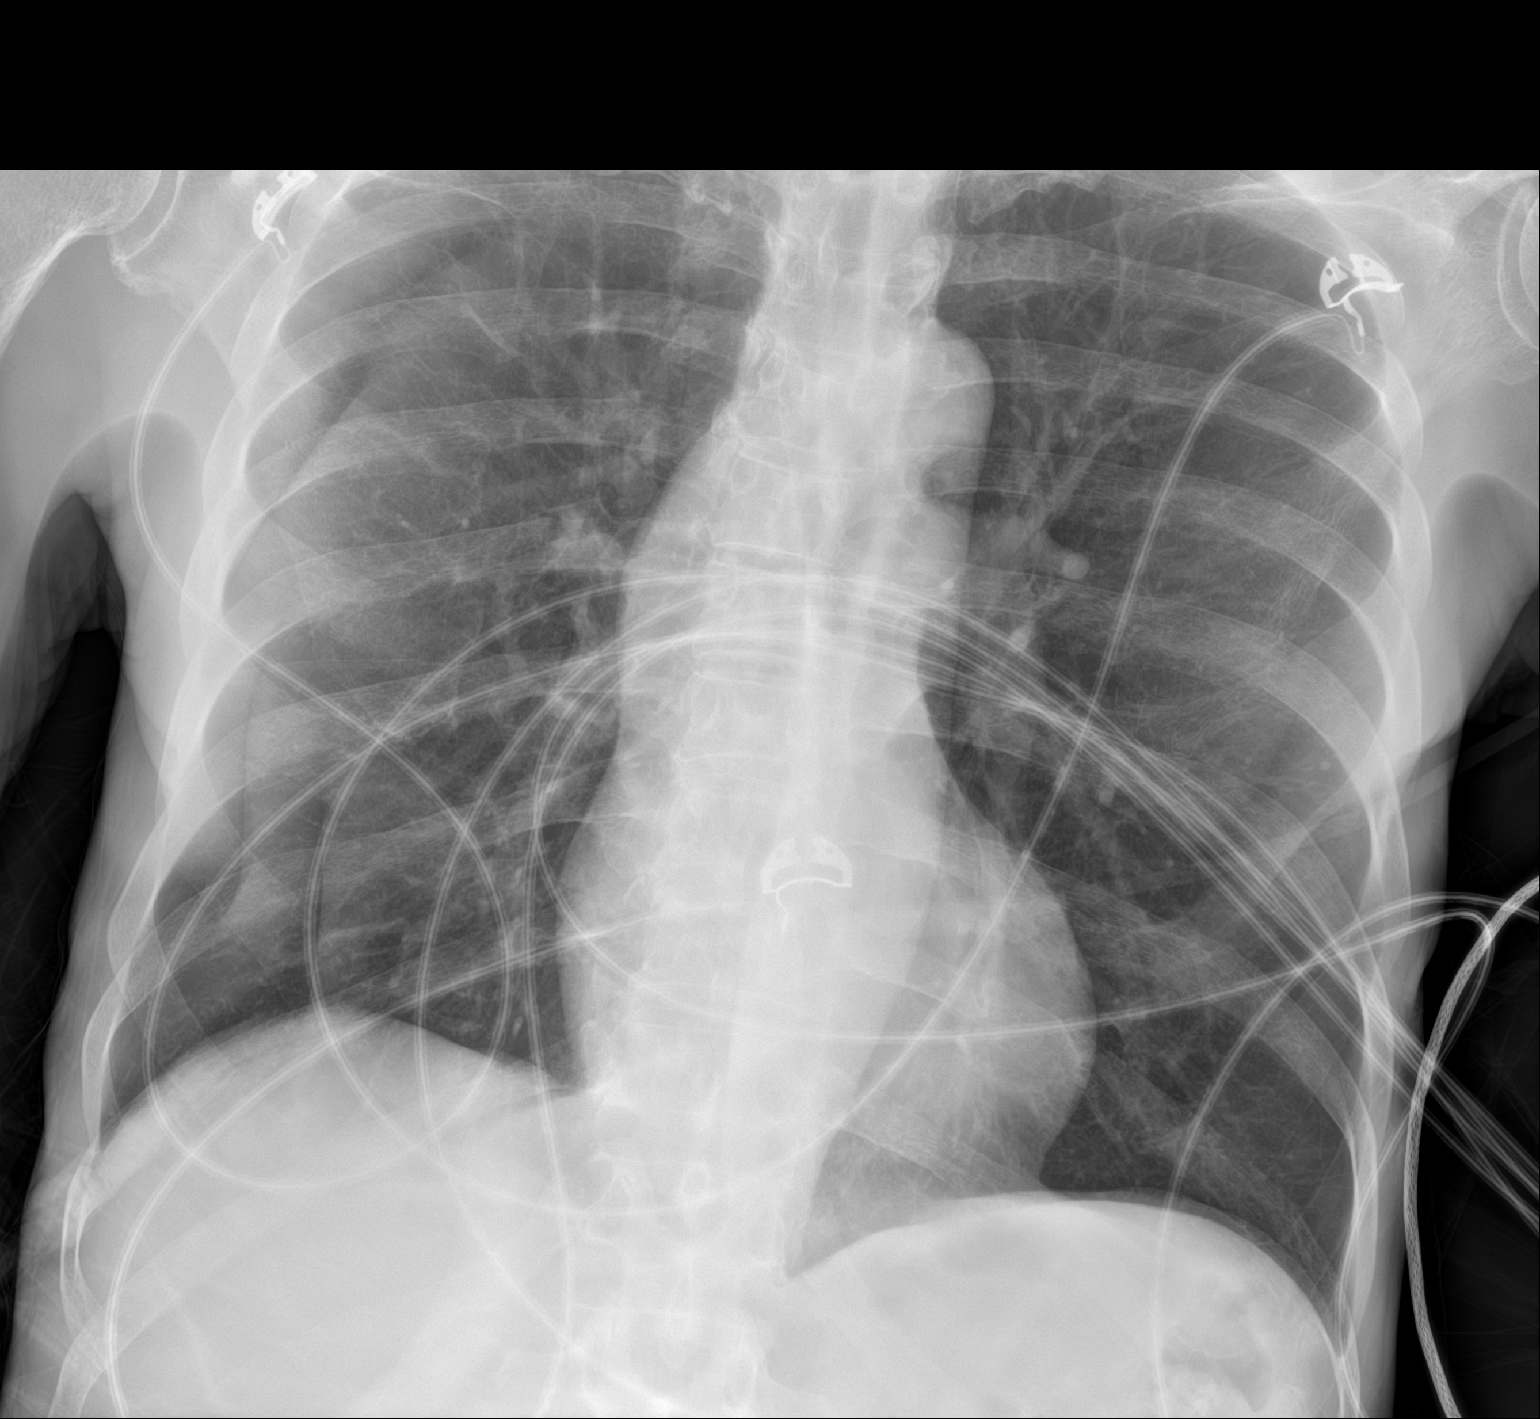

[2 of 2 positions shown; findings below may reference images not displayed]

FINDINGS: Cardiac and mediastinal contours are within normal limits. No focal
pulmonary opacity. No pleural effusion or pneumothorax. No acute
osseous abnormality.
IMPRESSION: No acute cardiopulmonary process.

## 2021-06-07 MED ORDER — MIDAZOLAM HCL 2 MG/2ML IJ SOLN
INTRAMUSCULAR | Status: AC | PRN
Start: 2021-06-07 — End: 2021-06-07
  Administered 2021-06-07: 1 mg via INTRAVENOUS
  Administered 2021-06-07: .5 mg via INTRAVENOUS

## 2021-06-07 MED ORDER — ENSURE ENLIVE PO LIQD
237.0000 mL | Freq: Three times a day (TID) | ORAL | Status: DC
  Administered 2021-06-07 – 2021-06-15 (×18): 237 mL via ORAL
  Filled 2021-06-07: qty 237

## 2021-06-07 MED ORDER — ACETAMINOPHEN 325 MG PO TABS
650.0000 mg | ORAL_TABLET | Freq: Four times a day (QID) | ORAL | Status: DC | PRN
  Administered 2021-06-07 – 2021-06-13 (×3): 650 mg via ORAL
  Filled 2021-06-07 (×3): qty 2

## 2021-06-07 MED ORDER — FENTANYL CITRATE (PF) 100 MCG/2ML IJ SOLN
INTRAMUSCULAR | Status: AC
  Filled 2021-06-07: qty 4

## 2021-06-07 MED ORDER — METOCLOPRAMIDE HCL 10 MG PO TABS
10.0000 mg | ORAL_TABLET | Freq: Once | ORAL | Status: AC
  Administered 2021-06-07: 10 mg via ORAL
  Filled 2021-06-07: qty 1

## 2021-06-07 MED ORDER — POTASSIUM CHLORIDE CRYS ER 20 MEQ PO TBCR
40.0000 meq | EXTENDED_RELEASE_TABLET | Freq: Once | ORAL | Status: AC
  Administered 2021-06-07: 40 meq via ORAL
  Filled 2021-06-07: qty 2

## 2021-06-07 MED ORDER — MIDAZOLAM HCL 2 MG/2ML IJ SOLN
INTRAMUSCULAR | Status: AC
  Filled 2021-06-07: qty 4

## 2021-06-07 MED ORDER — FENTANYL CITRATE (PF) 100 MCG/2ML IJ SOLN
INTRAMUSCULAR | Status: AC | PRN
  Administered 2021-06-07: 25 ug via INTRAVENOUS
  Administered 2021-06-07: 50 ug via INTRAVENOUS

## 2021-06-07 NOTE — Progress Notes (Signed)
FPTS Interim Progress Note  S: Paged for fever of 101.4, confirmed with rectal temp. On assessment patient is ill-appearing. He denies subjective fever or chills, denies pain. ABD assessment reveals RLQ tenderness, no rebound, no other quadrants TTP. Patient reports this tenderness is subacute in nature. Notable hiccups, which patient reports also have been going on for several weeks.   O: BP 134/65 (BP Location: Left Arm)   Pulse 93   Temp (!) 101.7 F (38.7 C) (Rectal)   Resp (!) 22   Ht 5\' 11"  (1.803 m)   Wt 136 lb (61.7 kg)   SpO2 94%   BMI 18.97 kg/m   Physical Exam Vitals reviewed.  Constitutional:      Appearance: He is ill-appearing.  Cardiovascular:     Rate and Rhythm: Normal rate and regular rhythm.     Heart sounds: No murmur heard. Pulmonary:     Effort: Pulmonary effort is normal.  Abdominal:     General: Bowel sounds are normal.     Comments: Midline surgical scar, TTP in the RLQ  Neurological:     Mental Status: He is alert.     A/P: Patient meets sepsis criteria with tachycardia, tachypnea, fever, and known source. No hypotension. Fever is new.  -Added Ucx and CXR to current infectious workup  -No change in ABX right now, continue Zosyn -Give 10 mg Reglan x1 for Hiccups -Remainder per day team plan   Corky Sox, MD PGY-1 [place] Intern pager: [phone], text pages welcome

## 2021-06-07 NOTE — Progress Notes (Signed)
I evaluated the patient at bedside s/p CT guided RLQ abscess drain placement for RLQ abscess that was tolerated well with minimal blood loss (EBL <10 mL). He was awake, alert and had no complaints of nausea or vomiting, and only has pain when moving or ambulating.  Drain is patent with around 287mL purulent white fluid. Dry dressing intact.  RD was also in the room evaluating the patient. He was provided an Ensure drink.  The patient said he was hungry and wanted to eat [place].  His nephew [name] was in the room and asked for an update on Albert Love's situation. I answered all questions to his satisfaction regarding diagnosis and current medical treatment. I explained that palliative care would come discuss goals of care given concern for malignancy and non-operative management. He requested that the conversation happens tomorrow so he can be there to help Albert Love understand everything given cultural barrier.  Orvis Brill, [place] Intern Pager: [phone] , text pages welcome

## 2021-06-07 NOTE — Evaluation (Signed)
Occupational Therapy Evaluation Patient Details Name: Albert Love MRN: [id number] DOB: January 26, 1941 Today's Date: 06/07/2021   History of Present Illness 80 y.o. male presenting to ED 10/22 with 3 week Hx of worsening bowel pain, weakness and fatigue. 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   Clinical Impression   PTA patient was living alone in 7th floor apartment and was grossly I with ADLs/IADLs without AD. Patient reports that he does not drive secondary to blindness in R eye and that he takes cab to the grocery store. Patient currently functioning below baseline demonstrating observed ADLs including grooming seated EOB and LB dressing to don footwear with Min to Max A. Patient also limited by deficits listed below including generalized weakness, pain in R hip at drain site and static/dynamic standing balance and would benefit from continued acute OT services in prep for safe d/c to next level of care.       Recommendations for follow up therapy are one component of a multi-disciplinary discharge planning process, led by the attending physician.  Recommendations may be updated based on patient status, additional functional criteria and insurance authorization.   Follow Up Recommendations  Acute inpatient rehab (3hours/day)    Assistance Recommended at Discharge    Functional Status Assessment  Patient has had a recent decline in their functional status and demonstrates the ability to make significant improvements in function in a reasonable and predictable amount of time.  Equipment Recommendations  BSC;Other (comment) (rolling walker)    Recommendations for Other Services Rehab consult     Precautions / Restrictions  Precautions Precautions: Fall Restrictions Weight Bearing Restrictions: No      Mobility Bed Mobility Overal bed mobility: Needs Assistance Bed Mobility: Sidelying to Sit;Sit to Sidelying;Rolling Rolling: Min assist Sidelying to sit: Min assist     Sit to sidelying: Min assist General bed mobility comments: Min A for all parts of bed mobility with increased time/effort secondary to pain in R hip.    Transfers Overall transfer level: Needs assistance Equipment used: Rolling walker (2 wheels) Transfers: Sit to/from Stand Sit to Stand: Min assist           General transfer comment: Heavy Min A for sit to stand from elevated EOB to RW with cues for hand placement.      Balance Overall balance assessment: Needs assistance Sitting-balance support: Bilateral upper extremity supported;Feet supported Sitting balance-Leahy Scale: Fair Sitting balance - Comments: Braces BUE on bed surface 2/2 pain in R hip.   Standing balance support: Bilateral upper extremity supported;Reliant on assistive device for balance;During functional activity Standing balance-Leahy Scale: Poor                             ADL either performed or assessed with clinical judgement   ADL Overall ADL's : Needs assistance/impaired Eating/Feeding: Minimal assistance;Sitting Eating/Feeding Details (indicate cue type and reason): Difficulty bringing ice chips to mouth with spoon. Bilateral UE tremors noted. Grooming: Dance movement psychotherapist;Wash/dry hands;Minimal assistance   Upper Body Bathing: Minimal assistance   Lower Body Bathing: Moderate assistance   Upper Body Dressing : Minimal assistance   Lower Body Dressing: Moderate assistance Lower Body Dressing Details (indicate cue type and reason): Max A to don footwear at bedlevel. Toilet Transfer: Minimal assistance;Rolling walker (  2 wheels) Toilet Transfer Details (indicate cue type and reason): Simualted with lateral scoots toward [place] with RW.                  Vision Baseline Vision/History: 1 Wears glasses (Readers) Ability to See in Adequate Light: 3 Highly impaired Patient Visual Report: Other (comment) (Blind in R eye at baseline) Vision Assessment?: Vision impaired- to be further tested in functional context Additional Comments: Visual deficits in R eye at baseline     Perception     Praxis      Pertinent Vitals/Pain Pain Assessment: 0-10 Pain Score: 9  Pain Location: R hip/leg with movement (at drain) Pain Descriptors / Indicators: Aching;Sore Pain Intervention(s): Limited activity within patient's tolerance;Monitored during session;Repositioned     Hand Dominance Right   Extremity/Trunk Assessment Upper Extremity Assessment Upper Extremity Assessment: Generalized weakness   Lower Extremity Assessment Lower Extremity Assessment: Defer to PT evaluation   Cervical / Trunk Assessment Cervical / Trunk Assessment: Kyphotic (Mild)   Communication Communication Communication: No difficulties   Cognition Arousal/Alertness: Awake/alert;Lethargic (Mild lethargy from procedure earlier this date.) Behavior During Therapy: WFL for tasks assessed/performed Overall Cognitive Status: Within Functional Limits for tasks assessed                                       General Comments  VSS on RA. Periods of poor pleth with SpO2 84%. No obvious s/s of respiratory distress; no SOB.    Exercises     Shoulder Instructions      Home Living Family/patient expects to be discharged to:: Other (Comment) Living Arrangements: Alone Available Help at Discharge: Family;Available PRN/intermittently (Nephew works during the day) Type of Home: Other(Comment) Home Access: Elevator (7th floor)     Home Layout: One level     Bathroom Shower/Tub: Corporate investment banker: Standard     Home Equipment: Grab bars - tub/shower;Grab bars - toilet          Prior Functioning/Environment Prior  Level of Function : Independent/Modified Independent             Mobility Comments: I without AD ADLs Comments: I with ADLs/IADLs; does not drive; takes a cab to the grocery store.        OT Problem List: Decreased strength;Decreased activity tolerance;Impaired balance (sitting and/or standing);Impaired vision/perception;Decreased coordination;Decreased safety awareness;Decreased knowledge of use of DME or AE;Pain      OT Treatment/Interventions: Self-care/ADL training;Therapeutic exercise;Energy conservation;DME and/or AE instruction;Therapeutic activities;Patient/family education;Balance training;Visual/perceptual remediation/compensation    OT Goals(Current goals can be found in the care plan section) Acute Rehab OT Goals Patient Stated Goal: To get stronger OT Goal Formulation: With patient Time For Goal Achievement: 06/21/21 Potential to Achieve Goals: Good ADL Goals Pt Will Perform Eating: Independently;sitting Pt Will Perform Grooming: with modified independence;standing Pt Will Perform Upper Body Dressing: with modified independence;standing Pt Will Perform Lower Body Dressing: with modified independence;sit to/from stand Pt Will Transfer to Toilet: with modified independence;regular height toilet;ambulating Pt Will Perform Toileting - Clothing Manipulation and hygiene: with modified independence;sit to/from stand Pt Will Perform Tub/Shower Transfer: Shower transfer;grab bars;rolling walker Additional ADL Goal #1: Patient will tolerate static sitting at EOB for 8-10 minutes in prep for ADLs reporting pain <5/10 at R hip.  OT Frequency: Min 2X/week   Barriers to D/C:            Co-evaluation  AM-PAC OT "6 Clicks" Daily Activity     Outcome Measure Help from another person eating meals?: A Little Help from another person taking care of personal grooming?: A Little Help from another person toileting, which includes using toliet, bedpan, or urinal?: A  Lot Help from another person bathing (including washing, rinsing, drying)?: A Lot Help from another person to put on and taking off regular upper body clothing?: A Little Help from another person to put on and taking off regular lower body clothing?: A Lot 6 Click Score: 15   End of Session Equipment Utilized During Treatment: Gait belt;Rolling walker (2 wheels) Nurse Communication: Mobility status  Activity Tolerance: Patient limited by lethargy Patient left: in bed;with call bell/phone within reach;with bed alarm set  OT Visit Diagnosis: Unsteadiness on feet (R26.81);Other abnormalities of gait and mobility (R26.89);Muscle weakness (generalized) (M62.81);Pain Pain - Right/Left: Right Pain - part of body: Hip;Leg                Time: 1610-9604 OT Time Calculation (min): 33 min Charges:  OT General Charges $OT Visit: 1 Visit OT Evaluation $OT Eval Moderate Complexity: 1 Mod OT Treatments $Self Care/Home Management : 8-22 mins  Corrinna Karapetyan H. OTR/L Supplemental OT, Department of rehab services [phone]  Starleen Trussell R H. 06/07/2021, 1:55 PM

## 2021-06-07 NOTE — Progress Notes (Signed)
Initial Nutrition Assessment  DOCUMENTATION CODES:   Severe malnutrition in context of chronic illness, Underweight  INTERVENTION:  - Ensure Enlive po TID, each supplement provides 350 kcal and 20 grams of protein (prefers vanilla/strawberry)  - Magic cup TID with meals, each supplement provides 290 kcal and 9 grams of protein (prefers vanilla)  - Continue MVI with minerals daily  NUTRITION DIAGNOSIS:   Severe Malnutrition related to chronic illness (colonic mass with questionable malignancy) as evidenced by percent weight loss, severe fat depletion, severe muscle depletion.  GOAL:   Patient will meet greater than or equal to 90% of their needs  MONITOR:   PO intake, Supplement acceptance, Labs, Weight trends  REASON FOR ASSESSMENT:   Consult Assessment of nutrition requirement/status (Cachexia, protein cal malnutrition)  ASSESSMENT:   Admitted with abdominal pain, weakness and fatigue x3 weeks. PMH includes HTN, microcytic anemia, osteopenia, HLD, inguinal hernia, perforated gastric ulcer and subsequent pneumoperitoneum s/p omental Graham patch 01/21.  Pt being assessed for colonic mass and concern for malignancy. Pt had RLQ abscess drain placement today.  Spoke with pt and his nephew at bedside today. He reports eating very minimally, having only had a fish sandwich and some tea, within the past 2-3 weeks d/t abdominal pain and extreme weakness. Pt reported feeling hungry during visit. MD also in room and agreed to advance to regular diet. Provided Ensure shake for pt. Pt wears dentures but has no trouble chewing.  He does not weigh himself regularly but has noticed significant weight loss within the past 6 months. Per review of chart, noted pt weight of 160 lbs on 11/01/20 and weight on 06/05/21 of 135 lbs. This is a 16% weight loss which is significant for time frame.      Noted plans from GI for possibility of TPN if pt unable to meet nutritional needs. Palliative  consulted to discuss [name].  Medications: ferrous sulfate, MVI with minerals, thiamine, zosyn  Labs: potassium 3.2, AST 45, hemoglobin 8.8  NUTRITION - FOCUSED PHYSICAL EXAM:  Flowsheet Row Most Recent Value  Orbital Region Severe depletion  Upper Arm Region Severe depletion  Thoracic and Lumbar Region Severe depletion  Buccal Region Severe depletion  Temple Region Severe depletion  Clavicle Bone Region Severe depletion  Clavicle and Acromion Bone Region Moderate depletion  Scapular Bone Region Severe depletion  Dorsal Hand Severe depletion  Patellar Region Severe depletion  Anterior Thigh Region Severe depletion  Posterior Calf Region Severe depletion  Edema (RD Assessment) None  Eyes Reviewed  Mouth Reviewed  Skin Reviewed  Nails Reviewed       Diet Order:   Diet Order             Diet regular Room service appropriate? Yes; Fluid consistency: Thin  Diet effective now                   EDUCATION NEEDS:   Education needs have been addressed  Skin:  Skin Assessment: Reviewed RN Assessment  Last BM:  unknown  Height:   Ht Readings from Last 1 Encounters:  06/05/21 5\' 11"  (1.803 m)    Weight:   Wt Readings from Last 1 Encounters:  06/05/21 61.7 kg    BMI:  Body mass index is 18.97 kg/m.  Estimated Nutritional Needs:   Kcal:  1900-2100  Protein:  95-105g  Fluid:  >1.9L  Albert Love, RDN, LDN Clinical Nutrition

## 2021-06-07 NOTE — Progress Notes (Signed)
Subjective/Chief Complaint: Still with pain on the right side.   Objective: Vital signs in last 24 hours: Temp:  [97.6 F (36.4 C)-101.7 F (38.7 C)] 97.9 F (36.6 C) (10/24 1138) Pulse Rate:  [76-110] 86 (10/24 1138) Resp:  [15-30] 19 (10/24 1138) BP: (109-170)/(64-95) 112/64 (10/24 1138) SpO2:  [91 %-100 %] 96 % (10/24 1138) Last BM Date:  (unsure, thinks 2-3 days)  Intake/Output from previous day: No intake/output data recorded. Intake/Output this shift: Total I/O In: -  Out: 30 [Drains:30]  Alert, calm and pleasant, cachectic Unlabored respirations Abdomen with a palpable firm mass along the right hemiabdomen and tenderness along right lower quadrant and right flank No lower extremity edema  Lab Results:  Recent Labs    06/06/21 1200 06/07/21 0042  WBC 27.2* 30.0*  HGB 9.4* 8.8*  HCT 29.8* 27.6*  PLT 456* 456*   BMET Recent Labs    06/06/21 1200 06/07/21 0042  NA 136 135  K 3.5 3.2*  CL 99 100  CO2 26 26  GLUCOSE 175* 117*  BUN 12 9  CREATININE 0.86 0.72  CALCIUM 8.4* 8.1*   PT/INR No results for input(s): LABPROT, INR in the last 72 hours. ABG No results for input(s): PHART, HCO3 in the last 72 hours.  Invalid input(s): PCO2, PO2  Studies/Results: CT ABDOMEN PELVIS W CONTRAST  Result Date: 06/06/2021 CLINICAL DATA:  Abdominal pain. EXAM: CT ABDOMEN AND PELVIS WITH CONTRAST TECHNIQUE: Multidetector CT imaging of the abdomen and pelvis was performed using the standard protocol following bolus administration of intravenous contrast. CONTRAST:  39mL OMNIPAQUE IOHEXOL 350 MG/ML SOLN COMPARISON:  CT abdomen pelvis dated 09/17/2019. FINDINGS: Lower chest: Right lung base linear atelectasis/scarring. The visualized lung bases are otherwise clear. Coronary vascular calcification. No intra-abdominal free air.  No significant free fluid. Hepatobiliary: A 1 cm left hepatic cyst. No intrahepatic biliary dilatation. The gallbladder is unremarkable.  Pancreas: Unremarkable. No pancreatic ductal dilatation or surrounding inflammatory changes. Spleen: Normal in size without focal abnormality. Adrenals/Urinary Tract: The adrenal glands are unremarkable. There is no hydronephrosis on either side. There is symmetric enhancement and excretion of contrast by both kidneys. The urinary bladder is grossly unremarkable. Stomach/Bowel: Evaluation of the bowel is limited due to paucity of intra-abdominal fat. There is no bowel obstruction. There is masslike thickening and irregularity of the cecum and proximal ascending colon most concerning for malignancy. Further evaluation with colonoscopy is recommended. The appendix is not identified. Vascular/Lymphatic: Mild aortoiliac atherosclerotic disease. The IVC is grossly unremarkable. No portal venous gas. There is no adenopathy. 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 The prostate gland measures 5.7 cm in transverse diameter. Other: Multilobulated low attenuating mass or collection appears somewhat contiguous with the cecum and proximal ascending colon and extends into the right iliacus and distal psoas muscle measuring approximately 9 x 9 cm in greatest axial dimensions and 13 cm in craniocaudal length. This is concerning for a necrotic malignancy although an infected collection or abscess is not excluded. Musculoskeletal: There is osteopenia. No acute osseous pathology. Severe cachexia. IMPRESSION: 1. Masslike thickening and irregularity of the cecum and proximal ascending colon most concerning for malignancy. Further evaluation with colonoscopy is recommended. 2. Multilobulated low attenuating mass or collection appears somewhat contiguous with the cecum and proximal ascending colon and extends into the right iliacus and distal psoas muscle. This is concerning for a necrotic malignancy although an infected collection or abscess is not excluded. 3. 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 4. Aortic Atherosclerosis (ICD10-I70.0). Electronically Signed   By: Anner Crete M.D.   On: 06/06/2021 03:33   DG CHEST PORT 1 VIEW  Result Date: 06/07/2021 CLINICAL DATA:  Sepsis EXAM: PORTABLE CHEST 1 VIEW COMPARISON:  09/17/2019 FINDINGS: Cardiac and mediastinal contours are within normal limits. No focal pulmonary opacity. No pleural effusion or pneumothorax. No acute osseous abnormality. IMPRESSION: No acute cardiopulmonary process. Electronically Signed   By: Merilyn Baba M.D.   On: 06/07/2021 01:40   DG Abdomen Acute W/Chest  Result Date: 06/06/2021 CLINICAL DATA:  Right lower quadrant abdominal pain. Fatigue. Weakness. History of perforated gastric ulcer. EXAM: DG ABDOMEN ACUTE WITH 1 VIEW CHEST COMPARISON:  Abdominopelvic CT and chest radiograph 09/17/2019 FINDINGS: Normal heart size. Unchanged mediastinal contours. No acute airspace disease. No pleural effusion or pneumothorax. No pulmonary edema. No free intra-abdominal air. No bowel dilatation to suggest obstruction. There is air scattered throughout nondilated small and large bowel. No visualized radiopaque calculi. The bones are under mineralized without acute osseous abnormality. IMPRESSION: 1. No bowel obstruction or free air. 2. No acute intrathoracic process. Electronically Signed   By: Keith Rake M.D.   On: 06/06/2021 00:39    Anti-infectives: Anti-infectives (From admission, onward)    Start     Dose/Rate Route Frequency Ordered Stop   06/06/21 1430  piperacillin-tazobactam (ZOSYN) IVPB 3.375 g        3.375 g 12.5 mL/hr over 240 Minutes Intravenous Every 8 hours 06/06/21 1333     06/06/21 0400  piperacillin-tazobactam (ZOSYN) IVPB 3.375 g        3.375 g 100 mL/hr over 30 Minutes Intravenous  Once 06/06/21 0357 06/06/21 0438       Assessment/Plan:  - CT 10/23 showing masslike thickening and irregularity of the cecum and proximal  ascending colon most concerning for malignancy, multilobulated low-attenuation mass or collection contiguous with cecum and proximal ascending colon extending into the right iliac [place] and distal psoas muscle with concern for necrosis or an infected collection or abscess -History of perforated gastric ulcer status post omental Graham patch 121/21 by Dr. Kae Heller, episode current IR drain placement x2 -Spoke with GI and they are not planning colonoscopy at this time.  Agree with concern for abscess -No urgent/emergent surgical intervention recommended at this time.  Recommend continued IV antibiotics.  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  This is not resectable and attempting to do so would be highly morbid-not clear how this would improve his quality or quantity of life.  He is not clearly obstructed and suspect that his progressive weight loss is more related to progressive malignancy.  Agree with plans for palliative care consultation at this point.  We will continue to follow   FEN: NPO with sips and chips for now, IVF ID: Continue Zosyn VTE: Okay for chemical prophylaxis from our standpoint    LOS: 0 days    Clovis Riley 06/07/2021

## 2021-06-07 NOTE — Progress Notes (Signed)
[facility]  06/07/2021 10:30 AM  Went by to see the patient but he is currently in CT getting his drain placed.  We will follow with him tomorrow.  Ellouise Newer, PA-C.

## 2021-06-07 NOTE — Consult Note (Signed)
Chief Complaint: Patient was seen in consultation today for RLQ abscess drain placement Chief Complaint  Patient presents with   Failure To Thrive   at the request of [facility]; Dr Arelia Sneddon   Supervising Physician: Mir, Sharen Heck  Patient Status: [facility] - In-pt  History of Present Illness: Albert Love is a 80 y.o. male   Known gastric ulcer surgery 09/05/19 Central retinal artery occlusion; HTN; inguinal hernia Presented 10/22 to  with abd pain Wt loss Sxs worsening x 1 mo Diarrhea x few days  CT yesterday:  IMPRESSION: 1. Masslike thickening and irregularity of the cecum and proximal ascending colon most concerning for malignancy. Further evaluation with colonoscopy is recommended. 2. Multilobulated low attenuating mass or collection appears somewhat contiguous with the cecum and proximal ascending colon and extends into the right iliacus and distal psoas muscle. This is concerning for a necrotic malignancy although an infected collection or abscess is not excluded. 3. 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 4. Aortic Atherosclerosis (ICD10-I70.0).   Request for drain placement Approved with Dr Dwaine Gale Scheduled today   Past Medical History:  Diagnosis Date   Acute renal insufficiency 09/05/2019   CRAO (central retinal artery occlusion)    Perforated gastric ulcer s/p omental [name] patch 09/05/2019 09/05/2019    Past Surgical History:  Procedure Laterality Date   BOWEL RESECTION N/A 09/05/2019   Procedure: SMALL BOWEL RESECTION;  Surgeon: Clovis Riley, MD;  Location: [facility];  Service: General;  Laterality: N/A;   IR SINUS/FIST TUBE CHK-NON GI  09/19/2019   IR SINUS/FIST TUBE CHK-NON GI  09/19/2019   LAPAROTOMY N/A 09/05/2019   Procedure: EXPLORATORY LAPAROTOMY WITH [facility] AND GASTRIC BIOPSY;  Surgeon: Clovis Riley, MD;  Location: [facility];  Service: General;  Laterality: N/A;    Allergies: Patient has  no known allergies.  Medications: Prior to Admission medications   Medication Sig Start Date End Date Taking? Authorizing Provider  aspirin 81 MG chewable tablet Chew 1 tablet (81 mg total) by mouth daily. 01/21/21  Yes Gladys Damme, MD  atorvastatin (LIPITOR) 80 MG tablet TAKE 1 TABLET (80 MG TOTAL) BY MOUTH DAILY. Patient taking differently: Take 80 mg by mouth daily. 01/21/21 01/21/22 Yes Gladys Damme, MD  ferrous sulfate 325 (65 FE) MG EC tablet Take 1 tablet (325 mg total) by mouth daily with breakfast. 05/12/21  Yes Gatha Mayer, MD     Family History  Problem Relation Age of Onset   Colon cancer Neg Hx    Pancreatic cancer Neg Hx    Esophageal cancer Neg Hx     Social History   Socioeconomic History   Marital status: Single    Spouse name: Not on file   Number of children: Not on file   Years of education: Not on file   Highest education level: Not on file  Occupational History   Not on file  Tobacco Use   Smoking status: Every Day    Packs/day: 0.50    Types: Cigarettes   Smokeless tobacco: Never  Substance and Sexual Activity   Alcohol use: Yes    Comment: occasionally    Drug use: Not on file   Sexual activity: Not on file  Other Topics Concern   Not on file  Social History Narrative   08/2019 moved to 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  All other systems are negative.  Review of Systems  Constitutional:  Positive for activity change, appetite change and unexpected weight change. Negative for fatigue.   Respiratory:  Negative for cough and shortness of breath.   Cardiovascular:  Negative for chest pain.  Gastrointestinal:  Positive for abdominal pain, diarrhea and nausea.  Neurological:  Positive for weakness.  Psychiatric/Behavioral:  Negative for behavioral problems and confusion.    Vital Signs: BP 109/67 (BP Location: Left Arm)   Pulse 76   Temp 97.9 F (36.6 C) (Axillary)   Resp 20   Ht 5\' 11"  (1.803 m)   Wt 136 lb (61.7 kg)   SpO2 91%   BMI 18.97 kg/m   Physical Exam Vitals reviewed.  HENT:     Mouth/Throat:     Mouth: Mucous membranes are moist.  Cardiovascular:     Rate and Rhythm: Normal rate and regular rhythm.     Heart sounds: Normal heart sounds.  Pulmonary:     Effort: Pulmonary effort is normal.     Breath sounds: Normal breath sounds.  Abdominal:     General: There is no distension.     Palpations: Abdomen is soft.     Tenderness: There is abdominal tenderness.  Musculoskeletal:        General: Normal range of motion.  Skin:    General: Skin is warm.  Neurological:     Mental Status: He is alert and oriented to person, place, and time.  Psychiatric:        Behavior: Behavior normal.    Imaging: CT ABDOMEN PELVIS W CONTRAST  Result Date: 06/06/2021 CLINICAL DATA:  Abdominal pain. EXAM: CT ABDOMEN AND PELVIS WITH CONTRAST TECHNIQUE: Multidetector CT imaging of the abdomen and pelvis was performed using the standard protocol following bolus administration of intravenous contrast. CONTRAST:  50mL OMNIPAQUE IOHEXOL 350 MG/ML SOLN COMPARISON:  CT abdomen pelvis dated 09/17/2019. FINDINGS: Lower chest: Right lung base linear atelectasis/scarring. The visualized lung bases are otherwise clear. Coronary vascular calcification. No intra-abdominal free air.  No significant free fluid. Hepatobiliary: A 1 cm left hepatic cyst. No intrahepatic biliary dilatation. The gallbladder is unremarkable. Pancreas: Unremarkable. No pancreatic ductal dilatation or surrounding  inflammatory changes. Spleen: Normal in size without focal abnormality. Adrenals/Urinary Tract: The adrenal glands are unremarkable. There is no hydronephrosis on either side. There is symmetric enhancement and excretion of contrast by both kidneys. The urinary bladder is grossly unremarkable. Stomach/Bowel: Evaluation of the bowel is limited due to paucity of intra-abdominal fat. There is no bowel obstruction. There is masslike thickening and irregularity of the cecum and proximal ascending colon most concerning for malignancy. Further evaluation with colonoscopy is recommended. The appendix is not identified. Vascular/Lymphatic: Mild aortoiliac atherosclerotic disease. The IVC is grossly unremarkable. No portal venous gas. There is no adenopathy. 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 The prostate gland measures 5.7 cm in transverse diameter. Other: Multilobulated low attenuating mass or collection appears somewhat contiguous with the cecum and proximal ascending colon and extends into the right  iliacus and distal psoas muscle measuring approximately 9 x 9 cm in greatest axial dimensions and 13 cm in craniocaudal length. This is concerning for a necrotic malignancy although an infected collection or abscess is not excluded. Musculoskeletal: There is osteopenia. No acute osseous pathology. Severe cachexia. IMPRESSION: 1. Masslike thickening and irregularity of the cecum and proximal ascending colon most concerning for malignancy. Further evaluation with colonoscopy is recommended. 2. Multilobulated low attenuating mass or collection appears somewhat contiguous with the cecum and proximal ascending colon and extends into the right iliacus and distal psoas muscle. This is concerning for a necrotic malignancy although an infected collection or abscess is not excluded. 3. 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 4. Aortic Atherosclerosis (ICD10-I70.0). Electronically Signed   By: Anner Crete M.D.   On: 06/06/2021 03:33   DG CHEST PORT 1 VIEW  Result Date: 06/07/2021 CLINICAL DATA:  Sepsis EXAM: PORTABLE CHEST 1 VIEW COMPARISON:  09/17/2019 FINDINGS: Cardiac and mediastinal contours are within normal limits. No focal pulmonary opacity. No pleural effusion or pneumothorax. No acute osseous abnormality. IMPRESSION: No acute cardiopulmonary process. Electronically Signed   By: Merilyn Baba M.D.   On: 06/07/2021 01:40   DG Abdomen Acute W/Chest  Result Date: 06/06/2021 CLINICAL DATA:  Right lower quadrant abdominal pain. Fatigue. Weakness. History of perforated gastric ulcer. EXAM: DG ABDOMEN ACUTE WITH 1 VIEW CHEST COMPARISON:  Abdominopelvic CT and chest radiograph 09/17/2019 FINDINGS: Normal heart size. Unchanged mediastinal contours. No acute airspace disease. No pleural effusion or pneumothorax. No pulmonary edema. No free intra-abdominal air. No bowel dilatation to suggest obstruction. There is air scattered throughout nondilated small and large bowel. No visualized radiopaque calculi. The bones are under mineralized without acute osseous abnormality. IMPRESSION: 1. No bowel obstruction or free air. 2. No acute intrathoracic process. Electronically Signed   By: Keith Rake M.D.   On: 06/06/2021 00:39    Labs:  CBC: Recent Labs    05/12/21 1418 06/05/21 2328 06/06/21 1200 06/07/21 0042  WBC 9.5 30.5* 27.2* 30.0*  HGB 10.3* 10.1* 9.4* 8.8*  HCT 33.5* 32.5* 29.8* 27.6*  PLT 335.0 577* 456* 456*    COAGS: Recent Labs    10/19/20 1350  INR 1.1  APTT 38*    BMP: Recent Labs    10/20/20 0444 06/05/21 2328 06/06/21 1200 06/07/21 0042  NA 137 133* 136 135  K 4.3 3.6 3.5 3.2*  CL 105 96* 99 100  CO2 23 25 26 26   GLUCOSE 84 163* 175* 117*  BUN 9 14 12 9   CALCIUM 8.9 8.8* 8.4* 8.1*  CREATININE 0.92 0.97 0.86 0.72  GFRNONAA >60 >60 >60 >60    LIVER FUNCTION  TESTS: Recent Labs    10/19/20 1350 06/05/21 2328 06/06/21 1200 06/07/21 0042  BILITOT 0.6 0.8 0.8 0.7  AST 13* 40 44* 45*  ALT 8 31 28 28   ALKPHOS 62 216* 95 106  PROT 6.2* 6.9 6.0* 5.7*  ALBUMIN 3.1* 2.2* 1.9* 1.8*    TUMOR MARKERS: No results for input(s): AFPTM, CEA, CA199, CHROMGRNA in the last 8760 hours.  Assessment and Plan:  RLQ pain x 1 mo--- worsening 30 or more lb wt loss in last yr CT revealing RLQ abscess vs necrotic malignancy Scheduled now for abscess drain placement Risks and benefits discussed with the patient including bleeding, infection, damage to adjacent structures, bowel perforation/fistula connection, and sepsis.  All of the patient's questions were answered, patient is agreeable to proceed.  Consent signed and in chart.    Thank you for this interesting consult.  I greatly enjoyed meeting [facility] and look forward to participating in their care.  A copy of this report was sent to the requesting provider on this date.  Electronically Signed: Lavonia Drafts, PA-C 06/07/2021, 9:10 AM   I spent a total of 20 Minutes    in face to face in clinical consultation, greater than 50% of which was counseling/coordinating care for RLQ abscess drain

## 2021-06-07 NOTE — Progress Notes (Signed)
PT Cancellation Note  Patient Details Name: Albert Love MRN: [id number] DOB: [birth year]   Cancelled Treatment:    Reason Eval/Treat Not Completed: Patient at procedure or test/unavailable. Pt off the floor at IR for drain placement. PT to return as able, as appropriate to complete PT eval.  Kittie Plater, PT, DPT Acute Rehabilitation Services Pager #: [phone] Office #: [phone]    Berline Lopes 06/07/2021, 9:24 AM

## 2021-06-07 NOTE — Procedures (Signed)
Interventional Radiology Procedure Note  Procedure: CT guided RLQ abscess drain placement (10 fr)  Indication: RLQ abscess  Findings: Please refer to procedural dictation for full description.  Complications: None  EBL: < 10 mL  Miachel Roux, MD [phone]

## 2021-06-07 NOTE — Progress Notes (Addendum)
Family Medicine Teaching Service Daily Progress Note Intern Pager: [phone]  Patient name: Albert Love: [id number] Date of birth: [birth year] Age: 80 y.o. Gender: male  Primary Care Provider: Gladys Damme, MD Consultants: General surgery, Gastroenterology Code Status: Full  Pt Overview and Major Events to Date:  10/23- Admitted  Assessment and Plan: Albert Love is a 80 year old male who presented with 3 weeks of worsening bowel pain, weakness and fatigue found to have a masslike thickening and irregularity of cecum and proximal ascending colon on CT abdomen with concern for abscess, receiving IV antibiotics. PMH significant for peptic ulcer s/p [name] patch, H. Pylori, CRAO, prior intra-abdominal abscesses with drains.   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 WBC 30.0 this AM. Multilobulated low attenuating mass or collection appears somewhat contiguous with cecum and proxicmal ascending colon concerning for necrotic malignancy althoug infected collection/abscess not excluded. GI recommends no colonoscopy here due to possible abscess and leukocytosis, will need one outpatient. General surgery evaluated the patient and do not recommend urgent/emergent surgical intervention at this time, they suspect this is malignant and is not resectable. They recommended palliative care consultation, which was placed. I will discuss with palliative care and patient regarding goals of care (TPN recommended per GI, but need to discuss this with patient and palliative.) Continue Zosyn, day #2. IR drainage today.  - GI following, appreciate their care - Gen surg following, appreciate their care - Scheduled for abscess drain placement with IR today - Continue IV Zosyn (10/24- ) - PT/OT - AM CBC, CMP - F/u urine cultures - Resume diet s/p  drainage placement - F/u palliative care, goals of care conversation - Monitor VS  Chronic microcytic iron deficiency anemia Hgb 10.1 on admission (stable from 1 month ago), ferritin 14. GI thinks IDA possibly related to undiagnosed colon neoplasm. Hgb 8.8 today. No blood per rectum or dark stools this morning. - Continue PO iron supplementation s/p colonoscopy/ acute infection resolves - Monitor with daily CBC  Thrombocytosis Likely reactive d/t anemia and infection. 456 today. - Monitor with daily labs  Cachexia; hypoalbuminemia with protein calorie malnutrition Albumin 2.2 on admission. K+ 3.2 this morning, will replete with 40 mEq. Mg 1.9. - RD consultation - Daily MV - Ensure twice daily - Monitor for refeeding- daily CMP and Mg checks  Hypertension BP 100-120s/60s-70s this morning. Was on Amlodipine, but patient unsure if he takes it. Given normotension, will manage without antihypertensives for now. - Monitor VS  History of CRAO Continue ASA, monitor for bleeding.  Other conditions (HLD, osteopenia,enlarged prostate) chronic and stable. Home medications resumed  FEN/GI: NPO until IR drain, will resume diet if GI agrees PPx: SCDs Dispo:Pending PT recommendations  pending clinical improvement . Barriers include RLQ abscess drain placed,  Subjective:  [name] feels weak and has right lower quadrant pain, but denies pain elsewhere. He has no concerns. He talked with me about moving to the [place] from [place].   Objective: Temp:  [97.6 F (36.4 C)-101.7 F (38.7 C)] 97.9 F (36.6 C) (10/24 0728) Pulse Rate:  [76-110] 76 (10/24 0728) Resp:  [17-30] 20 (10/24 0728) BP: (109-170)/(65-95) 109/67 (10/24 0728) SpO2:  [91 %-100 %] 91 % (10/24 0728) Physical Exam: General: Laying in bed with sheet over his head, awakens for my exam, ill-appearing ENTM: Severe temporal wasting Cardiovascular: Regular rhythm, heart sounds soft Respiratory: Clear in all  fields Abdomen: Thin, normoactive bowel sounds, midline vertical surgical scar, tender in RLQ with palpable firm mass, no tenderness in other quadrants Extremities: Cachetic, thin, warm, dry, well-perfused  Laboratory: Recent Labs  Lab 06/05/21 2328 06/06/21 1200 06/07/21 0042  WBC 30.5* 27.2* 30.0*  HGB 10.1* 9.4* 8.8*  HCT 32.5* 29.8* 27.6*  PLT 577* 456* 456*   Recent Labs  Lab 06/05/21 2328 06/06/21 1200 06/07/21 0042  NA 133* 136 135  K 3.6 3.5 3.2*  CL 96* 99 100  CO2 _0 BUN _1 CREATININE 0.97 0.86 0.72  CALCIUM 8.8* 8.4* 8.1*  PROT 6.9 6.0* 5.7*  BILITOT 0.8 0.8 0.7  ALKPHOS 216* 95 106  ALT _2 AST 40 44* 45*  GLUCOSE 163* 175* 117*     Imaging/Diagnostic Tests: DG CHEST PORT 1 VIEW  Result Date: 06/07/2021 CLINICAL DATA:  Sepsis EXAM: PORTABLE CHEST 1 VIEW COMPARISON:  09/17/2019 FINDINGS: Cardiac and mediastinal contours are within normal limits. No focal pulmonary opacity. No pleural effusion or pneumothorax. No acute osseous abnormality. IMPRESSION: No acute cardiopulmonary process. Electronically Signed   By: Merilyn Baba M.D.   On: 06/07/2021 01:40     Orvis Brill, DO 06/07/2021, 9:40 AM PGY-1, [place] Intern pager: [phone], text pages welcome

## 2021-06-07 NOTE — Progress Notes (Signed)
   06/07/21 0036  Assess: MEWS Score  Temp (!) 101.4 F (38.6 C)  BP 134/65  Pulse Rate 93  ECG Heart Rate 92  Resp (!) 22  Level of Consciousness Alert  SpO2 94 %  O2 Device Room Air  Assess: MEWS Score  MEWS Temp 1  MEWS Systolic 0  MEWS Pulse 0  MEWS RR 1  MEWS LOC 0  MEWS Score 2  MEWS Score Color Yellow  Assess: if the MEWS score is Yellow or Red  Were vital signs taken at a resting state? Yes  Focused Assessment No change from prior assessment  Early Detection of Sepsis Score *See Row Information* High  MEWS guidelines implemented *See Row Information* Yes  Treat  MEWS Interventions Other (Comment) (MD entered orders)  Pain Scale 0-10  Pain Score 7  Pain Location Abdomen  Pain Orientation Right;Lower  Pain Descriptors / Indicators Aching  Pain Frequency Occasional  Pain Onset On-going  Pain Intervention(s) MD notified (Comment) (at bedside)  Multiple Pain Sites Yes  2nd Pain Site  Pain Location Leg  Pain Orientation Right  Take Vital Signs  Increase Vital Sign Frequency  Yellow: Q 2hr X 2 then Q 4hr X 2, if remains yellow, continue Q 4hrs  Escalate  MEWS: Escalate Yellow: discuss with charge nurse/RN and consider discussing with provider and RRT  Notify: Charge Nurse/RN  Name of Charge Nurse/RN Notified Nikki, RN  Date Charge Nurse/RN Notified 06/07/21  Time Charge Nurse/RN Notified 0040  Notify: Provider  Provider Name/Title Dr. Alvie Heidelberg  Date Provider Notified 06/07/21  Time Provider Notified 0100  Notification Type Page  Notification Reason Other (Comment) (nothing ordered PRN for temp)  Provider response See new orders (at bedside)  Date of Provider Response 06/07/21  Time of Provider Response 0105  Document  Patient Outcome Other (Comment) (will monitor)  Progress note created (see row info) Yes (febrile, vitals and pt stable. MD at bedside and orders entered.)

## 2021-06-08 DIAGNOSIS — Z7189 Other specified counseling: Secondary | ICD-10-CM

## 2021-06-08 DIAGNOSIS — Z66 Do not resuscitate: Secondary | ICD-10-CM

## 2021-06-08 DIAGNOSIS — K6389 Other specified diseases of intestine: Secondary | ICD-10-CM | POA: Diagnosis not present

## 2021-06-08 DIAGNOSIS — E43 Unspecified severe protein-calorie malnutrition: Secondary | ICD-10-CM | POA: Diagnosis not present

## 2021-06-08 DIAGNOSIS — Z515 Encounter for palliative care: Secondary | ICD-10-CM

## 2021-06-08 DIAGNOSIS — L0291 Cutaneous abscess, unspecified: Secondary | ICD-10-CM | POA: Diagnosis not present

## 2021-06-08 LAB — IRON AND TIBC
Iron: 19 ug/dL — ABNORMAL LOW (ref 45–182)
Saturation Ratios: 12 % — ABNORMAL LOW (ref 17.9–39.5)
TIBC: 160 ug/dL — ABNORMAL LOW (ref 250–450)
UIBC: 141 ug/dL

## 2021-06-08 LAB — COMPREHENSIVE METABOLIC PANEL
ALT: 42 U/L (ref 0–44)
AST: 78 U/L — ABNORMAL HIGH (ref 15–41)
Albumin: 1.6 g/dL — ABNORMAL LOW (ref 3.5–5.0)
Alkaline Phosphatase: 89 U/L (ref 38–126)
Anion gap: 8 (ref 5–15)
BUN: 12 mg/dL (ref 8–23)
CO2: 27 mmol/L (ref 22–32)
Calcium: 8 mg/dL — ABNORMAL LOW (ref 8.9–10.3)
Chloride: 101 mmol/L (ref 98–111)
Creatinine, Ser: 0.7 mg/dL (ref 0.61–1.24)
GFR, Estimated: >60 mL/min (ref >60)
Glucose, Bld: 119 mg/dL — ABNORMAL HIGH (ref 70–99)
Potassium: 3.6 mmol/L (ref 3.5–5.1)
Sodium: 136 mmol/L (ref 135–145)
Total Bilirubin: 0.4 mg/dL (ref 0.3–1.2)
Total Protein: 5.4 g/dL — ABNORMAL LOW (ref 6.5–8.1)

## 2021-06-08 LAB — CBC WITH DIFFERENTIAL/PLATELET
Abs Immature Granulocytes: 0 10*3/uL (ref 0.00–0.07)
Basophils Absolute: 0 10*3/uL (ref 0.0–0.1)
Basophils Relative: 0 %
Eosinophils Absolute: 0.2 10*3/uL (ref 0.0–0.5)
Eosinophils Relative: 1 %
HCT: 26.1 % — ABNORMAL LOW (ref 39.0–52.0)
Hemoglobin: 8.2 g/dL — ABNORMAL LOW (ref 13.0–17.0)
Lymphocytes Relative: 10 %
Lymphs Abs: 2.3 10*3/uL (ref 0.7–4.0)
MCH: 20.7 pg — ABNORMAL LOW (ref 26.0–34.0)
MCHC: 31.4 g/dL (ref 30.0–36.0)
MCV: 65.9 fL — ABNORMAL LOW (ref 80.0–100.0)
Monocytes Absolute: 0.9 10*3/uL (ref 0.1–1.0)
Monocytes Relative: 4 %
Neutro Abs: 19.1 10*3/uL — ABNORMAL HIGH (ref 1.7–7.7)
Neutrophils Relative %: 85 %
Platelets: 428 10*3/uL — ABNORMAL HIGH (ref 150–400)
RBC: 3.96 MIL/uL — ABNORMAL LOW (ref 4.22–5.81)
RDW: 15.6 % — ABNORMAL HIGH (ref 11.5–15.5)
WBC: 22.5 10*3/uL — ABNORMAL HIGH (ref 4.0–10.5)
nRBC: 0 % (ref 0.0–0.2)
nRBC: 0 /100 WBC

## 2021-06-08 LAB — GLUCOSE, CAPILLARY
Glucose-Capillary: 101 mg/dL — ABNORMAL HIGH (ref 70–99)
Glucose-Capillary: 125 mg/dL — ABNORMAL HIGH (ref 70–99)

## 2021-06-08 LAB — MAGNESIUM: Magnesium: 2 mg/dL (ref 1.7–2.4)

## 2021-06-08 LAB — FERRITIN: Ferritin: 279 ng/mL (ref 24–336)

## 2021-06-08 LAB — URINE CULTURE: Culture: NO GROWTH

## 2021-06-08 LAB — CEA: CEA: 4.8 ng/mL — ABNORMAL HIGH (ref 0.0–4.7)

## 2021-06-08 MED ORDER — METOCLOPRAMIDE HCL 10 MG PO TABS: 10.0000 mg | ORAL_TABLET | Freq: Once | ORAL | Status: DC

## 2021-06-08 MED ORDER — ONDANSETRON HCL 4 MG PO TABS
4.0000 mg | ORAL_TABLET | Freq: Once | ORAL | Status: AC
  Administered 2021-06-08: 4 mg via ORAL
  Filled 2021-06-08: qty 1

## 2021-06-08 MED ORDER — POLYETHYLENE GLYCOL 3350 17 G PO PACK
17.0000 g | PACK | Freq: Every day | ORAL | Status: DC
  Administered 2021-06-08 – 2021-06-10 (×3): 17 g via ORAL
  Filled 2021-06-08 (×3): qty 1

## 2021-06-08 MED ORDER — DOCUSATE SODIUM 100 MG PO CAPS
100.0000 mg | ORAL_CAPSULE | Freq: Two times a day (BID) | ORAL | Status: DC
  Administered 2021-06-08 – 2021-06-09 (×3): 100 mg via ORAL
  Filled 2021-06-08 (×3): qty 1

## 2021-06-08 MED ORDER — SODIUM CHLORIDE 0.9 % IV SOLN: INTRAVENOUS | Status: DC

## 2021-06-08 MED ORDER — SODIUM CHLORIDE 0.9 % IV SOLN
12.5000 mg | Freq: Four times a day (QID) | INTRAVENOUS | Status: DC | PRN
  Administered 2021-06-08 – 2021-06-11 (×9): 12.5 mg via INTRAVENOUS
  Filled 2021-06-08 (×14): qty 0.5
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
## 2021-06-08 NOTE — Progress Notes (Signed)
Referring Physician(s): CCS  Supervising Physician: Jacqulynn Cadet  Patient Status:  [facility] - In-pt  Chief Complaint: Follow up RLQ abscess drain placed 10/24 in IR  Subjective:  Patient sleeping, arouses to verbal cues and light touch but falls back asleep quickly. No staff or family at bedside.  Allergies: Patient has no known allergies.  Medications: Prior to Admission medications   Medication Sig Start Date End Date Taking? Authorizing Provider  aspirin 81 MG chewable tablet Chew 1 tablet (81 mg total) by mouth daily. 01/21/21  Yes Gladys Damme, MD  atorvastatin (LIPITOR) 80 MG tablet TAKE 1 TABLET (80 MG TOTAL) BY MOUTH DAILY. Patient taking differently: Take 80 mg by mouth daily. 01/21/21 01/21/22 Yes Gladys Damme, MD  ferrous sulfate 325 (65 FE) MG EC tablet Take 1 tablet (325 mg total) by mouth daily with breakfast. 05/12/21  Yes Gatha Mayer, MD     Vital Signs: BP 125/69 (BP Location: Left Arm)   Pulse 83   Temp (!) 97.4 F (36.3 C) (Axillary)   Resp 18   Ht 5\' 11"  (1.803 m)   Wt 136 lb (61.7 kg)   SpO2 96%   BMI 18.97 kg/m   Physical Exam Vitals and nursing note reviewed.  Constitutional:      General: He is not in acute distress. HENT:     Head: Normocephalic.  Cardiovascular:     Rate and Rhythm: Normal rate.  Pulmonary:     Effort: Pulmonary effort is normal.  Abdominal:     Comments: (+) RLQ drain to suction bulb with thick purulent output present. Flushes/aspirates easily. Insertion site unremarkable.  Skin:    General: Skin is warm and dry.    Imaging: CT ABDOMEN PELVIS W CONTRAST  Result Date: 06/06/2021 CLINICAL DATA:  Abdominal pain. EXAM: CT ABDOMEN AND PELVIS WITH CONTRAST TECHNIQUE: Multidetector CT imaging of the abdomen and pelvis was performed using the standard protocol following bolus administration of intravenous contrast. CONTRAST:  35mL OMNIPAQUE IOHEXOL 350 MG/ML SOLN COMPARISON:  CT abdomen pelvis dated 09/17/2019.  FINDINGS: Lower chest: Right lung base linear atelectasis/scarring. The visualized lung bases are otherwise clear. Coronary vascular calcification. No intra-abdominal free air.  No significant free fluid. Hepatobiliary: A 1 cm left hepatic cyst. No intrahepatic biliary dilatation. The gallbladder is unremarkable. Pancreas: Unremarkable. No pancreatic ductal dilatation or surrounding inflammatory changes. Spleen: Normal in size without focal abnormality. Adrenals/Urinary Tract: The adrenal glands are unremarkable. There is no hydronephrosis on either side. There is symmetric enhancement and excretion of contrast by both kidneys. The urinary bladder is grossly unremarkable. Stomach/Bowel: Evaluation of the bowel is limited due to paucity of intra-abdominal fat. There is no bowel obstruction. There is masslike thickening and irregularity of the cecum and proximal ascending colon most concerning for malignancy. Further evaluation with colonoscopy is recommended. The appendix is not identified. Vascular/Lymphatic: Mild aortoiliac atherosclerotic disease. The IVC is grossly unremarkable. No portal venous gas. There is no adenopathy. 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 The prostate gland measures 5.7 cm in transverse diameter. Other: Multilobulated low attenuating mass or collection appears somewhat contiguous with the cecum and proximal ascending colon and extends into the right iliacus and distal psoas muscle measuring approximately 9 x 9 cm in greatest axial dimensions and 13 cm in craniocaudal length. This is concerning for a necrotic malignancy although an infected collection or abscess is not excluded. Musculoskeletal: There is osteopenia. No acute osseous pathology. Severe cachexia.  IMPRESSION: 1. Masslike thickening and irregularity of the cecum and proximal ascending colon most concerning for malignancy. Further evaluation with colonoscopy is  recommended. 2. Multilobulated low attenuating mass or collection appears somewhat contiguous with the cecum and proximal ascending colon and extends into the right iliacus and distal psoas muscle. This is concerning for a necrotic malignancy although an infected collection or abscess is not excluded. 3. 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 4. Aortic Atherosclerosis (ICD10-I70.0). Electronically Signed   By: Anner Crete M.D.   On: 06/06/2021 03:33   DG CHEST PORT 1 VIEW  Result Date: 06/07/2021 CLINICAL DATA:  Sepsis EXAM: PORTABLE CHEST 1 VIEW COMPARISON:  09/17/2019 FINDINGS: Cardiac and mediastinal contours are within normal limits. No focal pulmonary opacity. No pleural effusion or pneumothorax. No acute osseous abnormality. IMPRESSION: No acute cardiopulmonary process. Electronically Signed   By: Merilyn Baba M.D.   On: 06/07/2021 01:40   DG Abdomen Acute W/Chest  Result Date: 06/06/2021 CLINICAL DATA:  Right lower quadrant abdominal pain. Fatigue. Weakness. History of perforated gastric ulcer. EXAM: DG ABDOMEN ACUTE WITH 1 VIEW CHEST COMPARISON:  Abdominopelvic CT and chest radiograph 09/17/2019 FINDINGS: Normal heart size. Unchanged mediastinal contours. No acute airspace disease. No pleural effusion or pneumothorax. No pulmonary edema. No free intra-abdominal air. No bowel dilatation to suggest obstruction. There is air scattered throughout nondilated small and large bowel. No visualized radiopaque calculi. The bones are under mineralized without acute osseous abnormality. IMPRESSION: 1. No bowel obstruction or free air. 2. No acute intrathoracic process. Electronically Signed   By: Keith Rake M.D.   On: 06/06/2021 00:39   CT IMAGE GUIDED FLUID DRAIN BY CATHETER  Result Date: 06/07/2021 INDICATION: 80 year old gentleman with right lower quadrant rim enhancing fluid collection presents to IR for CT-guided abscess drain placement  EXAM: CT-guided abscess drain placement MEDICATIONS: The patient is currently admitted to the hospital and receiving intravenous antibiotics. The antibiotics were administered within an appropriate time frame prior to the initiation of the procedure. ANESTHESIA/SEDATION: Fentanyl 75 mcg IV; Versed 1.5 mg IV Moderate Sedation Time:  16 minutes The patient was continuously monitored during the procedure by the interventional radiology nurse under my direct supervision. COMPLICATIONS: None immediate. PROCEDURE: Informed written consent was obtained from the patient after a thorough discussion of the procedural risks, benefits and alternatives. All questions were addressed. Maximal Sterile Barrier Technique was utilized including caps, mask, sterile gowns, sterile gloves, sterile drape, hand hygiene and skin antiseptic. A timeout was performed prior to the initiation of the procedure. Patient positioned supine on the procedure table. The right lower quadrant skin prepped and draped in usual fashion. Following local lidocaine administration, 18 gauge needle was advanced into the right lower quadrant abscess utilizing CT guidance. 18 gauge needle exchanged for 10 French drain over 0.035 inch guidewire. Drain secured to skin with suture and connected to bulb suction. 30 mL of purulent material was aspirated. Sample sent for Gram stain and culture. IMPRESSION: CT guidance utilized to place 10 [place] multipurpose pigtail drain in right lower quadrant abscess. Electronically Signed   By: Miachel Roux M.D.   On: 06/07/2021 14:47    Labs:  CBC: Recent Labs    06/05/21 2328 06/06/21 1200 06/07/21 0042 06/08/21 0500  WBC 30.5* 27.2* 30.0* 22.5*  HGB 10.1* 9.4* 8.8* 8.2*  HCT 32.5* 29.8* 27.6* 26.1*  PLT 577* 456* 456* 428*    COAGS: Recent Labs    10/19/20 1350  INR 1.1  APTT 38*    BMP: Recent Labs    06/05/21 2328 06/06/21 1200 06/07/21 0042 06/08/21 0500  NA 133* 136 135 136  K 3.6 3.5 3.2* 3.6   CL 96* 99 100 101  CO2 25 26 26 27   GLUCOSE 163* 175* 117* 119*  BUN 14 12 9 12   CALCIUM 8.8* 8.4* 8.1* 8.0*  CREATININE 0.97 0.86 0.72 0.70  GFRNONAA >60 >60 >60 >60    LIVER FUNCTION TESTS: Recent Labs    06/05/21 2328 06/06/21 1200 06/07/21 0042 06/08/21 0500  BILITOT 0.8 0.8 0.7 0.4  AST 40 44* 45* 78*  ALT 31 28 28  42  ALKPHOS 216* 95 106 89  PROT 6.9 6.0* 5.7* 5.4*  ALBUMIN 2.2* 1.9* 1.8* 1.6*    Assessment and Plan:  80 y/o M s/p RLQ drain placement yesterday in IR seen today for drain follow up. Patient sleeping on exam and does not offer any complaints at this time.  Drain Location: RLQ Size: Fr size: 10 Fr Date of placement: 06/07/21  Currently to: Drain collection device: suction bulb 24 hour output:  Output by Drain (mL) 06/06/21 0701 - 06/06/21 1900 06/06/21 1901 - 06/07/21 0700 06/07/21 0701 - 06/07/21 1900 06/07/21 1901 - 06/08/21 0700 06/08/21 0701 - 06/08/21 1504  Closed System Drain 1 RLQ Bulb (JP) 10 Fr.   130  15    Interval imaging/drain manipulation:  None  Current examination: Thick, tan, purulent fluid in bulb Flushes/aspirates easily.  Insertion site unremarkable. Suture and stat lock in place. Dressed appropriately.   Plan: Continue TID flushes with 5 cc NS. Record output Q shift. Dressing changes QD or PRN if soiled.  Call IR APP or on call IR MD if difficulty flushing or sudden change in drain output.  Repeat imaging/possible drain injection once output < 10 mL/QD (excluding flush material.)  Discharge planning: Please contact IR APP or on call IR MD prior to patient d/c to ensure appropriate follow up plans are in place. Typically patient will follow up with IR clinic 10-14 days post d/c for repeat imaging/possible drain injection. IR scheduler will contact patient with date/time of appointment. Patient will need to flush drain QD with 5 cc NS, record output QD, dressing changes every 2-3 days or earlier if soiled.   IR will  continue to follow - please call with questions or concerns.  Electronically Signed: Joaquim Nam, PA-C 06/08/2021, 2:40 PM   I spent a total of 15 Minutes at the the patient's bedside AND on the patient's hospital floor or unit, greater than 50% of which was counseling/coordinating care for RLQ drain follow up.

## 2021-06-08 NOTE — Progress Notes (Signed)
Manufacturing engineer [facility]) Hospital Liaison Note   Received a request to speak to family and patient about [facility] hospice services.  Checked in with bedside to receive okay to visit patient at bedside, but patient resting quietly with eyes closed and did not arouse to verbal stimuli at this time so let him rest leaving hospice service information at bedside.   Then reached out to patient nephew to confirm interest. explain hospice philosophy and our services with out success leaving out contact information via voicemail. TOC and Beside aware of the above.  An Kurten will reach back out to family again this afternoon and attempt another bedside visit tomorrow.    Thank you for the opportunity to participate in this patient's care,  Gar Ponto, RN [facility] (on [place]) [phone]

## 2021-06-08 NOTE — Progress Notes (Signed)
Patient ID: Albert Love, male   DOB: Oct 19, 1940, 80 y.o.   MRN: [id number] [facility] Surgery Progress Note     Subjective: CC-  Feels no different after drain placement by IR yesterday. Continues to have right sided abdominal pain. Some nausea, no emesis. Hiccups bothering him this morning. Passing flatus, cannot remember when his last BM was. He ate about 1/2 hamburger and ice cream for dinner last night.  Objective: Vital signs in last 24 hours: Temp:  [97.9 F (36.6 C)-99.2 F (37.3 C)] 99.1 F (37.3 C) (10/25 0800) Pulse Rate:  [73-93] 73 (10/25 0800) Resp:  [15-21] 19 (10/25 0800) BP: (111-138)/(63-77) 131/76 (10/25 0800) SpO2:  [94 %-99 %] 98 % (10/25 0800) Last BM Date:  (unsure, thinks 2-3 days)  Intake/Output from previous day: 10/24 0701 - 10/25 0700 In: 2120 [P.O.:120; I.V.:2000] Out: 955 [Urine:725; Drains:130] Intake/Output this shift: Total I/O In: -  Out: 15 [Drains:15]  PE: Gen:  Alert, NAD, pleasant Pulm: rate and effort normal Abd: Soft, ND, palpable mass along the right hemiabdomen with some tenderness, RLQ drain with purulent fluid in bulb Psych: A&Ox3 Skin: no rashes noted, warm and dry  Lab Results:  Recent Labs    06/07/21 0042 06/08/21 0500  WBC 30.0* 22.5*  HGB 8.8* 8.2*  HCT 27.6* 26.1*  PLT 456* 428*   BMET Recent Labs    06/07/21 0042 06/08/21 0500  NA 135 136  K 3.2* 3.6  CL 100 101  CO2 26 27  GLUCOSE 117* 119*  BUN 9 12  CREATININE 0.72 0.70  CALCIUM 8.1* 8.0*   PT/INR No results for input(s): LABPROT, INR in the last 72 hours. CMP     Component Value Date/Time   NA 136 06/08/2021 0500   K 3.6 06/08/2021 0500   CL 101 06/08/2021 0500   CO2 27 06/08/2021 0500   GLUCOSE 119 (H) 06/08/2021 0500   BUN 12 06/08/2021 0500   CREATININE 0.70 06/08/2021 0500   CALCIUM 8.0 (L) 06/08/2021 0500   PROT 5.4 (L) 06/08/2021 0500   ALBUMIN 1.6 (L) 06/08/2021 0500   AST 78 (H) 06/08/2021 0500   ALT 42 06/08/2021 0500    ALKPHOS 89 06/08/2021 0500   BILITOT 0.4 06/08/2021 0500   GFRNONAA >60 06/08/2021 0500   GFRAA >60 11/02/2019 0026   Lipase     Component Value Date/Time   LIPASE 23 06/05/2021 2328       Studies/Results: DG CHEST PORT 1 VIEW  Result Date: 06/07/2021 CLINICAL DATA:  Sepsis EXAM: PORTABLE CHEST 1 VIEW COMPARISON:  09/17/2019 FINDINGS: Cardiac and mediastinal contours are within normal limits. No focal pulmonary opacity. No pleural effusion or pneumothorax. No acute osseous abnormality. IMPRESSION: No acute cardiopulmonary process. Electronically Signed   By: Merilyn Baba M.D.   On: 06/07/2021 01:40   CT IMAGE GUIDED FLUID DRAIN BY CATHETER  Result Date: 06/07/2021 INDICATION: 80 year old gentleman with right lower quadrant rim enhancing fluid collection presents to IR for CT-guided abscess drain placement EXAM: CT-guided abscess drain placement MEDICATIONS: The patient is currently admitted to the hospital and receiving intravenous antibiotics. The antibiotics were administered within an appropriate time frame prior to the initiation of the procedure. ANESTHESIA/SEDATION: Fentanyl 75 mcg IV; Versed 1.5 mg IV Moderate Sedation Time:  16 minutes The patient was continuously monitored during the procedure by the interventional radiology nurse under my direct supervision. COMPLICATIONS: None immediate. PROCEDURE: Informed written consent was obtained from the patient after a thorough discussion of the procedural risks,  benefits and alternatives. All questions were addressed. Maximal Sterile Barrier Technique was utilized including caps, mask, sterile gowns, sterile gloves, sterile drape, hand hygiene and skin antiseptic. A timeout was performed prior to the initiation of the procedure. Patient positioned supine on the procedure table. The right lower quadrant skin prepped and draped in usual fashion. Following local lidocaine administration, 18 gauge needle was advanced into the right lower  quadrant abscess utilizing CT guidance. 18 gauge needle exchanged for 10 French drain over 0.035 inch guidewire. Drain secured to skin with suture and connected to bulb suction. 30 mL of purulent material was aspirated. Sample sent for Gram stain and culture. IMPRESSION: CT guidance utilized to place 10 [place] multipurpose pigtail drain in right lower quadrant abscess. Electronically Signed   By: Miachel Roux M.D.   On: 06/07/2021 14:47    Anti-infectives: Anti-infectives (From admission, onward)    Start     Dose/Rate Route Frequency Ordered Stop   06/06/21 1430  piperacillin-tazobactam (ZOSYN) IVPB 3.375 g        3.375 g 12.5 mL/hr over 240 Minutes Intravenous Every 8 hours 06/06/21 1333     06/06/21 0400  piperacillin-tazobactam (ZOSYN) IVPB 3.375 g        3.375 g 100 mL/hr over 30 Minutes Intravenous  Once 06/06/21 0357 06/06/21 0438        Assessment/Plan Colonic mass with retroperitoneal abscess/necrotic tumor - CT 10/23 showing masslike thickening and irregularity of the cecum and proximal ascending colon most concerning for malignancy, multilobulated low-attenuation mass or collection contiguous with cecum and proximal ascending colon extending into the right iliac [place] and distal psoas muscle with concern for necrosis or an infected collection or abscess - CEA 4.8 - s/p IR drain 10/24, culture pending gram stain with GPC - 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  This is not resectable and attempting to do so would be highly morbid-not clear how this would improve his quality or quantity of life.  He is not clearly obstructed and suspect that his progressive weight loss is more related to progressive malignancy.   - Patient and nephew meeting with Palliative medicine team today.  - thorazine for hiccups   FEN: reg diet ID: Continue Zosyn 10/23>> VTE: Okay for chemical prophylaxis from our  standpoint  IDA HTN Protein calorie malnutrition H/o CRAO   LOS: 1 day    Wellington Hampshire, [facility] Surgery 06/08/2021, 8:45 AM Please see Amion for pager number during day hours 7:00am-4:30pm

## 2021-06-08 NOTE — TOC Initial Note (Signed)
Transition of Care [facility]) - Initial/Assessment Note    Patient Details  Name: Albert Love MRN: [id number] Date of Birth: [birth year]  Transition of [facility]) CM/SW Contact:    Albert Bender, RN Phone Number: 06/08/2021, 1:03 PM  Clinical Narrative:                 Spoke to patient about home hospice agencies. Patient requested I speak to his nephew Albert Love to let him decide which agency to use. I called Albert Love and he chose [facility] as his 1st choice and [facility] as 2nd choice. Albert Love with [facility] will go speak to patient about their services.   Expected Discharge Plan: Home w Hospice Care Barriers to Discharge: Continued Medical Work up   Patient Goals and CMS Choice Patient states their goals for this hospitalization and ongoing recovery are:: Return home CMS Medicare.gov Compare Post Acute Care list provided to:: Patient Represenative (must comment) (Nephew, Albert Love) Choice offered to / list presented to : Patient (patient and nephew)  Expected Discharge Plan and Services Expected Discharge Plan: [place]   Discharge Planning Services: CM Consult   Living arrangements for the past 2 months: Apartment                                      Prior Living Arrangements/Services Living arrangements for the past 2 months: Apartment Lives with:: Self Patient language and need for interpreter reviewed:: No        Need for Family Participation in Patient Care: Yes (Comment) Care giver support system in place?: Yes (comment)   Criminal Activity/Legal Involvement Pertinent to Current Situation/Hospitalization: No - Comment as needed  Activities of Daily Living Home Assistive Devices/Equipment: Eyeglasses, Grab bars in shower ADL Screening (condition at time of admission) Patient's cognitive ability adequate to safely complete daily activities?: Yes Is the patient deaf or have difficulty hearing?: No Does the  patient have difficulty seeing, even when wearing glasses/contacts?: No Does the patient have difficulty concentrating, remembering, or making decisions?: No Patient able to express need for assistance with ADLs?: Yes Does the patient have difficulty dressing or bathing?: Yes Independently performs ADLs?: No Communication: Independent Dressing (OT): Needs assistance Is this a change from baseline?: Pre-admission baseline Grooming: Needs assistance Is this a change from baseline?: Pre-admission baseline Feeding: Independent Bathing: Needs assistance Is this a change from baseline?: Pre-admission baseline Toileting: Needs assistance Is this a change from baseline?: Pre-admission baseline In/Out Bed: Needs assistance Is this a change from baseline?: Pre-admission baseline Walks in Home: Needs assistance Is this a change from baseline?: Pre-admission baseline Does the patient have difficulty walking or climbing stairs?: Yes Weakness of Legs: Right Weakness of Arms/Hands: None  Permission Sought/Granted Permission sought to share information with : Facility Sport and exercise psychologist, Family Supports Permission granted to share information with : Yes, Verbal Permission Granted  Share Information with NAME: Albert Love  Permission granted to share info w AGENCY: home hospice  Permission granted to share info w Relationship: nephew     Emotional Assessment Appearance:: Appears stated age Attitude/Demeanor/Rapport: Other (comment) (requesting that i speak to his nephew) Affect (typically observed): Calm, Quiet Orientation: : Oriented to Self, Oriented to Place, Oriented to  Time, Oriented to Situation Alcohol / Substance Use: Not Applicable Psych Involvement: No (comment)  Admission diagnosis:  Dehydration [E86.0] Failure to thrive in adult [R62.7] Colonic mass [K63.89]  Patient Active Problem List   Diagnosis Date Noted   Protein-calorie malnutrition, severe 06/07/2021   Colonic mass  06/06/2021   Anemia 01/21/2021   Inguinal hernia of right side without obstruction or gangrene 01/21/2021   H. 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

## 2021-06-08 NOTE — Progress Notes (Signed)
FPTS Brief Progress Note  S: Patient awake and resting comfortably at time of exam. Denies any complaints at this time.    O: BP 132/89 (BP Location: Left Arm)   Pulse 86   Temp 98.2 F (36.8 C) (Oral)   Resp 20   Ht 5\' 11"  (1.803 m)   Wt 61.7 kg   SpO2 99%   BMI 18.97 kg/m   Gen: male appearing stated age in NAD  A/P: Ollivander See is a 80 y.o. male in NAD, with no new complaints at this time.  - Orders reviewed. Labs for AM ordered, which was adjusted as needed.    Eulis Foster, MD 06/08/2021, 9:26 PM PGY-3, [place] [facility] Night Resident  Please page [phone] with questions.

## 2021-06-08 NOTE — Progress Notes (Addendum)
Family Medicine Teaching Service Daily Progress Note Intern Pager: [phone]  Patient name: Albert Love record number: [id number] Date of birth: 30-Apr-1941 Age: 80 y.o. Gender: male  Primary Care Provider: Gladys Damme, MD Consultants: General surgery, Gastroenterology Code Status: Full  Pt Overview and Major Events to Date:  10/23- Admitted 10/24- CT guided RLQ abscess drain placed  Assessment and Plan:  Sepsis in setting of colonic abscess vs. necrotic malignancy- Resolving Afebrile overnight. IR drain placed without complication. Eating, drinking without difficulty and only has mild pain with movement. Blood cultures with no growth at 2 days. Urine cultures with no growth. Abscess cultures preliminary results with abundant WBC, gram + cocci. CEA 4.8.General surgery continuing to recommend palliative care as the mass is not resectable and discussed with patient that attempting to resect would be highly morbid. Will continue IV antibiotics. Continuing IVF for now as urine output/ PO intake are low. - GI following, appreciate their care - General surgery following, appreciate their care - Continue IV Zosyn (10/24- ) - IVF NS @ 75 ml/hr, will titrate down as PO intake increases - Chlorpromazine for hiccups - PT/OT - Monitor VS  Goals of Care Patient is now DNR per extensive discussion in conjunction with palliative care, patient and nephew Albert Love at family meeting this morning. I discussed medical management/treatment options to gauge his desire for level of aggression he would desire in regards to poor prognosis with current state of health. He is adamant that he values quality of life over quantity and would not desire prolonged intubation, or TPN. General surgery discussed with patient that resection has high morbidity association and that this is highly likely a malignant process. GI discussed with patient that colonoscopy could be performed to confirm suspicion of colon  cancer, but waiting for intra-abdominal abscess to resolve. Started discussion regarding hospice, which patient and Albert Love are agreeable to pursuing upon discharge. TOC working to help find agency for home hospice care. - Palliative medicine following, greatly appreciate their care - GI following peripherally if patient desires colonoscopy in the future - Thorazine for hiccups   Chronic microcytic IDA Hgb 8.2 this morning, continuing to trend downward. Likely due to chronic blood loss and iron deficiency. Patient is asymptomatic including no shortness of breath, tachycardia, dizziness. - D/c'ed ferrous sulfate PO supplementation  - Will consider IV iron transfusion tomorrow (10/26) - F/u Ferritin, TIBC - Monitor for signs of bleeding - Transfusion threshold < 7.0, no known history of CAD - Daily CBC  Thrombocytosis Stable. Plt 428. Likely d/t anemia and infection. - Monitor with daily labs  Cachexia; hypoalbuminemia with malnutrition related to chronic illness Diet resumed s/p IR drain placement. RD consulted with patient and recommended intervention as below: - Ensure alive TID - Magic cup TID with meals - MV  FEN/GI: Regular PPx: SCDs Dispo:Ongoing inpatient care. Awaiting PT recommendatioins.  Subjective:  Albert Love has no major complaints this morning. He has hiccups which are bothersome to him. He denies significant pain, and says that he had an episode of nausea that self-resolved.  Objective: Temp:  [97.9 F (36.6 C)-99.2 F (37.3 C)] 99.1 F (37.3 C) (10/25 0800) Pulse Rate:  [73-93] 73 (10/25 0800) Resp:  [15-21] 19 (10/25 0800) BP: (111-138)/(63-77) 131/76 (10/25 0800) SpO2:  [94 %-99 %] 98 % (10/25 0800) Physical Exam: General: Laying in bed comfortably, eating biscuit Cardiovascular: RRR Respiratory: CTAB, difficult to auscultate Abdomen: Thin, normoactive bowel sounds. Tender in RLQ near drain. Dry, dressing intact. Small amount of  purulent, white fluid in  drain Extremities: Cachectic, thin, warm, dry, well-perfused  Laboratory: Recent Labs  Lab 06/06/21 1200 06/07/21 0042 06/08/21 0500  WBC 27.2* 30.0* 22.5*  HGB 9.4* 8.8* 8.2*  HCT 29.8* 27.6* 26.1*  PLT 456* 456* 428*   Recent Labs  Lab 06/06/21 1200 06/07/21 0042 06/08/21 0500  NA 136 135 136  K 3.5 3.2* 3.6  CL 99 100 101  CO2 26 26 27   BUN 12 9 12   CREATININE 0.86 0.72 0.70  CALCIUM 8.4* 8.1* 8.0*  PROT 6.0* 5.7* 5.4*  BILITOT 0.8 0.7 0.4  ALKPHOS 95 106 89  ALT 28 28 42  AST 44* 45* 78*  GLUCOSE 175* 117* 119*    Imaging/Diagnostic Tests: CT IMAGE GUIDED FLUID DRAIN BY CATHETER  Result Date: 06/07/2021 INDICATION: 80 year old gentleman with right lower quadrant rim enhancing fluid collection presents to IR for CT-guided abscess drain placement EXAM: CT-guided abscess drain placement MEDICATIONS: The patient is currently admitted to the hospital and receiving intravenous antibiotics. The antibiotics were administered within an appropriate time frame prior to the initiation of the procedure. ANESTHESIA/SEDATION: Fentanyl 75 mcg IV; Versed 1.5 mg IV Moderate Sedation Time:  16 minutes The patient was continuously monitored during the procedure by the interventional radiology nurse under my direct supervision. COMPLICATIONS: None immediate. PROCEDURE: Informed written consent was obtained from the patient after a thorough discussion of the procedural risks, benefits and alternatives. All questions were addressed. Maximal Sterile Barrier Technique was utilized including caps, mask, sterile gowns, sterile gloves, sterile drape, hand hygiene and skin antiseptic. A timeout was performed prior to the initiation of the procedure. Patient positioned supine on the procedure table. The right lower quadrant skin prepped and draped in usual fashion. Following local lidocaine administration, 18 gauge needle was advanced into the right lower quadrant abscess utilizing CT guidance. 18 gauge  needle exchanged for 10 French drain over 0.035 inch guidewire. Drain secured to skin with suture and connected to bulb suction. 30 mL of purulent material was aspirated. Sample sent for Gram stain and culture. IMPRESSION: CT guidance utilized to place 10 [place] multipurpose pigtail drain in right lower quadrant abscess. Electronically Signed   By: Miachel Roux M.D.   On: 06/07/2021 14:47     Orvis Brill, DO 06/08/2021, 9:04 AM PGY-1, [place] Intern pager: [phone], text pages welcome

## 2021-06-08 NOTE — Progress Notes (Signed)
FPTS Interim Progress Note  S: patient found resting comfortably.   O: BP 138/77 (BP Location: Left Arm)   Pulse 86   Temp 98.7 F (37.1 C) (Oral)   Resp 20   Ht 5\' 11"  (1.803 m)   Wt 136 lb (61.7 kg)   SpO2 97%   BMI 18.97 kg/m    Physical exam General: NAD, resting peacefully Pulmonary: unlabored respirations  A/P: No changes, remainder per day team.   Corky Sox, MD PGY-1 [place] Intern pager: [phone], text pages welcome

## 2021-06-08 NOTE — Progress Notes (Addendum)
PT Cancellation Note  Patient Details Name: Albert Love MRN: [id number] DOB: 04-Aug-1941   Cancelled Treatment:    Reason Eval/Treat Not Completed: Fatigue/lethargy limiting ability to participate  Patient reports his hiccups have recently stopped (has had for hours) and he just wants to rest at this time. He states he just wants to look at the quality of his life and is not sure how PT can help with this. Briefly discussed and he agreed PT may return later today to further assess.   Arby Barrette, PT Acute Rehabilitation Services  Pager [phone] Office [phone]   Addendum (1508)-Returned for PT evaluation. Pt sleeping at this time and did not arouse to knock at door or name being called. Will return 10/26 to complete evaluation.    Arby Barrette, PT Acute Rehabilitation Services  Pager [phone] Office [phone]    Rexanne Mano 06/08/2021, 11:49 AM

## 2021-06-08 NOTE — Progress Notes (Addendum)
Progress Note   Subjective  Chief complaint: Abdominal mass/abdominal abscess status post drain placement by IR 06/07/2021  Patient tells me this morning that he is feeling okay, he has passed a lot of gas but no stool.  Does have a fair amount of abdominal pain around where the drain was placed, but no new complaints today.  Aware that we are holding off on colonoscopy.   Objective   Vital signs in last 24 hours: Temp:  [97.9 F (36.6 C)-99.2 F (37.3 C)] 99.1 F (37.3 C) (10/25 0800) Pulse Rate:  [73-93] 73 (10/25 0800) Resp:  [15-21] 19 (10/25 0800) BP: (111-138)/(63-77) 131/76 (10/25 0800) SpO2:  [94 %-99 %] 98 % (10/25 0800) Last BM Date:  (unsure, thinks 2-3 days) General:    AA male in NAD Heart:  Regular rate and rhythm; no murmurs Lungs: Respirations even and unlabored, lungs CTA bilaterally Abdomen:  Soft, nontender and nondistended. Normal bowel sounds.+ Drain placement on the right side of the abdomen, clean bandages, tender to palpation Psych:  Cooperative. Normal mood and affect.  Intake/Output from previous day: 10/24 0701 - 10/25 0700 In: 2120 [P.O.:120; I.V.:2000] Out: 955 [Urine:725; Drains:130] Intake/Output this shift: Total I/O In: -  Out: 15 [Drains:15]  Lab Results: Recent Labs    06/06/21 1200 06/07/21 0042 06/08/21 0500  WBC 27.2* 30.0* 22.5*  HGB 9.4* 8.8* 8.2*  HCT 29.8* 27.6* 26.1*  PLT 456* 456* 428*   BMET Recent Labs    06/06/21 1200 06/07/21 0042 06/08/21 0500  NA 136 135 136  K 3.5 3.2* 3.6  CL 99 100 101  CO2 26 26 27   GLUCOSE 175* 117* 119*  BUN 12 9 12   CREATININE 0.86 0.72 0.70  CALCIUM 8.4* 8.1* 8.0*   LFT Recent Labs    06/08/21 0500  PROT 5.4*  ALBUMIN 1.6*  AST 78*  ALT 42  ALKPHOS 89  BILITOT 0.4    Studies/Results: DG CHEST PORT 1 VIEW  Result Date: 06/07/2021 CLINICAL DATA:  Sepsis EXAM: PORTABLE CHEST 1 VIEW COMPARISON:  09/17/2019 FINDINGS: Cardiac and mediastinal contours are within normal  limits. No focal pulmonary opacity. No pleural effusion or pneumothorax. No acute osseous abnormality. IMPRESSION: No acute cardiopulmonary process. Electronically Signed   By: Merilyn Baba M.D.   On: 06/07/2021 01:40   CT IMAGE GUIDED FLUID DRAIN BY CATHETER  Result Date: 06/07/2021 INDICATION: 80 year old gentleman with right lower quadrant rim enhancing fluid collection presents to IR for CT-guided abscess drain placement EXAM: CT-guided abscess drain placement MEDICATIONS: The patient is currently admitted to the hospital and receiving intravenous antibiotics. The antibiotics were administered within an appropriate time frame prior to the initiation of the procedure. ANESTHESIA/SEDATION: Fentanyl 75 mcg IV; Versed 1.5 mg IV Moderate Sedation Time:  16 minutes The patient was continuously monitored during the procedure by the interventional radiology nurse under my direct supervision. COMPLICATIONS: None immediate. PROCEDURE: Informed written consent was obtained from the patient after a thorough discussion of the procedural risks, benefits and alternatives. All questions were addressed. Maximal Sterile Barrier Technique was utilized including caps, mask, sterile gowns, sterile gloves, sterile drape, hand hygiene and skin antiseptic. A timeout was performed prior to the initiation of the procedure. Patient positioned supine on the procedure table. The right lower quadrant skin prepped and draped in usual fashion. Following local lidocaine administration, 18 gauge needle was advanced into the right lower quadrant abscess utilizing CT guidance. 18 gauge needle exchanged for 10 French drain over 0.035 inch  guidewire. Drain secured to skin with suture and connected to bulb suction. 30 mL of purulent material was aspirated. Sample sent for Gram stain and culture. IMPRESSION: CT guidance utilized to place 10 [place] multipurpose pigtail drain in right lower quadrant abscess. Electronically Signed   By: Miachel Roux M.D.   On: 06/07/2021 14:47      Assessment / Plan:   Assessment: 1.  Cecal mass with question of necrosis/abscess and marked leukocytosis: History of severe weight loss, malnutrition and iron deficiency anemia, abnormal cecum first seen on CT scan late January 2021, now status post IR drain placement on 06/07/2021 with draining of abscess, some residual abdominal pain 2.  Chronic iron deficiency anemia: Some of this related to above  Plan: 1.  No plans for colonoscopy now.  Would need to wait at least a few weeks out from drain placement.  Discussed with patient he does not wish to proceed with this if it is not going to "help" anything. 2.  Continue supportive measures including antibiotics 3.  Surgery has recommended palliative care consult today.   We will likely sign off.  Please let us know if we can be of any further assistance.   LOS: 1 day   Levin Erp  06/08/2021, 9:19 AM  I have taken a history, reviewed the chart and examined the patient. I performed a substantive portion of this encounter, including complete performance of at least one of the key components, in conjunction with the APP. I agree with the APP's note, impression and recommendations  Very pleasant 80 year old admitted with cecal mass with large pericecal/pelvic abscess, status post IR drainage yesterday.  GI consulted for potential diagnostic colonoscopy.  Colon cancer is almost a certain diagnosis for this unfortunate patient who has experienced several months of unintentional weight loss and has never had a colonscopy.  Other etiologies such as perforated diverticulitis are much less likely given his presenting symptoms.   I explained to him that we could perform a colonoscopy to confirm the suspicion of colon cancer, but that I recommended we wait until his intra-abdominal abscess has better resolved.   I think that the pre-test probability of a malignancy is very high.  GI will follow peripherally  to see if he would like to proceed with a colonoscopy in the near future.    Ricki Vanhandel E. Candis Schatz, MD [facility] Gastroenterology

## 2021-06-09 DIAGNOSIS — L0291 Cutaneous abscess, unspecified: Secondary | ICD-10-CM | POA: Diagnosis not present

## 2021-06-09 DIAGNOSIS — Z515 Encounter for palliative care: Secondary | ICD-10-CM | POA: Diagnosis not present

## 2021-06-09 DIAGNOSIS — R627 Adult failure to thrive: Secondary | ICD-10-CM | POA: Diagnosis not present

## 2021-06-09 DIAGNOSIS — K6389 Other specified diseases of intestine: Secondary | ICD-10-CM | POA: Diagnosis not present

## 2021-06-09 LAB — CBC WITH DIFFERENTIAL/PLATELET
Abs Immature Granulocytes: 0.13 10*3/uL — ABNORMAL HIGH (ref 0.00–0.07)
Basophils Absolute: 0 10*3/uL (ref 0.0–0.1)
Basophils Relative: 0 %
Eosinophils Absolute: 0.2 10*3/uL (ref 0.0–0.5)
Eosinophils Relative: 1 %
HCT: 27.8 % — ABNORMAL LOW (ref 39.0–52.0)
Hemoglobin: 8.4 g/dL — ABNORMAL LOW (ref 13.0–17.0)
Immature Granulocytes: 1 %
Lymphocytes Relative: 11 %
Lymphs Abs: 1.9 10*3/uL (ref 0.7–4.0)
MCH: 20.4 pg — ABNORMAL LOW (ref 26.0–34.0)
MCHC: 30.2 g/dL (ref 30.0–36.0)
MCV: 67.6 fL — ABNORMAL LOW (ref 80.0–100.0)
Monocytes Absolute: 1 10*3/uL (ref 0.1–1.0)
Monocytes Relative: 6 %
Neutro Abs: 13.4 10*3/uL — ABNORMAL HIGH (ref 1.7–7.7)
Neutrophils Relative %: 81 %
Platelets: 429 10*3/uL — ABNORMAL HIGH (ref 150–400)
RBC: 4.11 MIL/uL — ABNORMAL LOW (ref 4.22–5.81)
RDW: 15.6 % — ABNORMAL HIGH (ref 11.5–15.5)
WBC: 16.7 10*3/uL — ABNORMAL HIGH (ref 4.0–10.5)
nRBC: 0 % (ref 0.0–0.2)

## 2021-06-09 LAB — COMPREHENSIVE METABOLIC PANEL
ALT: 65 U/L — ABNORMAL HIGH (ref 0–44)
AST: 123 U/L — ABNORMAL HIGH (ref 15–41)
Albumin: 1.5 g/dL — ABNORMAL LOW (ref 3.5–5.0)
Alkaline Phosphatase: 91 U/L (ref 38–126)
Anion gap: 7 (ref 5–15)
BUN: 11 mg/dL (ref 8–23)
CO2: 26 mmol/L (ref 22–32)
Calcium: 7.9 mg/dL — ABNORMAL LOW (ref 8.9–10.3)
Chloride: 105 mmol/L (ref 98–111)
Creatinine, Ser: 0.84 mg/dL (ref 0.61–1.24)
GFR, Estimated: >60 mL/min (ref >60)
Glucose, Bld: 110 mg/dL — ABNORMAL HIGH (ref 70–99)
Potassium: 3.8 mmol/L (ref 3.5–5.1)
Sodium: 138 mmol/L (ref 135–145)
Total Bilirubin: 0.4 mg/dL (ref 0.3–1.2)
Total Protein: 5.4 g/dL — ABNORMAL LOW (ref 6.5–8.1)

## 2021-06-09 LAB — GLUCOSE, CAPILLARY
Glucose-Capillary: 103 mg/dL — ABNORMAL HIGH (ref 70–99)
Glucose-Capillary: 107 mg/dL — ABNORMAL HIGH (ref 70–99)
Glucose-Capillary: 146 mg/dL — ABNORMAL HIGH (ref 70–99)
Glucose-Capillary: 158 mg/dL — ABNORMAL HIGH (ref 70–99)

## 2021-06-09 LAB — MAGNESIUM: Magnesium: 1.9 mg/dL (ref 1.7–2.4)

## 2021-06-09 MED ORDER — MORPHINE SULFATE (PF) 4 MG/ML IV SOLN
4.0000 mg | Freq: Once | INTRAVENOUS | Status: AC
Start: 2021-06-10 — End: 2021-06-09
  Administered 2021-06-09: 4 mg via INTRAVENOUS
  Filled 2021-06-09: qty 1

## 2021-06-09 MED ORDER — POLYETHYLENE GLYCOL 3350 17 G PO PACK
17.0000 g | PACK | Freq: Every day | ORAL | Status: DC | PRN
  Administered 2021-06-09: 17 g via ORAL
  Filled 2021-06-09: qty 1

## 2021-06-09 MED ORDER — SODIUM CHLORIDE 0.9 % IV SOLN
2.0000 g | INTRAVENOUS | Status: DC
  Administered 2021-06-09: 2 g via INTRAVENOUS
  Filled 2021-06-09: qty 20

## 2021-06-09 NOTE — Progress Notes (Signed)
   Palliative Medicine Inpatient Follow Up Note  Consulting Provider: Caroll Rancher   Reason for consult:   [facility] Palliative Medicine Consult  Reason for Consult? Goals of care in light of possible abdominal surgery    HPI:  Per intake H&P --> Albert Love is a 70 year: PMH significant for peptic ulcer s/p [name] patch, H. Pylori, CRAO, prior intra-abdominal abscesses with drains. 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  This is not resectable.   Palliative care has been asked to get involved to further address goals of care.  Today's Discussion (06/09/2021):  *Please note that this is a verbal dictation therefore any spelling or grammatical errors are due to the "[place]" system interpretation.  Chart reviewed.   I met with Albert Love at bedside. He shares with me that he has spoken to the surgical team. We reviewed his present clinical situation in terms of his likely necrotic tumor. We discussed that he is in a position where he will not be a candidate for surgery which Albert Love shares awareness of. I offered him time to express himself. His main goals are to not suffer at the end of life. He is very much in agreement with hospice care. We reviewed that the hospice liaison plans to speak to he and his nephew, Albert Love to further discuss logistics as Albert Love will require 24/7 support during his end of life journey.   Questions and concerns addressed   Objective Assessment: Vital Signs Vitals:   06/09/21 0757 06/09/21 1158  BP: (!) 147/90 137/79  Pulse: 95 88  Resp: 20 19  Temp: 98.7 F (37.1 C) 98.9 F (37.2 C)  SpO2: 98% 97%     Intake/Output Summary (Last 24 hours) at 06/09/2021 1348 Last data filed at 06/09/2021 8119 Gross per 24 hour  Intake 1552.12 ml  Output 865 ml  Net 687.12 ml   Last Weight  Most recent update: 06/05/2021 10:45 PM    Weight  61.7 kg (136 lb)            Gen: Very frail elderly African-American male in no acute distress HEENT: moist mucous membranes CV: Regular rate and rhythm, no murmurs rubs or gallops PULM: clear to auscultation bilaterally ABD: (+) R side drain w/ dressing in place EXT: No edema Neuro: Alert and oriented x3  SUMMARY OF RECOMMENDATIONS   DNAR/DNI   MOST completed and placed on chart   Appreciate [facility] Liaison helping with discharge plan   Ongoing palliative care support during hospitalization  Time Spent: 25 Greater than 50% of the time was spent in counseling and coordination of care ______________________________________________________________________________________ [place] Team Team Cell Phone: [phone] Please utilize secure chat with additional questions, if there is no response within 30 minutes please call the above phone number  Palliative Medicine Team providers are available by phone from 7am to 7pm daily and can be reached through the team cell phone.  Should this patient require assistance outside of these hours, please call the patient's attending physician.

## 2021-06-09 NOTE — Progress Notes (Signed)
Patient ID: Albert Love, male   DOB: 09-Oct-1940, 80 y.o.   MRN: [id number] [facility] Surgery Progress Note     Subjective: CC-  No family at bedside. Sitting up in bed eating breakfast. Overall feels about the same. He continues to have some right sided abdominal pain. Nausea at times, no emesis. Tolerating diet. Passing small amount of flatus. Planning home with hospice.  Objective: Vital signs in last 24 hours: Temp:  [97.4 F (36.3 C)-98.7 F (37.1 C)] 98.7 F (37.1 C) (10/26 0757) Pulse Rate:  [79-95] 95 (10/26 0757) Resp:  [12-20] 20 (10/26 0757) BP: (125-147)/(69-90) 147/90 (10/26 0757) SpO2:  [96 %-99 %] 98 % (10/26 0757) Last BM Date:  (unsure, thinks 2-3 days)  Intake/Output from previous day: 10/25 0701 - 10/26 0700 In: 1552.1 [I.V.:1371.3; IV Piggyback:180.8] Out: 880 [Urine:825; Drains:40] Intake/Output this shift: No intake/output data recorded.  PE: Gen:  Alert, NAD, pleasant Pulm: rate and effort normal Abd: Soft, ND, palpable mass along the right hemiabdomen with some tenderness, RLQ drain with purulent fluid in bulb Psych: A&Ox3 Skin: no rashes noted, warm and dry   Lab Results:  Recent Labs    06/08/21 0500 06/09/21 0230  WBC 22.5* 16.7*  HGB 8.2* 8.4*  HCT 26.1* 27.8*  PLT 428* 429*   BMET Recent Labs    06/08/21 0500 06/09/21 0230  NA 136 138  K 3.6 3.8  CL 101 105  CO2 27 26  GLUCOSE 119* 110*  BUN 12 11  CREATININE 0.70 0.84  CALCIUM 8.0* 7.9*   PT/INR No results for input(s): LABPROT, INR in the last 72 hours. CMP     Component Value Date/Time   NA 138 06/09/2021 0230   K 3.8 06/09/2021 0230   CL 105 06/09/2021 0230   CO2 26 06/09/2021 0230   GLUCOSE 110 (H) 06/09/2021 0230   BUN 11 06/09/2021 0230   CREATININE 0.84 06/09/2021 0230   CALCIUM 7.9 (L) 06/09/2021 0230   PROT 5.4 (L) 06/09/2021 0230   ALBUMIN 1.5 (L) 06/09/2021 0230   AST 123 (H) 06/09/2021 0230   ALT 65 (H) 06/09/2021 0230   ALKPHOS 91 06/09/2021  0230   BILITOT 0.4 06/09/2021 0230   GFRNONAA >60 06/09/2021 0230   GFRAA >60 11/02/2019 0026   Lipase     Component Value Date/Time   LIPASE 23 06/05/2021 2328       Studies/Results: CT IMAGE GUIDED FLUID DRAIN BY CATHETER  Result Date: 06/07/2021 INDICATION: 80 year old gentleman with right lower quadrant rim enhancing fluid collection presents to IR for CT-guided abscess drain placement EXAM: CT-guided abscess drain placement MEDICATIONS: The patient is currently admitted to the hospital and receiving intravenous antibiotics. The antibiotics were administered within an appropriate time frame prior to the initiation of the procedure. ANESTHESIA/SEDATION: Fentanyl 75 mcg IV; Versed 1.5 mg IV Moderate Sedation Time:  16 minutes The patient was continuously monitored during the procedure by the interventional radiology nurse under my direct supervision. COMPLICATIONS: None immediate. PROCEDURE: Informed written consent was obtained from the patient after a thorough discussion of the procedural risks, benefits and alternatives. All questions were addressed. Maximal Sterile Barrier Technique was utilized including caps, mask, sterile gowns, sterile gloves, sterile drape, hand hygiene and skin antiseptic. A timeout was performed prior to the initiation of the procedure. Patient positioned supine on the procedure table. The right lower quadrant skin prepped and draped in usual fashion. Following local lidocaine administration, 18 gauge needle was advanced into the right lower quadrant abscess  utilizing CT guidance. 18 gauge needle exchanged for 10 French drain over 0.035 inch guidewire. Drain secured to skin with suture and connected to bulb suction. 30 mL of purulent material was aspirated. Sample sent for Gram stain and culture. IMPRESSION: CT guidance utilized to place 10 [place] multipurpose pigtail drain in right lower quadrant abscess. Electronically Signed   By: Miachel Roux M.D.   On: 06/07/2021  14:47    Anti-infectives: Anti-infectives (From admission, onward)    Start     Dose/Rate Route Frequency Ordered Stop   06/06/21 1430  piperacillin-tazobactam (ZOSYN) IVPB 3.375 g        3.375 g 12.5 mL/hr over 240 Minutes Intravenous Every 8 hours 06/06/21 1333     06/06/21 0400  piperacillin-tazobactam (ZOSYN) IVPB 3.375 g        3.375 g 100 mL/hr over 30 Minutes Intravenous  Once 06/06/21 0357 06/06/21 0438        Assessment/Plan Colonic mass with retroperitoneal abscess/necrotic tumor - CT 10/23 showing masslike thickening and irregularity of the cecum and proximal ascending colon most concerning for malignancy, multilobulated low-attenuation mass or collection contiguous with cecum and proximal ascending colon extending into the right iliac [place] and distal psoas muscle with concern for necrosis or an infected collection or abscess - this is highly suspicious for a malignant process and is not resectable - CEA 4.8 - s/p IR drain 10/24, culture pending gram stain with GPC - Patient is planning home with hospice. Continue IR drain and follow up in drain clinic. Continue IV antibiotics and follow culture, narrow to PO antibiotics when data available. Recommend continuing antibiotics for at least 1 week since drain placement. We will sign off, please call with questions or concerns.   FEN: reg diet ID: Continue Zosyn 10/23>> VTE: Okay for chemical prophylaxis from our standpoint   IDA HTN Protein calorie malnutrition H/o CRAO   LOS: 2 days    Wellington Hampshire, [facility] Surgery 06/09/2021, 8:34 AM Please see Amion for pager number during day hours 7:00am-4:30pm

## 2021-06-09 NOTE — Progress Notes (Signed)
[employer]ufacturing [employer] [facility]) Hospice hospital liaison note  Liaison followed up with patient's nephew [name] regarding providing information and answering questions surrounding hospice care in the home. [name] verbalized appreciation for information provided and relayed that he would like to have a phone meeting when he visits with patient this afternoon.  Liaison will be available when family is ready to further discuss services.   Please don't hesitate to call for any hospice related questions or concerns.   Thank you,  Jhonnie Garner, BSN, RN, [facility] hospital liaison [phone]

## 2021-06-09 NOTE — Progress Notes (Signed)
PT Cancellation Note  Patient Details Name: Albert Love MRN: [id number] DOB: May 28, 1941   Cancelled Treatment:    Reason Eval/Treat Not Completed: Patient currently refusing PT evaluation as he does not see the purpose.   His understanding is he will NOT be returning home, but will be going to residential Hospice. He is not interested in being able to get up to walk to the bathroom or a BSC. He feels too weak and does not think walking or PT are appropriate for him any longer. Attempted to discuss with MD--awaiting return call.    Arby Barrette, PT Acute Rehabilitation Services  Pager [phone] Office [phone]    Rexanne Mano 06/09/2021, 10:26 AM

## 2021-06-09 NOTE — Progress Notes (Signed)
  Inpatient Rehab Admissions Coordinator :  Per  OT therapy recommendations patient was screened for CIR candidacy by Danne Baxter RN MSN. Noted to pursue Hospice. We will not pursue CIR admit at this time.  Danne Baxter RN MSN Admissions Coordinator [phone]

## 2021-06-09 NOTE — Progress Notes (Addendum)
Family Medicine Teaching Service Daily Progress Note Intern Pager: [phone]  Patient name: Albert Love record number: [id number] Date of birth: October 07, 1940 Age: 80 y.o. Gender: male  Primary Care Provider: Gladys Damme, MD Consultants: General surgery, Gastroenterology Code Status: Full  Pt Overview and Major Events to Date:  10/23- Admitted 10/24- CT guided RLQ abscess drain placed  Assessment and Plan: Albert Love is a 80 year-old male receiving IV antibiotic treatment s/p RLQ drain placement for cecal mass/abscess, now pursuing hospice care as this is most likely malignant. PMH significant for peptic ulcer s/p [name] patch, H. Pylori, CRAO, prior intra-abdominal abscess with drains.  Sepsis (resolved) in setting of cecal mass/abscess s/p RLQ abscess drain placement (10/24) Afebrile overnight. No significant pain, only with movement.WBC trending downward to 16.7. Mg 1.9. On day #3 of Zosyn, will continue until culture returns and then narrow to PO appropriately. General surgery recommending antibiotics for 1 week outpatient. Awaiting abscess culture, gram stain with gram + cocci and WBC. IR drain in place with continued drainage, will follow up outpatient with IR in 10-14 days for repeat imaging/possible drain injection. Patient will continue home care with flushing/dressing changes. Has yet to work with PT as he had hiccups and wanted to rest yesterday. 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 Ferritin wnl 279, Iron 19, TIBC 160- indicative of both iron deficiency and malnutrition. - Monitor with CBC  Goals of Care Patient is DNR and values quality of life moving forward. He would not desire TPN or prolonged  intubation. Patient and nephew understand that this mass is likely malignant and prognosis is poor. Patient's nephew chose [facility] as first choice for hospice with expected discharge plan home with home hospice with [name] caring patient at home. Will await TOC CM/SW final decisions. 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 AST/ALT increased to 123/65- will d/c statin. Total UOP for past 24 hours 834mL (0.6 mL/kg/hr) - Monitor PO intake - Ensure Alive TID - Magic Cup TID with meals - Assistance with meals - MV - Assistance with eating order placed   FEN/GI: Regular PPx: SCDs Dispo:Pending source control, return of cultures- will continue IV antibiotics. Likely home with home hospice, awaiting final decision  Subjective:  Mr. Albert Love has no major complaints this morning. He has had small bouts of nausea that resolves with chlorpromazine. He is understanding and amenable to home hospice as best disposition option. He had breakfast tray which and he was hungry with decent appetite but he was unable to cut up his own food, and needed my assistance to eat.   Objective: Temp:  [97.4 F (36.3 C)-99.1 F (37.3 C)] 98.5 F (36.9 C) (10/26 0501) Pulse Rate:  [73-86] 79 (10/26 0501) Resp:  [12-20] 12 (10/26 0501) BP: (125-137)/(69-89) 137/71 (10/26 0501) SpO2:  [96 %-99 %] 98 % (10/26 0501) Physical Exam: General: Chronically-ill appearing elderly male laying in bed, resting comfortably Cardiovascular: RRR Respiratory: CTAB, difficult to auscultate Abdomen: Thin, normoactive bowel sounds. Dry dressing intact in RLQ drain. Small amount of purulent fluid in drain Extremities: Cachetic, thin, warm, dry, well-perfused  Laboratory: Recent Labs  Lab 06/07/21 0042 06/08/21 0500 06/09/21 0230  WBC 30.0* 22.5* 16.7*   HGB 8.8* 8.2* 8.4*  HCT 27.6* 26.1* 27.8*  PLT 456* 428* 429*   Recent Labs  Lab 06/07/21 0042 06/08/21 0500 06/09/21 0230  NA 135 136 138  K 3.2* 3.6 3.8  CL 100 101 105  CO2 26 27 26   BUN 9 12 11   CREATININE 0.72 0.70 0.84  CALCIUM 8.1* 8.0* 7.9*  PROT 5.7* 5.4* 5.4*  BILITOT 0.7 0.4 0.4  ALKPHOS 106 89 91  ALT 28 42 65*  AST 45* 78* 123*  GLUCOSE 117* 119* 110*      Imaging/Diagnostic Tests: No results found.   Orvis Brill, DO 06/09/2021, 7:00 AM PGY-1, [place] Intern pager: [phone], text pages welcome

## 2021-06-09 NOTE — Progress Notes (Signed)
FPTS Brief Progress Note  S:Paged by RN that pt is having abdominal pain. Pt reports diffuse abdominal pain. States he has not had a bowel movement since admission.    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 Next dose due shortly   - Orders reviewed. Labs for AM ordered, which was adjusted as needed.    Lyndee Hensen, DO 06/09/2021, 11:29 PM PGY-3, [facility] Night Resident  Please page [phone] with questions.

## 2021-06-09 NOTE — Progress Notes (Signed)
Referring Physician(s): Dr Lyndel Safe; and CCS  Supervising Physician: Arne Cleveland  Patient Status:  [facility] - In-pt  Chief Complaint:  RLQ abscess drain placed in IR 10/24  Subjective:  Colonic mass with retroperitoneal abscess/necrotic tumor Resting Says he is better OP 40 daily   Allergies: Patient has no known allergies.  Medications: Prior to Admission medications   Medication Sig Start Date End Date Taking? Authorizing Provider  aspirin 81 MG chewable tablet Chew 1 tablet (81 mg total) by mouth daily. 01/21/21  Yes Gladys Damme, MD  atorvastatin (LIPITOR) 80 MG tablet TAKE 1 TABLET (80 MG TOTAL) BY MOUTH DAILY. Patient taking differently: Take 80 mg by mouth daily. 01/21/21 01/21/22 Yes Gladys Damme, MD  ferrous sulfate 325 (65 FE) MG EC tablet Take 1 tablet (325 mg total) by mouth daily with breakfast. 05/12/21  Yes Gatha Mayer, MD     Vital Signs: BP (!) 147/90 (BP Location: Left Arm)   Pulse 95   Temp 98.7 F (37.1 C) (Oral)   Resp 20   Ht 5\' 11"  (1.803 m)   Wt 136 lb (61.7 kg)   SpO2 98%   BMI 18.97 kg/m   Physical Exam Vitals reviewed.  Skin:    General: Skin is warm.     Comments: Site is c/d/I OP yellow color Flush is blood tinged Flushes easily No pain NGTD  Neurological:     Mental Status: He is alert.    Imaging: CT ABDOMEN PELVIS W CONTRAST  Result Date: 06/06/2021 CLINICAL DATA:  Abdominal pain. EXAM: CT ABDOMEN AND PELVIS WITH CONTRAST TECHNIQUE: Multidetector CT imaging of the abdomen and pelvis was performed using the standard protocol following bolus administration of intravenous contrast. CONTRAST:  61mL OMNIPAQUE IOHEXOL 350 MG/ML SOLN COMPARISON:  CT abdomen pelvis dated 09/17/2019. FINDINGS: Lower chest: Right lung base linear atelectasis/scarring. The visualized lung bases are otherwise clear. Coronary vascular calcification. No intra-abdominal free air.  No significant free fluid. Hepatobiliary: A 1 cm left hepatic cyst.  No intrahepatic biliary dilatation. The gallbladder is unremarkable. Pancreas: Unremarkable. No pancreatic ductal dilatation or surrounding inflammatory changes. Spleen: Normal in size without focal abnormality. Adrenals/Urinary Tract: The adrenal glands are unremarkable. There is no hydronephrosis on either side. There is symmetric enhancement and excretion of contrast by both kidneys. The urinary bladder is grossly unremarkable. Stomach/Bowel: Evaluation of the bowel is limited due to paucity of intra-abdominal fat. There is no bowel obstruction. There is masslike thickening and irregularity of the cecum and proximal ascending colon most concerning for malignancy. Further evaluation with colonoscopy is recommended. The appendix is not identified. Vascular/Lymphatic: Mild aortoiliac atherosclerotic disease. The IVC is grossly unremarkable. No portal venous gas. There is no adenopathy. 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 The prostate gland measures 5.7 cm in transverse diameter. Other: Multilobulated low attenuating mass or collection appears somewhat contiguous with the cecum and proximal ascending colon and extends into the right iliacus and distal psoas muscle measuring approximately 9 x 9 cm in greatest axial dimensions and 13 cm in craniocaudal length. This is concerning for a necrotic malignancy although an infected collection or abscess is not excluded. Musculoskeletal: There is osteopenia. No acute osseous pathology. Severe cachexia. IMPRESSION: 1. Masslike thickening and irregularity of the cecum and proximal ascending colon most concerning for malignancy. Further evaluation with colonoscopy is recommended. 2. Multilobulated low attenuating mass or collection appears somewhat contiguous with the cecum and proximal ascending colon and extends into  the right iliacus and distal psoas muscle. This is concerning for a necrotic malignancy although  an infected collection or abscess is not excluded. 3. 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 4. Aortic Atherosclerosis (ICD10-I70.0). Electronically Signed   By: Anner Crete M.D.   On: 06/06/2021 03:33   DG CHEST PORT 1 VIEW  Result Date: 06/07/2021 CLINICAL DATA:  Sepsis EXAM: PORTABLE CHEST 1 VIEW COMPARISON:  09/17/2019 FINDINGS: Cardiac and mediastinal contours are within normal limits. No focal pulmonary opacity. No pleural effusion or pneumothorax. No acute osseous abnormality. IMPRESSION: No acute cardiopulmonary process. Electronically Signed   By: Merilyn Baba M.D.   On: 06/07/2021 01:40   DG Abdomen Acute W/Chest  Result Date: 06/06/2021 CLINICAL DATA:  Right lower quadrant abdominal pain. Fatigue. Weakness. History of perforated gastric ulcer. EXAM: DG ABDOMEN ACUTE WITH 1 VIEW CHEST COMPARISON:  Abdominopelvic CT and chest radiograph 09/17/2019 FINDINGS: Normal heart size. Unchanged mediastinal contours. No acute airspace disease. No pleural effusion or pneumothorax. No pulmonary edema. No free intra-abdominal air. No bowel dilatation to suggest obstruction. There is air scattered throughout nondilated small and large bowel. No visualized radiopaque calculi. The bones are under mineralized without acute osseous abnormality. IMPRESSION: 1. No bowel obstruction or free air. 2. No acute intrathoracic process. Electronically Signed   By: Keith Rake M.D.   On: 06/06/2021 00:39   CT IMAGE GUIDED FLUID DRAIN BY CATHETER  Result Date: 06/07/2021 INDICATION: 80 year old gentleman with right lower quadrant rim enhancing fluid collection presents to IR for CT-guided abscess drain placement EXAM: CT-guided abscess drain placement MEDICATIONS: The patient is currently admitted to the hospital and receiving intravenous antibiotics. The antibiotics were administered within an appropriate time frame prior to the initiation of the procedure.  ANESTHESIA/SEDATION: Fentanyl 75 mcg IV; Versed 1.5 mg IV Moderate Sedation Time:  16 minutes The patient was continuously monitored during the procedure by the interventional radiology nurse under my direct supervision. COMPLICATIONS: None immediate. PROCEDURE: Informed written consent was obtained from the patient after a thorough discussion of the procedural risks, benefits and alternatives. All questions were addressed. Maximal Sterile Barrier Technique was utilized including caps, mask, sterile gowns, sterile gloves, sterile drape, hand hygiene and skin antiseptic. A timeout was performed prior to the initiation of the procedure. Patient positioned supine on the procedure table. The right lower quadrant skin prepped and draped in usual fashion. Following local lidocaine administration, 18 gauge needle was advanced into the right lower quadrant abscess utilizing CT guidance. 18 gauge needle exchanged for 10 French drain over 0.035 inch guidewire. Drain secured to skin with suture and connected to bulb suction. 30 mL of purulent material was aspirated. Sample sent for Gram stain and culture. IMPRESSION: CT guidance utilized to place 10 [place] multipurpose pigtail drain in right lower quadrant abscess. Electronically Signed   By: Miachel Roux M.D.   On: 06/07/2021 14:47    Labs:  CBC: Recent Labs    06/06/21 1200 06/07/21 0042 06/08/21 0500 06/09/21 0230  WBC 27.2* 30.0* 22.5* 16.7*  HGB 9.4* 8.8* 8.2* 8.4*  HCT 29.8* 27.6* 26.1* 27.8*  PLT 456* 456* 428* 429*    COAGS: Recent Labs    10/19/20 1350  INR 1.1  APTT 38*    BMP: Recent Labs    06/06/21 1200 06/07/21 0042 06/08/21 0500 06/09/21 0230  NA 136 135 136 138  K 3.5 3.2* 3.6 3.8  CL 99 100 101 105  CO2 26 26 27  26  GLUCOSE 175* 117* 119* 110*  BUN 12 9 12 11   CALCIUM 8.4* 8.1* 8.0* 7.9*  CREATININE 0.86 0.72 0.70 0.84  GFRNONAA >60 >60 >60 >60    LIVER FUNCTION TESTS: Recent Labs    06/06/21 1200 06/07/21 0042  06/08/21 0500 06/09/21 0230  BILITOT 0.8 0.7 0.4 0.4  AST 44* 45* 78* 123*  ALT 28 28 42 65*  ALKPHOS 95 106 89 91  PROT 6.0* 5.7* 5.4* 5.4*  ALBUMIN 1.9* 1.8* 1.6* 1.5*    Assessment and Plan:  RLQ abscess Drain intact Will follow   Electronically Signed: Lavonia Drafts, PA-C 06/09/2021, 10:58 AM   I spent a total of 15 Minutes at the the patient's bedside AND on the patient's hospital floor or unit, greater than 50% of which was counseling/coordinating care for RLQ abscess drain

## 2021-06-09 NOTE — Progress Notes (Signed)
Occupational Therapy Treatment Patient Details Name: Albert Love MRN: [id number] DOB: 10-16-1940 Today's Date: 06/09/2021   History of present illness 80 y.o. male presenting to ED 10/22 with 3 week Hx of worsening bowel pain, weakness and fatigue. 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   OT comments  OT treatment session with focus on patient's desired outcomes. Patient reports wanting to brush his teeth and reapply poligrip. Patient assisted from supine to EOB with heavy Mod A at BLE and trunk 2/2 pain in R hip at drain site. Seated EOB patient able to complete 2/3 grooming tasks with set-up to Min A. Maintained static sitting balance at EOB for >10 min during task with close supervision A. Patient declined transfer to recliner. Throughout session, patient joking and laughing with this Probation officer. He reports having accepted his diagnosis/prognosis and wishes to transition peacefully. Patient encouraged to participate with therapy efforts to maximize independence and to complete desired tasks. Patient expressed verbal understanding but states "sometimes the effort is just to much". Does report satisfaction with being able to complete grooming tasks seated EOB. Patient would benefit from continued acute OT services in prep for safe d/c to next level of care. OT will continue to follow acutely.    Recommendations for follow up therapy are one component of a multi-disciplinary discharge planning process, led by the attending physician.  Recommendations may be updated based on patient status, additional functional criteria and insurance authorization.    Follow Up Recommendations  Acute inpatient rehab (3hours/day)    Assistance Recommended at Discharge  Frequent or constant Supervision/Assistance  Equipment Recommendations  BSC;Other (comment) (rolling walker)    Recommendations for Other Services Rehab consult    Precautions / Restrictions Precautions Precautions: Fall Restrictions Weight Bearing Restrictions: No       Mobility Bed Mobility Overal bed mobility: Needs Assistance Bed Mobility: Sidelying to Sit;Sit to Sidelying;Rolling Rolling: Min assist Sidelying to sit: Mod assist     Sit to sidelying: Min assist General bed mobility comments: Min to Mod A for all parts of bed mobility with increased time/effort secondary to pain in R hip.    Transfers Overall transfer level: Needs assistance                 General transfer comment: Declined standing with preference to compelete lateral scoots up toward HOB. Completed with with close supervision A. Declined transfer to recliner.     Balance Overall balance assessment: Needs assistance Sitting-balance support: Bilateral upper extremity supported;Feet supported Sitting balance-Leahy Scale: Fair Sitting balance - Comments: Maintained static sitting balance at EOB for grooming tasks. No external assist requried.                                   ADL either performed or assessed with clinical judgement   ADL Overall ADL's : Needs assistance/impaired Eating/Feeding: Minimal assistance;Sitting Eating/Feeding Details (indicate cue type and reason): Difficulty bringing ice chips to mouth with spoon. Bilateral UE tremors noted. Grooming: Dance movement psychotherapist;Wash/dry hands;Minimal assistance Grooming Details (indicate cue type and reason): Completed 2/3 grooming tasks seated EOB with Min A. Upper Body Bathing: Minimal assistance   Lower Body Bathing: Moderate assistance   Upper Body Dressing : Minimal assistance   Lower Body Dressing:  Moderate assistance Lower Body Dressing Details (indicate cue type and reason): Max A to don footwear at bedlevel. Toilet  Transfer: Minimal assistance;Rolling walker (2 wheels) Toilet Transfer Details (indicate cue type and reason): Simualted with lateral scoots toward HOB with RW.                 Vision   Vision Assessment?: Vision impaired- to be further tested in functional context   Perception     Praxis      Cognition Arousal/Alertness: Awake/alert;Lethargic (Mild lethargy from procedure earlier this date.) Behavior During Therapy: [facility] for tasks assessed/performed Overall Cognitive Status: Within Functional Limits for tasks assessed                                 General Comments: Patient reports accepting diagnosis/prognosis stating "I'm ready to transition".          Exercises     Shoulder Instructions       General Comments      Pertinent Vitals/ Pain       Pain Assessment: Faces Faces Pain Scale: Hurts even more Pain Location: R hip/leg with movement (at drain) Pain Descriptors / Indicators: Aching;Sore Pain Intervention(s): Limited activity within patient's tolerance;Monitored during session;Repositioned  Home Living                                          Prior Functioning/Environment              Frequency  Min 2X/week        Progress Toward Goals  OT Goals(current goals can now be found in the care plan section)  Progress towards OT goals: Progressing toward goals  Acute Rehab OT Goals Patient Stated Goal: To transition peacefully OT Goal Formulation: With patient Time For Goal Achievement: 06/21/21 Potential to Achieve Goals: Good ADL Goals Pt Will Perform Eating: Independently;sitting Pt Will Perform Grooming: with modified independence;standing Pt Will Perform Upper Body Dressing: with modified independence;standing Pt Will Perform Lower Body Dressing: with modified independence;sit to/from stand Pt Will Transfer to Toilet: with modified independence;regular height toilet;ambulating Pt Will Perform Toileting -  Clothing Manipulation and hygiene: with modified independence;sit to/from stand Pt Will Perform Tub/Shower Transfer: Shower transfer;grab bars;rolling walker Additional ADL Goal #1: Patient will tolerate static sitting at EOB for 8-10 minutes in prep for ADLs reporting pain <5/10 at R hip.  Plan Discharge plan needs to be updated;Frequency remains appropriate    Co-evaluation                 AM-PAC OT "6 Clicks" Daily Activity     Outcome Measure   Help from another person eating meals?: A Little Help from another person taking care of personal grooming?: A Little Help from another person toileting, which includes using toliet, bedpan, or urinal?: A Lot Help from another person bathing (including washing, rinsing, drying)?: A Lot Help from another person to put on and taking off regular upper body clothing?: A Little Help from another person to put on and taking off regular lower body clothing?: A Lot 6 Click Score: 15    End of Session Equipment Utilized During Treatment: Gait belt  OT Visit Diagnosis: Unsteadiness on feet (R26.81);Other abnormalities of gait and mobility (R26.89);Muscle weakness (generalized) (M62.81);Pain Pain - Right/Left: Right Pain - part of body: Hip;Leg  Activity Tolerance Patient limited by pain   Patient Left in bed;with call bell/phone within reach;with bed alarm set   Nurse Communication Mobility status        Time: 6415-8309 OT Time Calculation (min): 37 min  Charges: OT General Charges $OT Visit: 1 Visit OT Treatments $Self Care/Home Management : 23-37 mins  Jaquelinne Glendening H. OTR/L Supplemental OT, Department of rehab services [phone]  Jay Haskew R H. 06/09/2021, 1:27 PM

## 2021-06-09 NOTE — Progress Notes (Signed)
This chaplain responded to PMT consult for creating/updating the Pt. Advance Directive.    The Pt. briefly responded to the call of his name and was unable to stay awake as the chaplain introduced herself.  The chaplain will plan a revisit.  Chaplain Sallyanne Kuster [phone]

## 2021-06-10 ENCOUNTER — Encounter: Payer: Medicare Other | Admitting: Internal Medicine

## 2021-06-10 DIAGNOSIS — L0291 Cutaneous abscess, unspecified: Secondary | ICD-10-CM

## 2021-06-10 DIAGNOSIS — K6389 Other specified diseases of intestine: Secondary | ICD-10-CM | POA: Diagnosis not present

## 2021-06-10 LAB — COMPREHENSIVE METABOLIC PANEL
ALT: 61 U/L — ABNORMAL HIGH (ref 0–44)
AST: 92 U/L — ABNORMAL HIGH (ref 15–41)
Albumin: 1.6 g/dL — ABNORMAL LOW (ref 3.5–5.0)
Alkaline Phosphatase: 78 U/L (ref 38–126)
Anion gap: 6 (ref 5–15)
BUN: 10 mg/dL (ref 8–23)
CO2: 26 mmol/L (ref 22–32)
Calcium: 7.8 mg/dL — ABNORMAL LOW (ref 8.9–10.3)
Chloride: 102 mmol/L (ref 98–111)
Creatinine, Ser: 0.69 mg/dL (ref 0.61–1.24)
GFR, Estimated: >60 mL/min (ref >60)
Glucose, Bld: 126 mg/dL — ABNORMAL HIGH (ref 70–99)
Potassium: 4 mmol/L (ref 3.5–5.1)
Sodium: 134 mmol/L — ABNORMAL LOW (ref 135–145)
Total Bilirubin: 0.1 mg/dL — ABNORMAL LOW (ref 0.3–1.2)
Total Protein: 5.2 g/dL — ABNORMAL LOW (ref 6.5–8.1)

## 2021-06-10 LAB — CBC
HCT: 28.8 % — ABNORMAL LOW (ref 39.0–52.0)
Hemoglobin: 9 g/dL — ABNORMAL LOW (ref 13.0–17.0)
MCH: 20.7 pg — ABNORMAL LOW (ref 26.0–34.0)
MCHC: 31.3 g/dL (ref 30.0–36.0)
MCV: 66.4 fL — ABNORMAL LOW (ref 80.0–100.0)
Platelets: 413 10*3/uL — ABNORMAL HIGH (ref 150–400)
RBC: 4.34 MIL/uL (ref 4.22–5.81)
RDW: 15.7 % — ABNORMAL HIGH (ref 11.5–15.5)
WBC: 11.7 10*3/uL — ABNORMAL HIGH (ref 4.0–10.5)
nRBC: 0 % (ref 0.0–0.2)

## 2021-06-10 LAB — GLUCOSE, CAPILLARY
Glucose-Capillary: 115 mg/dL — ABNORMAL HIGH (ref 70–99)
Glucose-Capillary: 107 mg/dL — ABNORMAL HIGH (ref 70–99)
Glucose-Capillary: 103 mg/dL — ABNORMAL HIGH (ref 70–99)

## 2021-06-10 MED ORDER — AMOXICILLIN-POT CLAVULANATE 875-125 MG PO TABS
1.0000 | ORAL_TABLET | Freq: Two times a day (BID) | ORAL | Status: AC
  Administered 2021-06-10 – 2021-06-14 (×9): 1 via ORAL
  Filled 2021-06-10 (×9): qty 1

## 2021-06-10 MED ORDER — MORPHINE SULFATE (PF) 2 MG/ML IV SOLN
2.0000 mg | Freq: Once | INTRAVENOUS | Status: AC
  Administered 2021-06-10: 2 mg via INTRAVENOUS
  Filled 2021-06-10: qty 1

## 2021-06-10 MED ORDER — OXYCODONE HCL 5 MG PO TABS
5.0000 mg | ORAL_TABLET | ORAL | Status: DC | PRN
  Administered 2021-06-10 – 2021-06-11 (×2): 10 mg via ORAL
  Filled 2021-06-10 (×2): qty 2

## 2021-06-10 MED ORDER — SODIUM CHLORIDE 0.9% FLUSH
5.0000 mL | Freq: Three times a day (TID) | INTRAVENOUS | Status: DC
  Administered 2021-06-10 – 2021-06-15 (×14): 5 mL

## 2021-06-10 MED ORDER — OXYCODONE HCL 5 MG PO TABS
10.0000 mg | ORAL_TABLET | ORAL | Status: DC | PRN
Start: 2021-06-10 — End: 2021-06-10

## 2021-06-10 MED ORDER — MORPHINE SULFATE (PF) 4 MG/ML IV SOLN: 4.0000 mg | INTRAVENOUS | Status: DC | PRN

## 2021-06-10 MED ORDER — OXYCODONE HCL 5 MG PO TABS
5.0000 mg | ORAL_TABLET | ORAL | Status: DC | PRN
  Administered 2021-06-10: 5 mg via ORAL
  Filled 2021-06-10: qty 1

## 2021-06-10 MED ORDER — ONDANSETRON HCL 4 MG PO TABS
4.0000 mg | ORAL_TABLET | Freq: Once | ORAL | Status: AC
  Administered 2021-06-10: 4 mg via ORAL
  Filled 2021-06-10: qty 1

## 2021-06-10 MED ORDER — POLYETHYLENE GLYCOL 3350 17 G PO PACK
17.0000 g | PACK | Freq: Two times a day (BID) | ORAL | Status: DC
  Administered 2021-06-10 – 2021-06-11 (×3): 17 g via ORAL
  Filled 2021-06-10 (×3): qty 1

## 2021-06-10 NOTE — Care Management Important Message (Signed)
Important Message  Patient Details  Name: Albert Love MRN: [id number] Date of Birth: [birth year]   Medicare Important Message Given:  Yes     [name] 06/10/2021, 2:46 PM

## 2021-06-10 NOTE — Progress Notes (Signed)
FPTS Brief Progress Note  S:RN paged as pt having abdominal pain   O: BP 139/89 (BP Location: Left Arm)   Pulse (!) 119   Temp (!) 97.4 F (36.3 C) (Oral)   Resp 20   Ht 5\' 11"  (1.803 m)   Wt 61.7 kg   SpO2 96%   BMI 18.97 kg/m     A/P: Tachycardic in the setting of acute pain. No pain medication given since ~1430. Given 2 mg morphine and adjusted pain regimen 5 (moderate) and 10 mg (severe) q4h PRN.  EKG obtained QTc 444. Zophran x1 given. Consider Scopolamine patch.  - Orders reviewed. Labs for AM ordered, which was adjusted as needed.    Lyndee Hensen, DO 06/10/2021, 11:48 PM PGY-3, [facility] Night Resident  Please page [phone] with questions.

## 2021-06-10 NOTE — Progress Notes (Signed)
This chaplain is present for F/U spiritual care on the PMT consult for completing the Pt. Advance Directive:  HCPOA.  The Pt. is awake and willing to participate in AD education.  The Pt. is naming his Bethanie Dicker as his healthcare agent.  The chaplain is present with the notary and two witnesses for the notarizing of the Pt. HCPOA.    The chaplain returned the original AD and one copy of the Pt. AD to the Pt.  A copy of the AD:  HCPOA was scanned into the Pt. EMR.  This chaplain is available for F/U spiritual care as needed.  Chaplain Sallyanne Kuster [phone]

## 2021-06-10 NOTE — Progress Notes (Signed)
Family Medicine Teaching Service Daily Progress Note Intern Pager: [phone]  Patient name: Albert Love record number: [id number] Date of birth: 1940/09/24 Age: 80 y.o. 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 PMH significant for peptic ulcer s/p [name] patch, H. Pylori, CRAO, prior intra-abdominal abscess with drains.   Sepsis (resolved) in setting of cecal mass/abscess s/p RLQ abscess drain placement (10/24) Patient maintains afebrile status, leukocytosis continues to downtrend from 16.7 to 11.7 this morning. Zosyn discontinued 10/27 given cultures returned with abundant streptococcus constellatus with moderate haemophilus parainfluenzae. Continued ceftriaxone. General surgery recommending antibiotics for 1 week outpatient. IR drain in place with continued drainage, will follow up outpatient with IR in 10-14 days for repeat imaging/possible drain injection. Noted to have abdominal pain overnight thought to be secondary to cecal mass.  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 No episodes of active bleeding reported. - Monitor with CBC   Goals of Care Patient remains DNR code status. 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  - regular diet  - Monitor  PO intake - Ensure Alive TID - Magic Cup TID with meals - Assistance with meals - MV - Assistance with eating order placed   FEN/GI: regular  PPx: SCDs Dispo: pending further workup, on IV antibiotics   pending clinical improvement . Barriers include IV antibiotics, need to narrow to oral coverage, awaiting susceptibilities.   Subjective:  Overnight noted to have abdominal pain, likely secondary to colonic mass. Given morphine which he states significantly helped his pain. Denies any pain today, denies any other concerns.   Objective: Temp:  [97.7 F (36.5 C)-98.9 F (37.2 C)] 98 F (36.7 C) (10/27 0557) Pulse Rate:  [69-99] 99 (10/27 0557) Resp:  [18-20] 20 (10/27 0557) BP: (115-147)/(53-95) 147/95 (10/27 0557) SpO2:  [95 %-98 %] 98 % (10/27 0557) Physical Exam: General: Patient resting comfortably in bed, in no acute distress. Cardiovascular: RRR, no murmurs or gallops auscultated  Respiratory: CTAB Abdomen: soft, mild tenderness along deep palpation below umbilicus, presence of bowel sounds, no rebound tenderness or guarding  Extremities: radial and distal pulses strong and equal bilaterally, no LE edema noted bilaterally Psych: mood appropriate, very pleasant   Laboratory: Recent Labs  Lab 06/08/21 0500 06/09/21 0230 06/10/21 0140  WBC 22.5* 16.7* 11.7*  HGB 8.2* 8.4* 9.0*  HCT 26.1* 27.8* 28.8*  PLT 428* 429* 413*   Recent Labs  Lab 06/08/21 0500 06/09/21 0230 06/10/21 0140  NA 136 138 134*  K 3.6 3.8 4.0  CL 101 105 102  CO2 27 26 26   BUN 12 11 10   CREATININE 0.70 0.84 0.69  CALCIUM 8.0* 7.9* 7.8*  PROT 5.4* 5.4* 5.2*  BILITOT 0.4 0.4 0.1*  ALKPHOS 89 91 78  ALT 42 65* 61*  AST 78* 123* 92*  GLUCOSE 119* 110* 126*      Imaging/Diagnostic Tests: No results found.   Donney Dice, DO 06/10/2021, 6:50 AM PGY-2, [place] Intern pager: [phone], text pages welcome

## 2021-06-10 NOTE — Progress Notes (Signed)
Referring Physician(s): Dr Lyndel Safe  Supervising Physician: Ruthann Cancer  Patient Status:  [facility] - In-pt  Chief Complaint:  F/U drain  Brief History:  Albert Love is a 80 y.o. male with history of gastric ulcer surgery 09/05/19 who presented to the ED on 06/05/21 with abdominal pain and weight loss. Wt loss   CT showed: 1. Masslike thickening and irregularity of the cecum and proximal ascending colon most concerning for malignancy. Further evaluation with colonoscopy is recommended. 2. Multilobulated low attenuating mass or collection appears somewhat contiguous with the cecum and proximal ascending colon and extends into the right iliacus and distal psoas muscle. This is concerning for a necrotic malignancy although an infected collection or abscess is not excluded.  He underwent placement of a drain by Dr. Dwaine Gale on 06/07/21  Subjective:  More awake today. States he is hungry.  Allergies: Patient has no known allergies.  Medications: Prior to Admission medications   Medication Sig Start Date End Date Taking? Authorizing Provider  aspirin 81 MG chewable tablet Chew 1 tablet (81 mg total) by mouth daily. 01/21/21  Yes Gladys Damme, MD  atorvastatin (LIPITOR) 80 MG tablet TAKE 1 TABLET (80 MG TOTAL) BY MOUTH DAILY. Patient taking differently: Take 80 mg by mouth daily. 01/21/21 01/21/22 Yes Gladys Damme, MD  ferrous sulfate 325 (65 FE) MG EC tablet Take 1 tablet (325 mg total) by mouth daily with breakfast. 05/12/21  Yes Gatha Mayer, MD     Vital Signs: BP (!) 168/90 (BP Location: Left Arm)   Pulse (!) 110   Temp 97.6 F (36.4 C) (Oral)   Resp 18   Ht 5\' 11"  (1.803 m)   Wt 61.7 kg   SpO2 94%   BMI 18.97 kg/m   Physical Exam Constitutional:      Appearance: Normal appearance.  HENT:     Head: Normocephalic and atraumatic.  Cardiovascular:     Rate and Rhythm: Tachycardia present.  Pulmonary:     Effort: Pulmonary effort is normal. No respiratory  distress.  Abdominal:     Palpations: Abdomen is soft.     Tenderness: There is no abdominal tenderness.  Skin:    General: Skin is warm and dry.  Neurological:     General: No focal deficit present.     Mental Status: He is alert and oriented to person, place, and time.  Drain Location: RLQ Size: Fr size: 10 Fr Date of placement: 06/07/21 Currently to: Drain collection device: gravity 24 hour output:  Output by Drain (mL) 06/08/21 0701 - 06/08/21 1900 06/08/21 1901 - 06/09/21 0700 06/09/21 0701 - 06/09/21 1900 06/09/21 1901 - 06/10/21 0700 06/10/21 0701 - 06/10/21 1323  Closed System Drain 1 RLQ Bulb (JP) 10 Fr. 15 25 75 50    Current examination: Flushes/aspirates easily.  Insertion site unremarkable. Suture and stat lock in place. Dressed appropriately.    Imaging: DG CHEST PORT 1 VIEW  Result Date: 06/07/2021 CLINICAL DATA:  Sepsis EXAM: PORTABLE CHEST 1 VIEW COMPARISON:  09/17/2019 FINDINGS: Cardiac and mediastinal contours are within normal limits. No focal pulmonary opacity. No pleural effusion or pneumothorax. No acute osseous abnormality. IMPRESSION: No acute cardiopulmonary process. Electronically Signed   By: Merilyn Baba M.D.   On: 06/07/2021 01:40   CT IMAGE GUIDED FLUID DRAIN BY CATHETER  Result Date: 06/07/2021 INDICATION: 80 year old gentleman with right lower quadrant rim enhancing fluid collection presents to IR for CT-guided abscess drain placement EXAM: CT-guided abscess drain placement MEDICATIONS: The patient is  currently admitted to the hospital and receiving intravenous antibiotics. The antibiotics were administered within an appropriate time frame prior to the initiation of the procedure. ANESTHESIA/SEDATION: Fentanyl 75 mcg IV; Versed 1.5 mg IV Moderate Sedation Time:  16 minutes The patient was continuously monitored during the procedure by the interventional radiology nurse under my direct supervision. COMPLICATIONS: None immediate. PROCEDURE: Informed  written consent was obtained from the patient after a thorough discussion of the procedural risks, benefits and alternatives. All questions were addressed. Maximal Sterile Barrier Technique was utilized including caps, mask, sterile gowns, sterile gloves, sterile drape, hand hygiene and skin antiseptic. A timeout was performed prior to the initiation of the procedure. Patient positioned supine on the procedure table. The right lower quadrant skin prepped and draped in usual fashion. Following local lidocaine administration, 18 gauge needle was advanced into the right lower quadrant abscess utilizing CT guidance. 18 gauge needle exchanged for 10 French drain over 0.035 inch guidewire. Drain secured to skin with suture and connected to bulb suction. 30 mL of purulent material was aspirated. Sample sent for Gram stain and culture. IMPRESSION: CT guidance utilized to place 10 [place] multipurpose pigtail drain in right lower quadrant abscess. Electronically Signed   By: Miachel Roux M.D.   On: 06/07/2021 14:47    Labs:  CBC: Recent Labs    06/07/21 0042 06/08/21 0500 06/09/21 0230 06/10/21 0140  WBC 30.0* 22.5* 16.7* 11.7*  HGB 8.8* 8.2* 8.4* 9.0*  HCT 27.6* 26.1* 27.8* 28.8*  PLT 456* 428* 429* 413*    COAGS: Recent Labs    10/19/20 1350  INR 1.1  APTT 38*    BMP: Recent Labs    06/07/21 0042 06/08/21 0500 06/09/21 0230 06/10/21 0140  NA 135 136 138 134*  K 3.2* 3.6 3.8 4.0  CL 100 101 105 102  CO2 26 27 26 26   GLUCOSE 117* 119* 110* 126*  BUN 9 12 11 10   CALCIUM 8.1* 8.0* 7.9* 7.8*  CREATININE 0.72 0.70 0.84 0.69  GFRNONAA >60 >60 >60 >60    LIVER FUNCTION TESTS: Recent Labs    06/07/21 0042 06/08/21 0500 06/09/21 0230 06/10/21 0140  BILITOT 0.7 0.4 0.4 0.1*  AST 45* 78* 123* 92*  ALT 28 42 65* 61*  ALKPHOS 106 89 91 78  PROT 5.7* 5.4* 5.4* 5.2*  ALBUMIN 1.8* 1.6* 1.5* 1.6*    Assessment and Plan:  S/P CT guided RLQ abscess drain placement by Dr. Dwaine Gale on  06/07/21  Plan: Continue TID flushes with 5 cc NS. Record output Q shift. Dressing changes QD or PRN if soiled.  Call IR APP or on call IR MD if difficulty flushing or sudden change in drain output.  Repeat imaging/possible drain injection once output < 10 mL/QD (excluding flush material.)  Discharge planning: Please contact IR APP or on call IR MD prior to patient d/c to ensure appropriate follow up plans are in place. Typically patient will follow up with IR clinic 10-14 days post d/c for repeat imaging/possible drain injection. IR scheduler will contact patient with date/time of appointment. Patient will need to flush drain QD with 5 cc NS, record output QD, dressing changes every 2-3 days or earlier if soiled.   IR will continue to follow - please call with questions or concerns.  Electronically Signed: Murrell Redden, PA-C 06/10/2021, 1:13 PM    I spent a total of 15 Minutes at the the patient's bedside AND on the patient's hospital floor or unit, greater than 50%  of which was counseling/coordinating care for f/u drain.

## 2021-06-10 NOTE — Progress Notes (Signed)
PT Cancellation/Discharge Note  Patient Details Name: Albert Love MRN: [id number] DOB: 06-May-1941   Cancelled Treatment:    Reason Eval/Treat Not Completed: PT screened, no needs identified, will sign off  Spoke with Erin from Palliative Care re: ? Discharge plan (home with Hospice vs SNF with Hospice vs ?residential hospice) and currently plan is not known. Patient is not interested in PT as he does not feel strong enough to walk or to even use BSC. Nursing reports he has not been OOB to chair or BSC for them either.   Will sign off at this time as pt has no desire to work on functional mobility at this time. If his situation changes, PT would be happy to assess this kind gentleman.    Arby Barrette, PT Acute Rehabilitation Services  Pager [phone] Office [phone]   Rexanne Mano 06/10/2021, 11:11 AM

## 2021-06-11 DIAGNOSIS — K6389 Other specified diseases of intestine: Secondary | ICD-10-CM | POA: Diagnosis not present

## 2021-06-11 LAB — CBC
HCT: 35.1 % — ABNORMAL LOW (ref 39.0–52.0)
Hemoglobin: 10.8 g/dL — ABNORMAL LOW (ref 13.0–17.0)
MCH: 20.7 pg — ABNORMAL LOW (ref 26.0–34.0)
MCHC: 30.8 g/dL (ref 30.0–36.0)
MCV: 67.1 fL — ABNORMAL LOW (ref 80.0–100.0)
Platelets: 561 10*3/uL — ABNORMAL HIGH (ref 150–400)
RBC: 5.23 MIL/uL (ref 4.22–5.81)
RDW: 15.9 % — ABNORMAL HIGH (ref 11.5–15.5)
WBC: 16.3 10*3/uL — ABNORMAL HIGH (ref 4.0–10.5)
nRBC: 0 % (ref 0.0–0.2)

## 2021-06-11 LAB — CULTURE, BLOOD (ROUTINE X 2)
Culture: NO GROWTH
Culture: NO GROWTH
Special Requests: ADEQUATE
Special Requests: ADEQUATE

## 2021-06-11 LAB — COMPREHENSIVE METABOLIC PANEL
ALT: 50 U/L — ABNORMAL HIGH (ref 0–44)
AST: 45 U/L — ABNORMAL HIGH (ref 15–41)
Albumin: 1.9 g/dL — ABNORMAL LOW (ref 3.5–5.0)
Alkaline Phosphatase: 93 U/L (ref 38–126)
Anion gap: 11 (ref 5–15)
BUN: 11 mg/dL (ref 8–23)
CO2: 26 mmol/L (ref 22–32)
Calcium: 8.7 mg/dL — ABNORMAL LOW (ref 8.9–10.3)
Chloride: 99 mmol/L (ref 98–111)
Creatinine, Ser: 0.87 mg/dL (ref 0.61–1.24)
GFR, Estimated: >60 mL/min (ref >60)
Glucose, Bld: 108 mg/dL — ABNORMAL HIGH (ref 70–99)
Potassium: 5 mmol/L (ref 3.5–5.1)
Sodium: 136 mmol/L (ref 135–145)
Total Bilirubin: 0.6 mg/dL (ref 0.3–1.2)
Total Protein: 6 g/dL — ABNORMAL LOW (ref 6.5–8.1)

## 2021-06-11 LAB — AEROBIC/ANAEROBIC CULTURE W GRAM STAIN (SURGICAL/DEEP WOUND)

## 2021-06-11 MED ORDER — OXYCODONE HCL 5 MG PO TABS
10.0000 mg | ORAL_TABLET | Freq: Four times a day (QID) | ORAL | Status: DC
Start: 2021-06-11 — End: 2021-06-16
  Administered 2021-06-11 – 2021-06-15 (×16): 10 mg via ORAL
  Filled 2021-06-11 (×17): qty 2

## 2021-06-11 MED ORDER — METOCLOPRAMIDE HCL 10 MG PO TABS
10.0000 mg | ORAL_TABLET | Freq: Three times a day (TID) | ORAL | Status: DC
  Administered 2021-06-11 – 2021-06-15 (×14): 10 mg via ORAL
  Filled 2021-06-11 (×15): qty 1

## 2021-06-11 MED ORDER — SENNA 8.6 MG PO TABS
1.0000 | ORAL_TABLET | Freq: Two times a day (BID) | ORAL | Status: DC
  Administered 2021-06-11 – 2021-06-14 (×7): 8.6 mg via ORAL
  Filled 2021-06-11 (×7): qty 1

## 2021-06-11 MED ORDER — OXYCODONE HCL 5 MG PO TABS: 10.0000 mg | ORAL_TABLET | Freq: Four times a day (QID) | ORAL | Status: DC

## 2021-06-11 NOTE — Progress Notes (Signed)
FMTS Brief Note Attempted to contact nephew, [name] about home hospice.  Team will continue to try to reach out.  Dorris Singh, MD  Family Medicine Teaching Service

## 2021-06-11 NOTE — Progress Notes (Signed)
Family Medicine Teaching Service Daily Progress Note Intern Pager: [phone]  Patient name: Albert Love record number: [id number] Date of birth: October 14, 1940 Age: 80 y.o. Gender: male  Primary Care Provider: Gladys Damme, MD Consultants: General surgery, gastroenterology, palliative care Code Status: DNR  Pt Overview and Major Events to Date:  06/06/2021: Admitted 06/07/2021: CT-guided RLQ abscess drain placed 06/08/2021: CODE STATUS changed to DNR  Assessment and Plan: Albert Love is a 80 y.o. male with history of peptic ulcer s/p [name] patch, H. 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  Leukocytosis is trending flat (WBC 16.2<16.3).  Continues to deny pain.  Tachycardia has resolved however must remain vigilant for signs of infection.  Per patient, pain is well controlled. RN reported concern over non-sterile flush procedure that goes directly into the abdomen. Reached out to IR team who saw patient last.  -Continue Augmentin twice daily (10/27 -) -Pain control: Oxycodone IR 10 mg every 6 hours -RLQ drain placed by IR 06/07/21: Daily drain flush with 5 mL NS, dressing changes every 2 to 3 days or earlier if needed -Patient to follow-up with IR 10 to 14 days after discharge  Iron deficiency anemia Hemoglobin dropped this morning to 9.7  from 10.8 yesterday.  -Monitor with AM CBC  Goals of care  DNR  FPS team and social worker tried contacting patient's nephew, Albert Love, however to no avail Pt states his family in [place] and [place] have called to check up on him. Patient does not want to go home with hospice. He prefers to go to a SNF.  -Palliative care following, appreciate recommendations -Case management and social work following, appreciate help with disposition  Severe protein calorie  malnutrition  Cachexia  Very little p.o. intake documented.  -Regular diet -Ensure 3 times daily -Magic cup 3 times daily -Assistance with meals   Hiccups Thorazine discontinued yesterday.  Patient reports improvement with Reglan.  Hiccups returned overnight.  Concomitant constipation be inducing assess pressure on his diaphragm causing more hiccups. -Continue Reglan 3 times daily   Constipation None since admission.  Possibly due very little po intake vs opioid use.  -Continue senna twice daily  -Increase MiraLAX to 2 capfuls (34g) twice daily. -Obtain KUB  FEN/GI: Regular diet PPx: SCDs  Disposition: Hospice likely home hospice  Subjective:  Hiccups resolved with Reglan. Patient would like to go to SNF with hospice.   Objective: Temp:  [97.4 F (36.3 C)-98.2 F (36.8 C)] 97.9 F (36.6 C) (10/29 0400) Pulse Rate:  [87-100] 90 (10/29 0400) Resp:  [17-20] 18 (10/29 0400) BP: (132-156)/(84-91) 143/86 (10/29 0400) SpO2:  [96 %-98 %] 96 % (10/29 0400)  Physical Exam: General: pleasant elderly male in no acute distress  Cardiovascular: regular rate and rhythm Respiratory: clear to ascultation bilaterally Abdomen: flat, non tender, bowel sounds active  Extremities: thin, no LE edema   Laboratory: Recent Labs  Lab 06/10/21 0140 06/11/21 0103 06/12/21 0202  WBC 11.7* 16.3* 16.2*  HGB 9.0* 10.8* 9.7*  HCT 28.8* 35.1* 31.5*  PLT 413* 561* 530*   Recent Labs  Lab 06/10/21 0140 06/11/21 0103 06/12/21 0202  NA 134* 136 135  K 4.0 5.0 5.0  CL 102 99 100  CO2 26 26 29   BUN 10 11 12   CREATININE 0.69 0.87 0.79  CALCIUM 7.8* 8.7* 8.6*  PROT 5.2* 6.0* 5.8*  BILITOT 0.1* 0.6  0.6  ALKPHOS 78 93 79  ALT 61* 50* 38  AST 92* 45* 30  GLUCOSE 126* 108* 112*      Imaging/Diagnostic Tests: No results found.   Lyndee Hensen, DO  06/12/2021, 6:05 AM PGY-3, [place] Intern pager: [phone], text pages welcome

## 2021-06-11 NOTE — TOC Progression Note (Signed)
Transition of Care [facility]) - Progression Note    Patient Details  Name: Albert Love MRN: [id number] Date of Birth: 1940-08-19  Transition of Care [facility]) CM/SW [place], LCSW Phone Number: 06/11/2021, 9:07 AM  Clinical Narrative:    CSW left voicemail for patient's nephew, Albert Love, as he has not contacted [facility] back about his decision on home hospice versus residential hospice.    Expected Discharge Plan: Home w Hospice Care Barriers to Discharge: Continued Medical Work up  Expected Discharge Plan and Services Expected Discharge Plan: [place]   Discharge Planning Services: CM Consult   Living arrangements for the past 2 months: Apartment                                       Social Determinants of Health (SDOH) Interventions    Readmission Risk Interventions No flowsheet data found.

## 2021-06-11 NOTE — Progress Notes (Signed)
FPTS Brief Progress Note  S:Pt reports no pain currently. He is doing well without concerns. He is ready to go to sleep for the evening    O: BP (!) 141/85 (BP Location: Left Arm)   Pulse 88   Temp 98.2 F (36.8 C) (Oral)   Resp 19   Ht 5\' 11"  (1.803 m)   Wt 61.7 kg   SpO2 96%   BMI 18.97 kg/m    GEN: pleasant elderly male, in no acute distress  RESP: no increased work of breathing   A/P:  - Orders reviewed. Labs for AM ordered, which was adjusted as needed.    Lyndee Hensen, DO 06/11/2021, 11:22 PM PGY-3, [facility] Night Resident  Please page [phone] with questions.

## 2021-06-11 NOTE — Progress Notes (Signed)
   06/10/21 2003  Assess: MEWS Score  Temp (!) 97.5 F (36.4 C)  BP (!) 147/95  Pulse Rate (!) 120  Resp 20  Level of Consciousness Alert  SpO2 97 %  O2 Device Room Air  Assess: MEWS Score  MEWS Temp 0  MEWS Systolic 0  MEWS Pulse 2  MEWS RR 0  MEWS LOC 0  MEWS Score 2  MEWS Score Color Yellow  Assess: if the MEWS score is Yellow or Red  Were vital signs taken at a resting state? Yes  Focused Assessment No change from prior assessment  Early Detection of Sepsis Score *See Row Information* Low  MEWS guidelines implemented *See Row Information* Yes  Treat  MEWS Interventions Administered scheduled meds/treatments;Administered prn meds/treatments;Other (Comment) (Paged MD for pain medications)  Pain Scale 0-10  Pain Score 8  Pain Type Acute pain  Pain Location Abdomen  Pain Orientation Right;Left  Pain Descriptors / Indicators Sharp  Pain Frequency Intermittent  Pain Onset Other (Comment) (w/ hiccups)  Patients Stated Pain Goal 3  Pain Intervention(s) Medication (See eMAR);MD notified (Comment);Repositioned;Distraction;Emotional support;Rest  Multiple Pain Sites No  Nausea relieved by Antiemetic  Patients response to intervention Decreased  Take Vital Signs  Increase Vital Sign Frequency  Yellow: Q 2hr X 2 then Q 4hr X 2, if remains yellow, continue Q 4hrs  Escalate  MEWS: Escalate Yellow: discuss with charge nurse/RN and consider discussing with provider and RRT  Notify: Charge Nurse/RN  Name of Charge Nurse/RN Notified Corban Kistler RN (Primary RN and Agricultural consultant role tonight)  Date Charge Nurse/RN Notified 06/10/21  Time Charge Nurse/RN Notified 2003  Notify: Provider  Provider Name/Title Alvie Heidelberg MD  Date Provider Notified 06/10/21  Time Provider Notified 2003  Notification Type Page  Notification Reason Other (Comment) (Pain control needed)  Provider response See new orders  Document  Patient Outcome Other (Comment) (Stable; Pain meds given; continue vital sign  monitoring per MEWS guidlines)  Progress note created (see row info) Yes

## 2021-06-11 NOTE — Progress Notes (Signed)
Supervising Physician: Mir, Biochemist, clinical  Patient Status:  [facility] - In-pt  Chief Complaint:  right lower quadrant cecal mass/abscess s/p drain placementright lower quadrant cecal mass/abscess s/p drain placement  Brief History:  Albert Love is a 80 y.o. male with history of gastric ulcer surgery 09/05/19 who presented to the ED on 06/05/21 with abdominal pain and weight loss.   CT showed: 1. Masslike thickening and irregularity of the cecum and proximal ascending colon most concerning for malignancy. Further evaluation with colonoscopy is recommended. 2. Multilobulated low attenuating mass or collection appears somewhat contiguous with the cecum and proximal ascending colon and extends into the right iliacus and distal psoas muscle. This is concerning for a necrotic malignancy although an infected collection or abscess is not excluded.   He underwent placement of a drain by Dr. Dwaine Gale on 06/07/21  Subjective:  In good spirits, sitting up in bed eating, reporting poor appetite and pushing away as provider entered the room.  Allergies: Patient has no known allergies.  Medications: Prior to Admission medications   Medication Sig Start Date End Date Taking? Authorizing Provider  aspirin 81 MG chewable tablet Chew 1 tablet (81 mg total) by mouth daily. 01/21/21  Yes Gladys Damme, MD  atorvastatin (LIPITOR) 80 MG tablet TAKE 1 TABLET (80 MG TOTAL) BY MOUTH DAILY. Patient taking differently: Take 80 mg by mouth daily. 01/21/21 01/21/22 Yes Gladys Damme, MD  ferrous sulfate 325 (65 FE) MG EC tablet Take 1 tablet (325 mg total) by mouth daily with breakfast. 05/12/21  Yes Gatha Mayer, MD     Vital Signs: BP (!) 148/84 (BP Location: Left Arm)   Pulse 100   Temp 97.6 F (36.4 C) (Oral)   Resp 18   Ht 5\' 11"  (1.803 m)   Wt 136 lb (61.7 kg)   SpO2 98%   BMI 18.97 kg/m   Physical Exam Vitals reviewed.  Constitutional:      General: He is not in acute distress. HENT:      Head: Normocephalic and atraumatic.     Mouth/Throat:     Mouth: Mucous membranes are moist.     Pharynx: Oropharynx is clear.  Eyes:     Extraocular Movements: Extraocular movements intact.  Cardiovascular:     Rate and Rhythm: Tachycardia present.  Pulmonary:     Effort: Pulmonary effort is normal.  Abdominal:     General: Abdomen is flat.     Palpations: Abdomen is soft.  Skin:    General: Skin is warm and dry.  Neurological:     General: No focal deficit present.     Mental Status: He is alert.  Psychiatric:        Mood and Affect: Mood normal.        Thought Content: Thought content normal.   Drain Location: RLQ Size: Fr size: 10 Fr Date of placement: 06/07/21 Currently to: Drain collection device: gravity  Current examination: Flushes/aspirates easily.  Insertion site unremarkable. Suture and stat lock in place. Dressed appropriately.  Output is purulent.  Output by Drain (mL) 06/09/21 0700 - 06/09/21 1459 06/09/21 1500 - 06/09/21 2259 06/09/21 2300 - 06/10/21 0659 06/10/21 0700 - 06/10/21 1459 06/10/21 1500 - 06/10/21 2259 06/10/21 2300 - 06/11/21 0659 06/11/21 0700 - 06/11/21 1459 06/11/21 1500 - 06/11/21 1527  Closed System Drain 1 RLQ Bulb (JP) 10 Fr.  75 50   35       Imaging: No results found.  Labs:  CBC: Recent Labs  06/08/21 0500 06/09/21 0230 06/10/21 0140 06/11/21 0103  WBC 22.5* 16.7* 11.7* 16.3*  HGB 8.2* 8.4* 9.0* 10.8*  HCT 26.1* 27.8* 28.8* 35.1*  PLT 428* 429* 413* 561*    COAGS: Recent Labs    10/19/20 1350  INR 1.1  APTT 38*    BMP: Recent Labs    06/08/21 0500 06/09/21 0230 06/10/21 0140 06/11/21 0103  NA 136 138 134* 136  K 3.6 3.8 4.0 5.0  CL 101 105 102 99  CO2 27 26 26 26   GLUCOSE 119* 110* 126* 108*  BUN 12 11 10 11   CALCIUM 8.0* 7.9* 7.8* 8.7*  CREATININE 0.70 0.84 0.69 0.87  GFRNONAA >60 >60 >60 >60    LIVER FUNCTION TESTS: Recent Labs    06/08/21 0500 06/09/21 0230 06/10/21 0140 06/11/21 0103   BILITOT 0.4 0.4 0.1* 0.6  AST 78* 123* 92* 45*  ALT 42 65* 61* 50*  ALKPHOS 89 91 78 93  PROT 5.4* 5.4* 5.2* 6.0*  ALBUMIN 1.6* 1.5* 1.6* 1.9*    Assessment and Plan:  RLQ abscess --continued output, purulent today  Plan: Continue TID flushes with 5 cc NS. Record output Q shift. Dressing changes QD or PRN if soiled.  Call IR APP or on call IR MD if difficulty flushing or sudden change in drain output.  Repeat imaging/possible drain injection once output < 10 mL/QD (excluding flush material.)  Discharge planning: Please contact IR APP or on call IR MD prior to patient d/c to ensure appropriate follow up plans are in place. Typically patient will follow up with IR clinic 10-14 days post d/c for repeat imaging/possible drain injection. IR scheduler will contact patient with date/time of appointment. Patient will need to flush drain QD with 5 cc NS, record output QD, dressing changes every 2-3 days or earlier if soiled.   IR will continue to follow - please call with questions or concerns.     Electronically Signed: Pasty Spillers, PA 06/11/2021, 3:20 PM   I spent a total of 25 Minutes at the the patient's bedside AND on the patient's hospital floor or unit, greater than 50% of which was counseling/coordinating care for RLQ abscess drain

## 2021-06-11 NOTE — Progress Notes (Signed)
Family Medicine Teaching Service Daily Progress Note Intern Pager: [phone]  Patient name: Albert Love record number: [id number] Date of birth: Apr 23, 1941 Age: 80 y.o. 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  PMH significant for peptic ulcer s/p [name] patch, H. pylori, CRAO, prior intra-abdominal abscess with drains.  Sepsis (resolved) stay in setting of cecal mass/necrotic abscess s/p RLQ abscess drain placement (10/24) Patient remains afebrile.  He says that he is comfortable, without significant pain.  Overnight he did have episodes of tachycardia, which raises suspicion for potential further infection as he transition from IV antibiotics to p.o. antibiotics yesterday as his cultures returned with abundant Streptococcus, moderate H para flu..  We will keep a close eye on tachycardia and monitor for other signs of infection including fever, worsening clinical status.  Other etiologies to consider include anemia, pain, PE.  Should he develop fever or increasing white blood cell count, will reassess antibiotic management/need for further work-up. -Continue p.o. 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  No signs or symptoms of active bleeding. -Monitor with CBC -Consider IV iron transfusion should he become symptomatic, or hemoglobin down trends  Goals of care Patient remains DNR.  Have been attempting to get in contact with nephew [name] who is primary  contact person regarding decision on hospice agency.  Patient will not qualify for inpatient hospice facility as his prognosis is likely not less than 2 weeks.  Can consider SNF with hospice care if hospice care will be allowed in the setting, or home with home hospice if kidney is amenable to providing care for the patient.  We will continue to discuss with palliative care. 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 -Regular diet -Ensure TID, Magic cup TID -Assistance with meals  Hiccups Patient reports that hiccups are bothersome.  Was on Thorazine 3 times daily, but will change to p.o. Reglan. -Start p.o. Reglan 10 mg 3 times daily  Constipation Patient is yet to have a bowel movement since admission, he says he usually has bowel movement every 3 to 4 days.  Normoactive bowel sounds without rebound or guarding on exam.  We will continue to monitor for signs and symptoms of obstruction.  He likely has not yet had a BM due to decreased appetite, bedbound status, receiving narcotics.   -MiraLAX twice daily -Added senna  FEN/GI: Regular PPx: SCDs Dispo: Pending hospice agency finalization, will discuss with nephew today.  Subjective:  Mr. Albert Love denies any complaints today.  He says that he rested much better after receiving morphine and his pain is significantly improved.  His only concern is that he has yet to have a bowel movement. Objective: Temp:  [97.2 F (36.2 C)-97.8 F (36.6 C)] 97.7 F (36.5 C) (10/28 0400) Pulse Rate:  [98-120] 106 (10/28 0400) Resp:  [18-20] 20 (10/28 0400) BP: (129-168)/(81-97) 143/87 (10/28 0400) SpO2:  [94 %-97 %]  96 % (10/28 0400) Physical Exam: General: Resting comfortably in bed, in no distress Cardiovascular: Regular rate and rhythm Respiratory: Clear in all  lung fields.  Normal work of breathing Abdomen: Soft, no tenderness to palpation.  Normal active bowel sounds.  No rebound or guarding.  RLQ drain in place with dry dressing intact and minimal white purulent fluid draining. Extremities: Warm, dry, no edema, well perfused  Laboratory: Recent Labs  Lab 06/09/21 0230 06/10/21 0140 06/11/21 0103  WBC 16.7* 11.7* 16.3*  HGB 8.4* 9.0* 10.8*  HCT 27.8* 28.8* 35.1*  PLT 429* 413* 561*   Recent Labs  Lab 06/09/21 0230 06/10/21 0140 06/11/21 0103  NA 138 134* 136  K 3.8 4.0 5.0  CL 105 102 99  CO2 26 26 26   BUN 11 10 11   CREATININE 0.84 0.69 0.87  CALCIUM 7.9* 7.8* 8.7*  PROT 5.4* 5.2* 6.0*  BILITOT 0.4 0.1* 0.6  ALKPHOS 91 78 93  ALT 65* 61* 50*  AST 123* 92* 45*  GLUCOSE 110* 126* 108*     Imaging/Diagnostic Tests: No results found.   Orvis Brill, DO 06/11/2021, 7:03 AM PGY-1, [place] Intern pager: [phone], text pages welcome

## 2021-06-12 ENCOUNTER — Inpatient Hospital Stay (HOSPITAL_COMMUNITY): Payer: Medicare Other

## 2021-06-12 DIAGNOSIS — Z515 Encounter for palliative care: Secondary | ICD-10-CM | POA: Diagnosis not present

## 2021-06-12 DIAGNOSIS — L0291 Cutaneous abscess, unspecified: Secondary | ICD-10-CM | POA: Diagnosis not present

## 2021-06-12 DIAGNOSIS — Z7189 Other specified counseling: Secondary | ICD-10-CM | POA: Diagnosis not present

## 2021-06-12 DIAGNOSIS — K6389 Other specified diseases of intestine: Secondary | ICD-10-CM | POA: Diagnosis not present

## 2021-06-12 LAB — CBC
HCT: 31.5 % — ABNORMAL LOW (ref 39.0–52.0)
Hemoglobin: 9.7 g/dL — ABNORMAL LOW (ref 13.0–17.0)
MCH: 20.7 pg — ABNORMAL LOW (ref 26.0–34.0)
MCHC: 30.8 g/dL (ref 30.0–36.0)
MCV: 67.2 fL — ABNORMAL LOW (ref 80.0–100.0)
Platelets: 530 10*3/uL — ABNORMAL HIGH (ref 150–400)
RBC: 4.69 MIL/uL (ref 4.22–5.81)
RDW: 15.9 % — ABNORMAL HIGH (ref 11.5–15.5)
WBC: 16.2 10*3/uL — ABNORMAL HIGH (ref 4.0–10.5)
nRBC: 0 % (ref 0.0–0.2)

## 2021-06-12 LAB — COMPREHENSIVE METABOLIC PANEL
ALT: 38 U/L (ref 0–44)
AST: 30 U/L (ref 15–41)
Albumin: 1.9 g/dL — ABNORMAL LOW (ref 3.5–5.0)
Alkaline Phosphatase: 79 U/L (ref 38–126)
Anion gap: 6 (ref 5–15)
BUN: 12 mg/dL (ref 8–23)
CO2: 29 mmol/L (ref 22–32)
Calcium: 8.6 mg/dL — ABNORMAL LOW (ref 8.9–10.3)
Chloride: 100 mmol/L (ref 98–111)
Creatinine, Ser: 0.79 mg/dL (ref 0.61–1.24)
GFR, Estimated: >60 mL/min (ref >60)
Glucose, Bld: 112 mg/dL — ABNORMAL HIGH (ref 70–99)
Potassium: 5 mmol/L (ref 3.5–5.1)
Sodium: 135 mmol/L (ref 135–145)
Total Bilirubin: 0.6 mg/dL (ref 0.3–1.2)
Total Protein: 5.8 g/dL — ABNORMAL LOW (ref 6.5–8.1)

## 2021-06-12 IMAGING — DX DG ABDOMEN 1V
1 series · 2 of 2 positions shown · non-contrast
Comparison: [DATE].

CLINICAL DATA: Constipation.

EXAM:
ABDOMEN - 1 VIEW

[Series 1: abdomen · 0.14mm/px · 2 of 2 slices shown]
[im 1/2]
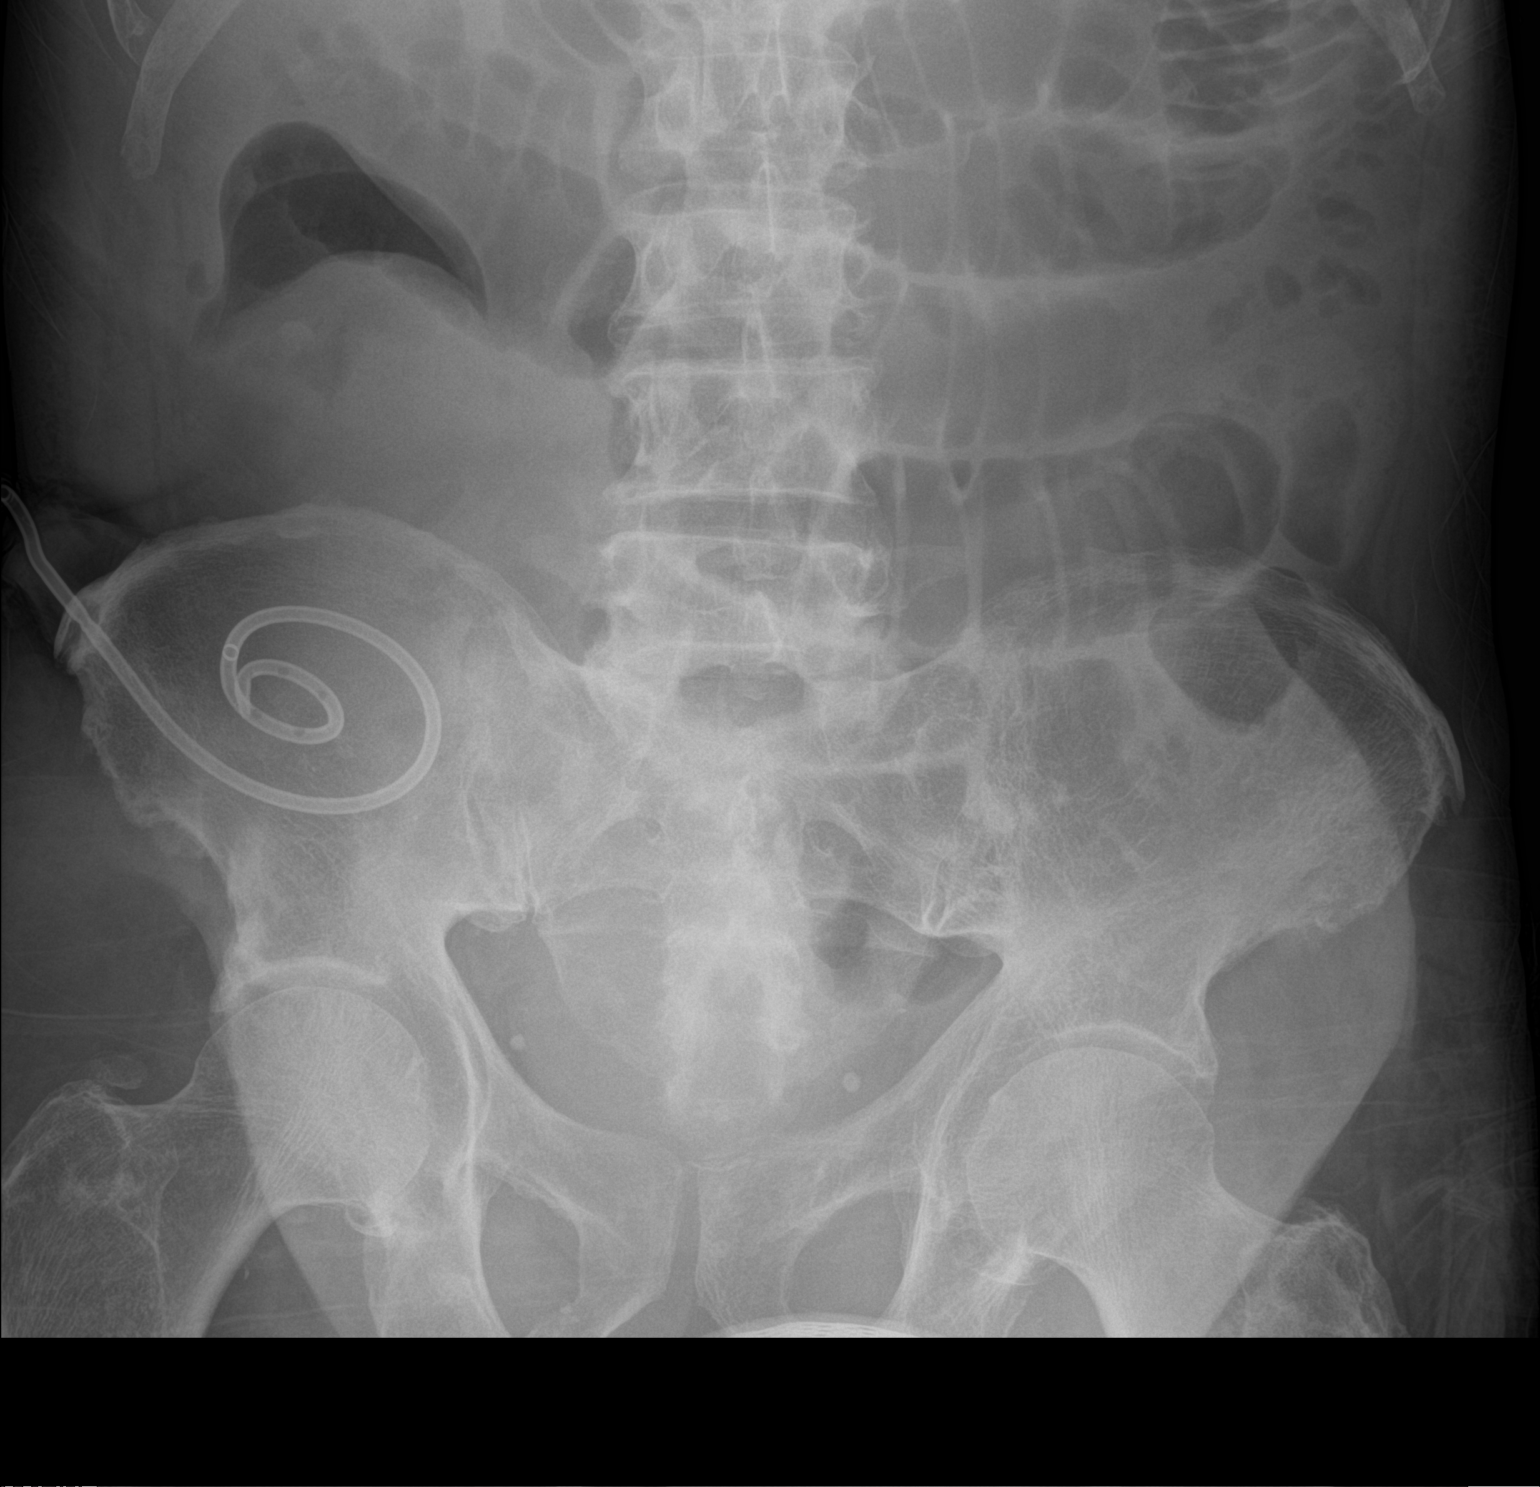
[im 2/2]
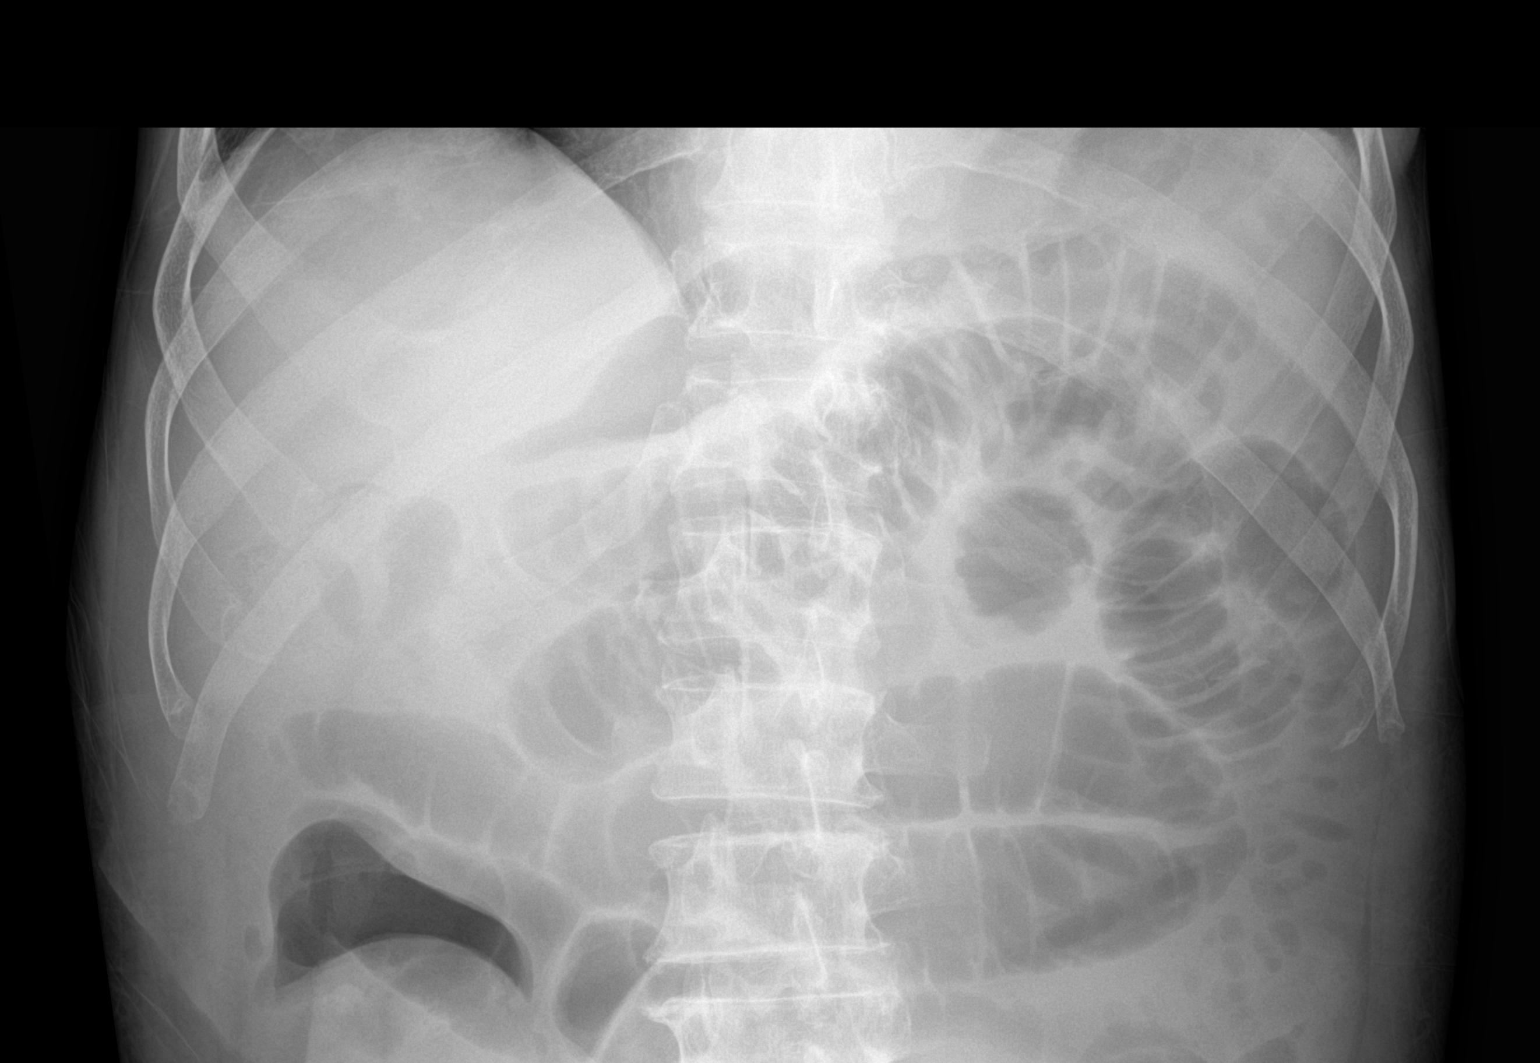

[2 of 2 positions shown; findings below may reference images not displayed]

FINDINGS: Dilated small bowel loops are noted concerning for distal small
bowel obstruction or possibly ileus. Percutaneous drainage catheter
is noted in right lower quadrant. No colonic dilatation is noted. No
significant stool burden is noted. Phleboliths are noted in the
pelvis.
IMPRESSION: Moderately dilated small bowel loops are noted concerning for distal
small bowel obstruction or possibly ileus. Percutaneous drainage
catheter is noted in right lower quadrant.

## 2021-06-12 MED ORDER — POLYETHYLENE GLYCOL 3350 17 G PO PACK
34.0000 g | PACK | Freq: Two times a day (BID) | ORAL | Status: DC
  Administered 2021-06-12 – 2021-06-14 (×5): 34 g via ORAL
  Filled 2021-06-12 (×6): qty 2

## 2021-06-12 MED ORDER — METOCLOPRAMIDE HCL 10 MG PO TABS
10.0000 mg | ORAL_TABLET | Freq: Once | ORAL | Status: AC
  Administered 2021-06-12: 10 mg via ORAL
  Filled 2021-06-12: qty 1

## 2021-06-12 NOTE — Progress Notes (Signed)
FPTS Interim Progress Note  S:Patient resting comfortably in bed, had good dinner, reports that he enjoyed his cheeseburger. Denies any abdominal pain or discomfort, nausea or vomiting. Denies any concerns at this time.   O: BP (!) 152/94 (BP Location: Left Arm)   Pulse 87   Temp 98.7 F (37.1 C) (Oral)   Resp 18   Ht 5\' 11"  (1.803 m)   Wt 61.7 kg   SpO2 97%   BMI 18.97 kg/m   General: Patient sitting upright in bed, in no acute distress. CV: RRR, no murmurs or gallops auscultated Resp: CTAB Abdomen: soft, nontender, nondistended, presence of bowel sounds Ext: radial and distal pulses strong and equal bilaterally, no LE edema noted bilaterally  Psych: mood appropriate, very pleasant   A/P: 80 year old male with cecal mass/abscess now status post IR drain placement with essentially resolved sepsis.  -Concern for prior abdominal pain and distention which has improved on my exam. Prior KUB notable for moderately dilated small bowel loops concerning for distal small bowel obstruction or possibly ileus. Reassuringly symptoms improved at this time, cecal mass also something to consider. If worsening symptoms, consider general surgery input for further recommendations. No further intervention for now.  -Chronic anemia, Hgb stable at 9.7. -remainder of plan per day team, continue current plan in place   [place], DO 06/12/2021, 9:58 PM PGY-2, [place] Medicine Service pager [phone]

## 2021-06-12 NOTE — TOC Progression Note (Signed)
Transition of Care [facility]) - Progression Note    Patient Details  Name: Albert Love MRN: [id number] Date of Birth: Apr 13, 1941  Transition [facility]) CM/SW Contact  Joanne Chars, LCSW Phone Number: 06/12/2021, 9:32 AM  Clinical Narrative:   CSW attempted to call pt nephew [name].  No answer.  LM.    Expected Discharge Plan: Home w Hospice Care Barriers to Discharge: Continued Medical Work up  Expected Discharge Plan and Services Expected Discharge Plan: [place]   Discharge Planning Services: CM Consult   Living arrangements for the past 2 months: Apartment                                       Social Determinants of Health (SDOH) Interventions    Readmission Risk Interventions No flowsheet data found.

## 2021-06-12 NOTE — Progress Notes (Addendum)
[facility]) Hospital Liaison Note  Received request from Transitions of Care Manager Winferd Humphrey, LCSW, for hospice services at facility after discharge. Chart and patient information reviewed by [facility] physician. Hospice eligibility confirmed.  Spoke with nephew [name] to initiate education related to hospice philosophy, services and team approach to care. Patient/family verbalized understanding of information provided.   Patient is currently awaiting placement in facility at discharge.    ACC information and contact numbers given to family. Above information shared with [place].   Hospital liaison will continue to follow for discharge disposition.  Please do not hesitate to call with any hospice related questions or concerns.   Thank you for the opportunity to participate in this patient's care.   Bobbie "Loren Racer, RN, BSN [facility] Liaison [phone]

## 2021-06-12 NOTE — Progress Notes (Addendum)
Family Medicine Teaching Service Daily Progress Note Intern Pager: [phone]  Patient name: Albert Love record number: [id number] Date of birth: 1940-09-19 Age: 80 y.o. Gender: male  Primary Care Provider: Gladys Damme, MD Consultants: General surgery, GI, palliative care  Code Status: DNR  Pt Overview and Major Events to Date:  10/23: Admitted 10/24: CT-guided RLQ abscess drain placed 10/25: code status changed to DNR   Assessment and Plan: Albert Love is a 80 y.o. male with history of peptic ulcer s/p [name] patch, H. 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  Leukocytosis improved from 16.2 to 13.4. Denies any pain, reports pain has been well-controlled under current regimen.  -Continue Augmentin twice daily (10/27-) -Oxycodone IR 10 mg q6h -s/p RLQ drain by IR 06/07/21 -Patient to follow-up with IR 10 to 14 days after discharge  Constipation  Patient reports not having a bowel movement during this hospitalization. No distention noted on exam. Likely due to poor oral intake in addition to opioid use. KUB obtained yesterday demonstrated moderately dilated small bowel loops are noted concerning for distal small bowel obstruction or possibly ileus, per cutaneous drainage catheter noted in RLQ.  -miralax bid  -senna bid  -monitor output    Iron deficiency anemia Stable, Hgb 9.9 this morning. No active bleeding.  -am CBC   Goals of care  DNR  Ongoing discussions, efforts made to contact patient's nephew. Possible home with hospice vs SNF.  -Palliative care following, appreciate recommendations -Appreciate involvement of SW with ongoing disposition planning  -PT reconsulted for evaluation    Severe protein calorie malnutrition  Cachexia  Maintains poor intake although ate a cheeseburger last  night without difficulty.  -Regular diet -Ensure tid -Magic cup tid -Assistance with meals    FEN/GI: regular  PPx: SCDs Dispo: Likely home with hospice   Subjective:  No acute overnight events reported. States that he denies abdominal pain, chest pain, dyspnea or other pain. Denies nausea and vomiting, enjoyed his cheeseburger last night. Denies any other concerns this morning.   Objective: Temp:  [98.7 F (37.1 C)-98.8 F (37.1 C)] 98.8 F (37.1 C) (10/30 0315) Pulse Rate:  [65-101] 85 (10/30 0315) Resp:  [17-18] 18 (10/30 0315) BP: (131-160)/(74-100) 131/79 (10/30 0315) SpO2:  [92 %-100 %] 98 % (10/30 0315) Physical Exam: General: Patient sleeping comfortably in bed, easily awakens to my exam. Cardiovascular: RRR, no murmurs or gallops  Respiratory: CTAB, no wheezing or rales  Abdomen: soft, mild soreness below umbilicus but denies tenderness, nondistended, presence of bowel sounds  Extremities: radial and distal pulses strong and equal bilaterally, no LE edema noted bilaterally Neuro: awake and alert Psych: mood appropriate   Laboratory: Recent Labs  Lab 06/11/21 0103 06/12/21 0202 06/13/21 0134  WBC 16.3* 16.2* 13.4*  HGB 10.8* 9.7* 9.9*  HCT 35.1* 31.5* 32.3*  PLT 561* 530* 532*   Recent Labs  Lab 06/11/21 0103 06/12/21 0202 06/13/21 0134  NA 136 135 135  K 5.0 5.0 4.1  CL 99 100 100  CO2 26 29 28   BUN 11 12 11   CREATININE 0.87 0.79 0.79  CALCIUM 8.7* 8.6* 8.7*  PROT 6.0* 5.8* 6.0*  BILITOT 0.6 0.6 0.4  ALKPHOS 93 79 77  ALT 50* 38 38  AST 45* 30 42*  GLUCOSE 108* 112* 153*      Imaging/Diagnostic Tests: DG Abd 1 View  Result Date: 06/12/2021 CLINICAL DATA:  Constipation. EXAM: ABDOMEN - 1 VIEW COMPARISON:  June 06, 2021. FINDINGS: Dilated small bowel loops are noted concerning for distal small bowel obstruction or possibly ileus. Percutaneous drainage catheter is noted in right lower quadrant. No colonic dilatation is noted. No  significant stool burden is noted. Phleboliths are noted in the pelvis. IMPRESSION: Moderately dilated small bowel loops are noted concerning for distal small bowel obstruction or possibly ileus. Percutaneous drainage catheter is noted in right lower quadrant. Electronically Signed   By: Marijo Conception M.D.   On: 06/12/2021 11:41     Donney Dice, DO 06/13/2021, 6:03 AM PGY-2, [place] Intern pager: [phone], text pages welcome

## 2021-06-12 NOTE — TOC Progression Note (Addendum)
Transition of Care [facility]) - Progression Note    Patient Details  Name: Albert Love MRN: [id number] Date of Birth: Jul 01, 1941  Transition of Care [facility]) CM/SW Contact  Joanne Chars, LCSW Phone Number: 06/12/2021, 10:38 AM  Clinical Narrative:    CSW LM with nephew [name].  1030: CSW received call from [place].  CSW asked about his decision of home hospice vs [facility] [facility].  [name] said pt lives alone and does not have caregiver support.  His children are not involved, [name] works full time.  Would like to pursue [facility] place.  CSW spoke with Sonia Baller at [facility]  They had not heard back from nephew after initial phone conversation.  They have not verified it pt eligible for residential so they will verify now and call back.  1130: Per chat with providers, pt likely not eligible for hospice residential.  Discussed possible SNF with hospice option.  Per epic notes, pt was unwilling to work with PT.  After several attempts, PT signed off on 10/27.  Pt does not have LTC payer.  They will reconsult PT about possible SNF referral.     Expected Discharge Plan: Home w Hospice Care Barriers to Discharge: Continued Medical Work up  Expected Discharge Plan and Services Expected Discharge Plan: [place]   Discharge Planning Services: CM Consult   Living arrangements for the past 2 months: Apartment                                       Social Determinants of Health (SDOH) Interventions    Readmission Risk Interventions No flowsheet data found.

## 2021-06-12 NOTE — Progress Notes (Signed)
   Palliative Medicine Inpatient Follow Up Note  Consulting Provider: Caroll Rancher   Reason for consult:   [place] Palliative Medicine Consult  Reason for Consult? Goals of care in light of possible abdominal surgery    HPI:  Per intake H&P --> Albert Love is a 64 year: PMH significant for peptic ulcer s/p [name] patch, H. Pylori, CRAO, prior intra-abdominal abscesses with drains. 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  This is not resectable.   Palliative care has been asked to get involved to further address goals of care.  Today's Discussion (06/12/2021):  *Please note that this is a verbal dictation therefore any spelling or grammatical errors are due to the "[place]" system interpretation.  Chart reviewed.   I met this late afternoon with Albert Love at bedside.  He shares with me that he is not in any pain this afternoon.  He expresses that his appetite is not great but also not terrible.  We reviewed the importance of him getting up and out of bed not only for him physically but also for his spirit.  We discussed that physical therapy has been ordered again so that we may identify a safer place for him to transition after hospitalization.  I shared with [name] that although he is hospice appropriate he presently is not inpatient hospice appropriate as I suspect he will live greater than 2 weeks.  He was surprised to hear this though he understands.  Questions and concerns addressed   Objective Assessment: Vital Signs Vitals:   06/12/21 0400 06/12/21 0805  BP: (!) 143/86 (!) 144/80  Pulse: 90 83  Resp: 18 18  Temp: 97.9  F (36.6 C)   SpO2: 96% 94%    Intake/Output Summary (Last 24 hours) at 06/12/2021 1501 Last data filed at 06/12/2021 1100 Gross per 24 hour  Intake 135 ml  Output 1125 ml  Net -990 ml    Last Weight  Most recent update: 06/05/2021 10:45 PM    Weight  61.7 kg (136 lb)            Gen: Very frail elderly African-American male in no acute distress HEENT: moist mucous membranes CV: Regular rate and rhythm, no murmurs rubs or gallops PULM: clear to auscultation bilaterally ABD: (+) R side drain w/ dressing in place EXT: No edema Neuro: Alert and oriented x3  SUMMARY OF RECOMMENDATIONS   DNAR/DNI   MOST  completed   TOC - Helping with DC plan   Ongoing incremental palliative care support during hospitalization  Time Spent: 25 Greater than 50% of the time was spent in counseling and coordination of care ______________________________________________________________________________________ [place] Team Team Cell Phone: [phone] Please utilize secure chat with additional questions, if there is no response within 30 minutes please call the above phone number  Palliative Medicine Team providers are available by phone from 7am to 7pm daily and can be reached through the team cell phone.  Should this patient require assistance outside of these hours, please call the patient's attending physician.

## 2021-06-12 NOTE — Progress Notes (Signed)
Referring Physician(s): Dr Susa Simmonds  Supervising Physician: Juliet Rude  Patient Status:  [facility] - In-pt  Chief Complaint:  RLQ abscess  Subjective:  Drain placed in IR 10/24 Output slowing but still significant Op is thick yellow fluid Flushes easily Denies pain or fever    Allergies: Patient has no known allergies.  Medications: Prior to Admission medications   Medication Sig Start Date End Date Taking? Authorizing Provider  aspirin 81 MG chewable tablet Chew 1 tablet (81 mg total) by mouth daily. 01/21/21  Yes Gladys Damme, MD  atorvastatin (LIPITOR) 80 MG tablet TAKE 1 TABLET (80 MG TOTAL) BY MOUTH DAILY. Patient taking differently: Take 80 mg by mouth daily. 01/21/21 01/21/22 Yes Gladys Damme, MD  ferrous sulfate 325 (65 FE) MG EC tablet Take 1 tablet (325 mg total) by mouth daily with breakfast. 05/12/21  Yes Gatha Mayer, MD     Vital Signs: BP (!) 144/80 (BP Location: Left Arm)   Pulse 83   Temp 97.9 F (36.6 C) (Oral)   Resp 18   Ht 5\' 11"  (4.332 m)   Wt 136 lb (61.7 kg)   SpO2 94%   BMI 18.97 kg/m   Physical Exam Vitals reviewed.  Skin:    General: Skin is warm.     Comments: Site is c/d/I NT no bleeding OP is thick yellow fluid 25cc yesterday Flushes easily Report Status 06/11/2021 FINAL  Organism ID, Bacteria STREPTOCOCCUS CONSTELLATUS       Imaging: DG Abd 1 View  Result Date: 06/12/2021 CLINICAL DATA:  Constipation. EXAM: ABDOMEN - 1 VIEW COMPARISON:  June 06, 2021. FINDINGS: Dilated small bowel loops are noted concerning for distal small bowel obstruction or possibly ileus. Percutaneous drainage catheter is noted in right lower quadrant. No colonic dilatation is noted. No significant stool burden is noted. Phleboliths are noted in the pelvis. IMPRESSION: Moderately dilated small bowel loops are noted concerning for distal small bowel obstruction or possibly ileus. Percutaneous drainage catheter is noted in right lower  quadrant. Electronically Signed   By: Marijo Conception M.D.   On: 06/12/2021 11:41    Labs:  CBC: Recent Labs    06/09/21 0230 06/10/21 0140 06/11/21 0103 06/12/21 0202  WBC 16.7* 11.7* 16.3* 16.2*  HGB 8.4* 9.0* 10.8* 9.7*  HCT 27.8* 28.8* 35.1* 31.5*  PLT 429* 413* 561* 530*    COAGS: Recent Labs    10/19/20 1350  INR 1.1  APTT 38*    BMP: Recent Labs    06/09/21 0230 06/10/21 0140 06/11/21 0103 06/12/21 0202  NA 138 134* 136 135  K 3.8 4.0 5.0 5.0  CL 105 102 99 100  CO2 26 26 26 29   GLUCOSE 110* 126* 108* 112*  BUN 11 10 11 12   CALCIUM 7.9* 7.8* 8.7* 8.6*  CREATININE 0.84 0.69 0.87 0.79  GFRNONAA >60 >60 >60 >60    LIVER FUNCTION TESTS: Recent Labs    06/09/21 0230 06/10/21 0140 06/11/21 0103 06/12/21 0202  BILITOT 0.4 0.1* 0.6 0.6  AST 123* 92* 45* 30  ALT 65* 61* 50* 38  ALKPHOS 91 78 93 79  PROT 5.4* 5.2* 6.0* 5.8*  ALBUMIN 1.5* 1.6* 1.9* 1.9*    Assessment and Plan:  Drain intact Continue to follow Flushes easily Consider re CT when OP is less than 10 cc/24 hrs  Electronically Signed: Lavonia Drafts, PA-C 06/12/2021, 1:49 PM   I spent a total of 15 Minutes at the the patient's bedside AND on  the patient's hospital floor or unit, greater than 50% of which was counseling/coordinating care for RLQ abscess

## 2021-06-13 DIAGNOSIS — K5900 Constipation, unspecified: Secondary | ICD-10-CM | POA: Diagnosis not present

## 2021-06-13 DIAGNOSIS — K6389 Other specified diseases of intestine: Secondary | ICD-10-CM | POA: Diagnosis not present

## 2021-06-13 DIAGNOSIS — L0291 Cutaneous abscess, unspecified: Secondary | ICD-10-CM | POA: Diagnosis not present

## 2021-06-13 DIAGNOSIS — R627 Adult failure to thrive: Secondary | ICD-10-CM

## 2021-06-13 LAB — COMPREHENSIVE METABOLIC PANEL
ALT: 38 U/L (ref 0–44)
AST: 42 U/L — ABNORMAL HIGH (ref 15–41)
Albumin: 2 g/dL — ABNORMAL LOW (ref 3.5–5.0)
Alkaline Phosphatase: 77 U/L (ref 38–126)
Anion gap: 7 (ref 5–15)
BUN: 11 mg/dL (ref 8–23)
CO2: 28 mmol/L (ref 22–32)
Calcium: 8.7 mg/dL — ABNORMAL LOW (ref 8.9–10.3)
Chloride: 100 mmol/L (ref 98–111)
Creatinine, Ser: 0.79 mg/dL (ref 0.61–1.24)
GFR, Estimated: >60 mL/min (ref >60)
Glucose, Bld: 153 mg/dL — ABNORMAL HIGH (ref 70–99)
Potassium: 4.1 mmol/L (ref 3.5–5.1)
Sodium: 135 mmol/L (ref 135–145)
Total Bilirubin: 0.4 mg/dL (ref 0.3–1.2)
Total Protein: 6 g/dL — ABNORMAL LOW (ref 6.5–8.1)

## 2021-06-13 LAB — CBC
HCT: 32.3 % — ABNORMAL LOW (ref 39.0–52.0)
Hemoglobin: 9.9 g/dL — ABNORMAL LOW (ref 13.0–17.0)
MCH: 21 pg — ABNORMAL LOW (ref 26.0–34.0)
MCHC: 30.7 g/dL (ref 30.0–36.0)
MCV: 68.6 fL — ABNORMAL LOW (ref 80.0–100.0)
Platelets: 532 10*3/uL — ABNORMAL HIGH (ref 150–400)
RBC: 4.71 MIL/uL (ref 4.22–5.81)
RDW: 17.2 % — ABNORMAL HIGH (ref 11.5–15.5)
WBC: 13.4 10*3/uL — ABNORMAL HIGH (ref 4.0–10.5)
nRBC: 0 % (ref 0.0–0.2)

## 2021-06-13 MED ORDER — SIMETHICONE 80 MG PO CHEW
80.0000 mg | CHEWABLE_TABLET | Freq: Once | ORAL | Status: AC
  Administered 2021-06-14: 80 mg via ORAL

## 2021-06-13 MED ORDER — ONDANSETRON HCL 4 MG/2ML IJ SOLN
4.0000 mg | Freq: Once | INTRAMUSCULAR | Status: AC
  Administered 2021-06-14: 4 mg via INTRAVENOUS
  Filled 2021-06-13: qty 2

## 2021-06-13 NOTE — Progress Notes (Signed)
[facility]) Hospital Liaison Note  Plan remains for hospice services at facility once placement for patient determined.   Hospital liaison will continue to follow for discharge disposition.   Please do not hesitate to call with any hospice related questions or concerns.   Thank you,   Bobbie "Loren Racer, [place], BSN [facility] Liaison [phone]

## 2021-06-13 NOTE — NC FL2 (Signed)
[place] LEVEL OF CARE SCREENING TOOL     IDENTIFICATION  Patient Name: Albert Love Birthdate: 02/05/1941 Sex: male Admission Date (Current Location): 06/05/2021  [facility] and [place] Number:  Herbalist and Address:  [facility] 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   Pneumoperitoneum 09/05/2019   Acute renal insufficiency 09/05/2019   Perforated gastric ulcer s/p omental [name] patch 09/05/2019 09/05/2019   Coagulopathy ([place]) 09/05/2019    Orientation RESPIRATION BLADDER Height & Weight     Self, Time, Situation, Place  Normal Incontinent, External catheter Weight: 136 lb (61.7 kg) Height:  5\' 11"  (180.3 cm)  BEHAVIORAL SYMPTOMS/MOOD NEUROLOGICAL BOWEL NUTRITION STATUS      Continent Diet (see discharge summary)  AMBULATORY STATUS COMMUNICATION OF NEEDS Skin   Total Care Verbally Normal                       Personal Care Assistance Level of Assistance  Bathing, Feeding, Dressing Bathing Assistance: Limited assistance Feeding assistance: Limited assistance Dressing  Assistance: Limited assistance     Functional Limitations Info  Sight, Hearing, Speech Sight Info: Adequate Hearing Info: Adequate Speech Info: Adequate    SPECIAL CARE FACTORS FREQUENCY  PT (By licensed PT), OT (By licensed OT)     PT Frequency: 5x week OT Frequency: 5x week            Contractures Contractures Info: Not present    Additional Factors Info  Code Status, Allergies Code Status Info: DNR Allergies Info: NKA           Current Medications (06/13/2021):  This is the current hospital active medication list Current Facility-Administered Medications  Medication Dose Route Frequency Provider Last Rate Last Admin   0.9 %  sodium chloride infusion   Intravenous Continuous Alcus Dad, MD   Stopped at 06/09/21 2102   acetaminophen (TYLENOL) tablet 650 mg  650 mg Oral Q6H PRN Brimage, Vondra, DO   650 mg at 06/10/21 0623   amoxicillin-clavulanate (AUGMENTIN) 875-125 MG per tablet 1 tablet  1 tablet Oral Q12H Alcus Dad, MD   1 tablet at 06/13/21 0272   aspirin chewable tablet 81 mg  81 mg Oral Daily Martyn Malay, MD   81 mg at 06/13/21 0909   feeding supplement (ENSURE ENLIVE / ENSURE PLUS) liquid 237 mL  237 mL Oral TID BM Martyn Malay, MD   237 mL at 06/13/21 0909   metoCLOPramide (REGLAN) tablet 10 mg  10 mg Oral TID AC Dameron, Marisa, DO   10 mg at 06/13/21 0800  multivitamin with minerals tablet 1 tablet  1 tablet Oral Daily Ezequiel Essex, MD   1 tablet at 06/13/21 0856   oxyCODONE (Oxy IR/ROXICODONE) immediate release tablet 10 mg  10 mg Oral Q6H Dameron, Marisa, DO   10 mg at 06/13/21 0552   polyethylene glycol (MIRALAX / GLYCOLAX) packet 34 g  34 g Oral BID Brimage, Vondra, DO   34 g at 06/13/21 0800   senna (SENOKOT) tablet 8.6 mg  1 tablet Oral BID Carollee Leitz, MD   8.6 mg at 06/13/21 0910   sodium chloride flush (NS) 0.9 % injection 5 mL  5 mL Intracatheter Q8H Mir, Paula Libra, MD   5 mL at 06/13/21 9437     Discharge  Medications: Please see discharge summary for a list of discharge medications.  Relevant Imaging Results:  Relevant Lab Results:   Additional Information SSN: [id number].  Pt is not vaccinated for covid.  Joanne Chars, LCSW

## 2021-06-13 NOTE — TOC Progression Note (Signed)
Transition of Care [facility]) - Progression Note    Patient Details  Name: Albert Love MRN: [id number] Date of Birth: [birth year]  [facility]) CM/SW Contact  Joanne Chars, LCSW Phone Number: 06/13/2021, 2:22 PM  Clinical Narrative:   CSW met with pt regarding new recommendation for SNF.  Pt agreeable to this, choice document given, permission given to send out referral in hub.      Expected Discharge Plan: Skilled Nursing Facility Barriers to Discharge: SNF Pending bed offer  Expected Discharge Plan and Services Expected Discharge Plan: [place]   Discharge Planning Services: CM Consult   Living arrangements for the past 2 months: Apartment                                       Social Determinants of Health (SDOH) Interventions    Readmission Risk Interventions No flowsheet data found.

## 2021-06-13 NOTE — Evaluation (Signed)
Physical Therapy Evaluation Patient Details Name: Albert Love MRN: [id number] DOB: 30-Aug-1940 Today's Date: 06/13/2021  History of Present Illness  80 y.o. male presenting to ED 10/22 with 3 week Hx of worsening bowel pain, weakness and fatigue. 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  Clinical Impression   Pt admitted secondary to problem above with deficits below. PTA patient was living alone but had increased difficulty walking over several weeks due to RLE increasing weakness. Pt currently requires 2 person assist for OOB to chair (due to anxiety and dizziness). Patient reports RLE strength has improved since drain placed and is now hopeful he'll be able to walk to the bathroom in time.  Anticipate patient will benefit from short-term PT to address problems listed below.Will continue to follow acutely to maximize functional mobility independence and safety.  Unclear if pt will be more appropriate at time of discharge for SNF with therapies to maximize independence vs SNF with Hospice services. Will follow.         Recommendations for follow up therapy are one component of a multi-disciplinary discharge planning process, led by the attending physician.  Recommendations may be updated based on patient status, additional functional criteria and insurance authorization.  Follow Up Recommendations Skilled nursing-short term rehab (<3 hours/day) (vs SNF with Hospice services)    Assistance Recommended at Discharge Frequent or constant Supervision/Assistance  Functional Status Assessment Patient has had a recent decline in their functional status and/or demonstrates limited ability to make significant improvements in function in a reasonable and  predictable amount of time  Equipment Recommendations  Rolling walker (2 wheels);Hospital bed    Recommendations for Other Services       Precautions / Restrictions Precautions Precautions: Fall      Mobility  Bed Mobility Overal bed mobility: Needs Assistance Bed Mobility: Supine to Sit     Supine to sit: Mod assist     General bed mobility comments: pt preferred to pull up to long-sitting and then rotate legs over EOB (assist to raise torso; pt then able to pivot although used his UEs to help move RLE due to hip flexion weakness)    Transfers Overall transfer level: Needs assistance Equipment used: 2 person hand held assist Transfers: Sit to/from Stand Sit to Stand: Min assist           General transfer comment: pt felt more comfortable with 2 person assist and his nephew assisted    Ambulation/Gait                Stairs            Wheelchair Mobility    Modified Rankin (Stroke Patients Only)       Balance Overall balance assessment: Needs assistance Sitting-balance support: Bilateral upper extremity supported;Feet supported Sitting balance-Leahy Scale: Fair Sitting balance - Comments: Maintained static sitting balance at EOB for grooming tasks. No external assist requried.   Standing balance support: Bilateral upper extremity supported;Reliant on assistive device for balance;During functional activity Standing balance-Leahy Scale: Poor                               Pertinent Vitals/Pain Pain Assessment: No/denies pain    Home Living Family/patient expects to be discharged to:: Other (Comment) Living  Arrangements: Alone Available Help at Discharge: Family;Available PRN/intermittently (Nephew works during the day) Type of Home: Other(Comment) Home Access: Elevator (7th floor)       Home Layout: One level Home Equipment: Grab bars - tub/shower;Grab bars - toilet      Prior Function Prior Level of Function :  Independent/Modified Independent             Mobility Comments: I without AD ADLs Comments: I with ADLs/IADLs; does not drive; takes a cab to the grocery store.     Hand Dominance   Dominant Hand: Right    Extremity/Trunk Assessment   Upper Extremity Assessment Upper Extremity Assessment: Generalized weakness    Lower Extremity Assessment Lower Extremity Assessment: Generalized weakness;RLE deficits/detail RLE Deficits / Details: pt reports strength has improved; rt hip flexion 2+, knee extension 4/5, ankle DF 5/5    Cervical / Trunk Assessment Cervical / Trunk Assessment: Kyphotic (Mild)  Communication   Communication: No difficulties  Cognition Arousal/Alertness: Awake/alert;Lethargic Behavior During Therapy: WFL for tasks assessed/performed Overall Cognitive Status: Within Functional Limits for tasks assessed                                 General Comments: Patient reports accepting diagnosis/prognosis stating "I'm ready to transition".        General Comments General comments (skin integrity, edema, etc.): Albert Love, nephew present. MD in during session.    Exercises     Assessment/Plan    PT Assessment Patient needs continued PT services  PT Problem List Decreased strength;Decreased activity tolerance;Decreased balance;Decreased mobility;Decreased knowledge of use of DME       PT Treatment Interventions DME instruction;Gait training;Functional mobility training;Therapeutic activities;Therapeutic exercise;Balance training;Patient/family education    PT Goals (Current goals can be found in the Care Plan section)  Acute Rehab PT Goals Patient Stated Goal: wants to be able to walk to the bathroom PT Goal Formulation: With patient Time For Goal Achievement: 06/27/21 Potential to Achieve Goals: Fair    Frequency Min 2X/week   Barriers to discharge Decreased caregiver support lives alone    Co-evaluation               AM-PAC PT "6  Clicks" Mobility  Outcome Measure Help needed turning from your back to your side while in a flat bed without using bedrails?: A Little Help needed moving from lying on your back to sitting on the side of a flat bed without using bedrails?: A Lot Help needed moving to and from a bed to a chair (including a wheelchair)?: Total Help needed standing up from a chair using your arms (e.g., wheelchair or bedside chair)?: Total Help needed to walk in hospital room?: Total Help needed climbing 3-5 steps with a railing? : Total 6 Click Score: 9    End of Session Equipment Utilized During Treatment: Gait belt Activity Tolerance: Patient limited by fatigue Patient left: in chair;with call bell/phone within reach;with chair alarm set;with nursing/sitter in room;with family/visitor present Nurse Communication: Mobility status PT Visit Diagnosis: Muscle weakness (generalized) (M62.81);Difficulty in walking, not elsewhere classified (R26.2)    Time: 2355-7322 PT Time Calculation (min) (ACUTE ONLY): 35 min   Charges:   PT Evaluation $PT Eval Moderate Complexity: 1 Mod PT Treatments $Therapeutic Activity: 8-22 mins         Albert Love, PT Acute Rehabilitation Services  Pager [phone] Office [phone]   Rexanne Mano 06/13/2021, 10:28 AM

## 2021-06-14 DIAGNOSIS — K6389 Other specified diseases of intestine: Secondary | ICD-10-CM | POA: Diagnosis not present

## 2021-06-14 LAB — COMPREHENSIVE METABOLIC PANEL
ALT: 40 U/L (ref 0–44)
AST: 39 U/L (ref 15–41)
Albumin: 2.2 g/dL — ABNORMAL LOW (ref 3.5–5.0)
Alkaline Phosphatase: 73 U/L (ref 38–126)
Anion gap: 6 (ref 5–15)
BUN: 11 mg/dL (ref 8–23)
CO2: 31 mmol/L (ref 22–32)
Calcium: 9.3 mg/dL (ref 8.9–10.3)
Chloride: 98 mmol/L (ref 98–111)
Creatinine, Ser: 0.81 mg/dL (ref 0.61–1.24)
GFR, Estimated: >60 mL/min (ref >60)
Glucose, Bld: 95 mg/dL (ref 70–99)
Potassium: 4.4 mmol/L (ref 3.5–5.1)
Sodium: 135 mmol/L (ref 135–145)
Total Bilirubin: 0.6 mg/dL (ref 0.3–1.2)
Total Protein: 6.1 g/dL — ABNORMAL LOW (ref 6.5–8.1)

## 2021-06-14 LAB — CBC
HCT: 32.7 % — ABNORMAL LOW (ref 39.0–52.0)
Hemoglobin: 9.9 g/dL — ABNORMAL LOW (ref 13.0–17.0)
MCH: 20.5 pg — ABNORMAL LOW (ref 26.0–34.0)
MCHC: 30.3 g/dL (ref 30.0–36.0)
MCV: 67.7 fL — ABNORMAL LOW (ref 80.0–100.0)
Platelets: 536 10*3/uL — ABNORMAL HIGH (ref 150–400)
RBC: 4.83 MIL/uL (ref 4.22–5.81)
RDW: 17.8 % — ABNORMAL HIGH (ref 11.5–15.5)
WBC: 11.3 10*3/uL — ABNORMAL HIGH (ref 4.0–10.5)
nRBC: 0 % (ref 0.0–0.2)

## 2021-06-14 MED ORDER — FAMOTIDINE 20 MG PO TABS
10.0000 mg | ORAL_TABLET | Freq: Every day | ORAL | Status: DC
  Administered 2021-06-14 – 2021-06-15 (×2): 10 mg via ORAL
  Filled 2021-06-14 (×2): qty 1

## 2021-06-14 MED ORDER — BOOST / RESOURCE BREEZE PO LIQD CUSTOM
1.0000 | Freq: Two times a day (BID) | ORAL | Status: DC
  Administered 2021-06-15 (×2): 1 via ORAL
  Filled 2021-06-14 (×3): qty 1

## 2021-06-14 NOTE — TOC Progression Note (Signed)
Transition of Care [facility]) - Progression Note    Patient Details  Name: Albert Love MRN: [id number] Date of Birth: September 21, 1940  Transition of Care [facility]) CM/SW [place], LCSW Phone Number: 06/14/2021, 3:42 PM  Clinical Narrative:    12pm-CSW spoke with patient's nephew and provided bed offers. He reported preference for [facility]. [facility] able to [facility] patient tomorrow pending insurance auth and covid test.   3:30pm-CSW received call from [facility] that they now do not have a bed available tomorrow. CSW spoke with patient's nephew and he is requesting [facility] or [facility] since they are near him on [address]. CSW awaiting response from facilities.    Expected Discharge Plan: Skilled Nursing Facility Barriers to Discharge: SNF Pending bed offer  Expected Discharge Plan and Services Expected Discharge Plan: [place]   Discharge Planning Services: CM Consult   Living arrangements for the past 2 months: Apartment                                       Social Determinants of Health (SDOH) Interventions    Readmission Risk Interventions No flowsheet data found.

## 2021-06-14 NOTE — Progress Notes (Addendum)
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 - Ongoing  GOAL:   Patient will meet greater than or equal to 90% of their needs - Ongoing  MONITOR:   PO intake, Supplement acceptance, Weight trends, Labs  REASON FOR ASSESSMENT:   Consult Assessment of nutrition requirement/status (Cachexia, protein cal malnutrition)  ASSESSMENT:   Admitted with abdominal pain, weakness and fatigue x3 weeks. PMH includes HTN, microcytic anemia, osteopenia, HLD, inguinal hernia, perforated gastric ulcer and subsequent pneumoperitoneum s/p omental Graham patch 01/21.  Pt reports that he has no appetite and that he doesn't even like to think about food. Pt reports that he had a dinner roll and ginger ale this morning for breakfast.   Per EMR, pts intake is between 25-50%.  Pt states that he does not care for the Ensure, but said he would drink it for me. Pt was not eating Magic Cup, will d/c. Discussed additional ONS with pt; pt agree about to try Boost Breeze.  Per MD note, plan is for pt to go home with hospice and remain comfortable.  Medications reviewed and include: Augmentin, Reglan, MVI, Oxycodone, Miralax, Senokot Labs reviewed.   Diet Order:   Diet Order             Diet regular Room service appropriate? Yes; Fluid consistency: Thin  Diet effective now                   EDUCATION NEEDS:   No education needs have been identified at  this time  Skin:  Skin Assessment: Reviewed RN Assessment  Last BM:  06/14/2021 - per pt  Height:   Ht Readings from Last 1 Encounters:  06/05/21 5\' 11"  (1.803 m)    Weight:   Wt Readings from Last 1 Encounters:  06/05/21 61.7 kg    Ideal Body Weight:  78.2 kg  BMI:  Body mass index is 18.97 kg/m.  Estimated Nutritional Needs:   Kcal:  1900-2100  Protein:  95-105g  Fluid:  >1.9L    Hermina Barters BS, PLDN Clinical Dietitian See AMiON for contact information.

## 2021-06-14 NOTE — Discharge Instructions (Signed)
IR DRAIN CARE INSTRUCTION   If patient were to be disharged before the drain can be removed in the hospital, our clinic will call patient to set up a follow up appointment for CT scan and possible drain injection at the clinic 1-2 weeks after the discharge.  Out patient follow up orders in.    Out patient discharge instruction for the drain and dressing:    Patient to flush the drain with 5 cc normal saline daily Record output daily (subtract 5 cc that was used to flush the drain from the total daily output)  Dressing change as needed and at least once 2-3 days

## 2021-06-14 NOTE — Progress Notes (Signed)
[facility]) Hospital Liaison RN Note  Visited patient at bedside. No distress noted. Nephew present. Patient continues to await placement. Plan remains for patient to discharge to facility with hospice services.  Hospital liaison will continue to follow for discharge disposition.   Please do not hesitate to call with any hospice related questions.  Bobbie "Loren Racer, RN, BSN [facility] Liaison [phone]

## 2021-06-14 NOTE — Progress Notes (Signed)
Family Medicine Teaching Service Daily Progress Note Intern Pager: [phone]  Patient name: Albert Love record number: [id number] Date of birth: 1941-01-07 Age: 80 y.o. Gender: male  Primary Care Provider: Gladys Damme, MD Consultants: General surgery, GI, palliative care Code Status: DNR  Pt Overview and Major Events to Date:  10/23: admitted 10/24: CT-guided RLQ abscess drain placed 10/25: code status changed to DNR  Assessment and Plan: The patient is a 80 year old male with a history of peptic ulcer disease s/p [name] patch, H. pylori, CRL, prior intra-abdominal abscess with drains, admitted for right lower quadrant mass/abscess.  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  Pain well controlled per patient. Patient experienced some nausea, given Zofran with improvement. Had a large BM as well. Reports feeling much improved this morning.  - Continue Augmentin twice daily (10/27-10/31) - Oxycodone 10 mg every 6 hours scheduled -- Start pepcid given positional nausea -- Reglan 10 mg TID for hiccups  -- Continue Miralax and Senna -- Lab holiday given patient is stable for SNF - Patient to follow-up with IR 10 to 14 days after discharge  IDA Hgb stable around 10. No concern for active bleeding.  -- No follow up labs  Severe protein calorie malnutrition, cachexia Patient reports "no appetite".  - Regular diet - Ensure 3 times daily -- Magic cup 3 times daily - Assistance with meals   FEN/GI: regular diet PPx: SCDs Dispo: SNF, referrals placed by LCSW, stable for D/C  Subjective:  On interview this morning, patient is fully alert and oriented and pleasant to interact with. He reports substantial relief after a large BM. No other complaints this morning.   Objective: Temp:  [97.8 F (36.6 C)-98.1 F (36.7 C)] 97.8 F (36.6 C) (10/31 0755) Pulse Rate:  [86-96] 96 (10/31 0755) Resp:   [17-20] 17 (10/31 0755) BP: (129-159)/(80-91) 144/86 (10/31 0755) SpO2:  [87 %-100 %] 100 % (10/31 0755) Physical Exam Cardiovascular:     Rate and Rhythm: Normal rate and regular rhythm.     Heart sounds: No murmur heard. Pulmonary:     Effort: Pulmonary effort is normal.     Breath sounds: Normal breath sounds.  Abdominal:     General: Bowel sounds are normal.     Palpations: Abdomen is soft.  Neurological:     Mental Status: He is alert and oriented to person, place, and time.     Laboratory: Recent Labs  Lab 06/12/21 0202 06/13/21 0134 06/14/21 0539  WBC 16.2* 13.4* 11.3*  HGB 9.7* 9.9* 9.9*  HCT 31.5* 32.3* 32.7*  PLT 530* 532* 536*   Recent Labs  Lab 06/12/21 0202 06/13/21 0134 06/14/21 0539  NA 135 135 135  K 5.0 4.1 4.4  CL 100 100 98  CO2 29 28 31   BUN 12 11 11   CREATININE 0.79 0.79 0.81  CALCIUM 8.6* 8.7* 9.3  PROT 5.8* 6.0* 6.1*  BILITOT 0.6 0.4 0.6  ALKPHOS 79 77 73  ALT 38 38 40  AST 30 42* 39  GLUCOSE 112* 153* 95    Imaging/Diagnostic Tests: None new   Corky Sox, MD PGY-1 [place] Intern pager: [phone], text pages welcome

## 2021-06-14 NOTE — Progress Notes (Addendum)
FPTS Interim Progress Note  S:Pt is nauseous when trying to lay flat. No vomiting, no hiccups. Notes, his abdomen is more distended than he recalls. Had a bowel movement today.  Continues to not have an appetite but now has burps. Pain is controlled but having tailbone pain.    O: BP (!) 145/85 (BP Location: Right Arm)   Pulse 93   Temp 97.8 F (36.6 C) (Oral)   Resp 17   Ht 5\' 11"  (1.803 m)   Wt 61.7 kg   SpO2 99%   BMI 18.97 kg/m    GEN: pleasant elderly male, sitting on edge of bed  RESP: no increased work of breathing  ABD: mildly distended    A/P:  Trial Simethicone for burping/gas.  Continue Reglan for hiccups.  Hold oxycodone for pain x1 dose while pt is nauseous. Give Zofran 4mg  IV.  - Orders reviewed. Labs for AM ordered, which was adjusted as needed.    Lyndee Hensen, DO 06/14/2021, 1:13 AM PGY-3, [place] [facility] Night Resident  Please page [phone] with questions.

## 2021-06-15 ENCOUNTER — Other Ambulatory Visit: Payer: Self-pay | Admitting: Radiology

## 2021-06-15 DIAGNOSIS — C180 Malignant neoplasm of cecum: Secondary | ICD-10-CM | POA: Diagnosis not present

## 2021-06-15 DIAGNOSIS — K6819 Other retroperitoneal abscess: Secondary | ICD-10-CM | POA: Diagnosis not present

## 2021-06-15 DIAGNOSIS — R404 Transient alteration of awareness: Secondary | ICD-10-CM | POA: Diagnosis not present

## 2021-06-15 DIAGNOSIS — K651 Peritoneal abscess: Secondary | ICD-10-CM | POA: Diagnosis not present

## 2021-06-15 DIAGNOSIS — D75839 Thrombocytosis, unspecified: Secondary | ICD-10-CM | POA: Diagnosis not present

## 2021-06-15 DIAGNOSIS — Z681 Body mass index (BMI) 19 or less, adult: Secondary | ICD-10-CM | POA: Diagnosis not present

## 2021-06-15 DIAGNOSIS — E43 Unspecified severe protein-calorie malnutrition: Secondary | ICD-10-CM | POA: Diagnosis not present

## 2021-06-15 DIAGNOSIS — H349 Unspecified retinal vascular occlusion: Secondary | ICD-10-CM | POA: Diagnosis not present

## 2021-06-15 DIAGNOSIS — K4091 Unilateral inguinal hernia, without obstruction or gangrene, recurrent: Secondary | ICD-10-CM | POA: Diagnosis not present

## 2021-06-15 DIAGNOSIS — Z28310 Unvaccinated for covid-19: Secondary | ICD-10-CM | POA: Diagnosis not present

## 2021-06-15 DIAGNOSIS — D509 Iron deficiency anemia, unspecified: Secondary | ICD-10-CM | POA: Diagnosis not present

## 2021-06-15 DIAGNOSIS — E7800 Pure hypercholesterolemia, unspecified: Secondary | ICD-10-CM | POA: Diagnosis not present

## 2021-06-15 DIAGNOSIS — R109 Unspecified abdominal pain: Secondary | ICD-10-CM | POA: Diagnosis not present

## 2021-06-15 DIAGNOSIS — Z515 Encounter for palliative care: Secondary | ICD-10-CM

## 2021-06-15 DIAGNOSIS — E860 Dehydration: Secondary | ICD-10-CM | POA: Diagnosis not present

## 2021-06-15 DIAGNOSIS — N400 Enlarged prostate without lower urinary tract symptoms: Secondary | ICD-10-CM | POA: Diagnosis present

## 2021-06-15 DIAGNOSIS — Z7401 Bed confinement status: Secondary | ICD-10-CM | POA: Diagnosis not present

## 2021-06-15 DIAGNOSIS — L0291 Cutaneous abscess, unspecified: Secondary | ICD-10-CM | POA: Diagnosis not present

## 2021-06-15 DIAGNOSIS — E878 Other disorders of electrolyte and fluid balance, not elsewhere classified: Secondary | ICD-10-CM | POA: Diagnosis not present

## 2021-06-15 DIAGNOSIS — E785 Hyperlipidemia, unspecified: Secondary | ICD-10-CM | POA: Diagnosis not present

## 2021-06-15 DIAGNOSIS — K5901 Slow transit constipation: Secondary | ICD-10-CM | POA: Diagnosis not present

## 2021-06-15 DIAGNOSIS — M8580 Other specified disorders of bone density and structure, unspecified site: Secondary | ICD-10-CM | POA: Diagnosis not present

## 2021-06-15 DIAGNOSIS — D6959 Other secondary thrombocytopenia: Secondary | ICD-10-CM | POA: Diagnosis not present

## 2021-06-15 DIAGNOSIS — D539 Nutritional anemia, unspecified: Secondary | ICD-10-CM | POA: Diagnosis not present

## 2021-06-15 DIAGNOSIS — R066 Hiccough: Secondary | ICD-10-CM | POA: Diagnosis not present

## 2021-06-15 DIAGNOSIS — A419 Sepsis, unspecified organism: Secondary | ICD-10-CM | POA: Diagnosis not present

## 2021-06-15 DIAGNOSIS — Z0389 Encounter for observation for other suspected diseases and conditions ruled out: Secondary | ICD-10-CM | POA: Diagnosis not present

## 2021-06-15 DIAGNOSIS — R627 Adult failure to thrive: Secondary | ICD-10-CM | POA: Diagnosis not present

## 2021-06-15 DIAGNOSIS — Z743 Need for continuous supervision: Secondary | ICD-10-CM | POA: Diagnosis not present

## 2021-06-15 DIAGNOSIS — E871 Hypo-osmolality and hyponatremia: Secondary | ICD-10-CM | POA: Diagnosis not present

## 2021-06-15 DIAGNOSIS — M899 Disorder of bone, unspecified: Secondary | ICD-10-CM | POA: Diagnosis not present

## 2021-06-15 DIAGNOSIS — U071 COVID-19: Secondary | ICD-10-CM | POA: Diagnosis not present

## 2021-06-15 DIAGNOSIS — R000 Tachycardia, unspecified: Secondary | ICD-10-CM | POA: Diagnosis not present

## 2021-06-15 DIAGNOSIS — K279 Peptic ulcer, site unspecified, unspecified as acute or chronic, without hemorrhage or perforation: Secondary | ICD-10-CM | POA: Diagnosis not present

## 2021-06-15 DIAGNOSIS — D649 Anemia, unspecified: Secondary | ICD-10-CM | POA: Diagnosis not present

## 2021-06-15 DIAGNOSIS — R64 Cachexia: Secondary | ICD-10-CM | POA: Diagnosis not present

## 2021-06-15 DIAGNOSIS — E46 Unspecified protein-calorie malnutrition: Secondary | ICD-10-CM | POA: Diagnosis not present

## 2021-06-15 DIAGNOSIS — Z7189 Other specified counseling: Secondary | ICD-10-CM | POA: Diagnosis not present

## 2021-06-15 DIAGNOSIS — E876 Hypokalemia: Secondary | ICD-10-CM | POA: Diagnosis not present

## 2021-06-15 DIAGNOSIS — D696 Thrombocytopenia, unspecified: Secondary | ICD-10-CM | POA: Diagnosis not present

## 2021-06-15 DIAGNOSIS — C189 Malignant neoplasm of colon, unspecified: Secondary | ICD-10-CM | POA: Diagnosis not present

## 2021-06-15 DIAGNOSIS — I10 Essential (primary) hypertension: Secondary | ICD-10-CM | POA: Diagnosis not present

## 2021-06-15 DIAGNOSIS — Z978 Presence of other specified devices: Secondary | ICD-10-CM | POA: Diagnosis not present

## 2021-06-15 DIAGNOSIS — H3411 Central retinal artery occlusion, right eye: Secondary | ICD-10-CM | POA: Diagnosis not present

## 2021-06-15 DIAGNOSIS — Z66 Do not resuscitate: Secondary | ICD-10-CM | POA: Diagnosis not present

## 2021-06-15 DIAGNOSIS — R531 Weakness: Secondary | ICD-10-CM | POA: Diagnosis not present

## 2021-06-15 DIAGNOSIS — K6389 Other specified diseases of intestine: Secondary | ICD-10-CM | POA: Diagnosis not present

## 2021-06-15 DIAGNOSIS — R0902 Hypoxemia: Secondary | ICD-10-CM | POA: Diagnosis not present

## 2021-06-15 DIAGNOSIS — K6812 Psoas muscle abscess: Secondary | ICD-10-CM | POA: Diagnosis not present

## 2021-06-15 LAB — RESP PANEL BY RT-PCR (FLU A&B, COVID) ARPGX2
Influenza A by PCR: NEGATIVE
Influenza B by PCR: NEGATIVE
SARS Coronavirus 2 by RT PCR: NEGATIVE

## 2021-06-15 LAB — GLUCOSE, CAPILLARY: Glucose-Capillary: 141 mg/dL — ABNORMAL HIGH (ref 70–99)

## 2021-06-15 MED ORDER — FAMOTIDINE 10 MG PO TABS: 10.0000 mg | ORAL_TABLET | Freq: Every day | ORAL | 0 refills | Status: DC

## 2021-06-15 MED ORDER — SENNA 8.6 MG PO TABS
1.0000 | ORAL_TABLET | Freq: Every day | ORAL | Status: DC
  Filled 2021-06-15: qty 1

## 2021-06-15 MED ORDER — ADULT MULTIVITAMIN W/MINERALS CH: 1.0000 | ORAL_TABLET | Freq: Every day | ORAL | Status: DC

## 2021-06-15 MED ORDER — POLYETHYLENE GLYCOL 3350 17 G PO PACK
17.0000 g | PACK | Freq: Every day | ORAL | Status: DC
  Filled 2021-06-15: qty 1

## 2021-06-15 MED ORDER — ENSURE ENLIVE PO LIQD: 237.0000 mL | Freq: Three times a day (TID) | ORAL | 12 refills | Status: DC

## 2021-06-15 MED ORDER — OXYCODONE HCL 10 MG PO TABS: 10.0000 mg | ORAL_TABLET | Freq: Four times a day (QID) | ORAL | 0 refills | Status: DC

## 2021-06-15 MED ORDER — METOCLOPRAMIDE HCL 10 MG PO TABS: 10.0000 mg | ORAL_TABLET | Freq: Three times a day (TID) | ORAL | 0 refills | Status: DC

## 2021-06-15 MED ORDER — METOCLOPRAMIDE HCL 10 MG PO TABS
10.0000 mg | ORAL_TABLET | Freq: Once | ORAL | Status: AC
  Administered 2021-06-15: 10 mg via ORAL
  Filled 2021-06-15: qty 1

## 2021-06-15 NOTE — Progress Notes (Signed)
Occupational Therapy Treatment Patient Details Name: Albert Love MRN: [id number] DOB: May 22, 1941 Today's Date: 06/15/2021   History of present illness 80 y.o. male presenting to ED 10/22 with 3 week Hx of worsening bowel pain, weakness and fatigue. 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   OT comments  Patient received in bed and was agreeable to work with OT on EOB but declined to get into recliner due to patient states too painful. Patient was able to get to eob with min assist and tolerated 15+ minutes sitting on EOB performing reaching tasks.  Patient stood from eob to side step towards head of bed before returning to supine. Acute OT to continue to follow.    Recommendations for follow up therapy are one component of a multi-disciplinary discharge planning process, led by the attending physician.  Recommendations may be updated based on patient status, additional functional criteria and insurance authorization.    Follow Up Recommendations  Acute inpatient rehab (3hours/day)    Assistance Recommended at Discharge Frequent or constant Supervision/Assistance  Equipment Recommendations  BSC;Other (comment)    Recommendations for Other Services Rehab consult    Precautions / Restrictions Precautions Precautions: Fall Restrictions Weight Bearing Restrictions: No       Mobility Bed Mobility Overal bed mobility: Needs Assistance Bed Mobility: Supine to Sit;Sit to Supine   Sidelying to sit: Min assist Supine to sit: Min assist     General bed mobility comments: verbal cues for rail use and technique    Transfers Overall transfer level: Needs assistance Equipment used: 1 person hand held assist Transfers: Sit to/from Stand              General transfer comment: performed one stand from eob with min assist to stand     Balance Overall balance assessment: Needs assistance Sitting-balance support: Bilateral upper extremity supported;No upper extremity supported Sitting balance-Leahy Scale: Fair Sitting balance - Comments: able to perform reaching tasks from eob   Standing balance support: Bilateral upper extremity supported Standing balance-Leahy Scale: Poor Standing balance comment: unable to come to complete stand                           ADL either performed or assessed with clinical judgement   ADL                                               Vision       Perception     Praxis      Cognition Arousal/Alertness: Awake/alert Behavior During Therapy: WFL for tasks assessed/performed Overall Cognitive Status: Within Functional Limits for tasks assessed                                 General Comments: patient able to follow commands and oriented          Exercises Exercises: Other exercises Other Exercises Other Exercises: reaching tasks while seated on eob   Shoulder Instructions       General Comments      Pertinent Vitals/ Pain       Pain  Assessment: Faces Faces Pain Scale: Hurts little more Pain Location: R hip/leg with movement (at drain) Pain Descriptors / Indicators: Aching;Sore Pain Intervention(s): Repositioned  Home Living                                          Prior Functioning/Environment              Frequency  Min 2X/week        Progress Toward Goals  OT Goals(current goals can now be found in the care plan section)  Progress towards OT goals: Progressing toward goals  Acute Rehab OT Goals Patient Stated Goal: not stated OT Goal Formulation: With patient Time For Goal Achievement: 06/21/21 Potential to Achieve Goals: Good ADL Goals Pt Will Perform Eating:  Independently;sitting Pt Will Perform Grooming: with modified independence;standing Pt Will Perform Upper Body Dressing: with modified independence;standing Pt Will Perform Lower Body Dressing: with modified independence;sit to/from stand Pt Will Transfer to Toilet: with modified independence;regular height toilet;ambulating Pt Will Perform Toileting - Clothing Manipulation and hygiene: with modified independence;sit to/from stand Pt Will Perform Tub/Shower Transfer: Shower transfer;grab bars;rolling walker Additional ADL Goal #1: Patient will tolerate static sitting at EOB for 8-10 minutes in prep for ADLs reporting pain <5/10 at R hip.  Plan Discharge plan needs to be updated;Frequency remains appropriate    Co-evaluation                 AM-PAC OT "6 Clicks" Daily Activity     Outcome Measure   Help from another person eating meals?: A Little Help from another person taking care of personal grooming?: A Little Help from another person toileting, which includes using toliet, bedpan, or urinal?: A Lot Help from another person bathing (including washing, rinsing, drying)?: A Lot Help from another person to put on and taking off regular upper body clothing?: A Little Help from another person to put on and taking off regular lower body clothing?: A Lot 6 Click Score: 15    End of Session    OT Visit Diagnosis: Unsteadiness on feet (R26.81);Other abnormalities of gait and mobility (R26.89);Muscle weakness (generalized) (M62.81);Pain Pain - Right/Left: Right Pain - part of body: Hip;Leg   Activity Tolerance Patient limited by pain   Patient Left in bed;with call bell/phone within reach;with bed alarm set   Nurse Communication Mobility status        Time: 8101-7510 OT Time Calculation (min): 27 min  Charges: OT General Charges $OT Visit: 1 Visit OT Treatments $Therapeutic Activity: 23-37 mins  Lodema Hong, [place]  Pager [phone] Office  [place] 06/15/2021, 12:23 PM

## 2021-06-15 NOTE — Progress Notes (Signed)
Jp drain noted to have drainage at the insertion site, charge nurse notified and paged IR, awaiting response as patient is awaiting discharge to facility at any moment.

## 2021-06-15 NOTE — Progress Notes (Addendum)
Referring Physician(s): CCS  Supervising Physician: Corrie Mckusick  Patient Status:  [facility] - In-pt  Chief Complaint:  RLQ abscess s/p RLQ abscess drain placement on 10.24.22 by Dr. Dwaine Gale  Subjective:   Patient alert laying in bed. States " nothing bothers me much" Patient reports an increase in abdominal pain when he lays supine  Allergies: Patient has no known allergies.  Medications: Prior to Admission medications   Medication Sig Start Date End Date Taking? Authorizing Provider  aspirin 81 MG chewable tablet Chew 1 tablet (81 mg total) by mouth daily. 01/21/21  Yes Gladys Damme, MD  atorvastatin (LIPITOR) 80 MG tablet TAKE 1 TABLET (80 MG TOTAL) BY MOUTH DAILY. Patient taking differently: Take 80 mg by mouth daily. 01/21/21 01/21/22 Yes Gladys Damme, MD  ferrous sulfate 325 (65 FE) MG EC tablet Take 1 tablet (325 mg total) by mouth daily with breakfast. 05/12/21  Yes Gatha Mayer, MD  famotidine (PEPCID) 10 MG tablet Take 1 tablet (10 mg total) by mouth daily. 06/16/21   Holley Bouche, MD  feeding supplement (ENSURE ENLIVE / ENSURE PLUS) LIQD Take 237 mLs by mouth 3 (three) times daily between meals. 06/15/21   Holley Bouche, MD  metoCLOPramide (REGLAN) 10 MG tablet Take 1 tablet (10 mg total) by mouth 3 (three) times daily before meals. 06/15/21   Holley Bouche, MD  Multiple Vitamin (MULTIVITAMIN WITH MINERALS) TABS tablet Take 1 tablet by mouth daily. 06/16/21   Holley Bouche, MD  oxyCODONE 10 MG TABS Take 1 tablet (10 mg total) by mouth every 6 (six) hours. 06/15/21   Holley Bouche, MD     Vital Signs: BP 116/80 (BP Location: Left Arm)   Pulse 87   Temp 97.7 F (36.5 C) (Oral)   Resp 17   Ht 5\' 11"  (1.803 m)   Wt 136 lb (61.7 kg)   SpO2 93%   BMI 18.97 kg/m   Physical Exam Vitals and nursing note reviewed.  Constitutional:      Appearance: He is well-developed.  HENT:     Head: Normocephalic.  Pulmonary:     Effort: Pulmonary effort is normal.   Abdominal:     Comments: Positive RLQ abscess drain Output is tan purulent in nature 20 ml's noted to be in the JP drain. Dressing c/d/I Suture and stat lock in place.  Drain is able to be flushed easily.     Musculoskeletal:        General: Normal range of motion.     Cervical back: Normal range of motion.  Skin:    General: Skin is dry.  Neurological:     Mental Status: He is alert and oriented to person, place, and time.    Imaging: DG Abd 1 View  Result Date: 06/12/2021 CLINICAL DATA:  Constipation. EXAM: ABDOMEN - 1 VIEW COMPARISON:  June 06, 2021. FINDINGS: Dilated small bowel loops are noted concerning for distal small bowel obstruction or possibly ileus. Percutaneous drainage catheter is noted in right lower quadrant. No colonic dilatation is noted. No significant stool burden is noted. Phleboliths are noted in the pelvis. IMPRESSION: Moderately dilated small bowel loops are noted concerning for distal small bowel obstruction or possibly ileus. Percutaneous drainage catheter is noted in right lower quadrant. Electronically Signed   By: Marijo Conception M.D.   On: 06/12/2021 11:41    Labs:  CBC: Recent Labs    06/11/21 0103 06/12/21 0202 06/13/21 0134 06/14/21 0539  WBC 16.3* 16.2* 13.4* 11.3*  HGB  10.8* 9.7* 9.9* 9.9*  HCT 35.1* 31.5* 32.3* 32.7*  PLT 561* 530* 532* 536*    COAGS: Recent Labs    10/19/20 1350  INR 1.1  APTT 38*    BMP: Recent Labs    06/11/21 0103 06/12/21 0202 06/13/21 0134 06/14/21 0539  NA 136 135 135 135  K 5.0 5.0 4.1 4.4  CL 99 100 100 98  CO2 26 29 28 31   GLUCOSE 108* 112* 153* 95  BUN 11 12 11 11   CALCIUM 8.7* 8.6* 8.7* 9.3  CREATININE 0.87 0.79 0.79 0.81  GFRNONAA >60 >60 >60 >60    LIVER FUNCTION TESTS: Recent Labs    06/11/21 0103 06/12/21 0202 06/13/21 0134 06/14/21 0539  BILITOT 0.6 0.6 0.4 0.6  AST 45* 30 42* 39  ALT 50* 38 38 40  ALKPHOS 93 79 77 73  PROT 6.0* 5.8* 6.0* 6.1*  ALBUMIN 1.9* 1.9*  2.0* 2.2*    Assessment and Plan:  Drain Location: RLQ Size: Fr size: 10 Fr Date of placement:  10.24.22  Currently to: Drain collection device: suction bulb 24 hour output:  Output by Drain (mL) 06/13/21 0701 - 06/13/21 1900 06/13/21 1901 - 06/14/21 0700 06/14/21 0701 - 06/14/21 1900 06/14/21 1901 - 06/15/21 0700 06/15/21 0701 - 06/15/21 1442  Closed System Drain 1 RLQ Bulb (JP) 10 Fr.  50 20 0   Output is tan purulent in nature 20 ml's noted to be in the JP drain. WBC 11.3 - down trending Gram stains shows abundant WBC PMN and mononuclear with gram positive cocci.  Culture abundant strep constellatus, moderate haemophilus parainfluenzae and beta lactamse positive  Interval imaging/drain manipulation:  None since placement  Current examination: Flushes/aspirates easily.  Insertion site unremarkable. Suture and stat lock in place. Dressed appropriately.   Plan: Continue TID flushes with 5 cc NS. Record output Q shift. Dressing changes QD or PRN if soiled.  Call IR APP or on call IR MD if difficulty flushing or sudden change in drain output.  Repeat imaging/possible drain injection once output < 10 mL/QD (excluding flush material.)  Discharge planning: Please contact IR APP or on call IR MD prior to patient d/c to ensure appropriate follow up plans are in place. Typically patient will follow up with IR clinic 10-14 days post d/c for repeat imaging/possible drain injection. IR scheduler will contact patient with date/time of appointment. Patient will need to flush drain QD with 5 cc NS, record output QD, dressing changes every 2-3 days or earlier if soiled.   IR will continue to follow - please call with questions or concerns.   Electronically Signed: Jacqualine Mau, NP 06/15/2021, 2:42 PM   I spent a total of 15 Minutes at the the patient's bedside AND on the patient's hospital floor or unit, greater than 50% of which was counseling/coordinating care for RLq abscess  drain

## 2021-06-15 NOTE — Progress Notes (Signed)
This chaplain responded to MD consult to assist the Pt. in completing the Pt. Living Will.  The Pt. Is awake and preparing to eat breakfast at the time of the visit.  The chaplain understands from communication with the Pt., the Pt. HCPOA-Kenny Sherral Hammers has the authority to make decisions about EOL care. The Pt. reminded the chaplain his choices are also documented in his MOST form.  This chaplain is available for F/U spiritual care as needed.  Chaplain Sallyanne Kuster [phone]

## 2021-06-15 NOTE — Progress Notes (Signed)
Manufacturing engineer [facility])  Hospital Liaison RN note        Pt had been deemed eligible for hospice services but now will be discharging to SNF for rehab. Per TOC pt will need palliative services at [facility] after discharge.              [facility] team will follow up with patient after discharge.         Please call with any hospice or palliative related questions.         Thank you for the opportunity to participate in this patient's care.     Domenic Moras, BSN, RN [facility] Liaison (listed on [place] under Hospice/[facility])    [phone] (24h on call)

## 2021-06-15 NOTE — Progress Notes (Signed)
Sbar received, patient lying in bed with eyes closed, resp even and unlabored, no s/s of distress noted, call bell within reach, bed in lowest and locked position, no concerns noted at this time.

## 2021-06-15 NOTE — Progress Notes (Signed)
Sbar provided to [employer], lpn at [facility] facility, all questions and concerns addressed, no concerns noted, pt anticipated pickup via ptar at 1530.

## 2021-06-15 NOTE — Discharge Summary (Signed)
[facility] Discharge Summary  Patient name: Albert Love record number: [id number] Date of birth: 12-Jun-1941 Age: 80 y.o. Gender: male Date of Admission: 06/05/2021  Date of Discharge: 06/15/2021 Admitting Physician: Ezequiel Essex, MD  Primary Care Provider: Gladys Damme, MD Consultants: General surgery, GI, palliative care  Indication for Hospitalization: [name] is a 80 year old male with a history of peptic ulcer disease s/p [name] patch, H. 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  Discharge Diagnoses/Problem List:  Principal Problem:   Colonic mass Active Problems:   Protein-calorie malnutrition, severe   Abscess   Constipation   Failure to thrive in adult   Hospice care patient   Palliative care patient   Disposition: [facility]; patient will be followed by [facility] palliative in the facility  Discharge Condition: Stable  Discharge Exam:  General: Age congruent with a gray beard, appropriately groomed, cachectic, pleasant Cardiovascular: Rate and rhythm: Normal rate and regular rhythm Heart sounds: Regular heart sounds, no S3 and no S4, no murmur appreciated Pulmonary: Effort: Pulmonary effort was normal Breath sounds: Normal breath sounds Abdominal: Palpations: Abdomen is soft, with tenderness to palpation in RLQ Neurological: Mental status: He is alert and oriented to person, place, and time.   Brief Hospital Course:  Albert Love is a 80 y.o. male presenting with 3-week history of worsening bowel pain, weakness, and fatigue. 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   Colonic mass   Abdominal pain  Leukocytosis This patient presents to the ED with a 3-week history of worsening abdominal pain, weakness, fatigue, and constipation. On presentation to the ED, he was noted to be afebrile, tachycardic to 108, and normotensive.  Labs notable for albumin 2.2, alk phos 216, WBC 30.5, hemoglobin 10.1, MCV 67.6, platelets 577.  CT imaging concerning for colonic/cecal mass, most likely malignant but also possibly infectious in nature.  Did receive Zosyn in ED, unfortunately no blood cultures collected prior to administration.  He has a complicated abdominal history, discussed below.  Was hospitalized in January 2021 and underwent a handful of abdominal surgeries.  At that time, was also noted to have thickening of the inferior portion of the cecum, unsure whether inflammatory or neoplastic.  He was lost to GI follow-up and did not get repeat endoscopy and H. pylori testing as planned.  Has been seeing his PCP, who in July noted his worsening microcytic anemia of unknown etiology and encouraged him to set up appointment with GI.  Had appointment scheduled with GI for the end of this month, however he came into the ED yesterday for worsening pain.  At the top top differential diagnosis includes colonic malignancy, but differentials also include abscess.  Consulted GI, they will perform colonoscopy tomorrow (Monday 10/24). - admit to med surg floor with Dr. Owens Shark attending - consult GI, appreciate recommendations -Clear liquid diet - N.p.o. at midnight in preparation for colonoscopy Monday - PT/OT eval and treat - follow up magnesium, GGT, CMP, CBC at 12 PM - AM CBC, CMP, magnesium - Delirium precautions   Hx perforated gastric ulcer s/p omental Graham patch 09/05/2019  Pneumoperitoneum Hospitalized January 2021 with perforated pyloric ulcer and diffuse purulent peritonitis.  Diagnosed with H. pylori. Underwent exploratory laparotomy, gastric biopsy, omental pedicel flap [name]  patch) repair of  perforated pyloric ulcer on 09/05/2019 with Dr. Jens Som. Subsequently underwent IR drain placement x2 on 09/14/2019, removed 09/19/2019.  During this admission, was noted to have thickening of the inferior portion of the cecum, unsure whether to be inflammatory or neoplastic. - Management as discussed above - Consult GI, appreciate evaluation and management   Cachexia  Hypoalbuminemia secondary to protein calorie malnutrition  Admission albumin 2.2. Osteopenia noted on CT imaging. Cachexia apparent on physical exam.  - Consult nutrition for dietary recommendations - Daily multivitamin - Ensure twice daily between meals - Possibility of refeeding syndrome, will need daily CMP and mag checks   Hypertension BP on admission normotensive, last 130/72.  Patient is intermittently tachycardic up to 108.  Has a documented history of hypertension while hospitalized in early 2022.  Was originally started on amlodipine, but PCP notes throughout the year indicate intermittent compliance.  On admission, patient cannot tell me what medications he currently takes.  Given normotension on admission, declined to start any antihypertensive at this time. - Vitals per unit routine   Microcytic anemia Admission Hgb 10.1, MCV 67.6. Chart review indicates in last year, hemoglobin has been 11-12  and MCV mid-80s but slowly down-trending.  B12 and ferritin within normal limits (291, 14.9 respectively) when checked by GI on 05/12/2021.  Chart review indicates he reported some dark stools and constipation to PCP in July.  On admission, patient reports constipation but denies blood per rectum or dark stools at this time. - Daily CBC - Continue home iron supplementation s/p colonoscopy   Thrombocytopenia Platelets this morning 577.  In the last two years, has ranged 147-447; however since 11/02/2019 has slowly trended upwards.  Possibly related to colonic mass discussed above. - Daily CBC   Central retinal artery  occlusion of right eye  HLD Is prescribed aspirin 80 mg daily and atorvastatin 80 mg daily; PCP visit notes indicate intermittent adherence.  Had appointment with ophthalmologist in September.  Patient cannot remember what meds he is taking or whether he has missed any lately. - Continue atorvastatin 80 mg daily - Hold aspirin 81 mg daily   Right inguinal hernia Noted at PCP visit June 2022.  Couple months duration.  Easily reducible.  Will monitor throughout this admission.   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  Wonder if pelvic lesions related to cecal mass.  We will continue to follow on imaging.   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 Previous CT abd/pelv 09/17/2019 indicate severely enlarged prostate gland which is stable.  Patient reports urinating normally and without issue.  Will suggest outpatient follow-up.   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  Follow-up 10 to 14 days with IR clinic. D/C reglan after 12 wks   Significant Procedures: IR drain placement, 10/24  Significant Labs and Imaging:  Recent Labs  Lab 06/12/21 0202 06/13/21 0134 06/14/21 0539  WBC 16.2* 13.4* 11.3*  HGB 9.7* 9.9* 9.9*  HCT 31.5* 32.3* 32.7*  PLT 530* 532* 536*   Recent Labs  Lab 06/09/21 0230 06/10/21 0140 06/11/21 0103  06/12/21 0202 06/13/21 0134 06/14/21 0539  NA 138 134* 136 135 135 135  K 3.8 4.0 5.0 5.0 4.1 4.4  CL 105 102 99 100 100 98  CO2 26 26 26 29 28 31   GLUCOSE  110* 126* 108* 112* 153* 95  BUN 11 10 11 12 11 11   CREATININE 0.84 0.69 0.87 0.79 0.79 0.81  CALCIUM 7.9* 7.8* 8.7* 8.6* 8.7* 9.3  MG 1.9  --   --   --   --   --   ALKPHOS 91 78 93 79 77 73  AST 123* 92* 45* 30 42* 39  ALT 65* 61* 50* 38 38 40  ALBUMIN 1.5* 1.6* 1.9* 1.9* 2.0* 2.2*      Results/Tests Pending at Time of Discharge: None  Discharge Medications:  Allergies as of 06/15/2021   No Known Allergies      Medication List     STOP taking these medications    atorvastatin 80 MG tablet Commonly known as: LIPITOR       TAKE these medications    aspirin 81 MG chewable tablet Chew 1 tablet (81 mg total) by mouth daily.   famotidine 10 MG tablet Commonly known as: PEPCID Take 1 tablet (10 mg total) by mouth daily. Start taking on: June 16, 2021   feeding supplement Liqd Take 237 mLs by mouth 3 (three) times daily between meals.   ferrous sulfate 325 (65 FE) MG EC tablet Take 1 tablet (325 mg total) by mouth daily with breakfast.   metoCLOPramide 10 MG tablet Commonly known as: REGLAN Take 1 tablet (10 mg total) by mouth 3 (three) times daily before meals.   multivitamin with minerals Tabs tablet Take 1 tablet by mouth daily. Start taking on: June 16, 2021   Oxycodone HCl 10 MG Tabs Take 1 tablet (10 mg total) by mouth every 6 (six) hours.        Discharge Instructions: Please refer to Patient Instructions section of EMR for full details.  Patient was counseled important signs and symptoms that should prompt return to medical care, changes in medications, dietary instructions, activity restrictions, and follow up appointments.   Follow-Up Appointments:  Follow-up Information     Mir, Paula Libra, MD Follow up.   Specialties: Interventional Radiology, Diagnostic Radiology, Radiology Why: Drain follow up visit with CT scan and possible drain injection 10-14 days after discharge. Our office will call you to set up the appointment. Contact  information: [place] [address] [phone]                 Rosezetta Schlatter, MD 06/15/2021, 12:36 PM PGY-1, [place]

## 2021-06-15 NOTE — TOC Transition Note (Signed)
Transition of Care [facility]) - CM/SW Discharge Note   Patient Details  Name: Weaver Tweed MRN: [id number] Date of Birth: 03/09/1941  Transition of Care [facility]) CM/SW Contact:  Benard Halsted, LCSW Phone Number: 06/15/2021, 12:49 PM   Clinical Narrative:    Patient will DC to: [facility] Anticipated DC date: 06/15/21 Family notified: Delila Spence Transport by: Corey Harold   Per MD patient ready for DC to [facility]. RN to call report prior to discharge [phone]). RN, patient, patient's family, and facility notified of DC. Discharge Summary and FL2 sent to facility. DC packet on chart. Ambulance transport requested for patient.   CSW will sign off for now as social work intervention is no longer needed. Please consult [place] again if new needs arise.     Final next level of care: Skilled Nursing Facility Barriers to Discharge: Barriers Resolved   Patient Goals and CMS Choice Patient states their goals for this hospitalization and ongoing recovery are:: Return home CMS Medicare.gov Compare Post Acute Care list provided to:: Patient Represenative (must comment) (Nephew, [name]) Choice offered to / list presented to : Patient (patient and nephew)  Discharge Placement   Existing PASRR number confirmed : 06/15/21          Patient chooses bed at: [facility] Patient to be transferred to facility by: [place] Name of family member notified: Nephew Patient and family notified of of transfer: 06/15/21  Discharge Plan and Services   Discharge Planning Services: CM Consult                                 Social Determinants of Health ([place]) Interventions     Readmission Risk Interventions No flowsheet data found.

## 2021-06-15 NOTE — Progress Notes (Signed)
Patient ID: Albert Love, male   DOB: 1940-10-03, 80 y.o.   MRN: [id number]  Called about drainage around the drainage catheter.   I examined the drain at bedside and the drain is intact.  There was no active drainage around the tube.  I flushed the drain with saline without difficulty.  No drainage around the tube with flushes.  Opaque yellow fluid was easily aspirated from the drain and fluid is draining back into the suction bulb.  The drain may have been partially occluded but it seems to be functioning well now.  Patient is getting discharged to a skilled nursing facility and would recommend continuing routine flushes.

## 2021-06-15 NOTE — Progress Notes (Signed)
FPTS Brief Progress Note  S: Pt is currently working on a bowel movement. Had had several stools today. Denies pain and hiccups.    O: BP 130/78 (BP Location: Left Arm)   Pulse 80   Temp 98.9 F (37.2 C) (Oral)   Resp 16   Ht 5\' 11"  (1.803 m)   Wt 61.7 kg   SpO2 100%   BMI 18.97 kg/m    GEN: pleasant elderly male, in no acute distress    A/P: Constipation  Resolving. Decreased Miralax to 17g daily and Senna to daily.  Pt had multiple bowel movements throughout the day. He is eating well. Per RN, ate 100% of one of his meals today.  - Orders reviewed. Labs for AM not ordered, which was adjusted as needed.    Lyndee Hensen, DO 06/15/2021, 12:59 AM PGY-3, [facility] Night Resident  Please page [phone] with questions.

## 2021-06-15 NOTE — Progress Notes (Signed)
Patient discharged to facility. Transported via EMS.

## 2021-06-15 NOTE — TOC Progression Note (Addendum)
Transition of Care [facility]) - Progression Note    Patient Details  Name: Albert Love MRN: [id number] Date of Birth: September 02, 1940  Transition of Care [facility]) CM/SW [place], LCSW Phone Number: 06/15/2021, 9:30 AM  Clinical Narrative:    9:30am-CSW received word from [facility] that they can accept patient today pending COVID test as it was not collected yesterday as ordered. Charge RN to order rapid test.  CSW received insurance approval: Ref# [id number], effective 06/15/2021-06/17/2021.  CSW will notify [facility] to have palliative services follow patient at SNF. CSW left voicemail for patient's nephew, [name].   11am-CSW received call back from [facility]. He asked for information on how to help patient after rehab. CSW encouraged him to apply for Medicaid for patient. He asked if the [place] could provide any help. CSW left voicemail for [facility] transfer Coordinator to see if patient is eligible for any services.   CSW updated patient on plan and he is agreeable.   1:15pm-CSW spoke with the [facility] Lavella Lemons [place] [phone] x [phone]). They stated patient does not have a provider through them though he is eligible for one. CSW inquired if he is eligible for SNF or aide services through them if he were to get a PCP. Lavella Lemons reported that patient is not service connected so he would not be eligible for SNF but he could receive 16 hours of nursing a week in the home. She will place order for them to reach out to patient's nephew to schedule a PCP visit to begin the process.       Expected Discharge Plan: Skilled Nursing Facility Barriers to Discharge: Barriers Resolved  Expected Discharge Plan and Services Expected Discharge Plan: [place]   Discharge Planning Services: CM Consult   Living arrangements for the past 2 months: Apartment                                       Social Determinants of Health (SDOH) Interventions    Readmission Risk Interventions No  flowsheet data found.

## 2021-06-16 ENCOUNTER — Other Ambulatory Visit: Payer: Self-pay | Admitting: Physician Assistant

## 2021-06-16 DIAGNOSIS — H349 Unspecified retinal vascular occlusion: Secondary | ICD-10-CM | POA: Diagnosis not present

## 2021-06-16 DIAGNOSIS — D75839 Thrombocytosis, unspecified: Secondary | ICD-10-CM | POA: Diagnosis not present

## 2021-06-16 DIAGNOSIS — R627 Adult failure to thrive: Secondary | ICD-10-CM | POA: Diagnosis not present

## 2021-06-16 DIAGNOSIS — R64 Cachexia: Secondary | ICD-10-CM | POA: Diagnosis not present

## 2021-06-16 DIAGNOSIS — K6389 Other specified diseases of intestine: Secondary | ICD-10-CM | POA: Diagnosis not present

## 2021-06-16 DIAGNOSIS — K279 Peptic ulcer, site unspecified, unspecified as acute or chronic, without hemorrhage or perforation: Secondary | ICD-10-CM | POA: Diagnosis not present

## 2021-06-16 DIAGNOSIS — M899 Disorder of bone, unspecified: Secondary | ICD-10-CM | POA: Diagnosis not present

## 2021-06-16 DIAGNOSIS — D649 Anemia, unspecified: Secondary | ICD-10-CM | POA: Diagnosis not present

## 2021-06-16 DIAGNOSIS — I10 Essential (primary) hypertension: Secondary | ICD-10-CM | POA: Diagnosis not present

## 2021-06-16 DIAGNOSIS — L0291 Cutaneous abscess, unspecified: Secondary | ICD-10-CM

## 2021-06-16 DIAGNOSIS — E46 Unspecified protein-calorie malnutrition: Secondary | ICD-10-CM | POA: Diagnosis not present

## 2021-06-16 DIAGNOSIS — M8580 Other specified disorders of bone density and structure, unspecified site: Secondary | ICD-10-CM | POA: Diagnosis not present

## 2021-06-19 ENCOUNTER — Other Ambulatory Visit: Payer: Self-pay

## 2021-06-19 ENCOUNTER — Emergency Department (HOSPITAL_COMMUNITY): Payer: Medicare Other

## 2021-06-19 ENCOUNTER — Encounter (HOSPITAL_COMMUNITY): Payer: Self-pay

## 2021-06-19 ENCOUNTER — Inpatient Hospital Stay (HOSPITAL_COMMUNITY)
Admission: EM | Admit: 2021-06-19 | Discharge: 2021-06-30 | DRG: 871 | Disposition: A | Discharge status: Home Health | Payer: Medicare Other | Source: Skilled Nursing Facility | Attending: Family Medicine | Admitting: Family Medicine

## 2021-06-19 DIAGNOSIS — I10 Essential (primary) hypertension: Secondary | ICD-10-CM | POA: Diagnosis present

## 2021-06-19 DIAGNOSIS — C189 Malignant neoplasm of colon, unspecified: Secondary | ICD-10-CM | POA: Diagnosis not present

## 2021-06-19 DIAGNOSIS — E876 Hypokalemia: Secondary | ICD-10-CM | POA: Diagnosis present

## 2021-06-19 DIAGNOSIS — E43 Unspecified severe protein-calorie malnutrition: Secondary | ICD-10-CM | POA: Diagnosis not present

## 2021-06-19 DIAGNOSIS — E860 Dehydration: Secondary | ICD-10-CM | POA: Diagnosis present

## 2021-06-19 DIAGNOSIS — R109 Unspecified abdominal pain: Secondary | ICD-10-CM | POA: Diagnosis not present

## 2021-06-19 DIAGNOSIS — Z0000 Encounter for general adult medical examination without abnormal findings: Secondary | ICD-10-CM

## 2021-06-19 DIAGNOSIS — D649 Anemia, unspecified: Secondary | ICD-10-CM | POA: Diagnosis not present

## 2021-06-19 DIAGNOSIS — C180 Malignant neoplasm of cecum: Secondary | ICD-10-CM | POA: Diagnosis not present

## 2021-06-19 DIAGNOSIS — Z7189 Other specified counseling: Secondary | ICD-10-CM | POA: Diagnosis not present

## 2021-06-19 DIAGNOSIS — A419 Sepsis, unspecified organism: Secondary | ICD-10-CM | POA: Diagnosis not present

## 2021-06-19 DIAGNOSIS — M8580 Other specified disorders of bone density and structure, unspecified site: Secondary | ICD-10-CM | POA: Diagnosis present

## 2021-06-19 DIAGNOSIS — R000 Tachycardia, unspecified: Secondary | ICD-10-CM | POA: Diagnosis not present

## 2021-06-19 DIAGNOSIS — Z681 Body mass index (BMI) 19 or less, adult: Secondary | ICD-10-CM | POA: Diagnosis not present

## 2021-06-19 DIAGNOSIS — K651 Peritoneal abscess: Secondary | ICD-10-CM | POA: Diagnosis not present

## 2021-06-19 DIAGNOSIS — R066 Hiccough: Secondary | ICD-10-CM | POA: Diagnosis not present

## 2021-06-19 DIAGNOSIS — D539 Nutritional anemia, unspecified: Secondary | ICD-10-CM | POA: Diagnosis present

## 2021-06-19 DIAGNOSIS — Z28310 Unvaccinated for covid-19: Secondary | ICD-10-CM | POA: Diagnosis not present

## 2021-06-19 DIAGNOSIS — D490 Neoplasm of unspecified behavior of digestive system: Secondary | ICD-10-CM | POA: Diagnosis not present

## 2021-06-19 DIAGNOSIS — N400 Enlarged prostate without lower urinary tract symptoms: Secondary | ICD-10-CM | POA: Diagnosis present

## 2021-06-19 DIAGNOSIS — K6812 Psoas muscle abscess: Secondary | ICD-10-CM | POA: Diagnosis present

## 2021-06-19 DIAGNOSIS — H3411 Central retinal artery occlusion, right eye: Secondary | ICD-10-CM | POA: Diagnosis not present

## 2021-06-19 DIAGNOSIS — N289 Disorder of kidney and ureter, unspecified: Secondary | ICD-10-CM

## 2021-06-19 DIAGNOSIS — K4090 Unilateral inguinal hernia, without obstruction or gangrene, not specified as recurrent: Secondary | ICD-10-CM

## 2021-06-19 DIAGNOSIS — H5461 Unqualified visual loss, right eye, normal vision left eye: Secondary | ICD-10-CM | POA: Diagnosis present

## 2021-06-19 DIAGNOSIS — R627 Adult failure to thrive: Secondary | ICD-10-CM | POA: Diagnosis not present

## 2021-06-19 DIAGNOSIS — F17210 Nicotine dependence, cigarettes, uncomplicated: Secondary | ICD-10-CM | POA: Diagnosis present

## 2021-06-19 DIAGNOSIS — D6959 Other secondary thrombocytopenia: Secondary | ICD-10-CM | POA: Diagnosis present

## 2021-06-19 DIAGNOSIS — R197 Diarrhea, unspecified: Secondary | ICD-10-CM | POA: Diagnosis not present

## 2021-06-19 DIAGNOSIS — E785 Hyperlipidemia, unspecified: Secondary | ICD-10-CM | POA: Diagnosis present

## 2021-06-19 DIAGNOSIS — R159 Full incontinence of feces: Secondary | ICD-10-CM | POA: Diagnosis present

## 2021-06-19 DIAGNOSIS — D509 Iron deficiency anemia, unspecified: Secondary | ICD-10-CM | POA: Diagnosis present

## 2021-06-19 DIAGNOSIS — K5900 Constipation, unspecified: Secondary | ICD-10-CM | POA: Diagnosis present

## 2021-06-19 DIAGNOSIS — Z751 Person awaiting admission to adequate facility elsewhere: Secondary | ICD-10-CM

## 2021-06-19 DIAGNOSIS — D72829 Elevated white blood cell count, unspecified: Secondary | ICD-10-CM

## 2021-06-19 DIAGNOSIS — R64 Cachexia: Secondary | ICD-10-CM | POA: Diagnosis present

## 2021-06-19 DIAGNOSIS — A048 Other specified bacterial intestinal infections: Secondary | ICD-10-CM

## 2021-06-19 DIAGNOSIS — T85638A Leakage of other specified internal prosthetic devices, implants and grafts, initial encounter: Secondary | ICD-10-CM | POA: Diagnosis not present

## 2021-06-19 DIAGNOSIS — Z66 Do not resuscitate: Secondary | ICD-10-CM | POA: Diagnosis not present

## 2021-06-19 DIAGNOSIS — K6389 Other specified diseases of intestine: Secondary | ICD-10-CM | POA: Diagnosis not present

## 2021-06-19 DIAGNOSIS — E878 Other disorders of electrolyte and fluid balance, not elsewhere classified: Secondary | ICD-10-CM | POA: Diagnosis present

## 2021-06-19 DIAGNOSIS — U071 COVID-19: Secondary | ICD-10-CM | POA: Diagnosis not present

## 2021-06-19 DIAGNOSIS — R0902 Hypoxemia: Secondary | ICD-10-CM | POA: Diagnosis not present

## 2021-06-19 DIAGNOSIS — E46 Unspecified protein-calorie malnutrition: Secondary | ICD-10-CM | POA: Diagnosis not present

## 2021-06-19 DIAGNOSIS — Z515 Encounter for palliative care: Secondary | ICD-10-CM | POA: Diagnosis not present

## 2021-06-19 DIAGNOSIS — E871 Hypo-osmolality and hyponatremia: Secondary | ICD-10-CM | POA: Diagnosis not present

## 2021-06-19 DIAGNOSIS — R531 Weakness: Secondary | ICD-10-CM | POA: Diagnosis not present

## 2021-06-19 DIAGNOSIS — Z8711 Personal history of peptic ulcer disease: Secondary | ICD-10-CM

## 2021-06-19 DIAGNOSIS — Z743 Need for continuous supervision: Secondary | ICD-10-CM | POA: Diagnosis not present

## 2021-06-19 DIAGNOSIS — Z978 Presence of other specified devices: Secondary | ICD-10-CM | POA: Diagnosis not present

## 2021-06-19 DIAGNOSIS — I700 Atherosclerosis of aorta: Secondary | ICD-10-CM | POA: Diagnosis not present

## 2021-06-19 DIAGNOSIS — L0291 Cutaneous abscess, unspecified: Secondary | ICD-10-CM

## 2021-06-19 DIAGNOSIS — D689 Coagulation defect, unspecified: Secondary | ICD-10-CM

## 2021-06-19 DIAGNOSIS — K6819 Other retroperitoneal abscess: Secondary | ICD-10-CM | POA: Diagnosis present

## 2021-06-19 DIAGNOSIS — K668 Other specified disorders of peritoneum: Secondary | ICD-10-CM

## 2021-06-19 DIAGNOSIS — D75839 Thrombocytosis, unspecified: Secondary | ICD-10-CM | POA: Diagnosis present

## 2021-06-19 DIAGNOSIS — C762 Malignant neoplasm of abdomen: Secondary | ICD-10-CM | POA: Diagnosis present

## 2021-06-19 DIAGNOSIS — Z0389 Encounter for observation for other suspected diseases and conditions ruled out: Secondary | ICD-10-CM | POA: Diagnosis not present

## 2021-06-19 DIAGNOSIS — R32 Unspecified urinary incontinence: Secondary | ICD-10-CM | POA: Diagnosis present

## 2021-06-19 DIAGNOSIS — E8809 Other disorders of plasma-protein metabolism, not elsewhere classified: Secondary | ICD-10-CM | POA: Diagnosis present

## 2021-06-19 LAB — CBC WITH DIFFERENTIAL/PLATELET
Abs Immature Granulocytes: 0.08 10*3/uL — ABNORMAL HIGH (ref 0.00–0.07)
Basophils Absolute: 0 10*3/uL (ref 0.0–0.1)
Basophils Relative: 0 %
Eosinophils Absolute: 0 10*3/uL (ref 0.0–0.5)
Eosinophils Relative: 0 %
HCT: 31.9 % — ABNORMAL LOW (ref 39.0–52.0)
Hemoglobin: 9.9 g/dL — ABNORMAL LOW (ref 13.0–17.0)
Immature Granulocytes: 1 %
Lymphocytes Relative: 10 %
Lymphs Abs: 1.2 10*3/uL (ref 0.7–4.0)
MCH: 20.8 pg — ABNORMAL LOW (ref 26.0–34.0)
MCHC: 31 g/dL (ref 30.0–36.0)
MCV: 67.2 fL — ABNORMAL LOW (ref 80.0–100.0)
Monocytes Absolute: 1.1 10*3/uL — ABNORMAL HIGH (ref 0.1–1.0)
Monocytes Relative: 9 %
Neutro Abs: 10 10*3/uL — ABNORMAL HIGH (ref 1.7–7.7)
Neutrophils Relative %: 80 %
Platelets: 517 10*3/uL — ABNORMAL HIGH (ref 150–400)
RBC: 4.75 MIL/uL (ref 4.22–5.81)
RDW: 19.4 % — ABNORMAL HIGH (ref 11.5–15.5)
WBC: 12.4 10*3/uL — ABNORMAL HIGH (ref 4.0–10.5)
nRBC: 0 % (ref 0.0–0.2)

## 2021-06-19 LAB — COMPREHENSIVE METABOLIC PANEL
ALT: 21 U/L (ref 0–44)
AST: 21 U/L (ref 15–41)
Albumin: 2.1 g/dL — ABNORMAL LOW (ref 3.5–5.0)
Alkaline Phosphatase: 69 U/L (ref 38–126)
Anion gap: 10 (ref 5–15)
BUN: 6 mg/dL — ABNORMAL LOW (ref 8–23)
CO2: 25 mmol/L (ref 22–32)
Calcium: 8.3 mg/dL — ABNORMAL LOW (ref 8.9–10.3)
Chloride: 97 mmol/L — ABNORMAL LOW (ref 98–111)
Creatinine, Ser: 0.66 mg/dL (ref 0.61–1.24)
GFR, Estimated: >60 mL/min (ref >60)
Glucose, Bld: 98 mg/dL (ref 70–99)
Potassium: 4.2 mmol/L (ref 3.5–5.1)
Sodium: 132 mmol/L — ABNORMAL LOW (ref 135–145)
Total Bilirubin: 1.1 mg/dL (ref 0.3–1.2)
Total Protein: 6 g/dL — ABNORMAL LOW (ref 6.5–8.1)

## 2021-06-19 LAB — LACTIC ACID, PLASMA: Lactic Acid, Venous: 1.6 mmol/L (ref 0.5–1.9)

## 2021-06-19 LAB — RESP PANEL BY RT-PCR (FLU A&B, COVID) ARPGX2
Influenza A by PCR: NEGATIVE
Influenza B by PCR: NEGATIVE
SARS Coronavirus 2 by RT PCR: NEGATIVE

## 2021-06-19 LAB — PROTIME-INR
INR: 1.4 — ABNORMAL HIGH (ref 0.8–1.2)
Prothrombin Time: 17.2 seconds — ABNORMAL HIGH (ref 11.4–15.2)

## 2021-06-19 MED ORDER — ACETAMINOPHEN 325 MG PO TABS
650.0000 mg | ORAL_TABLET | Freq: Four times a day (QID) | ORAL | Status: DC | PRN
  Administered 2021-06-20 – 2021-06-21 (×3): 650 mg via ORAL
  Filled 2021-06-19 (×4): qty 2

## 2021-06-19 MED ORDER — FENTANYL CITRATE PF 50 MCG/ML IJ SOSY
75.0000 ug | PREFILLED_SYRINGE | Freq: Once | INTRAMUSCULAR | Status: AC
  Administered 2021-06-19: 75 ug via INTRAVENOUS
  Filled 2021-06-19: qty 2

## 2021-06-19 MED ORDER — FENTANYL CITRATE PF 50 MCG/ML IJ SOSY
12.5000 ug | PREFILLED_SYRINGE | INTRAMUSCULAR | Status: DC | PRN
  Administered 2021-06-20: 50 ug via INTRAVENOUS
  Filled 2021-06-19: qty 1

## 2021-06-19 MED ORDER — SODIUM CHLORIDE 0.9 % IV SOLN: INTRAVENOUS | Status: DC

## 2021-06-19 MED ORDER — OXYCODONE HCL 5 MG PO TABS
5.0000 mg | ORAL_TABLET | ORAL | Status: DC | PRN
  Administered 2021-06-26: 5 mg via ORAL
  Filled 2021-06-19: qty 1

## 2021-06-19 MED ORDER — PIPERACILLIN-TAZOBACTAM 3.375 G IVPB 30 MIN
3.3750 g | Freq: Once | INTRAVENOUS | Status: AC
  Administered 2021-06-19: 3.375 g via INTRAVENOUS
  Filled 2021-06-19: qty 50

## 2021-06-19 MED ORDER — IOHEXOL 300 MG/ML  SOLN
100.0000 mL | Freq: Once | INTRAMUSCULAR | Status: AC | PRN
  Administered 2021-06-19: 100 mL via INTRAVENOUS

## 2021-06-19 MED ORDER — MORPHINE SULFATE (PF) 4 MG/ML IV SOLN: 4.0000 mg | Freq: Once | INTRAVENOUS | Status: DC

## 2021-06-19 MED ORDER — LACTATED RINGERS IV BOLUS
1000.0000 mL | Freq: Once | INTRAVENOUS | Status: AC
  Administered 2021-06-19: 1000 mL via INTRAVENOUS

## 2021-06-19 MED ORDER — PROMETHAZINE HCL 25 MG PO TABS
12.5000 mg | ORAL_TABLET | Freq: Four times a day (QID) | ORAL | Status: DC | PRN
  Administered 2021-06-23: 12.5 mg via ORAL
  Filled 2021-06-19: qty 1

## 2021-06-19 MED ORDER — PIPERACILLIN-TAZOBACTAM 3.375 G IVPB
3.3750 g | Freq: Three times a day (TID) | INTRAVENOUS | Status: DC
  Administered 2021-06-20 – 2021-06-22 (×7): 3.375 g via INTRAVENOUS
  Filled 2021-06-19 (×7): qty 50

## 2021-06-19 NOTE — Progress Notes (Signed)
Pharmacy Antibiotic Note  Vale Mousseau is a 80 y.o. male admitted on 06/19/2021 with  intra-abdominal infection .  Pharmacy has been consulted for zosyn dosing.  Plan: Zosyn 3.375g IV q8h (4 hour infusion).  Height: 5\' 11"  (180.3 cm) Weight: 61.7 kg (136 lb 0.4 oz) IBW/kg (Calculated) : 75.3  Temp (24hrs), Avg:100.2 F (37.9 C), Min:100.2 F (37.9 C), Max:100.2 F (37.9 C)  Recent Labs  Lab 06/13/21 0134 06/14/21 0539 06/19/21 1758  WBC 13.4* 11.3* 12.4*  CREATININE 0.79 0.81 0.66  LATICACIDVEN  --   --  1.6    Estimated Creatinine Clearance: 66.4 mL/min (by C-G formula based on SCr of 0.66 mg/dL).    No Known Allergies  Antimicrobials this admission: zosyn 11/5 >>   Microbiology results: pending  Thank you for allowing pharmacy to be a part of this patient's care.  Heloise Purpura 06/19/2021 9:02 PM

## 2021-06-19 NOTE — ED Notes (Signed)
This RN spoke with Beaulah Corin, pt's granddaughter, on the phone for an update on pt.

## 2021-06-19 NOTE — ED Provider Notes (Signed)
Emergency Medicine Provider Triage Evaluation Note  Albert Love , a 80 y.o. male  was evaluated in triage.  Pt complains of worsening pain, weakness, fatigue, leaking from drainage site, fever.  Patient was evaluated around 2 weeks ago, and admitted for colonic mass, and had a drain placed. Patient also endorses recent hiccups with extreme pain with hiccups. Drain is in place at time of triage evaluation, fluid appears yellow. Patient unsure whether he was placed on any antibiotics -- brief review of recent admission note did not reveal abx regiment -- some suspicion for paget's dz of abdomen. Patient was due to IR re-eval around this time.  Review of Systems  Positive: As above Negative: Chest pain, shortness of breath  Physical Exam  BP (!) 141/81 (BP Location: Left Arm)   Pulse (!) 123   Temp 100.2 F (37.9 C)   Resp 18   Ht 5\' 11"  (1.803 m)   Wt 61.7 kg   SpO2 96%   BMI 18.97 kg/m  Gen:   Awake, no distress, ill appearing Resp:  Normal effort  MSK:   Moves extremities without difficulty  Other:  The drainage site is quite red with extreme tenderness to palpation on the entire right side of the abdomen, with some rigidity, guarding.  No overall rebound tenderness. Tachycardia noted  Medical Decision Making  Medically screening exam initiated at 5:58 PM.  Appropriate orders placed.  Albert Love was informed that the remainder of the evaluation will be completed by another provider, this initial triage assessment does not replace that evaluation, and the importance of remaining in the ED until their evaluation is complete.  Intraabdominal drain infection, concern for sepsis   Anselmo Pickler, PA-C 06/19/21 1802    Malvin Johns, MD 06/19/21 2012

## 2021-06-19 NOTE — ED Notes (Signed)
Patient transported to CT 

## 2021-06-19 NOTE — ED Triage Notes (Signed)
Pt from [place] with ems for c.o possible infection around his abd drain in the RUQ. Redness noted around site, pt has no complaints at this time.

## 2021-06-19 NOTE — ED Provider Notes (Signed)
[facility] EMERGENCY DEPARTMENT Provider Note   CSN: [id number] Arrival date & time: 06/19/21  1748     History Chief Complaint  Patient presents with   Wound Infection    Albert Love is a 80 y.o. male.  HPI Patient is a 80 year old male who presents to the emergency department due to fatigue, weakness, fevers, right-sided abdominal pain, as well as increased drainage into a right-sided JP drain.  Patient recently admitted on October 22 and discharged on November 1.  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  Patient states that over the past few days his symptoms have began to worsen once again.  Reports nausea without vomiting.  No diarrhea.  No urinary complaints.  No URI symptoms.    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   Pneumoperitoneum 09/05/2019   Acute renal insufficiency 09/05/2019   Perforated gastric ulcer s/p omental Graham patch 09/05/2019 09/05/2019   Coagulopathy ([place]) 09/05/2019    Past Surgical History:  Procedure Laterality Date   BOWEL RESECTION N/A 09/05/2019    Procedure: SMALL BOWEL RESECTION;  Surgeon: Clovis Riley, MD;  Location: [facility] ORS;  Service: General;  Laterality: N/A;   IR SINUS/FIST TUBE CHK-NON GI  09/19/2019   IR SINUS/FIST TUBE CHK-NON GI  09/19/2019   LAPAROTOMY N/A 09/05/2019   Procedure: EXPLORATORY LAPAROTOMY WITH [facility] AND GASTRIC BIOPSY;  Surgeon: Clovis Riley, MD;  Location: [facility] ORS;  Service: General;  Laterality: N/A;       Family History  Problem Relation Age of Onset   Colon cancer Neg Hx    Pancreatic cancer Neg Hx    Esophageal cancer Neg Hx     Social History   Tobacco Use   Smoking status: Every Day    Packs/day: 0.50    Types: Cigarettes   Smokeless tobacco: Never  Substance Use Topics   Alcohol use: Yes    Comment: occasionally     Home Medications Prior to Admission medications   Medication Sig Start Date End Date Taking? Authorizing Provider  aspirin 81 MG chewable tablet Chew 1 tablet (81 mg total) by mouth daily. 01/21/21   Gladys Damme, MD  famotidine (PEPCID) 10 MG tablet Take 1 tablet (10 mg total) by mouth daily. 06/16/21   Holley Bouche, MD  feeding supplement (ENSURE ENLIVE / ENSURE PLUS) LIQD Take 237 mLs by mouth 3 (three) times daily between meals. 06/15/21   Holley Bouche, MD  ferrous sulfate 325 (65 FE) MG EC tablet Take 1 tablet (325 mg total) by mouth daily with breakfast. 05/12/21   Gatha Mayer, MD  metoCLOPramide [facility])  10 MG tablet Take 1 tablet (10 mg total) by mouth 3 (three) times daily before meals. 06/15/21   Holley Bouche, MD  Multiple Vitamin (MULTIVITAMIN WITH MINERALS) TABS tablet Take 1 tablet by mouth daily. 06/16/21   Holley Bouche, MD  oxyCODONE 10 MG TABS Take 1 tablet (10 mg total) by mouth every 6 (six) hours. 06/15/21   Holley Bouche, MD    Allergies    Patient has no known allergies.  Review of Systems   Review of Systems  All other systems reviewed and are negative. Ten systems reviewed and are negative for acute change, except as noted  in the HPI.   Physical Exam Updated Vital Signs BP 128/63   Pulse (!) 101   Temp 100.2 F (37.9 C)   Resp 19   Ht 5\' 11"  (1.803 m)   Wt 61.7 kg   SpO2 96%   BMI 18.97 kg/m   Physical Exam Vitals and nursing note reviewed.  Constitutional:      General: He is not in acute distress.    Appearance: Normal appearance. He is not ill-appearing, toxic-appearing or diaphoretic.  HENT:     Head: Normocephalic and atraumatic.     Right Ear: External ear normal.     Left Ear: External ear normal.     Nose: Nose normal.     Mouth/Throat:     Mouth: Mucous membranes are moist.     Pharynx: Oropharynx is clear. No oropharyngeal exudate or posterior oropharyngeal erythema.  Eyes:     General: No scleral icterus.       Right eye: No discharge.        Left eye: No discharge.     Extraocular Movements: Extraocular movements intact.     Conjunctiva/sclera: Conjunctivae normal.  Cardiovascular:     Rate and Rhythm: Regular rhythm. Tachycardia present.     Pulses: Normal pulses.     Heart sounds: Normal heart sounds. No murmur heard.   No friction rub. No gallop.  Pulmonary:     Effort: Pulmonary effort is normal. No respiratory distress.     Breath sounds: Normal breath sounds. No stridor. No wheezing, rhonchi or rales.  Abdominal:     General: Abdomen is flat.     Tenderness: There is abdominal tenderness.     Comments: Abdomen is flat.  JP drain noted to the right abdomen with a moderate amount of green/brown discharge noted.  Surrounding erythema noted at the JP drain site.  Increased rigidity and tenderness noted along the right abdominal region.  Musculoskeletal:        General: Normal range of motion.     Cervical back: Normal range of motion and neck supple. No tenderness.  Skin:    General: Skin is warm and dry.  Neurological:     General: No focal deficit present.     Mental Status: He is alert and oriented to person, place, and time.  Psychiatric:        Mood and Affect:  Mood normal.        Behavior: Behavior normal.   ED Results / Procedures / Treatments   Labs (all labs ordered are listed, but only abnormal results are displayed) Labs Reviewed  COMPREHENSIVE METABOLIC PANEL - Abnormal; Notable for the following components:      Result Value   Sodium 132 (*)    Chloride 97 (*)    BUN 6 (*)    Calcium 8.3 (*)    Total Protein 6.0 (*)  Albumin 2.1 (*)    All other components within normal limits  CBC WITH DIFFERENTIAL/PLATELET - Abnormal; Notable for the following components:   WBC 12.4 (*)    Hemoglobin 9.9 (*)    HCT 31.9 (*)    MCV 67.2 (*)    MCH 20.8 (*)    RDW 19.4 (*)    Platelets 517 (*)    Neutro Abs 10.0 (*)    Monocytes Absolute 1.1 (*)    Abs Immature Granulocytes 0.08 (*)    All other components within normal limits  PROTIME-INR - Abnormal; Notable for the following components:   Prothrombin Time 17.2 (*)    INR 1.4 (*)    All other components within normal limits  CULTURE, BLOOD (ROUTINE X 2)  CULTURE, BLOOD (ROUTINE X 2)  RESP PANEL BY RT-PCR (FLU A&B, COVID) ARPGX2  LACTIC ACID, PLASMA  URINALYSIS, ROUTINE W REFLEX MICROSCOPIC    EKG None  Radiology CT ABDOMEN PELVIS W CONTRAST  Result Date: 06/19/2021 CLINICAL DATA:  Abdominal pain and fevers, known right iliacus abscess for, status post percutaneous drainage. EXAM: CT ABDOMEN AND PELVIS WITH CONTRAST TECHNIQUE: Multidetector CT imaging of the abdomen and pelvis was performed using the standard protocol following bolus administration of intravenous contrast. CONTRAST:  156mL OMNIPAQUE IOHEXOL 300 MG/ML  SOLN COMPARISON:  06/06/2021 FINDINGS: Lower chest: No acute abnormality. Hepatobiliary: Liver demonstrates fatty infiltration. A mildly lobulated cyst is noted in the left lobe of the liver stable from prior exam. The gallbladder is well distended. Pancreas: Unremarkable. No pancreatic ductal dilatation or surrounding inflammatory changes. Spleen: Normal in size without  focal abnormality. Adrenals/Urinary Tract: Adrenal glands are within normal limits. Kidneys demonstrate a normal enhancement pattern bilaterally. No renal calculi or obstructive changes are seen. Normal excretion of contrast is noted. The bladder is partially distended. Stomach/Bowel: Fecal material is noted throughout the colon. No obstructive changes are seen. There is again noted a diffuse area of wall thickening involving the cecum and proximal ascending colon again suspicious for malignancy. This appears to be contiguous with the known right lower quadrant abscess involving the iliacus and psoas muscles extending along the anterior aspect of the right hip joint. More air is noted within the collection in the right lower quadrant and a drainage catheter is noted within. The area of inter communication between this and the adjacent thickened cecum is best noted on the coronal image 33 of series 6. Small bowel and stomach appear within normal limits. Vascular/Lymphatic: Aortic atherosclerosis. No enlarged abdominal or pelvic lymph nodes. Reproductive: Prostate is again enlarged indenting upon the inferior aspect of the bladder similar to that seen on the prior exam. Other: There is a large air-fluid collection identified along the course of the right psoas and iliacus muscles. This measures approximately 8.6 x 5.8 cm in greatest dimension and is slightly smaller than that seen on the prior exam when it measured up to 9.4 cm. It extends for approximately 8.6 cm in craniocaudad projection. Pigtail catheter is noted within the collection although the collection continues to extend along the anterior aspect of the right hip joint and proximal right femur as well as apparently communicating with the thickened cecum consistent with focal perforation. No free fluid is noted within the pelvis. Musculoskeletal: Degenerative changes of the lumbar spine are noted. No erosive changes are identified at this time. IMPRESSION:  Persistent air-fluid collection identified in the right iliac fossa with percutaneous drainage catheter within. It is slightly smaller than that seen on the  prior exam from 06/06/2021. It appears to communicate with the thickened and inflamed cecum as described best noted on the coronal images. Again the possibility of cecal malignancy deserves consideration given the appearance of the cecum and proximal ascending colon. Remainder of the exam is stable from the recent exam from 06/06/2021. Electronically Signed   By: Inez Catalina M.D.   On: 06/19/2021 20:31   DG Chest Portable 1 View  Result Date: 06/19/2021 CLINICAL DATA:  Possible sepsis. EXAM: PORTABLE CHEST 1 VIEW COMPARISON:  June 07, 2021 FINDINGS: Tortuosity of the aorta. Cardiomediastinal silhouette is normal. Mediastinal contours appear intact. There is no evidence of focal airspace consolidation, pleural effusion or pneumothorax. Osseous structures are without acute abnormality. Soft tissues are grossly normal. IMPRESSION: No active disease. Electronically Signed   By: Fidela Salisbury M.D.   On: 06/19/2021 19:12    Procedures .Critical Care Performed by: Rayna Sexton, PA-C Authorized by: Rayna Sexton, PA-C   Critical care provider statement:    Critical care time (minutes):  30   Critical care was necessary to treat or prevent imminent or life-threatening deterioration of the following conditions:  Sepsis   Critical care was time spent personally by me on the following activities:  Development of treatment plan with patient or surrogate, evaluation of patient's response to treatment, examination of patient, ordering and performing treatments and interventions, ordering and review of laboratory studies, ordering and review of radiographic studies and re-evaluation of patient's condition   Medications Ordered in ED Medications  acetaminophen (TYLENOL) tablet 650 mg (has no administration in time range)  lactated ringers  bolus 1,000 mL (has no administration in time range)  lactated ringers bolus 1,000 mL (1,000 mLs Intravenous New Bag/Given 06/19/21 1857)  fentaNYL (SUBLIMAZE) injection 75 mcg (75 mcg Intravenous Given 06/19/21 1913)  iohexol (OMNIPAQUE) 300 MG/ML solution 100 mL (100 mLs Intravenous Contrast Given 06/19/21 1957)    ED Course  I have reviewed the triage vital signs and the nursing notes.  Pertinent labs & imaging results that were available during my care of the patient were reviewed by me and considered in my medical decision making (see chart for details).    MDM Rules/Calculators/A&P                          Pt is a 80 y.o. male who presents to the emergency department with fevers, weakness, fatigue, increased purulent output in his JP drain.  Labs: CBC with a white count of 12.4, hemoglobin of 9.9, platelets of 517, neutrophils of 10, monocytes of 1.1, absolute immature granulocytes of 0.08. CMP with a sodium of 132, chloride 97, BUN of 6, calcium of 8.3, total protein of 6, albumin of 2.1. Lactic acid 1.6. Prothrombin time of 17.2 with an INR of 1.4 Blood cultures obtained. Respiratory panel is pending. UA is pending.  Imaging: Chest x-ray as well as CT scan of the abdomen/pelvis with contrast with findings as noted above.  I, Rayna Sexton, PA-C, personally reviewed and evaluated these images and lab results as part of my medical decision-making.  Patient with a recent admission and diagnosed with a colonic mass, intra-abdominal abscess, and has a JP drain in place which was placed by IR on October 24.  Physical exam significant for moderate tenderness along the right side of the abdomen.  Mild erythema around the JP drain site.  Green and brown purulent discharge noted in the JP drain.  Patient with a low-grade fever  of 100.2 F, tachycardia, as well as leukocytosis.  Meets sepsis criteria.  Treated with IV fluids, Tylenol, as well as Zosyn.  Patient will require admission for  further management.  We will discuss with the medicine team.  Note: Portions of this report may have been transcribed using voice recognition software. Every effort was made to ensure accuracy; however, inadvertent computerized transcription errors may be present.   Final Clinical Impression(s) / ED Diagnoses Final diagnoses:  Colonic mass  Failure to thrive in adult  Sepsis, due to unspecified organism, unspecified whether acute organ dysfunction present [facility])   Rx / DC Orders ED Discharge Orders     None        Rayna Sexton, PA-C 06/19/21 2051    Gareth Morgan, MD 06/21/21 2154

## 2021-06-19 NOTE — ED Notes (Signed)
Granddaughter Marney Setting [phone] would like an update

## 2021-06-20 DIAGNOSIS — A419 Sepsis, unspecified organism: Principal | ICD-10-CM

## 2021-06-20 DIAGNOSIS — K6389 Other specified diseases of intestine: Secondary | ICD-10-CM | POA: Diagnosis not present

## 2021-06-20 DIAGNOSIS — D649 Anemia, unspecified: Secondary | ICD-10-CM | POA: Diagnosis not present

## 2021-06-20 DIAGNOSIS — K651 Peritoneal abscess: Secondary | ICD-10-CM | POA: Diagnosis not present

## 2021-06-20 LAB — CBC
HCT: 27.5 % — ABNORMAL LOW (ref 39.0–52.0)
Hemoglobin: 8.4 g/dL — ABNORMAL LOW (ref 13.0–17.0)
MCH: 20.4 pg — ABNORMAL LOW (ref 26.0–34.0)
MCHC: 30.5 g/dL (ref 30.0–36.0)
MCV: 66.9 fL — ABNORMAL LOW (ref 80.0–100.0)
Platelets: 496 10*3/uL — ABNORMAL HIGH (ref 150–400)
RBC: 4.11 MIL/uL — ABNORMAL LOW (ref 4.22–5.81)
RDW: 18.6 % — ABNORMAL HIGH (ref 11.5–15.5)
WBC: 10.8 10*3/uL — ABNORMAL HIGH (ref 4.0–10.5)
nRBC: 0 % (ref 0.0–0.2)

## 2021-06-20 LAB — BASIC METABOLIC PANEL
Anion gap: 10 (ref 5–15)
BUN: 8 mg/dL (ref 8–23)
CO2: 23 mmol/L (ref 22–32)
Calcium: 7.7 mg/dL — ABNORMAL LOW (ref 8.9–10.3)
Chloride: 98 mmol/L (ref 98–111)
Creatinine, Ser: 0.55 mg/dL — ABNORMAL LOW (ref 0.61–1.24)
GFR, Estimated: >60 mL/min (ref >60)
Glucose, Bld: 85 mg/dL (ref 70–99)
Potassium: 3.9 mmol/L (ref 3.5–5.1)
Sodium: 131 mmol/L — ABNORMAL LOW (ref 135–145)

## 2021-06-20 LAB — URINALYSIS, ROUTINE W REFLEX MICROSCOPIC
Bilirubin Urine: NEGATIVE
Glucose, UA: NEGATIVE mg/dL
Hgb urine dipstick: NEGATIVE
Ketones, ur: 20 mg/dL — AB
Leukocytes,Ua: NEGATIVE
Nitrite: NEGATIVE
Protein, ur: NEGATIVE mg/dL
Specific Gravity, Urine: 1.019 (ref 1.005–1.030)
pH: 6 (ref 5.0–8.0)

## 2021-06-20 MED ORDER — SODIUM CHLORIDE 0.9 % IV SOLN: 12.5000 mg | Freq: Four times a day (QID) | INTRAVENOUS | Status: DC | PRN

## 2021-06-20 MED ORDER — METOCLOPRAMIDE HCL 10 MG PO TABS
10.0000 mg | ORAL_TABLET | Freq: Three times a day (TID) | ORAL | Status: DC | PRN
  Administered 2021-06-20 (×2): 10 mg via ORAL
  Filled 2021-06-20 (×2): qty 1

## 2021-06-20 MED ORDER — FLEET ENEMA 7-19 GM/118ML RE ENEM
1.0000 | ENEMA | Freq: Once | RECTAL | Status: DC
  Filled 2021-06-20: qty 1

## 2021-06-20 MED ORDER — METOCLOPRAMIDE HCL 5 MG PO TABS
10.0000 mg | ORAL_TABLET | Freq: Three times a day (TID) | ORAL | Status: DC
  Administered 2021-06-20 – 2021-06-21 (×3): 10 mg via ORAL
  Filled 2021-06-20: qty 1
  Filled 2021-06-20 (×2): qty 2

## 2021-06-20 MED ORDER — SENNOSIDES-DOCUSATE SODIUM 8.6-50 MG PO TABS
1.0000 | ORAL_TABLET | Freq: Two times a day (BID) | ORAL | Status: DC
  Filled 2021-06-20 (×2): qty 1

## 2021-06-20 MED ORDER — DOCUSATE SODIUM 100 MG PO CAPS
100.0000 mg | ORAL_CAPSULE | Freq: Two times a day (BID) | ORAL | Status: DC
  Filled 2021-06-20 (×2): qty 1

## 2021-06-20 MED ORDER — POLYETHYLENE GLYCOL 3350 17 G PO PACK: 17.0000 g | PACK | Freq: Every day | ORAL | Status: DC

## 2021-06-20 MED ORDER — BISACODYL 10 MG RE SUPP: 10.0000 mg | Freq: Every day | RECTAL | Status: DC | PRN

## 2021-06-20 MED ORDER — POLYETHYLENE GLYCOL 3350 17 G PO PACK
17.0000 g | PACK | Freq: Two times a day (BID) | ORAL | Status: DC
  Filled 2021-06-20 (×2): qty 1

## 2021-06-20 NOTE — ED Notes (Signed)
RN cleaned pt up. New diaper applied. Pt had a large BM.
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
## 2021-06-20 NOTE — Progress Notes (Signed)
Patient ID: Albert Love, male   DOB: 30-Sep-1940, 80 y.o.   MRN: [id number] [facility] Surgery Progress Note     Subjective: CC-  Patient was admitted last month and found to have an inoperable colonic mass that perforated into retroperitoneum. He underwent IR drain placement 06/07/21 into retroperitoneal fluid collection. He was discharged with palliative on 06/15/21. He returns today with worsening abdominal pain and fevers. CT scan shows slightly smaller air-fluid collection in the right iliac fossa with percutaneous drainage catheter within and now communication to the cecum. Patient requests second opinion.  Objective: Vital signs in last 24 hours: Temp:  [99.3 F (37.4 C)-100.2 F (37.9 C)] 99.3 F (37.4 C) (11/06 0730) Pulse Rate:  [85-123] 95 (11/06 0830) Resp:  [16-24] 20 (11/06 0830) BP: (120-144)/(63-81) 120/64 (11/06 0830) SpO2:  [95 %-98 %] 96 % (11/06 0830) Weight:  [61.7 kg] 61.7 kg (11/05 1756)    Intake/Output from previous day: 11/05 0701 - 11/06 0700 In: 1000 [IV Piggyback:1000] Out: -  Intake/Output this shift: No intake/output data recorded.  PE: Gen:  Alert, NAD, cachectic  HEENT: EOM's intact, pupils equal and round Card:  RRR Pulm: rate and effort normal on room air Abd: Soft, mild tenderness around drain, drain with feculent fluid in bulb Psych: A&Ox3 Skin: no rashes noted, warm and dry  Lab Results:  Recent Labs    06/19/21 1758 06/20/21 0638  WBC 12.4* 10.8*  HGB 9.9* 8.4*  HCT 31.9* 27.5*  PLT 517* 496*   BMET Recent Labs    06/19/21 1758 06/20/21 0638  NA 132* 131*  K 4.2 3.9  CL 97* 98  CO2 25 23  GLUCOSE 98 85  BUN 6* 8  CREATININE 0.66 0.55*  CALCIUM 8.3* 7.7*   PT/INR Recent Labs    06/19/21 1758  LABPROT 17.2*  INR 1.4*   CMP     Component Value Date/Time   NA 131 (L) 06/20/2021 0638   K 3.9 06/20/2021 0638   CL 98 06/20/2021 0638   CO2 23 06/20/2021 0638   GLUCOSE 85 06/20/2021 0638   BUN 8  06/20/2021 0638   CREATININE 0.55 (L) 06/20/2021 0638   CALCIUM 7.7 (L) 06/20/2021 0638   PROT 6.0 (L) 06/19/2021 1758   ALBUMIN 2.1 (L) 06/19/2021 1758   AST 21 06/19/2021 1758   ALT 21 06/19/2021 1758   ALKPHOS 69 06/19/2021 1758   BILITOT 1.1 06/19/2021 1758   GFRNONAA >60 06/20/2021 0638   GFRAA >60 11/02/2019 0026   Lipase     Component Value Date/Time   LIPASE 23 06/05/2021 2328       Studies/Results: CT ABDOMEN PELVIS W CONTRAST  Result Date: 06/19/2021 CLINICAL DATA:  Abdominal pain and fevers, known right iliacus abscess for, status post percutaneous drainage. EXAM: CT ABDOMEN AND PELVIS WITH CONTRAST TECHNIQUE: Multidetector CT imaging of the abdomen and pelvis was performed using the standard protocol following bolus administration of intravenous contrast. CONTRAST:  14mL OMNIPAQUE IOHEXOL 300 MG/ML  SOLN COMPARISON:  06/06/2021 FINDINGS: Lower chest: No acute abnormality. Hepatobiliary: Liver demonstrates fatty infiltration. A mildly lobulated cyst is noted in the left lobe of the liver stable from prior exam. The gallbladder is well distended. Pancreas: Unremarkable. No pancreatic ductal dilatation or surrounding inflammatory changes. Spleen: Normal in size without focal abnormality. Adrenals/Urinary Tract: Adrenal glands are within normal limits. Kidneys demonstrate a normal enhancement pattern bilaterally. No renal calculi or obstructive changes are seen. Normal excretion of contrast is noted. The bladder is partially  distended. Stomach/Bowel: Fecal material is noted throughout the colon. No obstructive changes are seen. There is again noted a diffuse area of wall thickening involving the cecum and proximal ascending colon again suspicious for malignancy. This appears to be contiguous with the known right lower quadrant abscess involving the iliacus and psoas muscles extending along the anterior aspect of the right hip joint. More air is noted within the collection in the  right lower quadrant and a drainage catheter is noted within. The area of inter communication between this and the adjacent thickened cecum is best noted on the coronal image 33 of series 6. Small bowel and stomach appear within normal limits. Vascular/Lymphatic: Aortic atherosclerosis. No enlarged abdominal or pelvic lymph nodes. Reproductive: Prostate is again enlarged indenting upon the inferior aspect of the bladder similar to that seen on the prior exam. Other: There is a large air-fluid collection identified along the course of the right psoas and iliacus muscles. This measures approximately 8.6 x 5.8 cm in greatest dimension and is slightly smaller than that seen on the prior exam when it measured up to 9.4 cm. It extends for approximately 8.6 cm in craniocaudad projection. Pigtail catheter is noted within the collection although the collection continues to extend along the anterior aspect of the right hip joint and proximal right femur as well as apparently communicating with the thickened cecum consistent with focal perforation. No free fluid is noted within the pelvis. Musculoskeletal: Degenerative changes of the lumbar spine are noted. No erosive changes are identified at this time. IMPRESSION: Persistent air-fluid collection identified in the right iliac fossa with percutaneous drainage catheter within. It is slightly smaller than that seen on the prior exam from 06/06/2021. It appears to communicate with the thickened and inflamed cecum as described best noted on the coronal images. Again the possibility of cecal malignancy deserves consideration given the appearance of the cecum and proximal ascending colon. Remainder of the exam is stable from the recent exam from 06/06/2021. Electronically Signed   By: Inez Catalina M.D.   On: 06/19/2021 20:31   DG Chest Portable 1 View  Result Date: 06/19/2021 CLINICAL DATA:  Possible sepsis. EXAM: PORTABLE CHEST 1 VIEW COMPARISON:  June 07, 2021 FINDINGS:  Tortuosity of the aorta. Cardiomediastinal silhouette is normal. Mediastinal contours appear intact. There is no evidence of focal airspace consolidation, pleural effusion or pneumothorax. Osseous structures are without acute abnormality. Soft tissues are grossly normal. IMPRESSION: No active disease. Electronically Signed   By: Fidela Salisbury M.D.   On: 06/19/2021 19:12    Anti-infectives: Anti-infectives (From admission, onward)    Start     Dose/Rate Route Frequency Ordered Stop   06/20/21 0515  piperacillin-tazobactam (ZOSYN) IVPB 3.375 g       See Hyperspace for full Linked Orders Report.   3.375 g 12.5 mL/hr over 240 Minutes Intravenous Every 8 hours 06/19/21 2102     06/19/21 2115  piperacillin-tazobactam (ZOSYN) IVPB 3.375 g       See Hyperspace for full Linked Orders Report.   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 Continue drain - he appears constipated on CT. Will give enema and order suppositories PRN -  thorazine for hiccups - recommend having palliative see again while he is here   FEN: reg diet, bowel regimen ID: Zosyn  VTE: Okay for chemical prophylaxis from our standpoint   IDA HTN Protein calorie malnutrition H/o CRAO   LOS: 1 day    [place]ellington [place], [facility] 06/20/2021, 10:10 AM Please see Amion for pager number during day hours 7:00am-4:30pm

## 2021-06-20 NOTE — ED Notes (Signed)
Pt had 2 large bowel movements. RN and NT cleaned up pt. Clean chucks and diaper applied.

## 2021-06-20 NOTE — Progress Notes (Signed)
Received secure chat from RN that patient's temp spiked to 102.3*F. Tylenol given. Patient is not yet 24 hours on zosyn coverage, will be at 2133 tonight. No indication to broaden quite yet. If re-fevers, will consider.   Also, please use service pager [phone] for significant patient vitals or status changes such as this.   Ezequiel Essex, MD

## 2021-06-20 NOTE — ED Notes (Signed)
This RN checked the ABD pad covering pt's drain site; pad noted to be soiled with wound drainage. New ABD placed over site.

## 2021-06-20 NOTE — Hospital Course (Addendum)
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 His medical course is below.  Sepsis due to intra-abdominal infection  retroperitoneal abscess  abdominal pain  cecal fistula / mass Patient admitted October 22 - November 1 and found to have inoperable cecal mass that perforated into retroperitoneum. He underwent IR drain placement 06/07/21 into right sided retroperitoneal fluid collection. Patient discharged home on 06/15/21.  Patient presented 06/19/21 with fever, increasing right-sided abdominal pain, and increasing purulent drainage into right-sided JP drain.  He met sepsis criteria with fever, tachycardia to 120s, leukocytosis to 12.4 with left shift given ANC of 10.  CT scan showed fistula communicating with cecum. Source of infection was intra-abdominal given CT findings and purulent JP drainage.  CXR with no disease process. In the ED, patient received 2L NS and was started on zosyn, after blood cultures were obtained. Surgery was consulted and determined that the mass was still inoperable. IR was consulted and decided to exchange the drain on 06/22/21. 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 Also showed marked prostatomegaly with mass effect on urinary bladder. GI was consulted for diagnostic colonoscopy of the unresectable mass.The risk of performing colonoscopy is quite high and would be purely a  diagnostic procedure and not therapeutic, the risks outweigh the benefits per GI. Depending on clinical course and GOC, could revisit colonoscopy in a few weeks after resolution of abscess and drain removed for oncology follow up.  Hiccups Patient presented with hiccups for last 2-3 weeks that were aggravating abdominal pain. During admission, patient was treated with Zofran, Reglan, and Thorazine, but continued to have hiccups. Patient later started on Baclofen 5 mg TID and appeared to have resolution of symptoms.   Electrolyte abnormalities Patient presented with Hyponatremia to 132, hypochloremia to 97 in the setting of dehydration. Electrolyte disturbance was resolved with gentle maintenance IVF. While admitted, patient K was noted to be 3.3, and was repleted with K supplement.   Macrocytic anemia and thrombocytosis Patient presented with a Hgb of 9.9 with a Plt of 517. Hgb and platelets were stable during admission, patient didn't require a transfusion. His transfusion threshold was 7.   Enlarged prostate Urine output 1 ml/kg/hr only charted on second shift. CTA showed marked prostatomegaly with mass effect on the undersurface of the urinary bladder. Started on tamsulosin 0.4 mg at discharge. PSA was 9.9, free pSA 0.87 and free % 8.8.  [place] Patient following with Palliative Care outpatient. He signed a MOST form agreeing to DNR/DNI, limited use of anx/IVF and no feeding tube. Last hospitalization patient decided  he would go home with hospice and his nephew Albert Love had agreed to care for him, who later had a change of heart. Albert Love therefore went to [facility] for rehabilitation with the idea of transition to hospice thereafter though he returned septic w/in a week. He did not qualify for any hospice facilities due to antibiotics use. He returned back to SNF with palliative following outpatient.  COVID Positive COVID PCR positive on 11/10.  Mostly asymptomatic, only had diarrhea although  unknown if this was related to COVID.  Patient is unvaccinated against COVID   Issues for Follow Up:  Obtain second opinion from surgery outpatient as patietn was evaluated by surgery in same group  Recommend maintaining the drainage catheter to a JP bulb, continued b.i.d. flushing with 10 cc normal saline and diligent records regarding daily drainage catheter output Follow-up at the interventional radiology drain clinic next week (week of November 14) for IV only contrast CT of the abdomen and pelvis, evaluation and management and drainage catheter injection. F/u GI outpatient if colonoscopy wanted for tissue biopsy/to be able to follow with oncology Consider urology outpatient follow up for prostate enlargement. PSA was elevated

## 2021-06-20 NOTE — ED Notes (Signed)
Pt incontinent of urine. Pt cleaned , sheets changed condom cath. Applied .

## 2021-06-20 NOTE — Progress Notes (Signed)
Family Medicine Teaching Service Daily Progress Note Intern Pager: [phone]  Patient name: Albert Love record number: [id number] Date of birth: 11/08/1940 Age: 80 y.o. 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  Sepsis due to intra-abdominal infection  Abdominal pain  Cecal mass Tachycardia and fever improved after zosyn and IVF. WBC decreased from 12.4 to 10.8. Awaiting blood cultures. Foul smell and purulent/cloudy drainage from JP drain still present. In setting of likely malignancy and now with foul odor and infection, suspect perforation. Patient is interested in second opinion on operability of cecal mass, will consult general surgery for assessment. JP drain is draining well, CT imaging shows that area of R iliac fossa does not have increase in size and pigtail is located within the fluid site- no need for IR today, but depending on length of stay may need follow up or reassessment. Pain is well controlled with acetaminophen/oxycodone/fentanyl options, currently patient has used fentanyl twice for pain. - Consult surgery, appreciate recommendations - Continue zosyn  - mIVF NS - Reglan for nausea/hiccups - AM CBC, BMP - Continue current pain regimen - Monitor for BM, if constipated add  miralax - Regular diet - PT eval and treat - SCDs for DVT ppx  GOC Patient following with palliative care outpatient. He signed a MOST form at last visit where he agreed to DNR/DNI, limited use of abx/IVF, and no feeding tube. Today he feels much better after abx and IVF, he wishes to continue those for today. Recommend palliative care consult to help with [place] on defining scope of treatment. Patient also wants a second opinion regarding surgical options, see above. Patient opted for SCDs for DVT ppx, given option of no tx vs SCDs cs lovenox. If patient does have surgery, should be transitioned to full care including lovenox for DVT ppx. - Palliative care consult, appreciate recommendations  Microcytic anemia and thrombocytopenia  Hemoglobin decreased today from 9.9 last night to 8.4 today.  However all cell lines have decreased as well including white blood cells and platelets (517 down to 96).  Given the setting of also lines decreased and patient was dehydrated and very dry on exam last night, s/p 2 L NS bolus and maintenance IV fluids, suspect hemoconcentration.  He denies blood in stools. - Monitor for bleeding - A.m. 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  Not concern for refeeding at this time as patient has been eating appropriately outside of the hospital. - Consult nutrition for dietary recommendations  Central retinal artery occlusion of right eye  hyperlipidemia Patient is blind in right eye due to [place] for March 2022.  He has hyperlipidemia, no home medications currently due to discontinuation after goals of care discussion.  History of hypertension Patient has been normotensive during this admission, most recent blood pressure 120/64.  Home medications discontinued given  goals of care less admission. - Monitor BP  Osteopenia Noted on CT 06/06/2021,  possible positive disease of left side of pelvis from CT of 09/17/2019.  Monitor for pain or fracture - PT to follow on Monday when patient feeling better  Enlarged prostate Prostate large on CT admission on 06/06/2021, patient reports no problems urinating, nursing note of urinary incontinence this morning. -Monitor for urinary obstruction  FEN/GI: regular diet PPx: SCDs Dispo:Pending PT recommendations  pending clinical improvement . Barriers include sepsis treatment, pain management, GOC.   Subjective:  Patient reports he feels much better this morning his pain has been well controlled overnight.  His hiccups have gone away after the Reglan and some water.  He reports that his bowel's have slowed down, not as much cramping.  Objective: Temp:  [100.2 F (37.9 C)] 100.2 F (37.9 C) (11/05 1752) Pulse Rate:  [99-123] 100 (11/06 0430) Resp:  [16-24] 20 (11/06 0430) BP: (128-144)/(63-81) 135/69 (11/06 0430) SpO2:  [95 %-97 %] 96 % (11/06 0430) Weight:  [61.7 kg] 61.7 kg (11/05 1756) Physical Exam: General: Ill-appearing, cachectic AAM, NAD, resting in bed Cardiovascular: RRR, no M/R/G, 2+ radial pulses Respiratory: CTAB Abdomen: Abdomen is soft except for mass in right lower quadrant, tender in right lower quadrant, normoactive bowel sounds Extremities: Thin, warm, dry, no edema  Laboratory: Recent Labs  Lab 06/14/21 0539 06/19/21 1758  WBC 11.3* 12.4*  HGB 9.9* 9.9*  HCT 32.7* 31.9*  PLT 536* 517*   Recent Labs  Lab 06/14/21 0539 06/19/21 1758  NA 135 132*  K 4.4 4.2  CL 98 97*  CO2 31 25  BUN 11 6*  CREATININE 0.81 0.66  CALCIUM 9.3 8.3*  PROT 6.1* 6.0*  BILITOT 0.6 1.1  ALKPHOS 73 69  ALT 40 21  AST 39 21  GLUCOSE 95 98   Imaging/Diagnostic Tests: CT ABDOMEN PELVIS W CONTRAST  Result Date: 06/19/2021 CLINICAL DATA:  Abdominal pain and fevers, known right iliacus abscess for, status post percutaneous drainage. EXAM: CT ABDOMEN AND PELVIS WITH CONTRAST  TECHNIQUE: Multidetector CT imaging of the abdomen and pelvis was performed using the standard protocol following bolus administration of intravenous contrast. CONTRAST:  186mL OMNIPAQUE IOHEXOL 300 MG/ML  SOLN COMPARISON:  06/06/2021 FINDINGS: Lower chest: No acute abnormality. Hepatobiliary: Liver demonstrates fatty infiltration. A mildly lobulated cyst is noted in the left lobe of the liver stable from prior exam. The gallbladder is well distended. Pancreas: Unremarkable. No pancreatic ductal dilatation or surrounding inflammatory changes. Spleen: Normal in size without focal abnormality. Adrenals/Urinary Tract: Adrenal glands are within normal limits. Kidneys demonstrate a normal enhancement pattern bilaterally. No renal calculi or obstructive changes are seen. Normal excretion of contrast is noted. The bladder is partially distended. Stomach/Bowel: Fecal material is noted throughout the colon. No obstructive changes are seen. There is again noted a diffuse area of wall thickening involving the cecum and proximal ascending colon again suspicious for malignancy. This appears to be contiguous with the known right lower quadrant abscess involving the iliacus and psoas muscles extending along the anterior aspect of the right hip joint. More air is noted within the collection in the right lower quadrant and a drainage catheter is noted within. The area of inter communication between this and the adjacent thickened cecum is best noted on the coronal image 33 of series 6. Small bowel and stomach appear within normal limits. Vascular/Lymphatic: Aortic atherosclerosis. No enlarged abdominal or pelvic lymph nodes. Reproductive: Prostate is again enlarged indenting upon the inferior  aspect of the bladder similar to that seen on the prior exam. Other: There is a large air-fluid collection identified along the course of the right psoas and iliacus muscles. This measures approximately 8.6 x 5.8 cm in greatest dimension and is  slightly smaller than that seen on the prior exam when it measured up to 9.4 cm. It extends for approximately 8.6 cm in craniocaudad projection. Pigtail catheter is noted within the collection although the collection continues to extend along the anterior aspect of the right hip joint and proximal right femur as well as apparently communicating with the thickened cecum consistent with focal perforation. No free fluid is noted within the pelvis. Musculoskeletal: Degenerative changes of the lumbar spine are noted. No erosive changes are identified at this time. IMPRESSION: Persistent air-fluid collection identified in the right iliac fossa with percutaneous drainage catheter within. It is slightly smaller than that seen on the prior exam from 06/06/2021. It appears to communicate with the thickened and inflamed cecum as described best noted on the coronal images. Again the possibility of cecal malignancy deserves consideration given the appearance of the cecum and proximal ascending colon. Remainder of the exam is stable from the recent exam from 06/06/2021. Electronically Signed   By: Inez Catalina M.D.   On: 06/19/2021 20:31   DG Chest Portable 1 View  Result Date: 06/19/2021 CLINICAL DATA:  Possible sepsis. EXAM: PORTABLE CHEST 1 VIEW COMPARISON:  June 07, 2021 FINDINGS: Tortuosity of the aorta. Cardiomediastinal silhouette is normal. Mediastinal contours appear intact. There is no evidence of focal airspace consolidation, pleural effusion or pneumothorax. Osseous structures are without acute abnormality. Soft tissues are grossly normal. IMPRESSION: No active disease. Electronically Signed   By: Fidela Salisbury M.D.   On: 06/19/2021 19:12    Gladys Damme, MD 06/20/2021, 5:10 AM PGY-3, [place] Intern pager: [phone], text pages welcome

## 2021-06-20 NOTE — Progress Notes (Signed)
Arrived into room 6N20 from the ED via stretcher. Patient alert and oriented. Patient had a large bowel movement. Patients rt flank jp drain intact but draining pus. Primary RN made aware. All linens changed. Vital signs taken, patient having fever of 102. 3 orally. Robyn primary Nurse made aware.

## 2021-06-21 DIAGNOSIS — R627 Adult failure to thrive: Secondary | ICD-10-CM | POA: Diagnosis not present

## 2021-06-21 DIAGNOSIS — K6389 Other specified diseases of intestine: Secondary | ICD-10-CM | POA: Diagnosis not present

## 2021-06-21 DIAGNOSIS — K651 Peritoneal abscess: Secondary | ICD-10-CM | POA: Diagnosis not present

## 2021-06-21 LAB — CBC
HCT: 24.6 % — ABNORMAL LOW (ref 39.0–52.0)
Hemoglobin: 8 g/dL — ABNORMAL LOW (ref 13.0–17.0)
MCH: 21.2 pg — ABNORMAL LOW (ref 26.0–34.0)
MCHC: 32.5 g/dL (ref 30.0–36.0)
MCV: 65.3 fL — ABNORMAL LOW (ref 80.0–100.0)
Platelets: 502 10*3/uL — ABNORMAL HIGH (ref 150–400)
RBC: 3.77 MIL/uL — ABNORMAL LOW (ref 4.22–5.81)
RDW: 18.6 % — ABNORMAL HIGH (ref 11.5–15.5)
WBC: 14.3 10*3/uL — ABNORMAL HIGH (ref 4.0–10.5)
nRBC: 0 % (ref 0.0–0.2)

## 2021-06-21 LAB — BASIC METABOLIC PANEL
Anion gap: 8 (ref 5–15)
BUN: 8 mg/dL (ref 8–23)
CO2: 25 mmol/L (ref 22–32)
Calcium: 7.7 mg/dL — ABNORMAL LOW (ref 8.9–10.3)
Chloride: 98 mmol/L (ref 98–111)
Creatinine, Ser: 0.63 mg/dL (ref 0.61–1.24)
GFR, Estimated: >60 mL/min (ref >60)
Glucose, Bld: 105 mg/dL — ABNORMAL HIGH (ref 70–99)
Potassium: 3.3 mmol/L — ABNORMAL LOW (ref 3.5–5.1)
Sodium: 131 mmol/L — ABNORMAL LOW (ref 135–145)

## 2021-06-21 LAB — PROCALCITONIN: Procalcitonin: 0.45 ng/mL

## 2021-06-21 MED ORDER — POTASSIUM CHLORIDE CRYS ER 20 MEQ PO TBCR
40.0000 meq | EXTENDED_RELEASE_TABLET | Freq: Once | ORAL | Status: AC
  Administered 2021-06-21: 40 meq via ORAL
  Filled 2021-06-21: qty 2

## 2021-06-21 MED ORDER — ADULT MULTIVITAMIN W/MINERALS CH
1.0000 | ORAL_TABLET | Freq: Every day | ORAL | Status: DC
  Administered 2021-06-21 – 2021-06-30 (×10): 1 via ORAL
  Filled 2021-06-21 (×10): qty 1

## 2021-06-21 MED ORDER — SODIUM CHLORIDE 0.9% FLUSH
5.0000 mL | Freq: Three times a day (TID) | INTRAVENOUS | Status: DC
Start: 2021-06-21 — End: 2021-07-01
  Administered 2021-06-21 – 2021-06-30 (×17): 5 mL

## 2021-06-21 MED ORDER — BACLOFEN 10 MG PO TABS
5.0000 mg | ORAL_TABLET | Freq: Three times a day (TID) | ORAL | Status: DC
  Administered 2021-06-21 – 2021-06-24 (×10): 5 mg via ORAL
  Filled 2021-06-21 (×10): qty 1

## 2021-06-21 MED ORDER — METOCLOPRAMIDE HCL 5 MG PO TABS: 10.0000 mg | ORAL_TABLET | Freq: Once | ORAL | Status: AC

## 2021-06-21 MED ORDER — ENSURE ENLIVE PO LIQD
237.0000 mL | Freq: Two times a day (BID) | ORAL | Status: DC
  Administered 2021-06-24 – 2021-06-30 (×9): 237 mL via ORAL

## 2021-06-21 MED ORDER — SODIUM CHLORIDE 0.9% FLUSH
5.0000 mL | Freq: Two times a day (BID) | INTRAVENOUS | Status: DC
Start: 2021-06-21 — End: 2021-06-21

## 2021-06-21 MED ORDER — SODIUM CHLORIDE 0.9% FLUSH: 5.0000 mL | Freq: Two times a day (BID) | INTRAVENOUS | Status: DC

## 2021-06-21 NOTE — Care Management (Signed)
Received secure chat from [place] with palliative team , asking if patient can gp to residential hospice and completed a course of antibiotics.    NCM spoke to patient at bedside. He has been to [facility] in past and does not want to return there. NCM explained [facility] is SNF for rehab , not residential hospice. Patient voiced understanding .    NCM called following residential hospices : [facility], [facility], [facility] , [facility] , they cannot take patient on antibiotics ( PO or IV ).   Secure chatted attending and palliative

## 2021-06-21 NOTE — Progress Notes (Addendum)
Referring Physician(s): FMTS, Dr. Jinny Sanders, [facility]   Supervising Physician: Markus Daft  Patient Status:  [facility] - In-pt  Chief Complaint:  S/p RLQ drain placement for intraabdominal abscess with IR on 06/07/21.  Discharged on 11/1, re-admitted on 11/5 for sepsis.   Subjective:  Pt sitting in bed, NAD.  Reports fever and nausea, denies abdominal pain.  States that he has been dealing with leakage around the drain for a while.   Allergies: Patient has no known allergies.  Medications: Prior to Admission medications   Medication Sig Start Date End Date Taking? Authorizing Provider  aspirin 81 MG chewable tablet Chew 1 tablet (81 mg total) by mouth daily. 01/21/21  Yes Gladys Damme, MD  atorvastatin (LIPITOR) 80 MG tablet Take 80 mg by mouth daily.   Yes [provider]  famotidine (PEPCID) 10 MG tablet Take 1 tablet (10 mg total) by mouth daily. 06/16/21  Yes Sowell, Erlene Quan, MD  feeding supplement (ENSURE ENLIVE / ENSURE PLUS) LIQD Take 237 mLs by mouth 3 (three) times daily between meals. 06/15/21  Yes Holley Bouche, MD  ferrous sulfate 325 (65 FE) MG EC tablet Take 1 tablet (325 mg total) by mouth daily with breakfast. 05/12/21  Yes Gatha Mayer, MD  metoCLOPramide (REGLAN) 10 MG tablet Take 1 tablet (10 mg total) by mouth 3 (three) times daily before meals. 06/15/21  Yes Sowell, Erlene Quan, MD  Multiple Vitamin (MULTIVITAMIN WITH MINERALS) TABS tablet Take 1 tablet by mouth daily. 06/16/21  Yes Sowell, Erlene Quan, MD  oxyCODONE 10 MG TABS Take 1 tablet (10 mg total) by mouth every 6 (six) hours. 06/15/21  Yes Holley Bouche, MD     Vital Signs: BP (!) 119/56 (BP Location: Left Arm)   Pulse 96   Temp 98.7 F (37.1 C) (Oral)   Resp 18   Ht 5\' 11"  (1.803 m)   Wt 136 lb 0.4 oz (61.7 kg)   SpO2 97%   BMI 18.97 kg/m   Physical Exam Vitals reviewed.  Constitutional:      General: He is not in acute distress.    Appearance: He is ill-appearing.  HENT:     Head:  Normocephalic and atraumatic.  Pulmonary:     Effort: Pulmonary effort is normal.  Abdominal:     General: Abdomen is flat.     Palpations: Abdomen is soft.  Musculoskeletal:     Cervical back: Normal range of motion.  Skin:    General: Skin is warm and dry.     Coloration: Skin is not jaundiced or pale.     Comments: Positive RLQ drain to a suction bulb. Purulent, green fluid leaking from the drain site. Skin with no erythema. Suture and stat lock in place. Dressing is saturated in purulent drainage. 5 ml of  green colored fluid noted in the bulb.   Neurological:     Mental Status: He is alert and oriented to person, place, and time.  Psychiatric:        Mood and Affect: Mood normal.        Behavior: Behavior normal.    Imaging: CT ABDOMEN PELVIS W CONTRAST  Result Date: 06/19/2021 CLINICAL DATA:  Abdominal pain and fevers, known right iliacus abscess for, status post percutaneous drainage. EXAM: CT ABDOMEN AND PELVIS WITH CONTRAST TECHNIQUE: Multidetector CT imaging of the abdomen and pelvis was performed using the standard protocol following bolus administration of intravenous contrast. CONTRAST:  160mL OMNIPAQUE IOHEXOL 300 MG/ML  SOLN COMPARISON:  06/06/2021 FINDINGS: Lower  chest: No acute abnormality. Hepatobiliary: Liver demonstrates fatty infiltration. A mildly lobulated cyst is noted in the left lobe of the liver stable from prior exam. The gallbladder is well distended. Pancreas: Unremarkable. No pancreatic ductal dilatation or surrounding inflammatory changes. Spleen: Normal in size without focal abnormality. Adrenals/Urinary Tract: Adrenal glands are within normal limits. Kidneys demonstrate a normal enhancement pattern bilaterally. No renal calculi or obstructive changes are seen. Normal excretion of contrast is noted. The bladder is partially distended. Stomach/Bowel: Fecal material is noted throughout the colon. No obstructive changes are seen. There is again noted a diffuse area  of wall thickening involving the cecum and proximal ascending colon again suspicious for malignancy. This appears to be contiguous with the known right lower quadrant abscess involving the iliacus and psoas muscles extending along the anterior aspect of the right hip joint. More air is noted within the collection in the right lower quadrant and a drainage catheter is noted within. The area of inter communication between this and the adjacent thickened cecum is best noted on the coronal image 33 of series 6. Small bowel and stomach appear within normal limits. Vascular/Lymphatic: Aortic atherosclerosis. No enlarged abdominal or pelvic lymph nodes. Reproductive: Prostate is again enlarged indenting upon the inferior aspect of the bladder similar to that seen on the prior exam. Other: There is a large air-fluid collection identified along the course of the right psoas and iliacus muscles. This measures approximately 8.6 x 5.8 cm in greatest dimension and is slightly smaller than that seen on the prior exam when it measured up to 9.4 cm. It extends for approximately 8.6 cm in craniocaudad projection. Pigtail catheter is noted within the collection although the collection continues to extend along the anterior aspect of the right hip joint and proximal right femur as well as apparently communicating with the thickened cecum consistent with focal perforation. No free fluid is noted within the pelvis. Musculoskeletal: Degenerative changes of the lumbar spine are noted. No erosive changes are identified at this time. IMPRESSION: Persistent air-fluid collection identified in the right iliac fossa with percutaneous drainage catheter within. It is slightly smaller than that seen on the prior exam from 06/06/2021. It appears to communicate with the thickened and inflamed cecum as described best noted on the coronal images. Again the possibility of cecal malignancy deserves consideration given the appearance of the cecum and  proximal ascending colon. Remainder of the exam is stable from the recent exam from 06/06/2021. Electronically Signed   By: Inez Catalina M.D.   On: 06/19/2021 20:31   DG Chest Portable 1 View  Result Date: 06/19/2021 CLINICAL DATA:  Possible sepsis. EXAM: PORTABLE CHEST 1 VIEW COMPARISON:  June 07, 2021 FINDINGS: Tortuosity of the aorta. Cardiomediastinal silhouette is normal. Mediastinal contours appear intact. There is no evidence of focal airspace consolidation, pleural effusion or pneumothorax. Osseous structures are without acute abnormality. Soft tissues are grossly normal. IMPRESSION: No active disease. Electronically Signed   By: Fidela Salisbury M.D.   On: 06/19/2021 19:12    Labs:  CBC: Recent Labs    06/14/21 0539 06/19/21 1758 06/20/21 0638 06/21/21 0539  WBC 11.3* 12.4* 10.8* 14.3*  HGB 9.9* 9.9* 8.4* 8.0*  HCT 32.7* 31.9* 27.5* 24.6*  PLT 536* 517* 496* 502*    COAGS: Recent Labs    10/19/20 1350 06/19/21 1758  INR 1.1 1.4*  APTT 38*  --     BMP: Recent Labs    06/14/21 0539 06/19/21 1758 06/20/21 9528 06/21/21 0539  NA 135 132* 131* 131*  K 4.4 4.2 3.9 3.3*  CL 98 97* 98 98  CO2 31 25 23 25   GLUCOSE 95 98 85 105*  BUN 11 6* 8 8  CALCIUM 9.3 8.3* 7.7* 7.7*  CREATININE 0.81 0.66 0.55* 0.63  GFRNONAA >60 >60 >60 >60    LIVER FUNCTION TESTS: Recent Labs    06/12/21 0202 06/13/21 0134 06/14/21 0539 06/19/21 1758  BILITOT 0.6 0.4 0.6 1.1  AST 30 42* 39 21  ALT 38 38 40 21  ALKPHOS 79 77 73 69  PROT 5.8* 6.0* 6.1* 6.0*  ALBUMIN 1.9* 2.0* 2.2* 2.1*    Assessment and Plan:  80 y.o. male with intraabdominal abscess, s/p RLQ drain placement on 10/24 with IR.  Cx grew STREPTOCOCCUS CONSTELLATUS. Patient was d/c'd on 11/1 with the drain in place, re-admitted on 11/5 due to sepsis. IR was consulted for management of RLQ drain.  VSS Leukocytosis, 14.3   Drain Location: RLQ Size: Fr size: 10 Fr Date of placement: 06/07/21  Currently to:  Drain collection device: suction bulb 24 hour output:  Output by Drain (mL) 06/19/21 0701 - 06/19/21 1900 06/19/21 1901 - 06/20/21 0700 06/20/21 0701 - 06/20/21 1900 06/20/21 1901 - 06/21/21 0700 06/21/21 0701 - 06/21/21 1533  Closed System Drain 1 RLQ Bulb (JP) 10 Fr.    10     Interval imaging/drain manipulation:  CT A/P w on 11/5 : persistent, smaller air-fluid collection in the right iliac fossa with perc drain within, with possible communication with inflamed cecum. 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   Current examination: Purulent leakage around the drain insertion site.  Insertion site  otherwise unremarkable. Suture and stat lock in place. Dressed appropriately.   Plan: Discussed with Dr. Anselm Pancoast, drain appeared to be in good position on  CT 11/5, the leakage mostly likely due to drain occlusion from not being flushed.  However, due to extensiveness of drainage, IR will plan on drain exchange and possible upsize tomorrow.   Continue TID flushes with 5 cc NS. Record output Q shift. Dressing changes once in 2-3 days or PRN if soiled.  Call IR APP or on call IR MD if difficulty flushing or sudden change in drain output.  Repeat imaging/possible drain injection once output < 10 mL/QD (excluding flush material.)  IR will continue to follow - please call with questions or concerns.   Electronically Signed: Tera Mater, PA-C 06/21/2021, 3:18 PM   I spent a total of 15 Minutes at the the patient's bedside AND on the patient's hospital floor or unit, greater than 50% of which was counseling/coordinating care for RLQ drain.   This chart was dictated using voice recognition software.  Despite best efforts to proofread,  errors can occur which can change the documentation meaning.

## 2021-06-21 NOTE — Progress Notes (Signed)
Mobility Specialist Progress Note:   06/21/21 1500  Mobility  Activity Sat and stood x 3 (x1)  Level of Assistance Maximum assist, patient does 25-49%  Mobility Response Tolerated fair  Mobility performed by Mobility specialist  Bed Position Chair  $Mobility charge 1 Mobility   Pt unable to use RLE to stand. Further mobility deferred d/t JP drain leaking down leg. RN notified. Pt left in chair with chair alarm on.   Nelta Numbers Mobility Specialist  Phone [phone]

## 2021-06-21 NOTE — Progress Notes (Signed)
Family Medicine Teaching Service Daily Progress Note Intern Pager: [phone]  Patient name: Albert Love record number: [id number] Date of birth: 1940/09/02 Age: 80 y.o. 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  Sepsis due to intra-abdominal infection  abdominal pain  cecal mass  Tachycardia improved to 80s to 90s most recently.  Did have 1 elevated temperature of 102.3 last night.  Received Tylenol after this and later in the morning at 0300.  White blood cell count elevated at 14.3 from 10.8.  Blood culture no growth <24 hours. UA negative. Second opinion from surgery yesterday assessment was similar that it is an operable.  JP drain is draining bilious foul smellling fluid.  Complains of hiccups last night which he complains of this AM and says he "stops breathing for 5-6 seconds" when this happens and it scares him. He says he has been having fecal incontinence and denying bowel reg meds. 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  Signed MOST form at last visit. -Palliative care consulted, appreciate recommendations  Macrocytic anemia and thrombocytopenia Likely secondary to GI dsiease. Hgb 8 -transfusion threshold Hgb<7  Cachexia with hypoalbuminemia and protein calorie malnutrition -RD consulted  Osteopenia -PT to follow today  Enlarged prostate UOP 0.3 ml/kg/hr -Bladder scans -Monitor for urinary obstruction  Central retinal artery occlusion right eye  hyperlipidemia for Hypertension Stable  FEN/GI: Regular PPx: SCDs Dispo:SNF pending clinical improvement .   Subjective:  Main complaint of hiccups today and feeling like he can't breathe during them.  Objective: Temp:  [98.7 F (37.1 C)-102.3 F (39.1 C)] 98.7 F (37.1 C) (11/07 0754) Pulse Rate:  [74-101] 96 (11/07 0754) Resp:  [17-38] 18 (11/07 0754) BP: (107-150)/(55-93) 119/56 (11/07 0754) SpO2:  [96 %-99 %] 97 % (11/07 0754) Physical Exam: General: Ill appearing, responsive to questions, alert, awake Cardiovascular: RRR no m/r/g Respiratory: CTAB Abdomen: nondistended, drain in place with bilious foul smelling fluid, bandage in place Extremities: No swelling in LE   Laboratory: Recent Labs  Lab 06/19/21 1758 06/20/21 0638 06/21/21 0539  WBC 12.4* 10.8* 14.3*  HGB 9.9* 8.4* 8.0*  HCT 31.9* 27.5* 24.6*  PLT 517* 496* 502*   Recent Labs  Lab 06/19/21 1758 06/20/21 0638 06/21/21 0539  NA 132* 131* 131*  K 4.2 3.9 3.3*  CL 97* 98 98  CO2 25 23 25   BUN 6* 8  8  CREATININE 0.66 0.55* 0.63  CALCIUM 8.3* 7.7* 7.7*  PROT 6.0*  --   --   BILITOT 1.1  --   --   ALKPHOS  69  --   --   ALT 21  --   --   AST 21  --   --   GLUCOSE 98 85 105*    Imaging/Diagnostic Tests:   Albert Heck, MD 06/21/2021, 8:05 AM PGY-1, [place] Intern pager: [phone], text pages welcome

## 2021-06-21 NOTE — Progress Notes (Signed)
Patient ID: Albert Love, male   DOB: 16-Feb-1941, 80 y.o.   MRN: [id number] [facility] Surgery Progress Note     Subjective: He continues to have hiccups which are most bothersome. He has had multiple Bms without enemas and abdominal pain is significantly improved. No nausea or emesis  Objective: Vital signs in last 24 hours: Temp:  [98.7 F (37.1 C)-102.3 F (39.1 C)] 98.7 F (37.1 C) (11/07 0754) Pulse Rate:  [74-101] 96 (11/07 0754) Resp:  [17-38] 18 (11/07 0754) BP: (107-150)/(55-93) 119/56 (11/07 0754) SpO2:  [96 %-99 %] 97 % (11/07 0754) Last BM Date: 06/20/21  Intake/Output from previous day: 11/06 0701 - 11/07 0700 In: 1057.4 [I.V.:969.6; IV Piggyback:87.8] Out: 411 [Urine:400; Drains:10; Stool:1] Intake/Output this shift: No intake/output data recorded.  PE: Gen:  Alert, NAD, cachectic  HEENT: EOM's intact, pupils equal and round. Dentures Card:  RRR Pulm: rate and effort normal on room air Abd: Soft, mild tenderness around drain otherwise nontender, ND, drain with feculent fluid in bulb Psych: A&Ox3 Skin: no rashes noted, warm and dry  Lab Results:  Recent Labs    06/20/21 0638 06/21/21 0539  WBC 10.8* 14.3*  HGB 8.4* 8.0*  HCT 27.5* 24.6*  PLT 496* 502*    BMET Recent Labs    06/20/21 0638 06/21/21 0539  NA 131* 131*  K 3.9 3.3*  CL 98 98  CO2 23 25  GLUCOSE 85 105*  BUN 8 8  CREATININE 0.55* 0.63  CALCIUM 7.7* 7.7*    PT/INR Recent Labs    06/19/21 1758  LABPROT 17.2*  INR 1.4*    CMP     Component Value Date/Time   NA 131 (L) 06/21/2021 0539   K 3.3 (L) 06/21/2021 0539   CL 98 06/21/2021 0539   CO2 25 06/21/2021 0539   GLUCOSE 105 (H) 06/21/2021 0539   BUN 8 06/21/2021 0539   CREATININE 0.63 06/21/2021 0539   CALCIUM 7.7 (L) 06/21/2021 0539   PROT 6.0 (L) 06/19/2021 1758   ALBUMIN 2.1 (L) 06/19/2021 1758   AST 21 06/19/2021 1758   ALT 21 06/19/2021 1758   ALKPHOS 69 06/19/2021 1758   BILITOT 1.1 06/19/2021 1758    GFRNONAA >60 06/21/2021 0539   GFRAA >60 11/02/2019 0026   Lipase     Component Value Date/Time   LIPASE 23 06/05/2021 2328       Studies/Results: CT ABDOMEN PELVIS W CONTRAST  Result Date: 06/19/2021 CLINICAL DATA:  Abdominal pain and fevers, known right iliacus abscess for, status post percutaneous drainage. EXAM: CT ABDOMEN AND PELVIS WITH CONTRAST TECHNIQUE: Multidetector CT imaging of the abdomen and pelvis was performed using the standard protocol following bolus administration of intravenous contrast. CONTRAST:  173mL OMNIPAQUE IOHEXOL 300 MG/ML  SOLN COMPARISON:  06/06/2021 FINDINGS: Lower chest: No acute abnormality. Hepatobiliary: Liver demonstrates fatty infiltration. A mildly lobulated cyst is noted in the left lobe of the liver stable from prior exam. The gallbladder is well distended. Pancreas: Unremarkable. No pancreatic ductal dilatation or surrounding inflammatory changes. Spleen: Normal in size without focal abnormality. Adrenals/Urinary Tract: Adrenal glands are within normal limits. Kidneys demonstrate a normal enhancement pattern bilaterally. No renal calculi or obstructive changes are seen. Normal excretion of contrast is noted. The bladder is partially distended. Stomach/Bowel: Fecal material is noted throughout the colon. No obstructive changes are seen. There is again noted a diffuse area of wall thickening involving the cecum and proximal ascending colon again suspicious for malignancy. This appears to be contiguous with  the known right lower quadrant abscess involving the iliacus and psoas muscles extending along the anterior aspect of the right hip joint. More air is noted within the collection in the right lower quadrant and a drainage catheter is noted within. The area of inter communication between this and the adjacent thickened cecum is best noted on the coronal image 33 of series 6. Small bowel and stomach appear within normal limits. Vascular/Lymphatic: Aortic  atherosclerosis. No enlarged abdominal or pelvic lymph nodes. Reproductive: Prostate is again enlarged indenting upon the inferior aspect of the bladder similar to that seen on the prior exam. Other: There is a large air-fluid collection identified along the course of the right psoas and iliacus muscles. This measures approximately 8.6 x 5.8 cm in greatest dimension and is slightly smaller than that seen on the prior exam when it measured up to 9.4 cm. It extends for approximately 8.6 cm in craniocaudad projection. Pigtail catheter is noted within the collection although the collection continues to extend along the anterior aspect of the right hip joint and proximal right femur as well as apparently communicating with the thickened cecum consistent with focal perforation. No free fluid is noted within the pelvis. Musculoskeletal: Degenerative changes of the lumbar spine are noted. No erosive changes are identified at this time. IMPRESSION: Persistent air-fluid collection identified in the right iliac fossa with percutaneous drainage catheter within. It is slightly smaller than that seen on the prior exam from 06/06/2021. It appears to communicate with the thickened and inflamed cecum as described best noted on the coronal images. Again the possibility of cecal malignancy deserves consideration given the appearance of the cecum and proximal ascending colon. Remainder of the exam is stable from the recent exam from 06/06/2021. Electronically Signed   By: Inez Catalina M.D.   On: 06/19/2021 20:31   DG Chest Portable 1 View  Result Date: 06/19/2021 CLINICAL DATA:  Possible sepsis. EXAM: PORTABLE CHEST 1 VIEW COMPARISON:  June 07, 2021 FINDINGS: Tortuosity of the aorta. Cardiomediastinal silhouette is normal. Mediastinal contours appear intact. There is no evidence of focal airspace consolidation, pleural effusion or pneumothorax. Osseous structures are without acute abnormality. Soft tissues are grossly normal.  IMPRESSION: No active disease. Electronically Signed   By: Fidela Salisbury M.D.   On: 06/19/2021 19:12    Anti-infectives: Anti-infectives (From admission, onward)    Start     Dose/Rate Route Frequency Ordered Stop   06/20/21 0515  piperacillin-tazobactam (ZOSYN) IVPB 3.375 g       See Hyperspace for full Linked Orders Report.   3.375 g 12.5 mL/hr over 240 Minutes Intravenous Every 8 hours 06/19/21 2102     06/19/21 2115  piperacillin-tazobactam (ZOSYN) IVPB 3.375 g       See Hyperspace for full Linked Orders Report.   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 Continue drain - he appears constipated on CT. Now having Bms. Enemas and suppositories PRN. No laxatives PO - thorazine for hiccups - recommend having palliative see again while he is here  We will sign off but please do not hesitate to contact us with any questions or concerns or reconsult if needed.   FEN:  reg diet, bowel regimen ID: Zosyn  VTE: Okay for chemical prophylaxis from our standpoint   IDA HTN Protein calorie malnutrition H/o CRAO   LOS: 2 days    Winferd Humphrey, [facility] 06/21/2021, 9:16 AM Please see Amion for pager number during day hours 7:00am-4:30pm

## 2021-06-21 NOTE — Progress Notes (Signed)
FPTS Brief Progress Note  S:Patient laying comfortably in bed, complaining of hiccups. Nurse to change bandage tonight.    O: BP (!) 117/93 (BP Location: Left Arm)   Pulse 82   Temp 98.8 F (37.1 C) (Oral)   Resp 18   Ht 5\' 11"  (1.803 m)   Wt 61.7 kg   SpO2 99%   BMI 18.97 kg/m     A/P: Hiccups -Patient complaining of hiccups and having interrupted speech while talking, 2/2 hiccups.  -Reglan 10 mg  - Orders reviewed. Labs for AM ordered, which was adjusted as needed.   Holley Bouche, MD 06/21/2021, 1:17 AM PGY-1, [facility] Night Resident  Please page [phone] with questions.

## 2021-06-21 NOTE — NC FL2 (Signed)
[place] LEVEL OF CARE SCREENING TOOL     IDENTIFICATION  Patient Name: Albert Love Birthdate: [birth year] Sex: male Admission Date (Current Location): 06/19/2021  [facility] and [place] Number:  Herbalist and Address:  [facility] [place] [address], [place], [place] [address]      Provider Number: [id number]  Attending Physician Name and Address:  Kinnie Feil, MD  Relative Name and Phone Number:  Bethanie Dicker   [phone]    Current Level of Care: Hospital Recommended Level of Care: [place] Prior Approval Number:    Date Approved/Denied:   PASRR Number: [id number]  Discharge Plan: SNF    Current Diagnoses: Patient Active Problem List   Diagnosis Date Noted   Intra-abdominal abscess ([place]) 06/19/2021   Leukocytosis 06/19/2021   Sepsis ([place]) 06/19/2021   Hospice care patient 06/15/2021   Palliative care patient 06/15/2021   Constipation    Failure to thrive in adult    Abscess    Protein-calorie malnutrition, severe 06/07/2021   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   Pneumoperitoneum 09/05/2019   Acute renal insufficiency 09/05/2019   Perforated gastric ulcer s/p omental [name] patch 09/05/2019 09/05/2019   Coagulopathy ([place]) 09/05/2019    Orientation RESPIRATION BLADDER Height & Weight     Self, Time, Situation, Place  Normal Incontinent, External catheter Weight: 136 lb 0.4 oz (61.7 kg) Height:  5\' 11"  (180.3 cm)  BEHAVIORAL SYMPTOMS/MOOD NEUROLOGICAL BOWEL NUTRITION STATUS      Continent Diet (see discharge summary)  AMBULATORY STATUS COMMUNICATION OF NEEDS Skin   Extensive Assist Verbally Normal                        Personal Care Assistance Level of Assistance  Bathing, Feeding, Dressing Bathing Assistance: Limited assistance Feeding assistance: Limited assistance Dressing Assistance: Limited assistance     Functional Limitations Info  Sight, Hearing, Speech Sight Info: Adequate Hearing Info: Adequate Speech Info: Adequate    SPECIAL CARE FACTORS FREQUENCY  PT (By licensed PT), OT (By licensed OT)     PT Frequency: 5x week OT Frequency: 5x week            Contractures Contractures Info: Not present    Additional Factors Info  Code Status, Allergies Code Status Info: DNR Allergies Info: NKA           Current Medications (06/21/2021):  This is the current hospital active medication list Current Facility-Administered Medications  Medication Dose Route Frequency Provider Last Rate Last Admin   0.9 %  sodium chloride infusion   Intravenous Continuous Gladys Damme, MD 100 mL/hr at 06/21/21 0700 New Bag at 06/21/21 0700   acetaminophen (TYLENOL) tablet 650 mg  650 mg Oral Q6H PRN Gladys Damme, MD   650 mg at 06/21/21 1111   baclofen (LIORESAL) tablet 5 mg  5 mg Oral TID Lyndee Hensen, DO   5 mg at 06/21/21 1226   bisacodyl (DULCOLAX) suppository 10 mg  10 mg Rectal Daily PRN Meuth, Brooke A, PA-C       fentaNYL (SUBLIMAZE) injection 12.5-50 mcg  12.5-50 mcg Intravenous Q2H PRN Gladys Damme, MD   50 mcg at 06/20/21 0239   oxyCODONE (  Oxy IR/ROXICODONE) immediate release tablet 5 mg  5 mg Oral Q4H PRN Gladys Damme, MD       piperacillin-tazobactam (ZOSYN) IVPB 3.375 g  3.375 g Intravenous Q8H Gladys Damme, MD 12.5 mL/hr at 06/21/21 1227 3.375 g at 06/21/21 1227   promethazine (PHENERGAN) tablet 12.5 mg  12.5 mg Oral Q6H PRN Gladys Damme, MD       sodium chloride flush (NS) 0.9 % injection 5 mL  5 mL Intracatheter Q8H Han, Aimee H, PA-C         Discharge Medications: Please see discharge summary for a list of discharge  medications.  Relevant Imaging Results:  Relevant Lab Results:   Additional Information SSN: [id number].  Pt is not vaccinated for covid.  Coralee Pesa, LCSWA

## 2021-06-21 NOTE — Progress Notes (Signed)
Spoke with on call IR physician Dr. Anselm Pancoast about patient and IR will evaluate JP drain and whether it needs replacement.

## 2021-06-21 NOTE — Evaluation (Addendum)
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  Pt had JP drain in place on arrival since 10/24 and still has drain.  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  Clinical Impression  Pt admitted with above diagnosis. Pt was able to stand and pivot to chair but could not progress ambulation. Lives alone and needs to be able to ambulate without assist. Will benefit from PT at SNF to get stronger.   Pt currently with functional limitations due to the deficits listed below (see PT Problem List). Pt will benefit from skilled PT to increase their independence and safety with mobility to allow discharge to the venue listed below.          Recommendations for follow up therapy are one component of a multi-disciplinary discharge planning process, led by the attending physician.  Recommendations may be updated based on patient status, additional functional criteria and insurance authorization.  Follow Up Recommendations Skilled nursing-short term rehab (<3 hours/day)    Assistance Recommended at Discharge Frequent or constant Supervision/Assistance  Functional Status Assessment Patient has had a recent decline in their functional status and demonstrates the ability to make significant improvements in function in a reasonable and predictable amount of time.  Equipment Recommendations  Rolling walker (2 wheels);Hospital bed     Recommendations for Other Services       Precautions / Restrictions Precautions Precautions: Fall Restrictions Weight Bearing Restrictions: No      Mobility  Bed Mobility Overal bed mobility: Needs Assistance Bed Mobility: Supine to Sit Rolling: Min assist Sidelying to sit: Min assist Supine to sit: Min assist     General bed mobility comments: verbal cues for technique    Transfers Overall transfer level: Needs assistance Equipment used: Rolling walker (2 wheels) Transfers: Sit to/from Stand;Bed to chair/wheelchair/BSC Sit to Stand: Mod assist;Min assist Stand pivot transfers: Min assist;Mod assist         General transfer comment: min assist to power up with mod cues and then took pivotal steps to chair with mod cues to sequence steps and RW.    Ambulation/Gait                  Stairs            Wheelchair Mobility    Modified Rankin (Stroke Patients Only)       Balance Overall balance assessment: Needs assistance Sitting-balance support: No upper extremity supported;Feet supported Sitting balance-Leahy Scale: Fair     Standing balance support: Bilateral upper extremity supported;During functional activity Standing balance-Leahy Scale: Poor Standing balance comment: Needs UE and external support                             Pertinent Vitals/Pain Pain Assessment: No/denies pain    Home Living Family/patient expects to be discharged to:: Private residence Living Arrangements: Alone Available Help at Discharge: Family;Available PRN/intermittently Type of Home: Apartment Home Access: Elevator (7th [facility] of [facility])       Home Layout: One level  Home Equipment: Grab bars - toilet;Grab bars - tub/shower      Prior Function Prior Level of Function : Independent/Modified Independent;Patient poor historian/Family not available               ADLs Comments: Pt uses cab services     Hand Dominance   Dominant  Hand: Right    Extremity/Trunk Assessment   Upper Extremity Assessment Upper Extremity Assessment: Defer to OT evaluation    Lower Extremity Assessment Lower Extremity Assessment: Generalized weakness    Cervical / Trunk Assessment Cervical / Trunk Assessment: Kyphotic  Communication   Communication: No difficulties  Cognition Arousal/Alertness: Awake/alert Behavior During Therapy: WFL for tasks assessed/performed Overall Cognitive Status: Within Functional Limits for tasks assessed                                          General Comments      Exercises     Assessment/Plan    PT Assessment Patient needs continued PT services  PT Problem List Decreased strength;Decreased activity tolerance;Decreased balance;Decreased mobility;Decreased knowledge of use of DME       PT Treatment Interventions DME instruction;Gait training;Functional mobility training;Therapeutic activities;Therapeutic exercise;Balance training;Patient/family education    PT Goals (Current goals can be found in the Care Plan section)  Acute Rehab PT Goals Patient Stated Goal: wants to be able to walk to the bathroom PT Goal Formulation: With patient Time For Goal Achievement: 07/05/21 Potential to Achieve Goals: Fair    Frequency Min 2X/week   Barriers to discharge Decreased caregiver support      Co-evaluation               AM-PAC PT "6 Clicks" Mobility  Outcome Measure Help needed turning from your back to your side while in a flat bed without using bedrails?: A Little Help needed moving from lying on your back to sitting on the side of a flat bed without using bedrails?: A Little Help needed moving to and from a bed to a chair (including a wheelchair)?: A Lot Help needed standing up from a chair using your arms (e.g., wheelchair or bedside chair)?: A Lot Help needed to walk in hospital room?: A Little Help needed climbing 3-5 steps with a railing? : Total 6 Click  Score: 14    End of Session Equipment Utilized During Treatment: Gait belt Activity Tolerance: Patient limited by fatigue Patient left: in chair;with call bell/phone within reach;with chair alarm set;with nursing/sitter in room;with family/visitor present Nurse Communication: Mobility status PT Visit Diagnosis: Muscle weakness (generalized) (M62.81);Difficulty in walking, not elsewhere classified (R26.2)    Time: 5852-7782 PT Time Calculation (min) (ACUTE ONLY): 24 min   Charges:   PT Evaluation $PT Eval Moderate Complexity: 1 Mod PT Treatments $Therapeutic Activity: 8-22 mins        Albert Love M,PT Acute Rehab Services [phone] (pager)   [name] 06/21/2021, 2:26 PM

## 2021-06-21 NOTE — Progress Notes (Signed)
Initial Nutrition Assessment  DOCUMENTATION CODES:   Severe malnutrition in context of chronic illness  INTERVENTION:   Multivitamin w/ minerals daily  Ensure Enlive po BID, each supplement provides 350 kcal and 20 grams of protein  Provide chopped meats on all pt tray  Encourage good PO intake  NUTRITION DIAGNOSIS:   Severe Malnutrition related to chronic illness (colonic mass) as evidenced by severe muscle depletion, severe fat depletion.  GOAL:   Patient will meet greater than or equal to 90% of their needs  MONITOR:   PO intake, Supplement acceptance, I & O's, Labs  REASON FOR ASSESSMENT:   Consult Assessment of nutrition requirement/status  ASSESSMENT:   80 y.o. male presented to the ED with fatigue, weakness, fever, abdominal pain, and increase output of right JP drain. Pt known to service from most recent admission on 10/22-11/1. PMH includes colonic mass, HTN, abdominal abscess, and malnutrition. Pt admitted with sepsis due to intra-abdominal infection.   Pt reports that his appetite comes and goes but will not last for long when he gets it. Also reports that he will get full very fast. Pt reports that the food at the SNF was not good and that he was not eating a lot while there. Pt reports the typical items served as; Breakfast: eggs and bacon, Lunch: chicken or fish and mashed potatoes, Dinner: chicken or fish and mac and cheese. Pt reports that he has top and bottom dentures but they do not fit well, especially without the adhesive.   Pt is unsure of his UBW, but believes that he has lost weight recently. Per EMR, pt has not had any significant weight loss within the past year.   Discussed ONS with pt; pt agreeable to drink Ensure. Also talked about providing pt with chopped meats on pt tray; pt agreed and thinks that will help intake.  Palliative is following.  Medications reviewed and include: Reglan, IV antibiotics Labs reviewed: Sodium 131, Potassium 3.3,  Calcium 7.7  NUTRITION - FOCUSED PHYSICAL EXAM:  Flowsheet Row Most Recent Value  Orbital Region Severe depletion  Upper Arm Region Severe depletion  Thoracic and Lumbar Region Severe depletion  Buccal Region Severe depletion  Temple Region Severe depletion  Clavicle Bone Region Severe depletion  Clavicle and Acromion Bone Region Severe depletion  Scapular Bone Region Severe depletion  Dorsal Hand Severe depletion  Patellar Region Severe depletion  Anterior Thigh Region Severe depletion  Posterior Calf Region Severe depletion  Edema (RD Assessment) None  Hair Reviewed  Eyes Reviewed  Mouth Reviewed  Skin Reviewed  Nails Reviewed       Diet Order:   Diet Order             Diet regular Room service appropriate? Yes; Fluid consistency: Thin  Diet effective now                   EDUCATION NEEDS:   No education needs have been identified at this time  Skin:  Skin Assessment: Reviewed RN Assessment  Last BM:  06/20/2021  Height:   Ht Readings from Last 1 Encounters:  06/19/21 _0  (1.803 m)    Weight:   Wt Readings from Last 1 Encounters:  06/19/21 61.7 kg    Ideal Body Weight:  78.2 kg  BMI:  Body mass index is 18.97 kg/m.  Estimated Nutritional Needs:   Kcal:  1900-2100  Protein:  95-110 grams  Fluid:  > 1.9 L    Tisheena Maguire BS, PLDN Clinical  Dietitian See Shea Evans for contact information.
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
## 2021-06-22 ENCOUNTER — Inpatient Hospital Stay (HOSPITAL_COMMUNITY): Payer: Medicare Other

## 2021-06-22 DIAGNOSIS — Z66 Do not resuscitate: Secondary | ICD-10-CM

## 2021-06-22 DIAGNOSIS — Z515 Encounter for palliative care: Secondary | ICD-10-CM

## 2021-06-22 DIAGNOSIS — R627 Adult failure to thrive: Secondary | ICD-10-CM | POA: Diagnosis not present

## 2021-06-22 DIAGNOSIS — Z7189 Other specified counseling: Secondary | ICD-10-CM

## 2021-06-22 DIAGNOSIS — K651 Peritoneal abscess: Secondary | ICD-10-CM | POA: Diagnosis not present

## 2021-06-22 HISTORY — PX: IR CATHETER TUBE CHANGE: IMG717

## 2021-06-22 LAB — CBC
HCT: 25.5 % — ABNORMAL LOW (ref 39.0–52.0)
Hemoglobin: 8.1 g/dL — ABNORMAL LOW (ref 13.0–17.0)
MCH: 20.8 pg — ABNORMAL LOW (ref 26.0–34.0)
MCHC: 31.8 g/dL (ref 30.0–36.0)
MCV: 65.6 fL — ABNORMAL LOW (ref 80.0–100.0)
Platelets: 476 10*3/uL — ABNORMAL HIGH (ref 150–400)
RBC: 3.89 MIL/uL — ABNORMAL LOW (ref 4.22–5.81)
RDW: 18.3 % — ABNORMAL HIGH (ref 11.5–15.5)
WBC: 14.6 10*3/uL — ABNORMAL HIGH (ref 4.0–10.5)
nRBC: 0 % (ref 0.0–0.2)

## 2021-06-22 LAB — BASIC METABOLIC PANEL
Anion gap: 8 (ref 5–15)
BUN: 6 mg/dL — ABNORMAL LOW (ref 8–23)
CO2: 25 mmol/L (ref 22–32)
Calcium: 7.8 mg/dL — ABNORMAL LOW (ref 8.9–10.3)
Chloride: 101 mmol/L (ref 98–111)
Creatinine, Ser: 0.63 mg/dL (ref 0.61–1.24)
GFR, Estimated: >60 mL/min (ref >60)
Glucose, Bld: 99 mg/dL (ref 70–99)
Potassium: 3.5 mmol/L (ref 3.5–5.1)
Sodium: 134 mmol/L — ABNORMAL LOW (ref 135–145)

## 2021-06-22 IMAGING — XA IR CATHETER TUBE CHANGE
7 series · 14 of 14 positions shown · non-contrast
Comparison: none

INDICATION: 80-year-old with a right colonic mass and large retroperitoneal
abscess with existing drain. Patient is having persistent fluid
leakage around the drainage catheter.

[Series 1: single · 1 of 1 slices shown (1 of 2)]
[im 1/1]
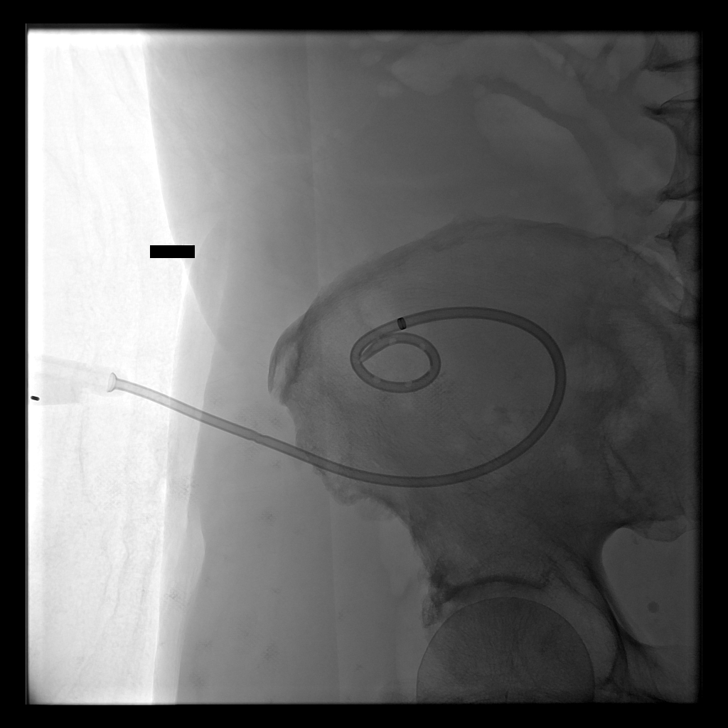

[Series 2: fl (-) angio · 4 of 24 frames shown (1 of 4)]
[frame 2/24]
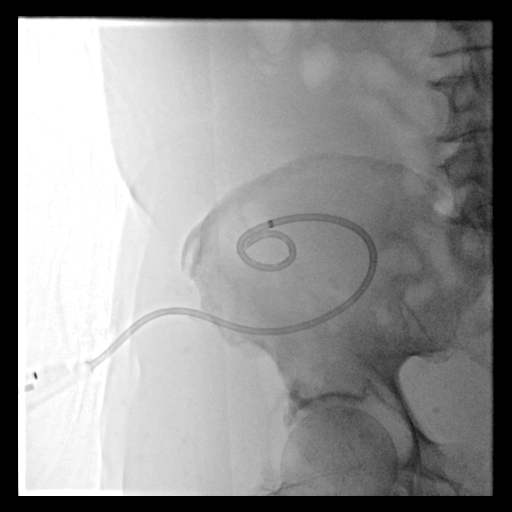
[frame 4/24]
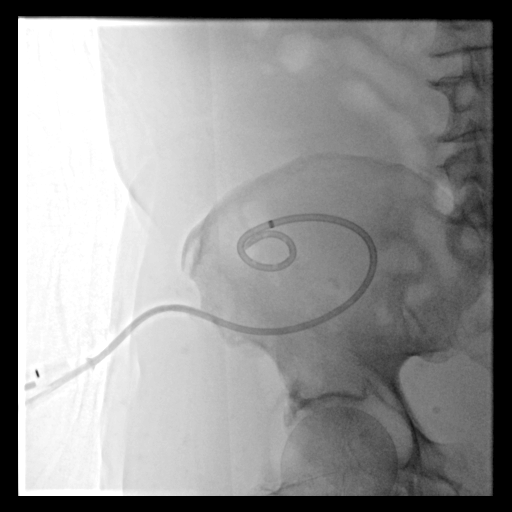
[frame 13/24]
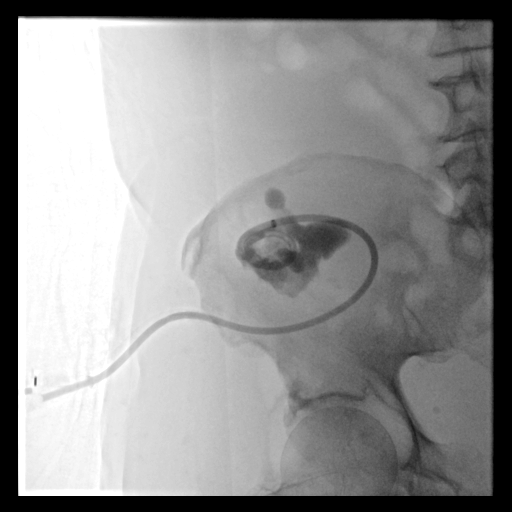
[frame 21/24]
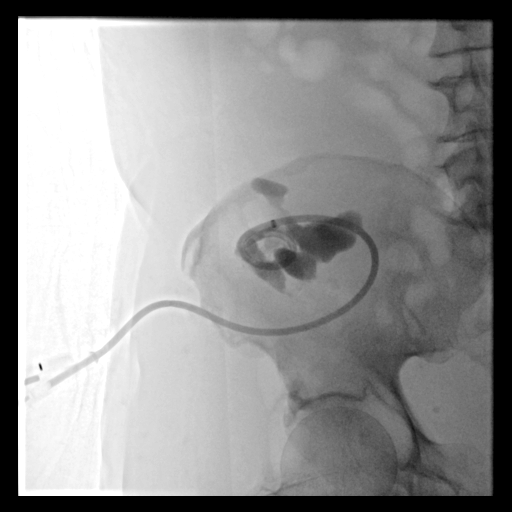

[Series 3: body 4 care · 2 of 2 frames shown]
[frame 1/2]
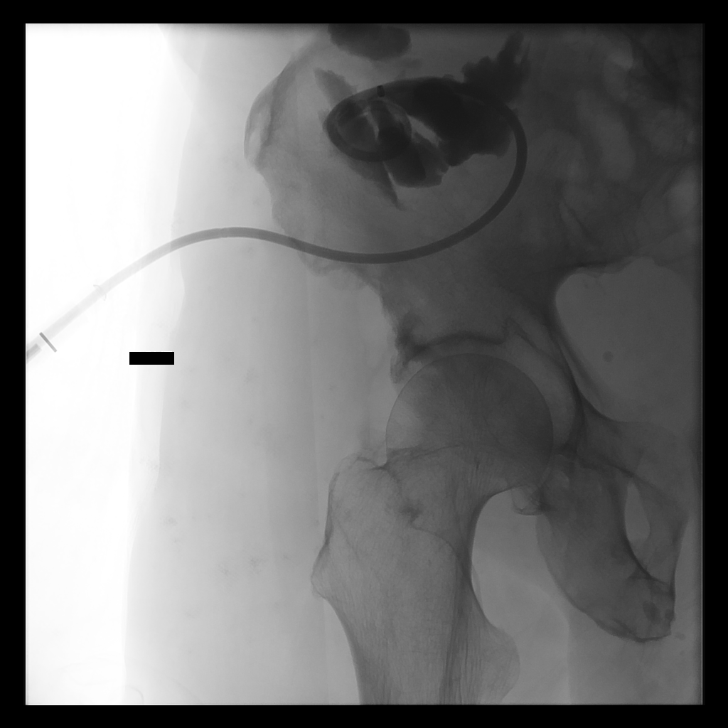
[frame 2/2]
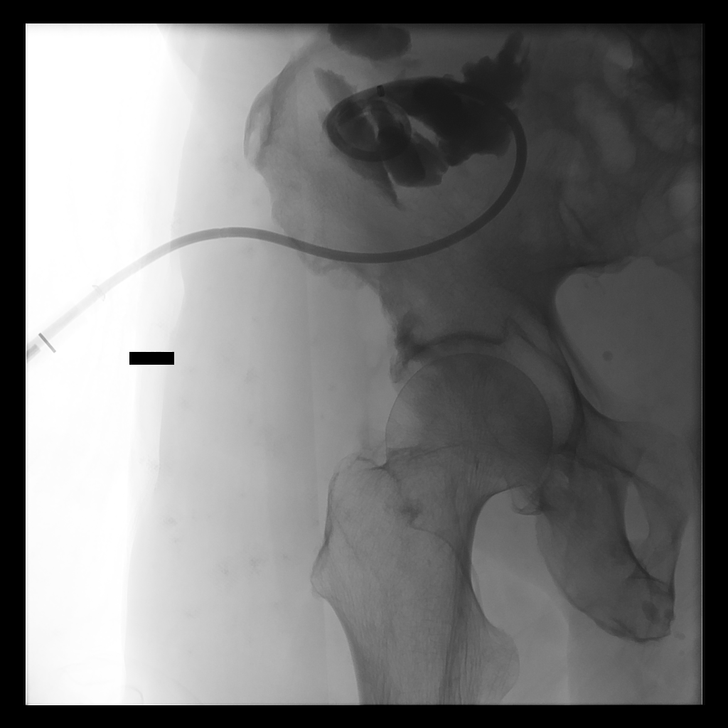

[Series 4: single · 1 of 1 slices shown (2 of 2)]
[im 1/1]
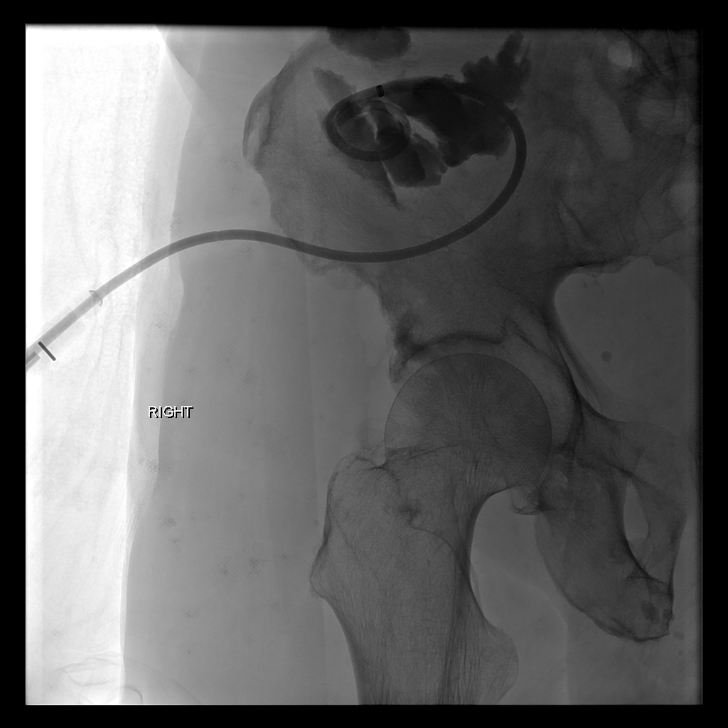

[Series 5: fl (-) angio · 1 of 1 slices shown (2 of 4)]
[im 1/1]
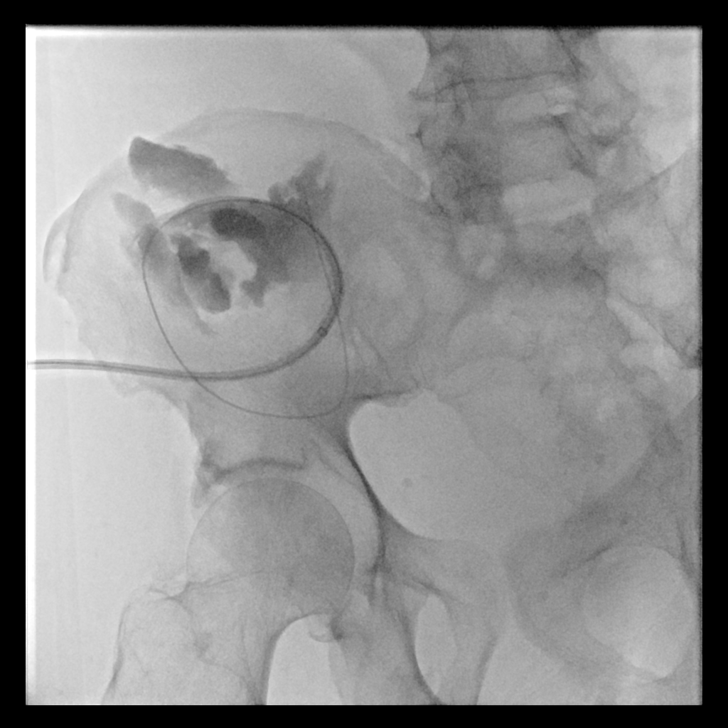

[Series 6: fl (-) angio · 1 of 1 slices shown (3 of 4)]
[im 1/1]
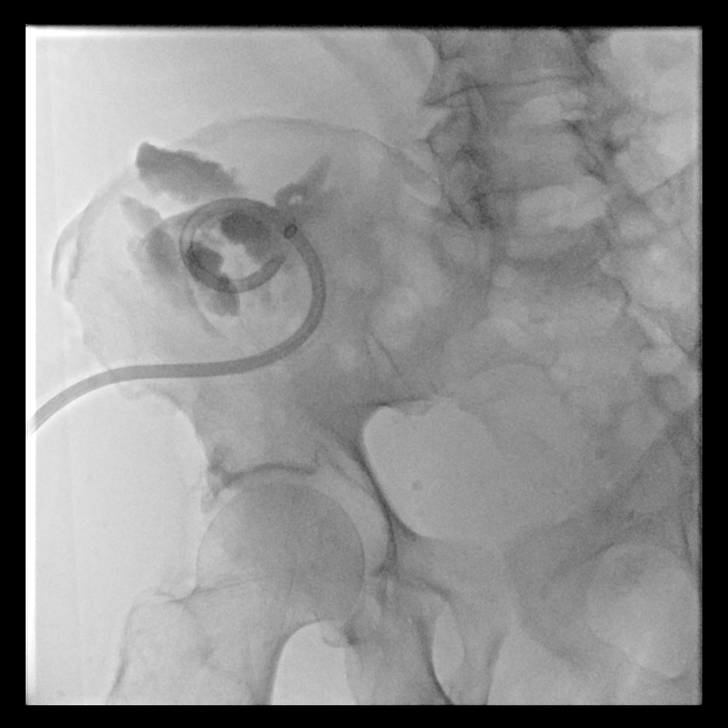

[Series 7: fl (-) angio · 4 of 34 frames shown (4 of 4)]
[frame 2/34]
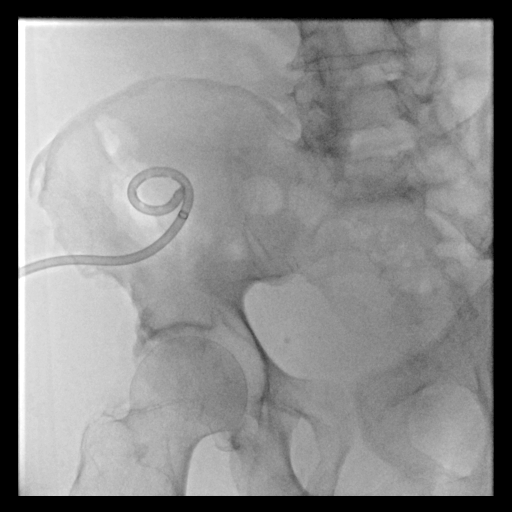
[frame 6/34]
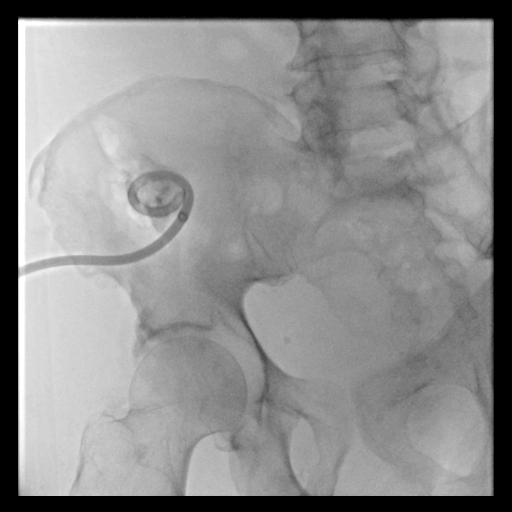
[frame 18/34]
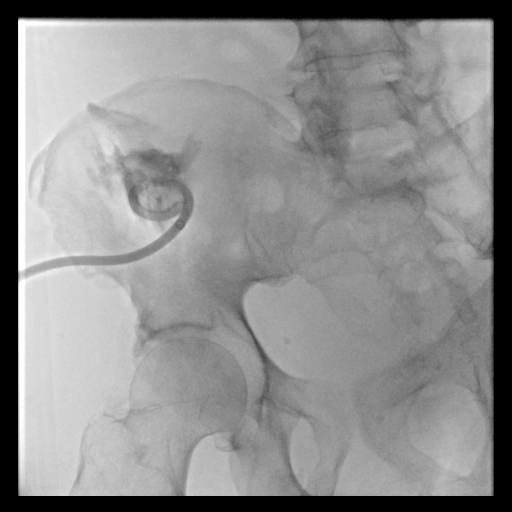
[frame 29/34]
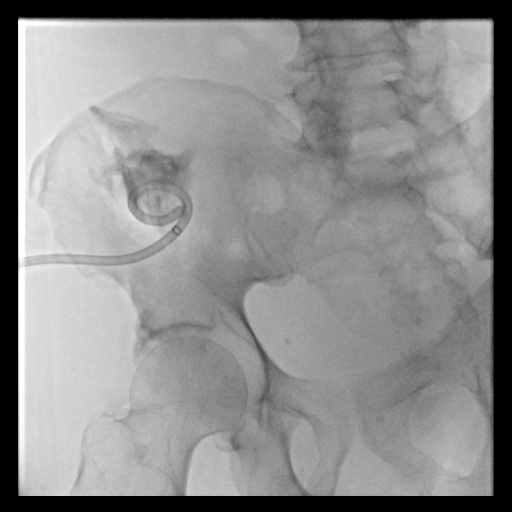

[14 of 14 positions shown; findings below may reference images not displayed]

EXAM:
1. Drain injection
2. Drain exchange with up sizing

MEDICATIONS:
Local anesthetic, 1% lidocaine

ANESTHESIA/SEDATION:
None

FLUOROSCOPY TIME:  48 seconds, 2 mGy

CONTRAST:  15 mL Omnipaque 300

COMPLICATIONS:
None immediate.

PROCEDURE:
Informed written consent was obtained from the patient after a
thorough discussion of the procedural risks, benefits and
alternatives. All questions were addressed. A timeout was performed
prior to the initiation of the procedure.

Patient was placed supine. The right lower abdomen and existing
drain were prepped and draped in sterile fashion. Maximal barrier
sterile technique was utilized including caps, mask, sterile gowns,
sterile gloves, sterile drape, hand hygiene and skin antiseptic.

Contrast was injected through the drain. The drain was cut and
removed over a Bentson wire. A new 14 French pigtail drain was
easily advanced over the wire and placed within the large abscess
cavity. Approximately 100 mL of yellow purulent fluid was aspirated
from the collection. Gas and bloody fluid was aspirated after all of
the yellow fluid was removed. Skin around the catheter was
anesthetized with 1% lidocaine and the catheter was sutured to skin
with Prolene suture.
FINDINGS: Large residual abscess cavity in the right lower abdomen
corresponding with the previous CT findings.

Approximately 100 mL of predominantly yellow fluid was removed along
with a large amount of gas. There is likely a colonic fistula which
was not identified with the drain injection.
IMPRESSION: 1. Successful exchange and up sizing of the right lower abdominal
abscess drain. The patient now has a 14 French drain.

## 2021-06-22 IMAGING — CT CT ABD-PELV W/ CM
2 of 5 series · 13 of 46 positions shown, 15 images · IV contrast (APPLIED)
Comparison: CT abdomen pelvis-[DATE];

CLINICAL DATA: Pericecal/retroperitoneal abscess, post percutaneous
drainage catheter placement on [DATE]

EXAM:
CT ABDOMEN AND PELVIS WITH CONTRAST
TECHNIQUE: Multidetector CT imaging of the abdomen and pelvis was performed
using the standard protocol following bolus administration of
intravenous contrast.
CONTRAST:  80mL OMNIPAQUE IOHEXOL 350 MG/ML SOLN

[Series 3: abd/ pelvis 5.0 i30f 2 · axial · 0.75mm/px · z∈[+991,+1436]mm · 10 of 101 slices shown, 12 images]
[im 6/101  soft-tissue]
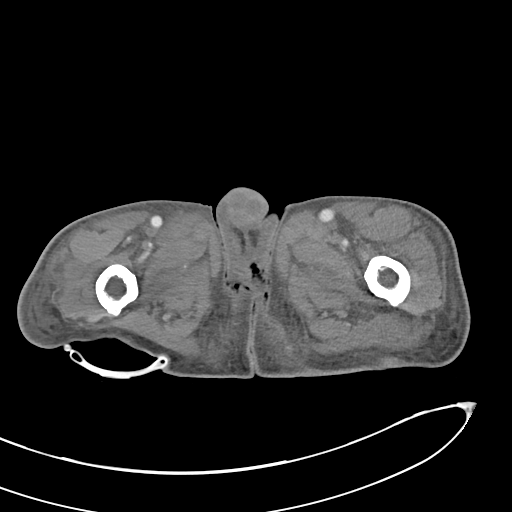
[im 6/101  bone]
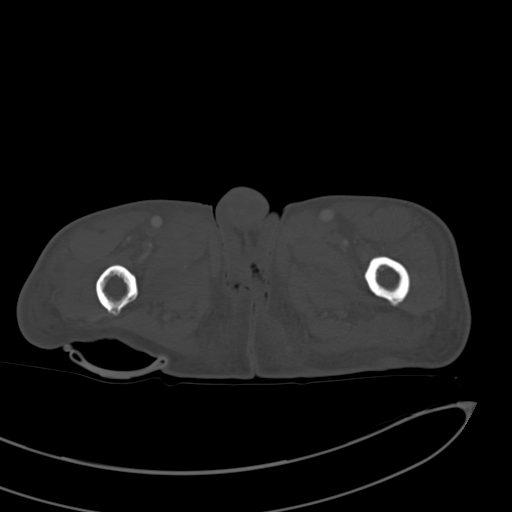
[im 16/101  soft-tissue]
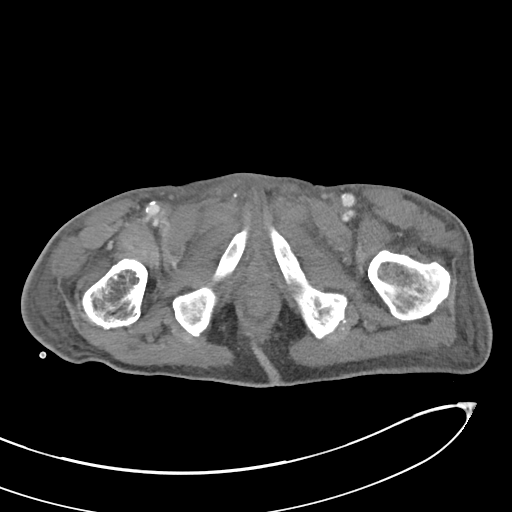
[im 27/101  soft-tissue]
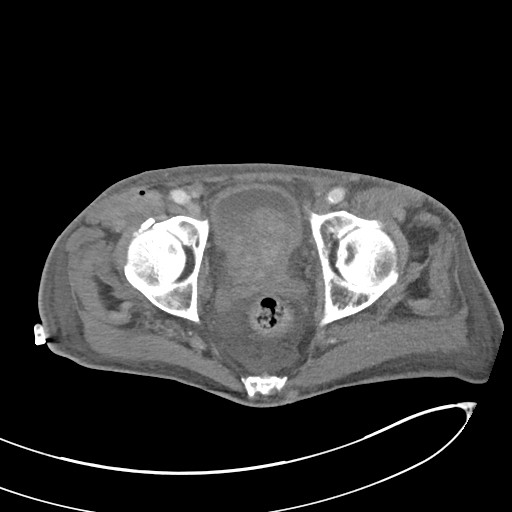
[im 37/101  soft-tissue]
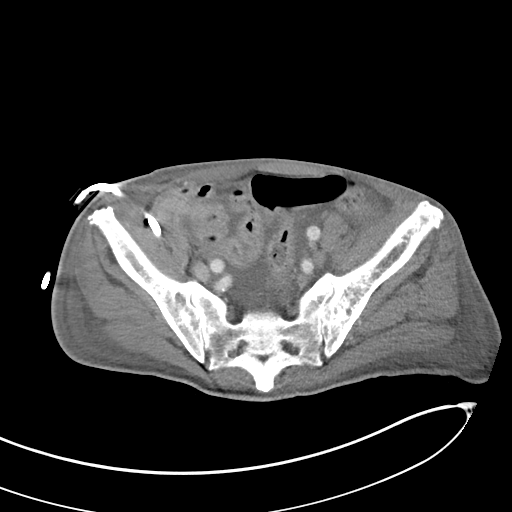
[im 48/101  soft-tissue]
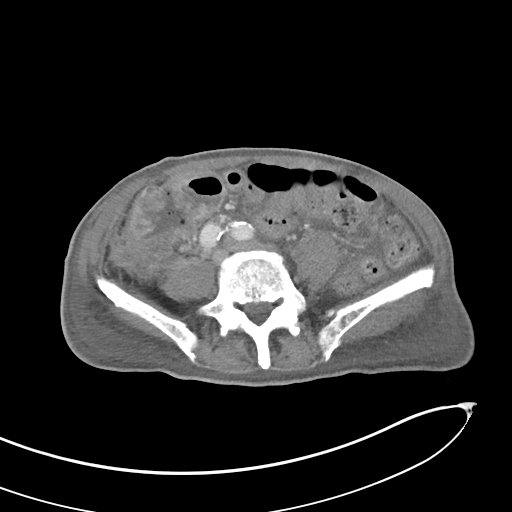
[im 53/101  soft-tissue]
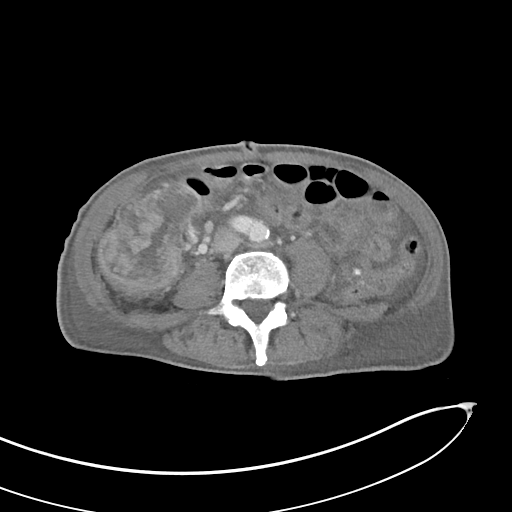
[im 64/101  soft-tissue]
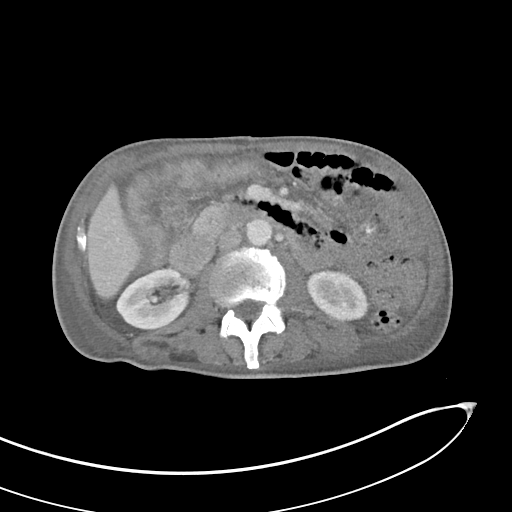
[im 74/101  soft-tissue]
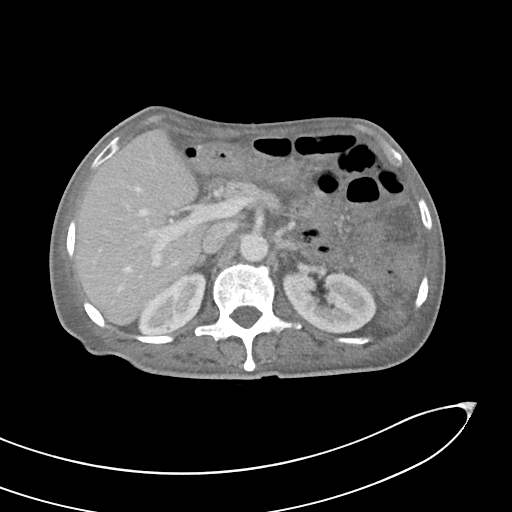
[im 85/101  soft-tissue]
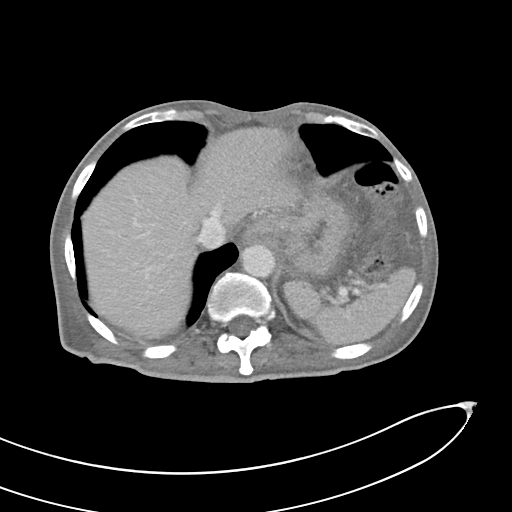
[im 85/101  bone]
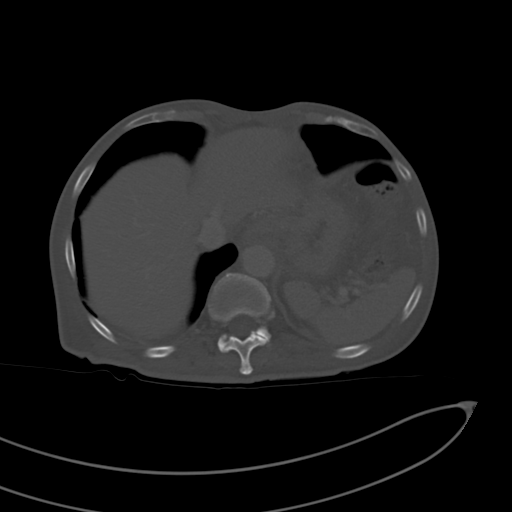
[im 95/101  soft-tissue]
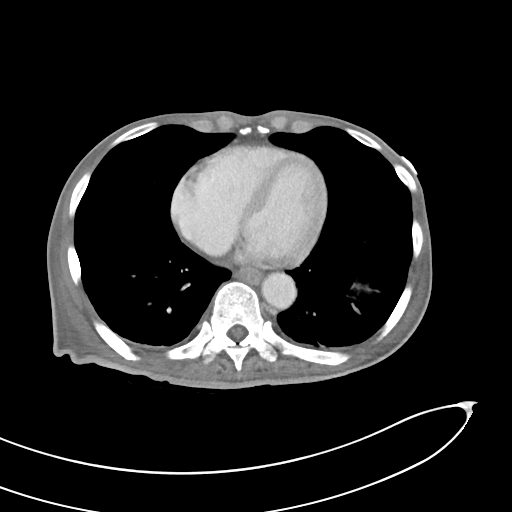

[Series 6: coronal soft tissue · coronal · 0.72mm/px · 3 of 85 slices shown]
[im 29/85  soft-tissue]
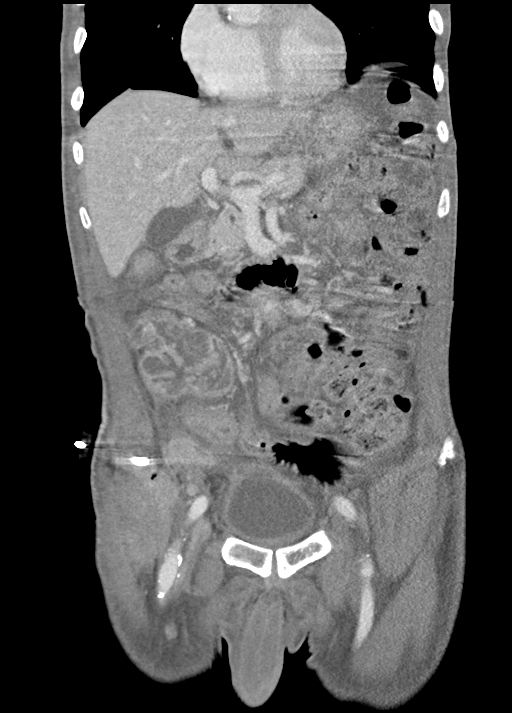
[im 38/85  soft-tissue]
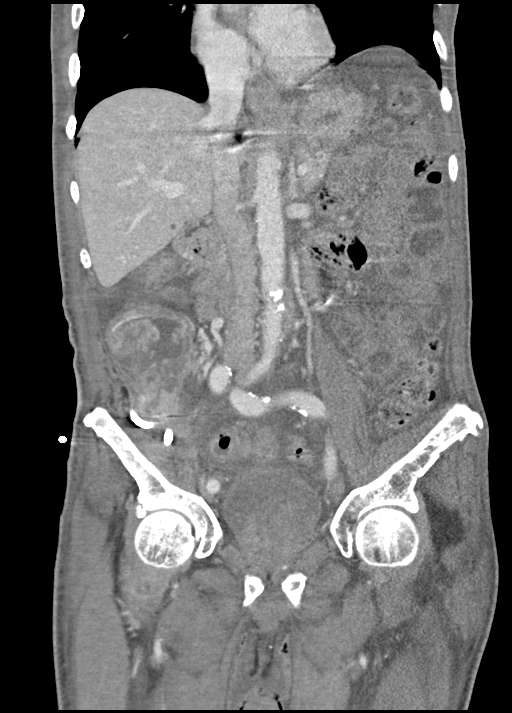
[im 47/85  soft-tissue]
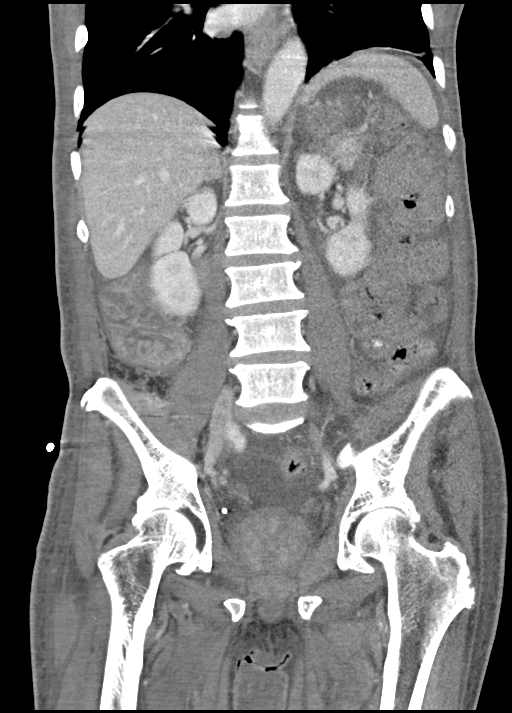

[13 of 46 positions shown; findings below may reference images not displayed]

[DATE]; [DATE];
[DATE]

CT-guided per drainage catheter placement-[DATE]; [DATE]

Drainage catheter injection and up sizing-[DATE]
FINDINGS: Lower chest: Limited visualization of the lower thorax demonstrates
minimal bibasilar subsegmental atelectasis/collapse, left greater
than right. No discrete focal airspace opacities. No pleural
effusion

Normal heart size. Coronary artery calcifications. Calcifications
involving the aortic valve leaflets. No pericardial effusion.

Hepatobiliary: Normal hepatic contour. Subcentimeter hypoattenuating
lesion within the dome of the left lobe of the liver (image 20,
series 3), as well as the tiny (approximately 4 mm) hypoattenuating
lesion within the caudal aspect of the right lobe of the liver
(image 33, series 3), are both too small to accurately characterize
though both unchanged compared to remote abdominal CT performed

No discrete worrisome hepatic lesions. Normal appearance of the
gallbladder given degree distention. No radiopaque gallstones. No
intra or extrahepatic biliary dilatation. No ascites.

Pancreas: Normal appearance of the pancreas.

Spleen: Normal appearance of the spleen.

Adrenals/Urinary Tract: There is symmetric enhancement and excretion
of the bilateral kidneys. Subcentimeter hypoattenuating renal
lesions are too small to adequately characterize though favored to
represent renal cysts. No definite evidence of nephrolithiasis on
this postcontrast examination. No urine obstruction or perinephric
stranding.

Normal appearance of the bilateral adrenal glands.

Mild diffuse thickening the urinary bladder wall, unchanged.

Stomach/Bowel: Redemonstrated masslike thickening involving the
cecum and proximal ascending colon. Moderate colonic stool burden
without evidence of enteric obstruction. No pneumoperitoneum,
pneumatosis or portal venous gas.

Stable positioning of right lower abdominal quadrant percutaneous
drainage catheter with near complete resolution of the right iliac
CIS and iliopsoas intramuscular abscess with tiny component
extending about the anterior aspect of the proximal femur measuring
approximately 2.1 x 2.3 cm (image 83, series 3), though now appears
predominantly phlegmonous.

There is a small amount of free fluid within the pelvic cul-de-sac
without peripheral wall enhancement to suggest definable/drainable
fluid collection.

Vascular/Lymphatic: Moderate amount of atherosclerotic plaque within
a normal caliber abdominal aorta, not resulting in hemodynamically
significant stenosis. The major branch vessels of the abdominal
aorta appear patent on this non CTA examination.

No definitive bulky retroperitoneal, mesenteric, pelvic or inguinal
lymphadenopathy.

Reproductive: Prostatomegaly with mass effect on the undersurface of
the urinary bladder.

Other: Mild diffuse body wall anasarca

Musculoskeletal: No acute or aggressive osseous abnormalities. Mild
multilevel lumbar spine DDD, worse at L2-L3 and L3-L4 with disc
space height loss, endplate irregularity and sclerosis.
Mild-to-moderate degenerative change the bilateral hips, right
greater than left. Note is made of a right-sided os acetabuli.
IMPRESSION: 1. Interval reduction/near resolution of right lower
abdominal/pelvic iliacus and iliopsoas musculature abscess with tiny
component remaining about the proximal anterior aspect of the right
femur measuring approximately 3.1 cm in diameter though now appears
predominantly phlegmonous.
2. Small amount of free fluid the pelvic cul-de-sac without new
definable/drainable fluid collection within the abdomen or pelvis.
3. Masslike thickening involving the cecum and ascending colon,
nonspecific though worrisome for malignancy. No evidence of enteric
obstruction. Further evaluation with colonoscopy after the
resolution of acute symptoms is advised.
4. Marked prostatomegaly with mass effect on the undersurface of the
urinary bladder. If not recently performed, further evaluation with
TIGER is advised.
5.  Aortic Atherosclerosis ([VH]-[VH]).

PLAN:
- Recommend maintaining the drainage catheter to a JP bulb,
continued b.i.d. flushing with 10 cc normal saline and diligent
records regarding daily drainage catheter output

- Assuming the patient's interval discharge, the patient may
follow-up at the [REDACTED] next week
(week [DATE]) for IV only contrast CT of the abdomen and
pelvis, evaluation and management and drainage catheter injection.

## 2021-06-22 MED ORDER — IOHEXOL 300 MG/ML  SOLN
100.0000 mL | Freq: Once | INTRAMUSCULAR | Status: AC | PRN
  Administered 2021-06-22: 15 mL

## 2021-06-22 MED ORDER — IOHEXOL 350 MG/ML SOLN
80.0000 mL | Freq: Once | INTRAVENOUS | Status: AC | PRN
  Administered 2021-06-22: 80 mL via INTRAVENOUS

## 2021-06-22 MED ORDER — LIDOCAINE HCL 1 % IJ SOLN
INTRAMUSCULAR | Status: DC | PRN
  Administered 2021-06-22: 20 mL

## 2021-06-22 MED ORDER — LIDOCAINE HCL 1 % IJ SOLN
INTRAMUSCULAR | Status: AC
  Filled 2021-06-22: qty 20

## 2021-06-22 MED ORDER — AMOXICILLIN-POT CLAVULANATE 875-125 MG PO TABS
1.0000 | ORAL_TABLET | Freq: Two times a day (BID) | ORAL | Status: DC
  Administered 2021-06-22 – 2021-06-30 (×17): 1 via ORAL
  Filled 2021-06-22 (×17): qty 1

## 2021-06-22 MED ORDER — POTASSIUM CHLORIDE 20 MEQ PO PACK
40.0000 meq | PACK | Freq: Once | ORAL | Status: AC
  Administered 2021-06-22: 40 meq via ORAL
  Filled 2021-06-22: qty 2

## 2021-06-22 MED ORDER — SODIUM CHLORIDE 0.9% FLUSH
5.0000 mL | Freq: Three times a day (TID) | INTRAVENOUS | Status: DC
  Administered 2021-06-23 – 2021-06-28 (×15): 5 mL

## 2021-06-22 NOTE — Progress Notes (Signed)
[place] [id number] [facility]) Hospital Liaison note:  This is a pending outpatient-based Palliative Care patient.   Will continue to follow for disposition.  Please call with any outpatient palliative questions or concerns.  Thank you, Lorelee Market, LPN [facility] : [facility] Liaison [phone]

## 2021-06-22 NOTE — Progress Notes (Signed)
Mobility Specialist Progress Note:   06/22/21 1410  Mobility  Activity Transferred:  Bed to chair  Level of Assistance Minimal assist, patient does 75% or more  Assistive Device Front wheel walker  Distance Ambulated (ft) 3 ft  Mobility Sit up in bed/chair position for meals  Mobility Response Tolerated well  Mobility performed by Mobility specialist  Bed Position Chair  $Mobility charge 1 Mobility   Pt asx during ambulation. Sitting up in chair.  Nelta Numbers Mobility Specialist  Phone [phone]

## 2021-06-22 NOTE — Care Management Important Message (Signed)
Important Message  Patient Details  Name: Albert Love MRN: [id number] Date of Birth: [birth year]   Medicare Important Message Given:  Yes     Teofil Maniaci Montine Circle 06/22/2021, 2:35 PM

## 2021-06-22 NOTE — Procedures (Signed)
Interventional Radiology Procedure:   Indications: Colonic mass with right retroperitoneal abscess/necrotic tumor.  Large of purulent fluid leaking around existing drain.  Procedure: Drain injection with drain upsize  Findings: Large abscess in RLQ.  New 14 Fr drain placed and 100 ml of yellow purulent fluid was removed from abscess.    Complications: No immediate complications noted.     EBL: Minimal  Plan: Continue with drainage. Plan for follow up CT pelvis with IV contrast to ensure that entire collection is now decompressed   Larraine Argo R. Anselm Pancoast, MD  Pager: [phone]

## 2021-06-22 NOTE — Progress Notes (Signed)
Family Medicine Teaching Service Daily Progress Note Intern Pager: [phone]  Patient name: Albert Love record number: [id number] Date of birth: 1941/08/06 Age: 80 y.o. Gender: male  Primary Care Provider: Gladys Damme, MD Consultants: Palliative care, surgery signed off, IR Code Status: DNR  Pt Overview and Major Events to Date:  11/5 admitted for sepsis with intra-abdominal infection  Assessment and Plan:  Albert Love is a 80 year old male presented with sepsis and intra-abdominal source secondary to cecal tumor.  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  Blood pressures normotensive most recently.  No fevers overnight.  White blood cell count 14.6 from 14.3.  Procalcitonin 0.45.  Blood culture no growth in 2 days.  Hiccups today improved with baclofen. Bowel movements last documented 11/6 type II, no abdominal pain.  -IR to exchanged and upsized drain, appreciate assistance  -Zosyn antibiotic switch to augmentin 875 mg 12 additional days -MIVF normal saline -Baclofen 5 mg for hiccups -Tylenol 650 mg every 6 hours for fever -Fentanyl for severe pain -Monitor bowel movements -AM CBC and BMP  Hypokalemia 3.5 today from 3.3. -40 meq packet -Monitor on BMP  Goals of care Continuing IV fluids and antibiotics.  Follows outpatient with palliative. -Palliative care consulted, appreciate assitance likely going to SNF as hospice does not except patient's on antibiotics  Macrocytic anemia and thrombocytopenia Likely secondary to GI  disease, hemoglobin today is 8.1. -Transfusion threshold hemoglobin less than 7  Cachexia with hypoalbuminemia and protein calorie malnutrition -RD consulted  Osteopenia -PT following  Enlarged prostate Urine output 1.0 ml/kg/hour, appropriate.  Only 1 bladder scan done 65 mL.  -Monitor bladder scans  FEN/GI: NPO for IR procedure today PPx: SCDs Dispo:SNF pending clinical improvement .   Subjective:  Only complains of wanting denture adhesive.  Objective: Temp:  [98.6 F (37 C)-99 F (37.2 C)] 98.6 F (37 C) (11/08 0450) Pulse Rate:  [90-103] 90 (11/08 0450) Resp:  [17-18] 18 (11/08 0450) BP: (119-135)/(56-73) 135/73 (11/08 0450) SpO2:  [97 %-100 %] 100 % (11/08 0450) Physical Exam: General: NAD, laying in bed, no hiccups Cardiovascular: RRR no m/r/g Respiratory: CTAB no w/r/c Abdomen: Nontender to palpation, soft, drain in place on right side, green purulent fluid, foul smelling, bandage in place Extremities: Moves freely, no LE edema  Laboratory: Recent Labs  Lab 06/20/21 0638 06/21/21 0539 06/22/21 0301  WBC 10.8* 14.3* 14.6*  HGB 8.4* 8.0* 8.1*  HCT 27.5* 24.6* 25.5*  PLT 496* 502* 476*   Recent Labs  Lab 06/19/21 1758 06/20/21 0638 06/21/21 0539 06/22/21 0301  NA 132* 131* 131* 134*  K 4.2 3.9 3.3* 3.5  CL 97* 98 98 101  CO2 25 23 25 25   BUN 6* 8 8 6*  CREATININE 0.66 0.55* 0.63 0.63  CALCIUM 8.3* 7.7* 7.7* 7.8*  PROT 6.0*  --   --   --   BILITOT 1.1  --   --   --   ALKPHOS 69  --   --   --   ALT 21  --   --   --   AST 21  --   --   --  GLUCOSE 98 85 105* 99    Imaging/Diagnostic Tests:   [name], MD 06/22/2021, 7:28 AM PGY-1, [place] Intern pager: [phone], text pages welcome

## 2021-06-22 NOTE — Progress Notes (Signed)
FPTS Brief Progress Note  S:Patient resting comfortably in bed. Nursing had no complaints, but noted that he has not been consented for his procedure tomorrow.    O: BP 128/73 (BP Location: Left Arm)   Pulse (!) 103   Temp 99 F (37.2 C) (Oral)   Resp 18   Ht 5\' 11"  (1.803 m)   Wt 61.7 kg   SpO2 99%   BMI 18.97 kg/m     A/P: Sepsis due to intra-abdominal infection  cecal mass  Plans for IR procedure in morning, to change drain. Continue plans per day team.  - Orders reviewed. Labs for AM ordered, which was adjusted as needed.    Holley Bouche, MD 06/22/2021, 3:01 AM PGY-1, [facility] Night Resident  Please page [phone] with questions.

## 2021-06-22 NOTE — TOC Progression Note (Signed)
Transition of Care [facility]) - Progression Note    Patient Details  Name: Albert Love MRN: [id number] Date of Birth: [birth year]  Transition of [facility] CM/SW Contact  Joanne Chars, LCSW Phone Number: 06/22/2021, 3:04 PM  Clinical Narrative:   CSW spoke with pt regarding bed offers.  Pt asked CSW to speak with nephew Albert Love.  Attempted to call nephew, no answer, LM.          Expected Discharge Plan and Services                                                 Social Determinants of Health (SDOH) Interventions    Readmission Risk Interventions No flowsheet data found.

## 2021-06-22 NOTE — Progress Notes (Signed)
Daily Progress Note   Patient Name: Albert Love       Date: 06/22/2021 DOB: [birth year]  Age: 80 y.o. MRN#: [id number] Attending Physician: Kinnie Feil, MD Primary Care Physician: Gladys Damme, MD Admit Date: 06/19/2021  Reason for Consultation/Follow-up: Establishing goals of care  Subjective: Frustrated to not have denture adhesive - unable to eat No other complaints  Length of Stay: 3  Current Medications: Scheduled Meds:   baclofen  5 mg Oral TID   feeding supplement  237 mL Oral BID BM   lidocaine       multivitamin with minerals  1 tablet Oral Daily   sodium chloride flush  5 mL Intracatheter Q8H   sodium chloride flush  5 mL Intracatheter Q8H    Continuous Infusions:  sodium chloride 100 mL/hr at 06/21/21 0700   piperacillin-tazobactam (ZOSYN)  IV 3.375 g (06/22/21 0631)    PRN Meds: acetaminophen, fentaNYL (SUBLIMAZE) injection, lidocaine, oxyCODONE, promethazine  Physical Exam Constitutional:      General: Albert Love is not in acute distress.    Comments: Appears thin, frail   Pulmonary:     Effort: Pulmonary effort is normal.  Skin:    General: Skin is warm and dry.  Neurological:     Mental Status: Albert Love is alert and oriented to person, place, and time.            Vital Signs: BP 115/68 (BP Location: Right Arm)   Pulse 83   Temp 98.9 F (37.2 C) (Oral)   Resp 18   Ht 5\' 11"  (1.803 m)   Wt 61.7 kg   SpO2 100%   BMI 18.97 kg/m  SpO2: SpO2: 100 % O2 Device: O2 Device: Room Air O2 Flow Rate:    Intake/output summary:  Intake/Output Summary (Last 24 hours) at 06/22/2021 1103 Last data filed at 06/22/2021 0910 Gross per 24 hour  Intake 0 ml  Output 1580 ml  Net -1580 ml   LBM: Last BM Date: 06/20/21 Baseline Weight: Weight: 61.7 kg Most recent weight:  Weight: 61.7 kg       Palliative Assessment/Data:50%      Patient Active Problem List   Diagnosis Date Noted   Intra-abdominal abscess ([place]) 06/19/2021   Leukocytosis 06/19/2021   Sepsis ([place]) 06/19/2021   Hospice care patient 06/15/2021   Palliative care patient 06/15/2021   Constipation    Failure to thrive in adult    Abscess    Protein-calorie malnutrition, severe 06/07/2021   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  Pt had JP drain in place on arrival since 10/24 and still has drain.   Palliative care had been involved with Albert Love upon his last admission and have been asked to get re-involved to discuss goals of care. Per prior hospital discussions the plan was to be physically optimized at skilled nursing and then to transition to hospice care. This is complicated though as his nephew, Albert Love is unable to offer the 24/7 support Albert Love would need for hospice care.   Assessment: This morning Albert. Love is most frustrated that Albert Love does not  have any denture adhesive - attempted to find some for him however unable to locate - discussed with staff and secretary who are ordering some and will deliver to his room. Albert Love was satisfied with this plan and plans to order a meal following delivery of his denture adhesive - Albert Love feels quite hungry.   We review his plan of care - Albert Love to express his desire to continue current measures with antibiotics. Albert Love tells me Albert Love does not want to go to rehab. Albert Love asks about hospice facility - we discuss that hospice facility is not in line with his goals of care as Albert Love would not continue antibiotics there - Albert Love agrees this is not what Albert Love wants. We discuss possibility of a different rehab facility than the one Albert Love was last at - Albert Love would like to pursue this option.   Albert Love that his ultimate goal is to return to his apartment and be able to care for himself. Does not appear this is a realistic goal.  I ask him to consider how to approach his care if/when infection reoccurs. Albert Love tells me Albert Love does not want repeated hospitalizations (this is reflected in previously completed MOST form). We discuss that hospice would be appropriate at this time. Albert Love agrees with this.   Recommendations/Plan: DNR Continue treatment with IV antibiotics and fluids Plan for dc to rehab though Albert Love refuses to return to his previous SNF If/when infection reoccurs Albert Love would like to utilize his hospice benefit at that time - Albert Love is not interested in repeat hospitalizations Albert Love to be able to return home to his apartment but unfortunately there is not a caregiver able to meet Albert Love even with extra support of hospice Outpatient palliative to follow at SNF  Goals of Care and Additional Recommendations: Limitations on Scope of Treatment: Avoid Hospitalization and No Artificial Feeding  Code Status: DNR  Prognosis:  Poor prognosis r/t unresectable cecal mass w/ repeated intra abdominal  infection  Discharge Planning: [place] for rehab with Palliative care service follow-up  Care plan was discussed with patient  Thank you for allowing the Palliative Medicine Team to assist in the care of this patient.   Total Time 25 minutes Prolonged Time Billed  no       Greater than 50%  of this time was spent counseling and coordinating care related to the above assessment and plan.  Juel Burrow, DNP, [facility] Palliative Medicine Team Team Phone # [phone]  Pager [phone]

## 2021-06-23 DIAGNOSIS — I10 Essential (primary) hypertension: Secondary | ICD-10-CM | POA: Diagnosis not present

## 2021-06-23 DIAGNOSIS — K651 Peritoneal abscess: Secondary | ICD-10-CM | POA: Diagnosis not present

## 2021-06-23 DIAGNOSIS — K6389 Other specified diseases of intestine: Secondary | ICD-10-CM | POA: Diagnosis not present

## 2021-06-23 DIAGNOSIS — R627 Adult failure to thrive: Secondary | ICD-10-CM | POA: Diagnosis not present

## 2021-06-23 LAB — BASIC METABOLIC PANEL
Anion gap: 8 (ref 5–15)
BUN: 7 mg/dL — ABNORMAL LOW (ref 8–23)
CO2: 24 mmol/L (ref 22–32)
Calcium: 7.7 mg/dL — ABNORMAL LOW (ref 8.9–10.3)
Chloride: 101 mmol/L (ref 98–111)
Creatinine, Ser: 0.6 mg/dL — ABNORMAL LOW (ref 0.61–1.24)
GFR, Estimated: >60 mL/min (ref >60)
Glucose, Bld: 88 mg/dL (ref 70–99)
Potassium: 3.7 mmol/L (ref 3.5–5.1)
Sodium: 133 mmol/L — ABNORMAL LOW (ref 135–145)

## 2021-06-23 LAB — CBC
HCT: 26.7 % — ABNORMAL LOW (ref 39.0–52.0)
Hemoglobin: 8.3 g/dL — ABNORMAL LOW (ref 13.0–17.0)
MCH: 20.5 pg — ABNORMAL LOW (ref 26.0–34.0)
MCHC: 31.1 g/dL (ref 30.0–36.0)
MCV: 66.1 fL — ABNORMAL LOW (ref 80.0–100.0)
Platelets: 392 10*3/uL (ref 150–400)
RBC: 4.04 MIL/uL — ABNORMAL LOW (ref 4.22–5.81)
RDW: 18.6 % — ABNORMAL HIGH (ref 11.5–15.5)
WBC: 9 10*3/uL (ref 4.0–10.5)
nRBC: 0 % (ref 0.0–0.2)

## 2021-06-23 MED ORDER — MELATONIN 5 MG PO TABS
5.0000 mg | ORAL_TABLET | Freq: Every day | ORAL | Status: DC
  Administered 2021-06-23 – 2021-06-29 (×7): 5 mg via ORAL
  Filled 2021-06-23 (×7): qty 1

## 2021-06-23 NOTE — Progress Notes (Signed)
Mobility Specialist Progress Note:   06/23/21 1400  Mobility  Activity Ambulated in room  Level of Assistance Minimal assist, patient does 75% or more  Assistive Device Front wheel walker  Distance Ambulated (ft) 20 ft  Mobility Ambulated with assistance in room  Mobility Response Tolerated well  Mobility performed by Mobility specialist  $Mobility charge 1 Mobility   Pt c/o lack of energy/ generalized weakness. Gait more stable today, pt encouraged.   Nelta Numbers Mobility Specialist  Phone [phone]

## 2021-06-23 NOTE — Progress Notes (Signed)
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  Pt had JP drain in place on arrival since 10/24 and still has drain.  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    PT Comments    Pt admitted with above diagnosis. Pt was able to ambulate with RW with good stability overall.  Pt with flexed trunk and encouraged up right stance which pt states is difficult due to pain. Pt progressing as he did incr distance with ambulation today.  Pt currently with functional limitations due to balance and endurance deficits. Pt will benefit from skilled PT to increase their independence and safety with mobility to allow discharge to the venue listed below.      Recommendations for follow up therapy are one component of a multi-disciplinary discharge planning process, led by the attending physician.  Recommendations may be updated based on patient status, additional functional criteria and insurance authorization.  Follow Up Recommendations  Skilled nursing-short term rehab (<3 hours/day)     Assistance Recommended at Discharge Frequent or constant Supervision/Assistance  Equipment Recommendations  Rolling walker (2 wheels);Hospital bed    Recommendations for Other Services       Precautions / Restrictions Precautions Precautions: Fall Precaution Comments: JP drain right Restrictions Weight Bearing Restrictions: No     Mobility  Bed Mobility Overal bed  mobility: Needs Assistance Bed Mobility: Supine to Sit Rolling: Min assist Sidelying to sit: Min assist Supine to sit: Min assist   Sit to sidelying: Min assist General bed mobility comments: verbal cues for technique and asssit for trunk    Transfers Overall transfer level: Needs assistance Equipment used: Rolling walker (2 wheels) Transfers: Sit to/from Stand;Bed to chair/wheelchair/BSC Sit to Stand: Min assist           General transfer comment: min assist to power up with cues for hand placement    Ambulation/Gait Ambulation/Gait assistance: Min guard;Min assist Gait Distance (Feet): 35 Feet Assistive device: Rolling walker (2 wheels) Gait Pattern/deviations: Step-through pattern;Decreased stride length;Trunk flexed   Gait velocity interpretation: <1.31 ft/sec, indicative of household ambulator   General Gait Details: Pt able to walk to door and back to chair today. Slow but steady gait with RW.  Cued pt to stand tall as he bends trunk forward sligtly whichhe states is unusual for him.   Stairs             Wheelchair Mobility    Modified Rankin (Stroke Patients Only)       Balance Overall balance assessment: Needs assistance Sitting-balance support: No upper extremity supported;Feet supported Sitting balance-Leahy Scale: Fair Sitting balance - Comments: able to perform reaching tasks from eob   Standing balance support: Bilateral upper extremity supported;During functional activity Standing balance-Leahy Scale: Poor Standing balance comment: Needs UE and external support                            Cognition Arousal/Alertness: Awake/alert Behavior During Therapy: WFL for tasks assessed/performed Overall Cognitive Status: Within Functional Limits for tasks assessed  General Comments: patient able to follow commands and oriented        Exercises General Exercises - Lower Extremity Ankle  Circles/Pumps: AROM;Both;10 reps;Seated Long Arc Quad: AROM;Both;10 reps;Seated Hip Flexion/Marching: AROM;Left;10 reps;Seated    General Comments        Pertinent Vitals/Pain Pain Assessment: No/denies pain    Home Living                          Prior Function            PT Goals (current goals can now be found in the care plan section) Acute Rehab PT Goals Patient Stated Goal: wants to be able to walk to the bathroom Progress towards PT goals: Progressing toward goals    Frequency    Min 2X/week      PT Plan Current plan remains appropriate    Co-evaluation              AM-PAC PT "6 Clicks" Mobility   Outcome Measure  Help needed turning from your back to your side while in a flat bed without using bedrails?: A Little Help needed moving from lying on your back to sitting on the side of a flat bed without using bedrails?: A Little Help needed moving to and from a bed to a chair (including a wheelchair)?: A Lot Help needed standing up from a chair using your arms (e.g., wheelchair or bedside chair)?: A Lot Help needed to walk in hospital room?: A Little Help needed climbing 3-5 steps with a railing? : Total 6 Click Score: 14    End of Session Equipment Utilized During Treatment: Gait belt Activity Tolerance: Patient limited by fatigue Patient left: in chair;with call bell/phone within reach;with chair alarm set Nurse Communication: Mobility status PT Visit Diagnosis: Muscle weakness (generalized) (M62.81);Difficulty in walking, not elsewhere classified (R26.2)     Time: 6629-4765 PT Time Calculation (min) (ACUTE ONLY): 23 min  Charges:  $Gait Training: 8-22 mins $Therapeutic Exercise: 8-22 mins                     [name] M,PT Acute Rehab Services [phone] (pager)    [name] 06/23/2021, 11:37 AM

## 2021-06-23 NOTE — Progress Notes (Signed)
Referring Physician(s): Janne Napoleon NP  Supervising Physician: Ruthann Cancer  Patient Status:  [facility] - In-pt  Chief Complaint:  RLQ abscess s/p RLQ abscess drain placement on 10.24.22. by Dr. Dwaine Gale. Exchanged and up-sized on 11.8.22 by Dr. Anselm Pancoast  Subjective:  Patient laying bed states that he is feeling well. Expressing some concern regarding his quality of life after discharge.  Requesting that if he was to have a colonoscopy he would prefer it to be while he is inpatient.   Allergies: Patient has no known allergies.  Medications: Prior to Admission medications   Medication Sig Start Date End Date Taking? Authorizing Provider  aspirin 81 MG chewable tablet Chew 1 tablet (81 mg total) by mouth daily. 01/21/21  Yes Gladys Damme, MD  atorvastatin (LIPITOR) 80 MG tablet Take 80 mg by mouth daily.   Yes [provider]  famotidine (PEPCID) 10 MG tablet Take 1 tablet (10 mg total) by mouth daily. 06/16/21  Yes Sowell, Erlene Quan, MD  feeding supplement (ENSURE ENLIVE / ENSURE PLUS) LIQD Take 237 mLs by mouth 3 (three) times daily between meals. 06/15/21  Yes Holley Bouche, MD  ferrous sulfate 325 (65 FE) MG EC tablet Take 1 tablet (325 mg total) by mouth daily with breakfast. 05/12/21  Yes Gatha Mayer, MD  metoclopramide (REGLAN) 10 MG tablet Take 1 tablet (10 mg total) by mouth 3 (three) times daily before meals. 06/15/21  Yes Sowell, Erlene Quan, MD  Multiple Vitamin (MULTIVITAMIN WITH MINERALS) TABS tablet Take 1 tablet by mouth daily. 06/16/21  Yes Sowell, Erlene Quan, MD  oxyCODONE 10 MG TABS Take 1 tablet (10 mg total) by mouth every 6 (six) hours. 06/15/21  Yes Holley Bouche, MD     Vital Signs: BP (!) 119/91 (BP Location: Right Arm)   Pulse 77   Temp 98.1 F (36.7 C) (Oral)   Resp 16   Ht 5\' 11"  (1.803 m)   Wt 136 lb 0.4 oz (61.7 kg)   SpO2 100%   BMI 18.97 kg/m   Physical Exam Vitals and nursing note reviewed.  Constitutional:      Appearance: He is  well-developed.  HENT:     Head: Normocephalic.  Pulmonary:     Effort: Pulmonary effort is normal.  Abdominal:     Comments: Positive RLQ drain to suction. Site is unremarkable. Dressing is clean dry and intact. 50 ml of  dark red colored fluid noted in bulb suction device. Drain is able to be flushed easily.    Musculoskeletal:        General: Normal range of motion.     Cervical back: Normal range of motion.  Skin:    General: Skin is dry.  Neurological:     Mental Status: He is alert and oriented to person, place, and time.    Imaging: CT ABDOMEN PELVIS W CONTRAST  Result Date: 06/23/2021 CLINICAL DATA:  Pericecal/retroperitoneal abscess, post percutaneous drainage catheter placement on 06/07/2021 EXAM: CT ABDOMEN AND PELVIS WITH CONTRAST TECHNIQUE: Multidetector CT imaging of the abdomen and pelvis was performed using the standard protocol following bolus administration of intravenous contrast. CONTRAST:  30mL OMNIPAQUE IOHEXOL 350 MG/ML SOLN COMPARISON:  CT abdomen pelvis-06/19/2021; 06/06/2021; 09/13/2019; 09/05/2019 CT-guided per drainage catheter placement-06/07/2021; 09/14/2019 Drainage catheter injection and up sizing-08/22/2020 FINDINGS: Lower chest: Limited visualization of the lower thorax demonstrates minimal bibasilar subsegmental atelectasis/collapse, left greater than right. No discrete focal airspace opacities. No pleural effusion Normal heart size. Coronary artery calcifications. Calcifications involving the aortic valve leaflets. No  pericardial effusion. Hepatobiliary: Normal hepatic contour. Subcentimeter hypoattenuating lesion within the dome of the left lobe of the liver (image 20, series 3), as well as the tiny (approximately 4 mm) hypoattenuating lesion within the caudal aspect of the right lobe of the liver (image 33, series 3), are both too small to accurately characterize though both unchanged compared to remote abdominal CT performed 08/2019. No discrete worrisome  hepatic lesions. Normal appearance of the gallbladder given degree distention. No radiopaque gallstones. No intra or extrahepatic biliary dilatation. No ascites. Pancreas: Normal appearance of the pancreas. Spleen: Normal appearance of the spleen. Adrenals/Urinary Tract: There is symmetric enhancement and excretion of the bilateral kidneys. Subcentimeter hypoattenuating renal lesions are too small to adequately characterize though favored to represent renal cysts. No definite evidence of nephrolithiasis on this postcontrast examination. No urine obstruction or perinephric stranding. Normal appearance of the bilateral adrenal glands. Mild diffuse thickening the urinary bladder wall, unchanged. Stomach/Bowel: Redemonstrated masslike thickening involving the cecum and proximal ascending colon. Moderate colonic stool burden without evidence of enteric obstruction. No pneumoperitoneum, pneumatosis or portal venous gas. Stable positioning of right lower abdominal quadrant percutaneous drainage catheter with near complete resolution of the right iliac CIS and iliopsoas intramuscular abscess with tiny component extending about the anterior aspect of the proximal femur measuring approximately 2.1 x 2.3 cm (image 83, series 3), though now appears predominantly phlegmonous. There is a small amount of free fluid within the pelvic cul-de-sac without peripheral wall enhancement to suggest definable/drainable fluid collection. Vascular/Lymphatic: Moderate amount of atherosclerotic plaque within a normal caliber abdominal aorta, not resulting in hemodynamically significant stenosis. The major branch vessels of the abdominal aorta appear patent on this non CTA examination. No definitive bulky retroperitoneal, mesenteric, pelvic or inguinal lymphadenopathy. Reproductive: Prostatomegaly with mass effect on the undersurface of the urinary bladder. Other: Mild diffuse body wall anasarca Musculoskeletal: No acute or aggressive osseous  abnormalities. Mild multilevel lumbar spine DDD, worse at L2-L3 and L3-L4 with disc space height loss, endplate irregularity and sclerosis. Mild-to-moderate degenerative change the bilateral hips, right greater than left. Note is made of a right-sided os acetabuli. IMPRESSION: 1. Interval reduction/near resolution of right lower abdominal/pelvic iliacus and iliopsoas musculature abscess with tiny component remaining about the proximal anterior aspect of the right femur measuring approximately 3.1 cm in diameter though now appears predominantly phlegmonous. 2. Small amount of free fluid the pelvic cul-de-sac without new definable/drainable fluid collection within the abdomen or pelvis. 3. Masslike thickening involving the cecum and ascending colon, nonspecific though worrisome for malignancy. No evidence of enteric obstruction. Further evaluation with colonoscopy after the resolution of acute symptoms is advised. 4. Marked prostatomegaly with mass effect on the undersurface of the urinary bladder. If not recently performed, further evaluation with DRE is advised. 5.  Aortic Atherosclerosis (ICD10-I70.0). PLAN: - Recommend maintaining the drainage catheter to a JP bulb, continued b.i.d. flushing with 10 cc normal saline and diligent records regarding daily drainage catheter output - Assuming the patient's interval discharge, the patient may follow-up at the interventional radiology drain clinic next week (week of November 14) for IV only contrast CT of the abdomen and pelvis, evaluation and management and drainage catheter injection. Electronically Signed   By: Sandi Mariscal M.D.   On: 06/23/2021 08:43   CT ABDOMEN PELVIS W CONTRAST  Result Date: 06/19/2021 CLINICAL DATA:  Abdominal pain and fevers, known right iliacus abscess for, status post percutaneous drainage. EXAM: CT ABDOMEN AND PELVIS WITH CONTRAST TECHNIQUE: Multidetector CT imaging of the abdomen  and pelvis was performed using the standard protocol  following bolus administration of intravenous contrast. CONTRAST:  156mL OMNIPAQUE IOHEXOL 300 MG/ML  SOLN COMPARISON:  06/06/2021 FINDINGS: Lower chest: No acute abnormality. Hepatobiliary: Liver demonstrates fatty infiltration. A mildly lobulated cyst is noted in the left lobe of the liver stable from prior exam. The gallbladder is well distended. Pancreas: Unremarkable. No pancreatic ductal dilatation or surrounding inflammatory changes. Spleen: Normal in size without focal abnormality. Adrenals/Urinary Tract: Adrenal glands are within normal limits. Kidneys demonstrate a normal enhancement pattern bilaterally. No renal calculi or obstructive changes are seen. Normal excretion of contrast is noted. The bladder is partially distended. Stomach/Bowel: Fecal material is noted throughout the colon. No obstructive changes are seen. There is again noted a diffuse area of wall thickening involving the cecum and proximal ascending colon again suspicious for malignancy. This appears to be contiguous with the known right lower quadrant abscess involving the iliacus and psoas muscles extending along the anterior aspect of the right hip joint. More air is noted within the collection in the right lower quadrant and a drainage catheter is noted within. The area of inter communication between this and the adjacent thickened cecum is best noted on the coronal image 33 of series 6. Small bowel and stomach appear within normal limits. Vascular/Lymphatic: Aortic atherosclerosis. No enlarged abdominal or pelvic lymph nodes. Reproductive: Prostate is again enlarged indenting upon the inferior aspect of the bladder similar to that seen on the prior exam. Other: There is a large air-fluid collection identified along the course of the right psoas and iliacus muscles. This measures approximately 8.6 x 5.8 cm in greatest dimension and is slightly smaller than that seen on the prior exam when it measured up to 9.4 cm. It extends for  approximately 8.6 cm in craniocaudad projection. Pigtail catheter is noted within the collection although the collection continues to extend along the anterior aspect of the right hip joint and proximal right femur as well as apparently communicating with the thickened cecum consistent with focal perforation. No free fluid is noted within the pelvis. Musculoskeletal: Degenerative changes of the lumbar spine are noted. No erosive changes are identified at this time. IMPRESSION: Persistent air-fluid collection identified in the right iliac fossa with percutaneous drainage catheter within. It is slightly smaller than that seen on the prior exam from 06/06/2021. It appears to communicate with the thickened and inflamed cecum as described best noted on the coronal images. Again the possibility of cecal malignancy deserves consideration given the appearance of the cecum and proximal ascending colon. Remainder of the exam is stable from the recent exam from 06/06/2021. Electronically Signed   By: Inez Catalina M.D.   On: 06/19/2021 20:31   IR Catheter Tube Change  Result Date: 06/22/2021 INDICATION: 80 year old with a right colonic mass and large retroperitoneal abscess with existing drain. Patient is having persistent fluid leakage around the drainage catheter. EXAM: 1. Drain injection 2. Drain exchange with up sizing MEDICATIONS: Local anesthetic, 1% lidocaine ANESTHESIA/SEDATION: None FLUOROSCOPY TIME:  48 seconds, 2 mGy CONTRAST:  15 mL Omnipaque 950 COMPLICATIONS: None immediate. PROCEDURE: Informed written consent was obtained from the patient after a thorough discussion of the procedural risks, benefits and alternatives. All questions were addressed. A timeout was performed prior to the initiation of the procedure. Patient was placed supine. The right lower abdomen and existing drain were prepped and draped in sterile fashion. Maximal barrier sterile technique was utilized including caps, mask, sterile gowns,  sterile gloves, sterile drape,  hand hygiene and skin antiseptic. Contrast was injected through the drain. The drain was cut and removed over a Bentson wire. A new 14 French pigtail drain was easily advanced over the wire and placed within the large abscess cavity. Approximately 100 mL of yellow purulent fluid was aspirated from the collection. Gas and bloody fluid was aspirated after all of the yellow fluid was removed. Skin around the catheter was anesthetized with 1% lidocaine and the catheter was sutured to skin with Prolene suture. FINDINGS: Large residual abscess cavity in the right lower abdomen corresponding with the previous CT findings. Approximately 100 mL of predominantly yellow fluid was removed along with a large amount of gas. 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 IMPRESSION: 1. Successful exchange and up sizing of the right lower abdominal abscess drain. The patient now has a 14 [place] drain. Electronically Signed   By: Markus Daft M.D.   On: 06/22/2021 09:41   DG Chest Portable 1 View  Result Date: 06/19/2021 CLINICAL DATA:  Possible sepsis. EXAM: PORTABLE CHEST 1 VIEW COMPARISON:  June 07, 2021 FINDINGS: Tortuosity of the aorta. Cardiomediastinal silhouette is normal. Mediastinal contours appear intact. There is no evidence of focal airspace consolidation, pleural effusion or pneumothorax. Osseous structures are without acute abnormality. Soft tissues are grossly normal. IMPRESSION: No active disease. Electronically Signed   By: Fidela Salisbury M.D.   On: 06/19/2021 19:12    Labs:  CBC: Recent Labs    06/20/21 0638 06/21/21 0539 06/22/21 0301 06/23/21 0136  WBC 10.8* 14.3* 14.6* 9.0  HGB 8.4* 8.0* 8.1* 8.3*  HCT 27.5* 24.6* 25.5* 26.7*  PLT 496* 502* 476* 392    COAGS: Recent Labs    10/19/20 1350 06/19/21 1758  INR 1.1 1.4*  APTT 38*  --     BMP: Recent Labs    06/20/21 0638 06/21/21 0539 06/22/21 0301  06/23/21 0136  NA 131* 131* 134* 133*  K 3.9 3.3* 3.5 3.7  CL 98 98 101 101  CO2 23 25 25 24   GLUCOSE 85 105* 99 88  BUN 8 8 6* 7*  CALCIUM 7.7* 7.7* 7.8* 7.7*  CREATININE 0.55* 0.63 0.63 0.60*  GFRNONAA >60 >60 >60 >60    LIVER FUNCTION TESTS: Recent Labs    06/12/21 0202 06/13/21 0134 06/14/21 0539 06/19/21 1758  BILITOT 0.6 0.4 0.6 1.1  AST 30 42* 39 21  ALT 38 38 40 21  ALKPHOS 79 77 73 69  PROT 5.8* 6.0* 6.1* 6.0*  ALBUMIN 1.9* 2.0* 2.2* 2.1*    Assessment and Plan:  80 y.o. male inpatient. History of HTN, anemia, HLD, inguinal  perforated gastric ulcer with pneumoperitoneum s/p [name] Patch.  Found to be septic secondary to cecal tumor with a retroperitoneal abscess. IR placed an RLQ abscess drain on 10.24.22. Drain was up sized to a 14 F r on 11.28.22 with 100 ml of yellow fluid aspirated. 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   Drain Location: RLQ Size: Fr size: 14 Fr Date of placement: 10.24.22 up sized on 11.8.22  Currently to: Drain collection device: suction bulb 24 hour output:  Output by Drain (mL) 06/21/21 0701 - 06/21/21 1900 06/21/21 1901 - 06/22/21 0700 06/22/21 0701 - 06/22/21 1900 06/22/21 1901 - 06/23/21 0700 06/23/21 0701 - 06/23/21 1434  Closed System Drain 1 Lateral;Right Abdomen Bulb (JP) 14 Fr.    50   50 ml of dark red fluid noted to be in the JP  drain.  Cultures from original abscess abundant strep constellatus  Interval imaging/drain manipulation:  Ct abd pelvis from 11.9.22 reads Interval reduction/near resolution of right lower abdominal/pelvic iliacus and iliopsoas musculature abscess with tiny component remaining about the proximal anterior aspect of the right femur measuring approximately 3.1 cm in diameter though now appears predominantly phlegmonous.  Current examination: Flushes/aspirates easily.  Insertion site unremarkable. Suture and stat lock in place. Dressed appropriately.   Plan: - Recommend maintaining the drainage  catheter to a JP bulb, continued b.i.d. flushing with 10 cc normal saline and diligent records regarding daily drainage catheter output Record output Q shift. Dressing changes QD or PRN if soiled.  Call IR APP or on call IR MD if difficulty flushing or sudden change in drain output.    Discharge Planning - Assuming the patient's interval discharge, the patient may follow-up at the interventional radiology drain clinic next week (week of November 14) for IV only contrast CT of the abdomen and pelvis, evaluation and management and drainage catheter injection IR scheduler will contact patient with date/time of appointment. Patient will need to flush drain QD with 5 cc NS, record output QD, dressing changes every 2-3 days or earlier if soiled.   IR will continue to follow - please call with questions or concerns.     Electronically Signed: Jacqualine Mau, NP 06/23/2021, 2:00 PM   I spent a total of 15 Minutes at the patient's bedside AND on the patient's hospital floor or unit, greater than 50% of which was counseling/coordinating care for RLQ abscess drain

## 2021-06-23 NOTE — Progress Notes (Signed)
Family Medicine Teaching Service Daily Progress Note Intern Pager: [phone]  Patient name: Albert Love record number: [id number] Date of birth: 29-Jun-1941 Age: 80 y.o. 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 Repeat drain placed yesterday with 100 cc of yellow purulent fluid removed from abscess. White blood cell count significantly decreased to 9 from 14.6.  Hiccups today much improved. Blood culture no growth to date. He is fine with getting colonoscopy in hospital but not outpatient. No pain on abdominal exam, drain in place draining serosanguinous fluid. -IR plan on CTA: - "Recommend maintaining the drainage catheter to a JP bulb, continued b.i.d. flushing with 10 cc normal saline and diligent records regarding daily drainage catheter output. May follow-up at the interventional radiology drain clinic next week (week of November 14) for IV only contrast CT of the abdomen and pelvis, evaluation and management and drainage catheter injection." -Augmentin 875 mg 11 more days -IVF 100 mL/hr d/c today  -Baclofen 5 mg for hiccups -Tylenol 650 mg every 6  hours for fever -Fentanyl for severe pain -Monitor bowel movements -consider reach out to GI for colonoscopy in hospital for tissue biopsy of mass, could set up with oncology outpatient after pending results -AM CBC and BMP  Hypokalemia 3.7 today from 3.5. -Monitor on BMP  Goals of care Continue IV fluids and antibiotics.  Patient would like anything to help per time and symptom management per palliative. Colonoscopy could be considered outpatient to obtain cecal tissue mass. -Palliative care consulted, appreciate assistance likely going to SNF but different than original one  -Will reach out to social work, patient able to communicate decisions  Macrocytic anemia and thrombocytopenia Likely secondary to GI disease, hemoglobin today is stable at 8.3.  -Transfusion threshold hemoglobin less than 7  Cachexia with hypoalbuminemia and protein calorie malnutrition -RD consulted  Osteopenia -PT following  Enlarged prostate UOP 0.8 ml/kg/hr, appropriate.  -Encourage oral intake -Monitor UOP -Total and free PSA today  FEN/GI: Regular PPx: SCDs Dispo:likely SNF pending bed availability, pending GI recs, medically stable  Subjective:  No complaints this AM, would get colonoscopy in hospital but not outpatient, wants to go to different facility than before. Has more of an appetite today.  Objective: Temp:  [98.1 F (36.7 C)-98.9 F (37.2 C)] 98.1 F (36.7 C) (11/09 0436) Pulse Rate:  [77-83] 77 (11/09 0436) Resp:  [18] 18 (11/09 0436) BP: (115-130)/(68-84) 130/84 (11/09 0436) SpO2:  [100 %] 100 % (11/09 0436) Physical Exam: General: NAD, laying comfortable Cardiovascular: RRR no m/r/g Respiratory: CTAB no w/r/c Abdomen: Nondistended, nontender to palpation, drain in place, serosanguinous fluid in bulb, yellow fluid in tube Extremities: No LE edema  Laboratory: Recent Labs  Lab 06/21/21 0539 06/22/21 0301 06/23/21 0136  WBC 14.3* 14.6* 9.0  HGB 8.0*  8.1* 8.3*  HCT  24.6* 25.5* 26.7*  PLT 502* 476* 392   Recent Labs  Lab 06/19/21 1758 06/20/21 0638 06/21/21 0539 06/22/21 0301 06/23/21 0136  NA 132*   < > 131* 134* 133*  K 4.2   < > 3.3* 3.5 3.7  CL 97*   < > 98 101 101  CO2 25   < > 25 25 24   BUN 6*   < > 8 6* 7*  CREATININE 0.66   < > 0.63 0.63 0.60*  CALCIUM 8.3*   < > 7.7* 7.8* 7.7*  PROT 6.0*  --   --   --   --   BILITOT 1.1  --   --   --   --   ALKPHOS 69  --   --   --   --   ALT 21  --   --   --   --   AST 21  --   --   --   --   GLUCOSE 98   < > 105* 99 88   < > = values in this interval not displayed.    Imaging/Diagnostic Tests:   [name], MD 06/23/2021, 6:30 AM PGY-1, [place] Intern pager: [phone], text pages welcome

## 2021-06-23 NOTE — Progress Notes (Signed)
FPTS Brief Progress Note  S:Patient resting comfortably in bed. No complaints or issues from nurse.   O: BP 130/82 (BP Location: Right Arm)   Pulse 77   Temp 98.3 F (36.8 C) (Oral)   Resp 18   Ht 5\' 11"  (1.803 m)   Wt 61.7 kg   SpO2 100%   BMI 18.97 kg/m     A/P: Plans per the day team - Orders reviewed. Labs for AM ordered, which was adjusted as needed.    Holley Bouche, MD 06/23/2021, 1:33 AM PGY-1, [facility] Night Resident  Please page [phone] with questions.

## 2021-06-23 NOTE — Consult Note (Addendum)
Attending physician's note   I have taken an interval history, reviewed the chart and examined the patient. I agree with the Advanced Practitioner's note, impression, and recommendations as outlined.   80 year old male with complex medical history as outlined below.  GI service consulted to discuss whether or not colonoscopy can be performed as inpatient.  History of perforated gastric ulcer with pneumoperitoneum requiring ex-lap w/ Albert Love patch in 08/2019.  Had postop perihepatic and pelvic abscess requiring IR drains in 09/2019.  Unfortunately, cecal mass was noted on imaging at that time, but patient canceled colonoscopy on a few occasions as outlined below.  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  He was evaluated by the inpatient GI service during that admission for cecal mass noted on CT.  Colonoscopy was deferred due to 13 cm x 9 cm RLQ abscess, with recommendation to wait until abscess resolves and drain removed.  IR placed drain in the RLQ abscess on 06/07/2021.  This was upsized to 14 [place] on 06/22/2021.  Imaging at that time with reduction of right lower abdominal/pelvic iliac [place] and iliopsoas musculature abscess measuring 3.1 cm.    He was readmitted 5 days ago with worsening abdominal pain.  Has had interval CT x2 as outlined below along with evaluation by inpatient IR service. RLQ drain in place to suction with dark red fluid in the bulb.  Drain flushed easily by IR service earlier today with recommendation to maintain drain to JP bulb with continued bid flushings.  Plan is for follow-up in the IR clinic next week for IV contrast CT for evaluation management of the drain.  I had a long conversation with the patient today at bedside.  The mass is viewable on multiple imaging studies and almost certainly represents malignancy.  I am not sure  that a diagnostic colonoscopy changes clinical course of an unresectable mass.  Additionally, the risk of performing colonoscopy is quite high.  As this would be purely a diagnostic procedure and not therapeutic, when discussing the risk/benefit profile, he agrees that the risks outweigh the benefits at this juncture.  Depending on clinical course and GOC, could conceivably revisit colonoscopy in a few weeks after resolution of abscess and drain removed, but again would do so if we feel a diagnostic colonoscopy changes clinical management.  He has met with inpatient Palliative Care.  Please feel free to reach out to the on-call GI service with additional questions or concerns.   Albert Heck, DO, [place] [phone] office                                                                                   [facility] Consult: 1:51 PM 06/23/2021  LOS: 4 days    Referring Provider: Dr Albert Love, FPTS  Primary Care Physician:  Albert Damme, MD Primary Gastroenterologist:  Dr. Carlean Love     Reason for Consultation: Patient and teaching service wondering if he could have inpatient colonoscopy sooner rather than waiting for outpatient setting.   HPI: Albert Love is a 80 y.o. male.  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  Underwent percutaneous drainage and omental patch surgery  08/2019.  H. pylori infection, treated with PPI, Flagyl, bismuth, tetracycline x few weeks.  IDA.  Right inguinal hernia.  Retinal artery occlusion.  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 Follow up 09/17/2019 CTAP w contrast showed the drainage catheters in position with improved fluid collection, gastric wall thickening and thickening in the cecum which might be inflammatory but cannot exclude neoplasm.  Initial encounter w GI, Dr. Carlean Love in May 2021.  EGD and colonoscopy, repeat CT planned, pt cancelled these.  Another visit with Albert Love in late September 2022 w plans for EGD, colonoscopy.  At that  time not clear pt compliant w Rx for iron.    Next GI encounter as an inpt (10/22 - 11/1 admission) for couple of days starting 06/06/2021 re malnutrition, significant weight loss, leukocytosis to 30 K, IDA (Hgb 8.2, MCV 65), cecal mass w question necrosis.  Increasing abdominal pain and weakness had brought him to the hospital prior to arrangements for outpt procedures.  06/06/2021 CTAP w contrast: Masslike thickening, irregularity in cecum/proximal ascending colon concerning for malignancy.  This appeared to be contiguous with cecum, proximal ascending colon.  Lobulated mass vs collection contiguous with cecum/prox ascending colon extending to right iliac crest and distal psoas muscle, ? Necrotic malignancy vs abscess.  Dr. Candis Love rec colonoscopy, as well as the delayed EGD, in several weeks after resolution of abscess.   CEA 4.8.   Ultimately underwent percutaneous drain on 06/07/2021.  Abscess grew Streptococcus Constellatus.  Treated with Zosyn from 10/23 - 10/27, then converted to Augmentin.  Surgery recommended continuing broad-spectrum antibiotics for at least 1 week following the drain placement but no antibiotics on discharge medication list, planned IR follow up.  Presented back to the ED 5 d ago.  Worsening abdominal pain, nausea, vomiting. 06/19/2021 CTAP w contrast: Persistent air-fluid collection at right iliac fossa, PERC drain resting within this area.  Collection slightly smaller, appears to communicate with inflamed/thickened cecum.  Concern for cecal/prox ascending malignancy again raised. 06/22/21 CTAP w constrast: Reduction/near resolution of abscess at iliac us/iliopsoas, remnant measures 3.1 cm and now predominantly phlegmonous.  Small free fluid in pelvic cul-de-sac not amenable to drainage.  Cecal/ascending colon masslike thickening concerning for malignancy, no associated obstruction.  Marked prostamegaly and mass-effect on urinary bladder.  Recommend maintaining existing  drain.  Additional and ongoing problems include severe protein calorie malnutrition, cachexia.    WBC 12.4  >> 14.6 >> 9.  Hb 8.3, MCV 66.  Receiving Tylenol as needed for fever, fentanyl for pain.  Started 11 d Augmentin on 11/8.  Med list from SNF includes Pepcid, iron sulfate, Reglan, assume compliance with these since he has been at SNF  Patient has expressed that he would like to have colonoscopy as an inpt.  Medical team has reached out to [place] for consideration of pursuing inpt colonoscopy.  Ultimately will discharge to SNF. Reports complete improvement in abdominal pain. Perc drain output 30 mL, 50 mL on previous 2 days.   Although he had recently been at [facility] rehab, lives alone at home prior to recent admission  Past Medical History:  Diagnosis Date   Acute renal insufficiency 09/05/2019   CRAO (central retinal artery occlusion)    Perforated gastric ulcer s/p omental Albert Love patch 09/05/2019 09/05/2019    Past Surgical History:  Procedure Laterality Date   BOWEL RESECTION N/A 09/05/2019   Procedure: SMALL BOWEL RESECTION;  Surgeon: Clovis Riley, MD;  Location: [facility];  Service: General;  Laterality: N/A;   IR CATHETER  TUBE CHANGE  06/22/2021   IR SINUS/FIST TUBE CHK-NON GI  09/19/2019   IR SINUS/FIST TUBE CHK-NON GI  09/19/2019   LAPAROTOMY N/A 09/05/2019   Procedure: EXPLORATORY LAPAROTOMY WITH [facility] AND GASTRIC BIOPSY;  Surgeon: Clovis Riley, MD;  Location: [facility];  Service: General;  Laterality: N/A;    Prior to Admission medications   Medication Sig Start Date End Date Taking? Authorizing Provider  aspirin 81 MG chewable tablet Chew 1 tablet (81 mg total) by mouth daily. 01/21/21  Yes Albert Damme, MD  atorvastatin (LIPITOR) 80 MG tablet Take 80 mg by mouth daily.   Yes [provider]  famotidine (PEPCID) 10 MG tablet Take 1 tablet (10 mg total) by mouth daily. 06/16/21  Yes Sowell, Erlene Quan, MD  feeding supplement (ENSURE ENLIVE / ENSURE PLUS) LIQD Take  237 mLs by mouth 3 (three) times daily between meals. 06/15/21  Yes Holley Bouche, MD  ferrous sulfate 325 (65 FE) MG EC tablet Take 1 tablet (325 mg total) by mouth daily with breakfast. 05/12/21  Yes Gatha Mayer, MD  metoCLOPramide (REGLAN) 10 MG tablet Take 1 tablet (10 mg total) by mouth 3 (three) times daily before meals. 06/15/21  Yes Sowell, Erlene Quan, MD  Multiple Vitamin (MULTIVITAMIN WITH MINERALS) TABS tablet Take 1 tablet by mouth daily. 06/16/21  Yes Sowell, Erlene Quan, MD  oxyCODONE 10 MG TABS Take 1 tablet (10 mg total) by mouth every 6 (six) hours. 06/15/21  Yes Holley Bouche, MD    Scheduled Meds:  amoxicillin-clavulanate  1 tablet Oral Q12H   baclofen  5 mg Oral TID   feeding supplement  237 mL Oral BID BM   multivitamin with minerals  1 tablet Oral Daily   sodium chloride flush  5 mL Intracatheter Q8H   sodium chloride flush  5 mL Intracatheter Q8H   Infusions:  PRN Meds: acetaminophen, fentaNYL (SUBLIMAZE) injection, lidocaine, oxyCODONE, promethazine   Allergies as of 06/19/2021   (No Known Allergies)    Family History  Problem Relation Age of Onset   Colon cancer Neg Hx    Pancreatic cancer Neg Hx    Esophageal cancer Neg Hx     Social History   Socioeconomic History   Marital status: Single    Spouse name: Not on file   Number of children: Not on file   Years of education: Not on file   Highest education level: Not on file  Occupational History   Not on file  Tobacco Use   Smoking status: Every Day    Packs/day: 0.50    Types: Cigarettes   Smokeless tobacco: Never  Substance and Sexual Activity   Alcohol use: Yes    Comment: occasionally    Drug use: Not on file   Sexual activity: Not on file  Other Topics Concern   Not on file  Social History Narrative   08/2019 moved to [place] from [place]     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  Does not think he has regained any weight.  Current BMI is 18.9, weight 61.7 kg ENT:  No nose bleeds Pulm: Shortness of breath without cough CV:  No palpitations, no LE edema.  GU:  No hematuria, no frequency GI: See HPI.  No nausea, vomiting.  Appetite better Heme: Denies excessive or unusual bleeding or bruising. Transfusions: No prior blood transfusion Neuro:  No headaches, no peripheral tingling or numbness Derm:  No itching, no rash or sores.  Endocrine:  No sweats or chills.  No polyuria or dysuria Immunization: Reviewed, there are no vaccination records documented. Travel:  None beyond local counties in last few months.    PHYSICAL EXAM: Vital signs in last 24 hours: Vitals:   06/23/21 0436 06/23/21 0735  BP: 130/84 (!) 119/91  Pulse: 77 77  Resp: 18 16  Temp: 98.1 F (36.7 C) 98.1 F (36.7 C)  SpO2: 100% 100%   Wt Readings from Last 3 Encounters:  06/19/21 61.7 kg  06/05/21 61.7 kg  05/12/21 61 kg    General: Thin, somewhat unwell but comfortable appearing.  Alert and pleasant. Head: No facial asymmetry or swelling.  No signs of head trauma. Eyes: Conjunctiva pink. Ears: Not hard of hearing Nose: No congestion or discharge Mouth: Dentures in place.  Mucosa is moist, pink, clear.  Tongue midline. Neck: No JVD, no masses. Lungs: Clear bilaterally without labored breathing or cough. Heart: RRR.  No MRG.  S1, S2 present. Abdomen: Thin, soft.  Tender on top of the percutaneous drain site which is covered with a bandage.  Bandage not removed.  Drainage clear/brown.  No palpable masses or organomegaly.  No hernias..   Rectal: No DRE performed.  No sacral  decubitus. Musc/Skeltl: Thin arms and legs.   Extremities:  Slight, nonpitting swelling at the ankles.  Neurologic: Oriented x3.  Moves all 4 limbs without tremor or gross weakness.  Strength not tested.  Good historian. Skin: No sores, no rashes, no suspicious lesions. Nodes: No cervical adenopathy Psych: Calm, cooperative, pleasant.  Intake/Output from previous day: 11/08 0701 - 11/09 0700 In: 720 [P.O.:720] Out: 1200 [Urine:1150; Drains:50] Intake/Output this shift: No intake/output data recorded.  LAB RESULTS: Recent Labs    06/21/21 0539 06/22/21 0301 06/23/21 0136  WBC 14.3* 14.6* 9.0  HGB 8.0* 8.1* 8.3*  HCT 24.6* 25.5* 26.7*  PLT 502* 476* 392   BMET Lab Results  Component Value Date   NA 133 (L) 06/23/2021   NA 134 (L) 06/22/2021   NA 131 (L) 06/21/2021   K 3.7 06/23/2021   K 3.5 06/22/2021   K 3.3 (L) 06/21/2021   CL 101 06/23/2021   CL 101 06/22/2021   CL 98 06/21/2021   CO2 24 06/23/2021   CO2 25 06/22/2021   CO2 25 06/21/2021   GLUCOSE 88 06/23/2021   GLUCOSE 99 06/22/2021   GLUCOSE 105 (H) 06/21/2021   BUN 7 (L) 06/23/2021   BUN 6 (L) 06/22/2021   BUN 8 06/21/2021   CREATININE 0.60 (L) 06/23/2021   CREATININE 0.63 06/22/2021   CREATININE 0.63 06/21/2021   CALCIUM 7.7 (L) 06/23/2021   CALCIUM 7.8 (L) 06/22/2021   CALCIUM 7.7 (L) 06/21/2021   LFT No results for input(s): PROT, ALBUMIN, AST, ALT, ALKPHOS, BILITOT, BILIDIR, IBILI in the last 72 hours. PT/INR Lab Results  Component Value Date   INR 1.4 (H) 06/19/2021   INR 1.1 10/19/2020   INR 1.3 (H) 09/14/2019   Hepatitis Panel No results for input(s): HEPBSAG, HCVAB, HEPAIGM, HEPBIGM in the last 72 hours. C-Diff No components  found for: CDIFF Lipase     Component Value Date/Time   LIPASE 23 06/05/2021 2328    Drugs of Abuse  No results found for: LABOPIA, COCAINSCRNUR, LABBENZ, AMPHETMU, THCU, LABBARB   RADIOLOGY STUDIES: CT ABDOMEN PELVIS W CONTRAST  Result Date:  06/23/2021 CLINICAL DATA:  Pericecal/retroperitoneal abscess, post percutaneous drainage catheter placement on 06/07/2021 EXAM: CT ABDOMEN AND PELVIS WITH CONTRAST TECHNIQUE: Multidetector CT imaging of the abdomen and pelvis was performed using the standard protocol following bolus administration of intravenous contrast. CONTRAST:  15m OMNIPAQUE IOHEXOL 350 MG/ML SOLN COMPARISON:  CT abdomen pelvis-06/19/2021; 06/06/2021; 09/13/2019; 09/05/2019 CT-guided per drainage catheter placement-06/07/2021; 09/14/2019 Drainage catheter injection and up sizing-08/22/2020 FINDINGS: Lower chest: Limited visualization of the lower thorax demonstrates minimal bibasilar subsegmental atelectasis/collapse, left greater than right. No discrete focal airspace opacities. No pleural effusion Normal heart size. Coronary artery calcifications. Calcifications involving the aortic valve leaflets. No pericardial effusion. Hepatobiliary: Normal hepatic contour. Subcentimeter hypoattenuating lesion within the dome of the left lobe of the liver (image 20, series 3), as well as the tiny (approximately 4 mm) hypoattenuating lesion within the caudal aspect of the right lobe of the liver (image 33, series 3), are both too small to accurately characterize though both unchanged compared to remote abdominal CT performed 08/2019. No discrete worrisome hepatic lesions. Normal appearance of the gallbladder given degree distention. No radiopaque gallstones. No intra or extrahepatic biliary dilatation. No ascites. Pancreas: Normal appearance of the pancreas. Spleen: Normal appearance of the spleen. Adrenals/Urinary Tract: There is symmetric enhancement and excretion of the bilateral kidneys. Subcentimeter hypoattenuating renal lesions are too small to adequately characterize though favored to represent renal cysts. No definite evidence of nephrolithiasis on this postcontrast examination. No urine obstruction or perinephric stranding. Normal appearance of  the bilateral adrenal glands. Mild diffuse thickening the urinary bladder wall, unchanged. Stomach/Bowel: Redemonstrated masslike thickening involving the cecum and proximal ascending colon. Moderate colonic stool burden without evidence of enteric obstruction. No pneumoperitoneum, pneumatosis or portal venous gas. Stable positioning of right lower abdominal quadrant percutaneous drainage catheter with near complete resolution of the right iliac CIS and iliopsoas intramuscular abscess with tiny component extending about the anterior aspect of the proximal femur measuring approximately 2.1 x 2.3 cm (image 83, series 3), though now appears predominantly phlegmonous. There is a small amount of free fluid within the pelvic cul-de-sac without peripheral wall enhancement to suggest definable/drainable fluid collection. Vascular/Lymphatic: Moderate amount of atherosclerotic plaque within a normal caliber abdominal aorta, not resulting in hemodynamically significant stenosis. The Albert branch vessels of the abdominal aorta appear patent on this non CTA examination. No definitive bulky retroperitoneal, mesenteric, pelvic or inguinal lymphadenopathy. Reproductive: Prostatomegaly with mass effect on the undersurface of the urinary bladder. Other: Mild diffuse body wall anasarca Musculoskeletal: No acute or aggressive osseous abnormalities. Mild multilevel lumbar spine DDD, worse at L2-L3 and L3-L4 with disc space height loss, endplate irregularity and sclerosis. Mild-to-moderate degenerative change the bilateral hips, right greater than left. Note is made of a right-sided os acetabuli. IMPRESSION: 1. Interval reduction/near resolution of right lower abdominal/pelvic iliacus and iliopsoas musculature abscess with tiny component remaining about the proximal anterior aspect of the right femur measuring approximately 3.1 cm in diameter though now appears predominantly phlegmonous. 2. Small amount of free fluid the pelvic  cul-de-sac without new definable/drainable fluid collection within the abdomen or pelvis. 3. Masslike thickening involving the cecum and ascending colon, nonspecific though worrisome for malignancy. No evidence of enteric obstruction. Further evaluation with colonoscopy after the  resolution of acute symptoms is advised. 4. Marked prostatomegaly with mass effect on the undersurface of the urinary bladder. If not recently performed, further evaluation with DRE is advised. 5.  Aortic Atherosclerosis (ICD10-I70.0). PLAN: - Recommend maintaining the drainage catheter to a JP bulb, continued b.i.d. flushing with 10 cc normal saline and diligent records regarding daily drainage catheter output - Assuming the patient's interval discharge, the patient may follow-up at the interventional radiology drain clinic next week (week of November 14) for IV only contrast CT of the abdomen and pelvis, evaluation and management and drainage catheter injection. Electronically Signed   By: Sandi Mariscal M.D.   On: 06/23/2021 08:43   IR Catheter Tube Change  Result Date: 06/22/2021 INDICATION: 80 year old with a right colonic mass and large retroperitoneal abscess with existing drain. Patient is having persistent fluid leakage around the drainage catheter. EXAM: 1. Drain injection 2. Drain exchange with up sizing MEDICATIONS: Local anesthetic, 1% lidocaine ANESTHESIA/SEDATION: None FLUOROSCOPY TIME:  48 seconds, 2 mGy CONTRAST:  15 mL Omnipaque 737 COMPLICATIONS: None immediate. PROCEDURE: Informed written consent was obtained from the patient after a thorough discussion of the procedural risks, benefits and alternatives. All questions were addressed. A timeout was performed prior to the initiation of the procedure. Patient was placed supine. The right lower abdomen and existing drain were prepped and draped in sterile fashion. Maximal barrier sterile technique was utilized including caps, mask, sterile gowns, sterile gloves, sterile  drape, hand hygiene and skin antiseptic. Contrast was injected through the drain. The drain was cut and removed over a Bentson wire. A new 14 French pigtail drain was easily advanced over the wire and placed within the large abscess cavity. Approximately 100 mL of yellow purulent fluid was aspirated from the collection. Gas and bloody fluid was aspirated after all of the yellow fluid was removed. Skin around the catheter was anesthetized with 1% lidocaine and the catheter was sutured to skin with Prolene suture. FINDINGS: Large residual abscess cavity in the right lower abdomen corresponding with the previous CT findings. Approximately 100 mL of predominantly yellow fluid was removed along with a large amount of gas. 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 IMPRESSION: 1. Successful exchange and up sizing of the right lower abdominal abscess drain. The patient now has a 14 [place] drain. Electronically Signed   By: Markus Daft M.D.   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    Abscess in region of cecal mass, perc drain placed 10/24, grew Strep Constellatus.  Treated zosyn >> Augmentin at recent admit, now on antoher 11 d course Augmentin.  Most recent CT of 11/8 shows reduction in the size of the mass, IR recommending drain stay in place.    FTT.  Protein cal malnutrition.  Recorded to be consuming 75% of meals yesterday and pt endorses improved po intake.  Ensure Enlive bid in place.  Abdominal pain, resolved..  Has oxycodone prn available.    Iron deficiency anemia.   PLAN:     Risk of perforation is high if we pursue colonoscopy now.  This was discussed with pt and he understands.  Will need to pursue colonoscopy with biopsy in a few weeks.  As of yet, he does not have GI follow-up appointment arranged.  We will plan to make arrangements for follow-up w Dr Albert Love or APP.   Azucena Freed  06/23/2021, 1:51  PM  Phone [phone]

## 2021-06-23 NOTE — TOC Progression Note (Signed)
Transition of Care [facility]) - Progression Note    Patient Details  Name: Albert Love MRN: [id number] Date of Birth: Nov 16, 1940  [facility] CM/SW Contact  Emeterio Reeve, [place] Phone Number: 06/23/2021, 4:37 PM  Clinical Narrative:      CSW was informed that previous CSW spoke to pts nephew who chose [facility]. [place] does not anticipate a bed for pt for a few days. CSW informed pt of other bed offers. Pt stated he wants to stay at the hospital. CSW explained that is not possible and will likely hae a DC order for tomorrow. Pt expressed understanding. Pt stated he needed to talk to his nephew about the other bed offers. CSW started insurance auth with "pending" facility.   CSW will continue to follow.      Expected Discharge Plan and Services                                                 Social Determinants of Health (SDOH) Interventions    Readmission Risk Interventions No flowsheet data found.  Emeterio Reeve, LCSW Clinical Social Worker

## 2021-06-24 DIAGNOSIS — K651 Peritoneal abscess: Secondary | ICD-10-CM | POA: Diagnosis not present

## 2021-06-24 LAB — CBC
HCT: 25.6 % — ABNORMAL LOW (ref 39.0–52.0)
Hemoglobin: 8 g/dL — ABNORMAL LOW (ref 13.0–17.0)
MCH: 20.7 pg — ABNORMAL LOW (ref 26.0–34.0)
MCHC: 31.3 g/dL (ref 30.0–36.0)
MCV: 66.1 fL — ABNORMAL LOW (ref 80.0–100.0)
Platelets: UNDETERMINED 10*3/uL (ref 150–400)
RBC: 3.87 MIL/uL — ABNORMAL LOW (ref 4.22–5.81)
RDW: 18.6 % — ABNORMAL HIGH (ref 11.5–15.5)
WBC: 7 10*3/uL (ref 4.0–10.5)
nRBC: 0 % (ref 0.0–0.2)

## 2021-06-24 LAB — COMPREHENSIVE METABOLIC PANEL
ALT: 33 U/L (ref 0–44)
AST: 44 U/L — ABNORMAL HIGH (ref 15–41)
Albumin: 1.6 g/dL — ABNORMAL LOW (ref 3.5–5.0)
Alkaline Phosphatase: 52 U/L (ref 38–126)
Anion gap: 5 (ref 5–15)
BUN: 6 mg/dL — ABNORMAL LOW (ref 8–23)
CO2: 27 mmol/L (ref 22–32)
Calcium: 7.7 mg/dL — ABNORMAL LOW (ref 8.9–10.3)
Chloride: 102 mmol/L (ref 98–111)
Creatinine, Ser: 0.69 mg/dL (ref 0.61–1.24)
GFR, Estimated: >60 mL/min (ref >60)
Glucose, Bld: 107 mg/dL — ABNORMAL HIGH (ref 70–99)
Potassium: 3.5 mmol/L (ref 3.5–5.1)
Sodium: 134 mmol/L — ABNORMAL LOW (ref 135–145)
Total Bilirubin: 0.2 mg/dL — ABNORMAL LOW (ref 0.3–1.2)
Total Protein: 4.8 g/dL — ABNORMAL LOW (ref 6.5–8.1)

## 2021-06-24 LAB — RESP PANEL BY RT-PCR (FLU A&B, COVID) ARPGX2
Influenza A by PCR: NEGATIVE
Influenza B by PCR: NEGATIVE
SARS Coronavirus 2 by RT PCR: POSITIVE — AB

## 2021-06-24 LAB — SARS CORONAVIRUS 2 (TAT 6-24 HRS): SARS Coronavirus 2: POSITIVE — AB

## 2021-06-24 LAB — CULTURE, BLOOD (ROUTINE X 2)
Culture: NO GROWTH
Special Requests: ADEQUATE

## 2021-06-24 LAB — PSA, TOTAL AND FREE
PSA, Free Pct: 8.8 %
PSA, Free: 0.87 ng/mL
Prostate Specific Ag, Serum: 9.9 ng/mL — ABNORMAL HIGH (ref 0.0–4.0)

## 2021-06-24 MED ORDER — BACLOFEN 10 MG PO TABS
5.0000 mg | ORAL_TABLET | Freq: Two times a day (BID) | ORAL | Status: DC
  Administered 2021-06-24 – 2021-06-30 (×12): 5 mg via ORAL
  Filled 2021-06-24 (×12): qty 1

## 2021-06-24 MED ORDER — MELATONIN 5 MG PO TABS: 5.0000 mg | ORAL_TABLET | Freq: Every day | ORAL | 0 refills | Status: DC

## 2021-06-24 MED ORDER — TAMSULOSIN HCL 0.4 MG PO CAPS
0.4000 mg | ORAL_CAPSULE | Freq: Every day | ORAL | Status: DC
  Administered 2021-06-24 – 2021-06-30 (×7): 0.4 mg via ORAL
  Filled 2021-06-24 (×7): qty 1

## 2021-06-24 MED ORDER — BACLOFEN 5 MG PO TABS: 5.0000 mg | ORAL_TABLET | Freq: Three times a day (TID) | ORAL | 0 refills | Status: DC

## 2021-06-24 MED ORDER — TAMSULOSIN HCL 0.4 MG PO CAPS: 0.4000 mg | ORAL_CAPSULE | Freq: Every day | ORAL | Status: DC

## 2021-06-24 NOTE — Social Work (Signed)
Pts discharge was cancelled due to positive covid results.   CSW will continue to follow.   Emeterio Reeve, LCSW Clinical Social Worker

## 2021-06-24 NOTE — Progress Notes (Signed)
FPTS Brief Progress Note  S:Patient resting comfortably in bed. Nurse said there were no complaints and that the melatonin worked well.   O: BP 140/78 (BP Location: Left Arm)   Pulse 76   Temp 98.4 F (36.9 C) (Oral)   Resp 16   Ht 5\' 11"  (1.803 m)   Wt 61.7 kg   SpO2 100%   BMI 18.97 kg/m     A/P: Intra-abdominal infection (sepsis resolved)  abdominal pain  cecal mass Plans per the day team - Orders reviewed. Labs for AM ordered, which was adjusted as needed.   Holley Bouche, MD 06/24/2021, 3:16 AM PGY-1, [facility] Night Resident  Please page [phone] with questions.

## 2021-06-24 NOTE — Progress Notes (Addendum)
Family Medicine Teaching Service Daily Progress Note Intern Pager: [phone]  Patient name: [place] record number: [id number] Date of birth: 08/20/1940 Age: 80 y.o. 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 White blood cell count stable at 7 from 9.  Drain in place.  Hiccups controlled with baclofen.  -GI consulted yesterday, appreciate recommendations, high likely malignancy and colonoscopy risk versus benefit was discussed and risk of perforation outweighs benefit currently.  Patient understand and can consider in a few weeks after resolution of abscess and drain removal. -Augmentin for 875 mg 10 more days -Baclofen 5 mg tid for hiccups -Tylenol 650 mg every 6 hours for fever -Fentanyl for severe pain -AM CBC and BMP  Hypokalemia 3.5 from 3.7. -Monitor  Goals of care -Palliative consulted, appreciate care likely going to SNF different than original 1 -Social work working on bed placement  Macrocytic anemia and thrombocytopenia Can secondary to GI disease hemoglobin stable at 8 -Transfusion threshold hemoglobin less than  7  Enlarged prostate Urine output 1 ml/kg/hr only charted on second shift. -monitor UOP -tamsulosin 0.4 mg -consider urology outpatient -PSA 9.9, free pSA 0.87 and free % 8.8.  FEN/GI: Regular PPx: SCDs Dispo:SNF pending bed placement.   Subjective:  No complaint this morning, would like to know which facility to go to  Objective: Temp:  [97.8 F (36.6 C)-98.4 F (36.9 C)] 97.8 F (36.6 C) (11/10 0550) Pulse Rate:  [76-80] 78 (11/10 0550) Resp:  [16-18] 18 (11/10 0550) BP: (119-146)/(78-93) 146/93 (11/10 0550) SpO2:  [99 %-100 %] 99 % (11/10 0550) Physical Exam: General: NAD, laying in bed, responsive Cardiovascular: RRR no m/r/g Respiratory: CTAB no iWOB no w/r/c Abdomen: Nontender to palpation, soft,  Extremities: No LE edema  Laboratory: Recent Labs  Lab 06/22/21 0301 06/23/21 0136 06/24/21 0156  WBC 14.6* 9.0 7.0  HGB 8.1* 8.3* 8.0*  HCT 25.5* 26.7* 25.6*  PLT 476* 392 PLATELET CLUMPS NOTED ON SMEAR, UNABLE TO ESTIMATE   Recent Labs  Lab 06/19/21 1758 06/20/21 0638 06/22/21 0301 06/23/21 0136 06/24/21 0156  NA 132*   < > 134* 133* 134*  K 4.2   < > 3.5 3.7 3.5  CL 97*   < > 101 101 102  CO2 25   < > 25 24 27   BUN 6*   < > 6* 7* 6*  CREATININE 0.66   < > 0.63 0.60* 0.69  CALCIUM 8.3*   < > 7.8* 7.7* 7.7*  PROT 6.0*  --   --   --  4.8*  BILITOT 1.1  --   --   --  0.2*  ALKPHOS 69  --   --   --  52  ALT 21  --   --   --  33  AST 21  --   --   --  44*  GLUCOSE 98   < > 99 88 107*   < > = values in this interval not displayed.    Imaging/Diagnostic Tests:   [name], MD 06/24/2021, 6:43 AM PGY-1, [place] Intern pager: [phone], text pages welcome

## 2021-06-24 NOTE — Discharge Summary (Deleted)
[facility] Discharge Summary  Patient name: [facility] record number: [id number] Date of birth: 06-27-1941 Age: 80 y.o. Gender: male Date of Admission: 06/19/2021  Date of Discharge: 06/24/2021 Admitting Physician: Gladys Damme, MD  Primary Care Provider: Gladys Damme, MD Consultants: GI, IR, Gen Surg, Palliative  Indication for Hospitalization: Sepsis from intrabdominal infection  Discharge Diagnoses/Problem List:  Hypertension Anemia Colonic Mass Protein calorie malnutrition severe Intraabdominal abscess Leukocytosis Sepsis  Disposition: SNF  Discharge Condition: Stable  Discharge Exam:   General: NAD, awake, alert, responsive to questions Head: Normocephalic atraumatic CV: Regular rate and rhythm no murmurs rubs or gallops Respiratory: Clear to ausculation bilaterally, no wheezes rales or crackles, chest rises symmetrically,  no increased work of breathing Abdomen: Soft, non-tender, non-distended, normoactive bowel sounds, drain in place with yellow fluid in bulb Extremities: Moves upper and lower extremities freely, no edema in LE Neuro: No focal deficits  Brief Hospital Course:  Al[facility] is a 80 year old male presented with sepsis of intra-abdominal source secondary to cecal tumor.  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 His medical course is below.  Sepsis due to intra-abdominal infection  retroperitoneal abscess  abdominal pain  cecal fistula / mass Patient admitted October 22 - November 1 and found to have inoperable cecal mass that perforated into retroperitoneum. He underwent  IR drain placement 06/07/21 into right sided retroperitoneal fluid collection. Patient discharged home on 06/15/21.  Patient presented 06/19/21 with fever, increasing right-sided abdominal pain, and increasing purulent drainage into right-sided JP drain.  He met sepsis criteria with fever, tachycardia to 120s, leukocytosis to 12.4 with left shift given ANC of 10.  CT scan showed fistula communicating with cecum. Source of infection was intra-abdominal given CT findings and purulent JP drainage.  CXR with no disease process. In the ED, patient received 2L NS and was started on zosyn, after blood cultures were obtained. Surgery was consulted and determined that the mass was still inoperable. IR was consulted and decided to exchange the drain on 06/22/21. 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 Also showed marked prostatomegaly with mass effect on urinary bladder. GI was consulted for diagnostic colonoscopy of the unresectable mass.The risk of performing colonoscopy is quite high and would be purely a diagnostic procedure and not therapeutic, the risks outweigh the benefits per GI. Depending on clinical course and GOC, could revisit colonoscopy in a few weeks after resolution of abscess and drain removed for oncology follow up.  Hiccups Patient presented with hiccups for last 2-3 weeks that were aggravating abdominal pain. During admission, patient was treated with Zofran, Reglan, and Thorazine, but continued to have hiccups. Patient later started on Baclofen 5 mg TID and appeared to have resolution of symptoms.   Electrolyte abnormalities Patient presented with Hyponatremia to 132, hypochloremia to 97 in the setting of dehydration. Electrolyte disturbance was resolved with gentle maintenance IVF. While admitted, patient K was noted to be 3.3, and was  repleted with K supplement.   Macrocytic anemia and thrombocytosis Patient presented with a Hgb of 9.9 with a Plt of 517. Hgb and platelets were stable during admission, patient didn't  require a transfusion. His transfusion threshold was 7.   Enlarged prostate Urine output 1 ml/kg/hr only charted on second shift. CTA showed marked prostatomegaly with mass effect on the undersurface of the urinary bladder. Started on tamsulosin 0.4 mg at discharge. PSA was 9.9, free pSA 0.87 and free % 8.8.  [facility] Patient following with Palliative Care outpatient. He signed a MOST form agreeing to DNR/DNI, limited use of anx/IVF and no feeding tube. Last hospitalization patient decided  he would go home with hospice and his nephew [name] had agreed to care for him, who later had a change of heart. Basel therefore went to [facility] for rehabilitation with the idea of transition to hospice thereafter though he returned septic w/in a week. He did not qualify for any hospice facilities due to antibiotics use. He returned back to SNF with palliative following outpatient.       Issues for Follow Up:  Obtain 2nd opinion from surgery outpatient as patietn was evaluated by surgery in same group  Recommend maintaining the drainage catheter to a JP bulb, continued b.i.d. flushing with 10 cc normal saline and diligent records regarding daily drainage catheter output Follow-up at the interventional radiology drain clinic next week (week of November 14) for IV only contrast CT of the abdomen and pelvis, evaluation and management and drainage catheter injection. F/u GI outpatient if colonoscopy wanted for tissue biopsy/to be able to follow with oncology Consider urology outpatient follow up for prostate enlargement  Significant Procedures: 11/8 Drain injection with drain upsize  Significant Labs and Imaging:  Recent Labs  Lab 06/22/21 0301 06/23/21 0136 06/24/21 0156  WBC 14.6* 9.0 7.0  HGB 8.1* 8.3* 8.0*  HCT  25.5* 26.7* 25.6*  PLT 476* 392 PLATELET CLUMPS NOTED ON SMEAR, UNABLE TO ESTIMATE   Recent Labs  Lab 06/19/21 1758 06/20/21 0638 06/21/21 0539 06/22/21 0301 06/23/21 0136 06/24/21 0156  NA 132* 131* 131* 134* 133* 134*  K 4.2 3.9 3.3* 3.5 3.7 3.5  CL 97* 98 98 101 101 102  CO2 25 23 25 25 24 27   GLUCOSE 98 85 105* 99 88 107*  BUN 6* 8 8 6* 7* 6*  CREATININE 0.66 0.55* 0.63 0.63 0.60* 0.69  CALCIUM 8.3* 7.7* 7.7* 7.8* 7.7* 7.7*  ALKPHOS 69  --   --   --   --  52  AST 21  --   --   --   --  44*  ALT 21  --   --   --   --  33  ALBUMIN 2.1*  --   --   --   --  1.6*     Results/Tests Pending at Time of Discharge:   Discharge Medications:  Allergies as of 06/24/2021   No Known Allergies      Medication List     STOP taking these medications    metoCLOPramide 10 MG tablet Commonly known as: REGLAN   Oxycodone HCl 10 MG Tabs       TAKE these medications    aspirin 81 MG chewable tablet Chew 1 tablet (81 mg total) by mouth daily.   atorvastatin 80 MG tablet Commonly known as: LIPITOR Take 80 mg by mouth daily.   Baclofen 5 MG Tabs Take 5 mg by mouth 3 (three) times daily.   famotidine 10 MG tablet Commonly known as: PEPCID Take 1 tablet (10 mg total) by mouth daily.   feeding supplement Liqd Take 237 mLs by mouth 3 (three) times daily between meals.  ferrous sulfate 325 (65 FE) MG EC tablet Take 1 tablet (325 mg total) by mouth daily with breakfast.   melatonin 5 MG Tabs Take 1 tablet (5 mg total) by mouth at bedtime.   multivitamin with minerals Tabs tablet Take 1 tablet by mouth daily.   tamsulosin 0.4 MG Caps capsule Commonly known as: FLOMAX Take 1 capsule (0.4 mg total) by mouth daily. Start taking on: June 25, 2021        Discharge Instructions: Please refer to Patient Instructions section of EMR for full details.  Patient was counseled important signs and symptoms that should prompt return to medical care, changes in medications,  dietary instructions, activity restrictions, and follow up appointments.   Follow-Up Appointments:   [name], MD 06/24/2021, 12:47 PM PGY-1, [place]

## 2021-06-24 NOTE — Progress Notes (Signed)
Mobility Specialist Progress Note:   06/24/21 1130  Mobility  Activity Ambulated in room  Level of Assistance Moderate assist, patient does 50-74%  Assistive Device Front wheel walker  Distance Ambulated (ft) 60 ft  Mobility Ambulated with assistance in room  Mobility Response Tolerated fair  Mobility performed by Mobility specialist  $Mobility charge 1 Mobility   Pt required mod A to stand from EOB. Contact G during ambulation, had x1 LOB during. Pt displayed significant fatigue post-mobility, but was pleased with distance.    Nelta Numbers Mobility Specialist  Phone [phone]

## 2021-06-24 NOTE — Progress Notes (Signed)
Referring Physician(s): Janne Napoleon NP  Supervising Physician: Mir, Sharen Heck  Patient Status:  [facility] - In-pt  Chief Complaint:  Abscess drain follow up  Brief History:  Albert Love is a 80 yo with medical issues including  HTN, anemia, HLD, and perforated gastric ulcer with pneumoperitoneum s/p Silvestre Gunner 09/05/19.    He was initially admitted 06/06/21 with failure to thrive.  CT  06/06/21 showed: Multilobulated low attenuating mass or collection appears somewhat contiguous with the cecum and proximal ascending colon and extends into the right iliacus and distal psoas muscle. This is concerning for a necrotic malignancy although an infected collection or abscess is not excluded.  He underwent RLQ abscess drain on 06/07/21 by Dr. Dwaine Gale.  Discharged on 06/15/2021.  Returned the ED on 06/19/2021 with fatigue, weakness, fevers, right-sided abdominal pain, as well as increased drainage into a right-sided JP drain.   CT done 06/19/2021 showed: Persistent air-fluid collection identified in the right iliac fossa with percutaneous drainage catheter within. It is slightly smaller than that seen on the prior exam from 06/06/2021. It appears to communicate with the thickened and inflamed cecum as described best noted on the coronal images.   Drain was up sized to a 14 F r on 06/22/21 by Dr. Anselm Pancoast  Subjective:  Doing well. No complaints.  Allergies: Patient has no known allergies.  Medications: Prior to Admission medications   Medication Sig Start Date End Date Taking? Authorizing Provider  aspirin 81 MG chewable tablet Chew 1 tablet (81 mg total) by mouth daily. 01/21/21  Yes Gladys Damme, MD  atorvastatin (LIPITOR) 80 MG tablet Take 80 mg by mouth daily.   Yes [provider]  famotidine (PEPCID) 10 MG tablet Take 1 tablet (10 mg total) by mouth daily. 06/16/21  Yes Sowell, Erlene Quan, MD  feeding supplement (ENSURE ENLIVE / ENSURE PLUS) LIQD Take 237 mLs by mouth  3 (three) times daily between meals. 06/15/21  Yes Holley Bouche, MD  ferrous sulfate 325 (65 FE) MG EC tablet Take 1 tablet (325 mg total) by mouth daily with breakfast. 05/12/21  Yes Gatha Mayer, MD  metoCLOPramide (REGLAN) 10 MG tablet Take 1 tablet (10 mg total) by mouth 3 (three) times daily before meals. 06/15/21  Yes Sowell, Erlene Quan, MD  Multiple Vitamin (MULTIVITAMIN WITH MINERALS) TABS tablet Take 1 tablet by mouth daily. 06/16/21  Yes Sowell, Erlene Quan, MD  oxyCODONE 10 MG TABS Take 1 tablet (10 mg total) by mouth every 6 (six) hours. 06/15/21  Yes Holley Bouche, MD     Vital Signs: BP 137/83 (BP Location: Right Arm)   Pulse 86   Temp (!) 97.5 F (36.4 C) (Oral)   Resp 16   Ht 5\' 11"  (1.803 m)   Wt 61.7 kg   SpO2 100%   BMI 18.97 kg/m   Physical Exam Constitutional:      Appearance: Normal appearance.  HENT:     Head: Normocephalic and atraumatic.  Cardiovascular:     Rate and Rhythm: Normal rate.  Pulmonary:     Effort: Pulmonary effort is normal. No respiratory distress.  Abdominal:     Palpations: Abdomen is soft.  Skin:    General: Skin is warm and dry.  Neurological:     General: No focal deficit present.     Mental Status: He is alert and oriented to person, place, and time.  Psychiatric:        Mood and Affect: Mood normal.  Behavior: Behavior normal.   Drain Location: RLQ Size: Fr size: 14 Fr Date of placement: 10/24 then 11/08 Currently to: Drain collection device: suction bulb 24 hour output:  Output by Drain (mL) 06/22/21 0701 - 06/22/21 1900 06/22/21 1901 - 06/23/21 0700 06/23/21 0701 - 06/23/21 1900 06/23/21 1901 - 06/24/21 0700 06/24/21 0701 - 06/24/21 1255  Closed System Drain 1 Lateral;Right Abdomen Bulb (JP) 14 Fr.  50  70     Interval imaging/drain manipulation:  Upsized by Dr. Anselm Pancoast 06/22/21  Current examination: Flushes/aspirates easily.  Insertion site unremarkable. Suture and stat lock in place. Dressed appropriately.   Drainage is thick/purulent in bulb.   Imaging: CT ABDOMEN PELVIS W CONTRAST  Result Date: 06/23/2021 CLINICAL DATA:  Pericecal/retroperitoneal abscess, post percutaneous drainage catheter placement on 06/07/2021 EXAM: CT ABDOMEN AND PELVIS WITH CONTRAST TECHNIQUE: Multidetector CT imaging of the abdomen and pelvis was performed using the standard protocol following bolus administration of intravenous contrast. CONTRAST:  72mL OMNIPAQUE IOHEXOL 350 MG/ML SOLN COMPARISON:  CT abdomen pelvis-06/19/2021; 06/06/2021; 09/13/2019; 09/05/2019 CT-guided per drainage catheter placement-06/07/2021; 09/14/2019 Drainage catheter injection and up sizing-08/22/2020 FINDINGS: Lower chest: Limited visualization of the lower thorax demonstrates minimal bibasilar subsegmental atelectasis/collapse, left greater than right. No discrete focal airspace opacities. No pleural effusion Normal heart size. Coronary artery calcifications. Calcifications involving the aortic valve leaflets. No pericardial effusion. Hepatobiliary: Normal hepatic contour. Subcentimeter hypoattenuating lesion within the dome of the left lobe of the liver (image 20, series 3), as well as the tiny (approximately 4 mm) hypoattenuating lesion within the caudal aspect of the right lobe of the liver (image 33, series 3), are both too small to accurately characterize though both unchanged compared to remote abdominal CT performed 08/2019. No discrete worrisome hepatic lesions. Normal appearance of the gallbladder given degree distention. No radiopaque gallstones. No intra or extrahepatic biliary dilatation. No ascites. Pancreas: Normal appearance of the pancreas. Spleen: Normal appearance of the spleen. Adrenals/Urinary Tract: There is symmetric enhancement and excretion of the bilateral kidneys. Subcentimeter hypoattenuating renal lesions are too small to adequately characterize though favored to represent renal cysts. No definite evidence of nephrolithiasis on  this postcontrast examination. No urine obstruction or perinephric stranding. Normal appearance of the bilateral adrenal glands. Mild diffuse thickening the urinary bladder wall, unchanged. Stomach/Bowel: Redemonstrated masslike thickening involving the cecum and proximal ascending colon. Moderate colonic stool burden without evidence of enteric obstruction. No pneumoperitoneum, pneumatosis or portal venous gas. Stable positioning of right lower abdominal quadrant percutaneous drainage catheter with near complete resolution of the right iliac CIS and iliopsoas intramuscular abscess with tiny component extending about the anterior aspect of the proximal femur measuring approximately 2.1 x 2.3 cm (image 83, series 3), though now appears predominantly phlegmonous. There is a small amount of free fluid within the pelvic cul-de-sac without peripheral wall enhancement to suggest definable/drainable fluid collection. Vascular/Lymphatic: Moderate amount of atherosclerotic plaque within a normal caliber abdominal aorta, not resulting in hemodynamically significant stenosis. The major branch vessels of the abdominal aorta appear patent on this non CTA examination. No definitive bulky retroperitoneal, mesenteric, pelvic or inguinal lymphadenopathy. Reproductive: Prostatomegaly with mass effect on the undersurface of the urinary bladder. Other: Mild diffuse body wall anasarca Musculoskeletal: No acute or aggressive osseous abnormalities. Mild multilevel lumbar spine DDD, worse at L2-L3 and L3-L4 with disc space height loss, endplate irregularity and sclerosis. Mild-to-moderate degenerative change the bilateral hips, right greater than left. Note is made of a right-sided os acetabuli. IMPRESSION: 1. Interval reduction/near resolution of right lower  abdominal/pelvic iliacus and iliopsoas musculature abscess with tiny component remaining about the proximal anterior aspect of the right femur measuring approximately 3.1 cm in  diameter though now appears predominantly phlegmonous. 2. Small amount of free fluid the pelvic cul-de-sac without new definable/drainable fluid collection within the abdomen or pelvis. 3. Masslike thickening involving the cecum and ascending colon, nonspecific though worrisome for malignancy. No evidence of enteric obstruction. Further evaluation with colonoscopy after the resolution of acute symptoms is advised. 4. Marked prostatomegaly with mass effect on the undersurface of the urinary bladder. If not recently performed, further evaluation with DRE is advised. 5.  Aortic Atherosclerosis (ICD10-I70.0). PLAN: - Recommend maintaining the drainage catheter to a JP bulb, continued b.i.d. flushing with 10 cc normal saline and diligent records regarding daily drainage catheter output - Assuming the patient's interval discharge, the patient may follow-up at the interventional radiology drain clinic next week (week of November 14) for IV only contrast CT of the abdomen and pelvis, evaluation and management and drainage catheter injection. Electronically Signed   By: Sandi Mariscal M.D.   On: 06/23/2021 08:43   IR Catheter Tube Change  Result Date: 06/22/2021 INDICATION: 80 year old with a right colonic mass and large retroperitoneal abscess with existing drain. Patient is having persistent fluid leakage around the drainage catheter. EXAM: 1. Drain injection 2. Drain exchange with up sizing MEDICATIONS: Local anesthetic, 1% lidocaine ANESTHESIA/SEDATION: None FLUOROSCOPY TIME:  48 seconds, 2 mGy CONTRAST:  15 mL Omnipaque 725 COMPLICATIONS: None immediate. PROCEDURE: Informed written consent was obtained from the patient after a thorough discussion of the procedural risks, benefits and alternatives. All questions were addressed. A timeout was performed prior to the initiation of the procedure. Patient was placed supine. The right lower abdomen and existing drain were prepped and draped in sterile fashion. Maximal barrier  sterile technique was utilized including caps, mask, sterile gowns, sterile gloves, sterile drape, hand hygiene and skin antiseptic. Contrast was injected through the drain. The drain was cut and removed over a Bentson wire. A new 14 French pigtail drain was easily advanced over the wire and placed within the large abscess cavity. Approximately 100 mL of yellow purulent fluid was aspirated from the collection. Gas and bloody fluid was aspirated after all of the yellow fluid was removed. Skin around the catheter was anesthetized with 1% lidocaine and the catheter was sutured to skin with Prolene suture. FINDINGS: Large residual abscess cavity in the right lower abdomen corresponding with the previous CT findings. Approximately 100 mL of predominantly yellow fluid was removed along with a large amount of gas. 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 IMPRESSION: 1. Successful exchange and up sizing of the right lower abdominal abscess drain. The patient now has a 14 [place] drain. Electronically Signed   By: Markus Daft M.D.   On: 06/22/2021 09:41    Labs:  CBC: Recent Labs    06/21/21 0539 06/22/21 0301 06/23/21 0136 06/24/21 0156  WBC 14.3* 14.6* 9.0 7.0  HGB 8.0* 8.1* 8.3* 8.0*  HCT 24.6* 25.5* 26.7* 25.6*  PLT 502* 476* 392 PLATELET CLUMPS NOTED ON SMEAR, UNABLE TO ESTIMATE    COAGS: Recent Labs    10/19/20 1350 06/19/21 1758  INR 1.1 1.4*  APTT 38*  --     BMP: Recent Labs    06/21/21 0539 06/22/21 0301 06/23/21 0136 06/24/21 0156  NA 131* 134* 133* 134*  K 3.3* 3.5 3.7 3.5  CL 98 101 101 102  CO2 25 25 24 27   GLUCOSE 105* 99 88 107*  BUN 8 6* 7* 6*  CALCIUM 7.7* 7.8* 7.7* 7.7*  CREATININE 0.63 0.63 0.60* 0.69  GFRNONAA >60 >60 >60 >60    LIVER FUNCTION TESTS: Recent Labs    06/13/21 0134 06/14/21 0539 06/19/21 1758 06/24/21 0156  BILITOT 0.4 0.6 1.1 0.2*  AST 42* 39 21 44*  ALT 38 40 21 33  ALKPHOS 77 73 69 52  PROT  6.0* 6.1* 6.0* 4.8*  ALBUMIN 2.0* 2.2* 2.1* 1.6*    Assessment and Plan:  RLQ abscess drain in place  Plan: Continue TID flushes with 5 cc NS. Record output Q shift. Dressing changes QD or PRN if soiled.  Call IR APP or on call IR MD if difficulty flushing or sudden change in drain output.  Repeat imaging/possible drain injection once output < 10 mL/QD (excluding flush material.)  Discharge planning: Please contact IR APP or on call IR MD prior to patient d/c to ensure appropriate follow up plans are in place. Typically patient will follow up with IR clinic 10-14 days post d/c for repeat imaging/possible drain injection. IR scheduler will contact patient with date/time of appointment. Patient will need to flush drain QD with 5 cc NS, record output QD, dressing changes every 2-3 days or earlier if soiled.   IR will continue to follow - please call with questions or concerns.  Electronically Signed: Murrell Redden, PA-C 06/24/2021, 11:48 AM    I spent a total of 15 Minutes at the the patient's bedside AND on the patient's hospital floor or unit, greater than 50% of which was counseling/coordinating care for drain follow up.

## 2021-06-24 NOTE — TOC Transition Note (Signed)
Transition of Care [facility]) - CM/SW Discharge Note   Patient Details  Name: Huber Mathers MRN: [id number] Date of Birth: 01/05/1941  Transition of [facility]) CM/SW Contact:  Emeterio Reeve, LCSW Phone Number: 06/24/2021, 12:27 PM   Clinical Narrative:      Per MD patient ready for DC to [facility]. RN, patient, and facility notified of DC. Discharge Summary and FL2 sent to facility. DC packet on chart. Insurance Josem Kaufmann has been received and pt is covid negative. Ambulance transport requested for patient.    RN to call report to [phone].  CSW will sign off for now as social work intervention is no longer needed. Please consult [place] again if new needs arise.   Final next level of care: Skilled Nursing Facility Barriers to Discharge: Barriers Resolved   Patient Goals and CMS Choice        Discharge Placement              Patient chooses bed at: [facility] Patient to be transferred to facility by: [facility] Name of family member notified: none Patient and family notified of of transfer: 06/24/21  Discharge Plan and Services                                     Social Determinants of Health (SDOH) Interventions     Readmission Risk Interventions No flowsheet data found.   Emeterio Reeve, LCSW Clinical Social Worker

## 2021-06-24 NOTE — Progress Notes (Signed)
Lab called with a positive COVID result. [name], MD and Miranda with social work notified.

## 2021-06-25 NOTE — Progress Notes (Signed)
Mobility Specialist Progress Note:   06/25/21 1050  Mobility  Activity Ambulated in room  Level of Assistance Minimal assist, patient does 75% or more  Assistive Device Front wheel walker  Distance Ambulated (ft) 30 ft  Mobility Ambulated with assistance in room  Mobility Response Tolerated fair  Mobility performed by Mobility specialist  $Mobility charge 1 Mobility   Pt displayed SOB during and after ambulation. Required minA to stand from EOB, contactG during amb. Bed alarm on and RN present.   Nelta Numbers Mobility Specialist  Phone [phone]

## 2021-06-25 NOTE — Consult Note (Signed)
   [facility] Inpatient Consult   06/25/2021  Jamarri Vuncannon 09-27-1940 [id number]  [place] [facility] [ACO] Patient: [employer] Medicare  Primary Care Provider:  Gladys Damme, MD,  [facility] Family Medicine, Embedded   Patient screened for less than 7 days readmission hospitalization with noted barriers for post hospital transition.  Review of patient's medical record reveals patient is for transition to a skilled nursing facility level of care.  Plan:  Continue to follow progress and disposition to assess for post hospital care management needs.  If patient transitions to a SNF level of care then needs will be met for [facility] from the hospital.  For questions contact:   Natividad Brood, RN BSN [facility] Liaison  [phone] business mobile phone Toll free office [phone]  Fax number: [phone] [email or web address]

## 2021-06-25 NOTE — Progress Notes (Addendum)
Family Medicine Teaching Service Daily Progress Note Intern Pager: [phone]  Patient name: Albert Love record number: [id number] Date of birth: 12/14/1940 Age: 80 y.o. 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  Past medical history significant for hypertension, microcytic anemia, osteopenia, hyperlipidemia, central retinal artery occlusion and Friday, inguinal hernia on the right, perforated gastric ulcer and subsequent pneumoperitoneum s/p omental Albert Love patch January 2021, intra-abdominal abscess s/p JP drain placed 10/24 2022 and repeat 11/9.  Abdominal infection sepsis, resolved  cecal mass Vital signs have been stable with no fevers.  His baseline.  Drain in place. Hiccups are stable.  -Augmentin 875 mg 4 9 more days -Baclofen 5 mg 3 times daily for hiccups -Tylenol 650 mg every 6 hours for fever as needed -Fentanyl every 2 hours as needed for severe pain -Weekly labs due to his stability  -drain care per IR instructions  Covid + Rapid and PCR test positive. Unable to go to SNF until quarantine period up. No symptoms of cough/shortness of breath. Saturating well on room air -Monitor clinically -SNF when appropriate quarantine period done -No specific treatments warranted currently  Goals of care Spoke with son today and discussed -Palliative consulted,appreciate assistance  Enlarged Prostate UOP 1 ml/kg/day. -tamsulosin 0.4 mg daily  FEN/GI: Regular PPx: SCDs Dispo:SNF pending Covid quarantine   Subjective:  No complaints this morning, spoke about dispo with patient  Objective: Temp:   [97.5 F (36.4 C)-98.6 F (37 C)] 98 F (36.7 C) (11/11 0756) Pulse Rate:  [69-86] 85 (11/11 0756) Resp:  [16-20] 18 (11/11 0756) BP: (130-152)/(78-98) 152/98 (11/11 0756) SpO2:  [98 %-100 %] 98 % (11/11 0756) Physical Exam: General: NAD, laying down Cardiovascular: RRR no m/r/g Respiratory: CTAB no w/r/c Abdomen: Non-distended mildly tender to palpation near drain, drain in place with yellow liquid in bulb Extremities: no LE edema  Laboratory: Recent Labs  Lab 06/22/21 0301 06/23/21 0136 06/24/21 0156  WBC 14.6* 9.0 7.0  HGB 8.1* 8.3* 8.0*  HCT 25.5* 26.7* 25.6*  PLT 476* 392 PLATELET CLUMPS NOTED ON SMEAR, UNABLE TO ESTIMATE   Recent Labs  Lab 06/19/21 1758 06/20/21 0638 06/22/21 0301 06/23/21 0136 06/24/21 0156  NA 132*   < > 134* 133* 134*  K 4.2   < > 3.5 3.7 3.5  CL 97*   < > 101 101 102  CO2 25   < > 25 24 27   BUN 6*   < > 6* 7* 6*  CREATININE 0.66   < > 0.63 0.60* 0.69  CALCIUM 8.3*   < > 7.8* 7.7* 7.7*  PROT 6.0*  --   --   --  4.8*  BILITOT 1.1  --   --   --  0.2*  ALKPHOS 69  --   --   --  52  ALT 21  --   --   --  33  AST 21  --   --   --  44*  GLUCOSE 98   < > 99 88 107*   < > = values in this interval not displayed.   Imaging/Diagnostic Tests:  Albert Heck, MD 06/25/2021, 8:09 AM PGY-1, [place]  [place] Intern pager: [phone], text pages welcome

## 2021-06-25 NOTE — TOC Progression Note (Signed)
Transition of Care [facility]) - Progression Note    Patient Details  Name: Albert Love MRN: [id number] Date of Birth: 08/27/1940  [facility] of Care [facility]) CM/SW Contact  Emeterio Reeve, [place] Phone Number: 06/25/2021, 12:47 PM  Clinical Narrative:     CSW faxed pt out to [facility], they only facility accepting covid positive pts at this time. [facility] declined due to not having enough PPE.   CSW will continue to follow.     Barriers to Discharge: Barriers Resolved  Expected Discharge Plan and Services                                                 Social Determinants of Health (SDOH) Interventions    Readmission Risk Interventions No flowsheet data found.  Emeterio Reeve, LCSW Clinical Social Worker

## 2021-06-25 NOTE — Progress Notes (Signed)
FPTS Brief Progress Note  S:Patient laying comfortably in bed, no complaints. Nursing appreciates no concerns   O: BP (!) 148/88 (BP Location: Right Arm)   Pulse 69   Temp 97.9 F (36.6 C) (Oral)   Resp 18   Ht 5\' 11"  (1.803 m)   Wt 61.7 kg   SpO2 99%   BMI 18.97 kg/m     A/P: Abdominal infection (sepsis resolved)  abdominal pain, resolved  cecal mass Continue plans per day team - Orders reviewed. Labs for AM not ordered, which was adjusted as needed.    Holley Bouche, MD 06/25/2021, 3:20 AM PGY-1, [facility] Family Medicine Night Resident  Please page [phone] with questions.

## 2021-06-25 NOTE — Progress Notes (Signed)
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  Pt had JP drain in place on arrival since 10/24 and still has drain. COVID positive 11/10.    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    PT Comments    Pt admitted with above diagnosis. Pt was able to ambulate with RW with min to min guard assist.  No LOB but continues to be limited by endurance and some pain on right side where drain is in place. Also pt now with COVID as well.  Will progress pt as able.  Pt currently with functional limitations due to balance and endurance deficits. Pt will benefit from skilled PT to increase their independence and safety with mobility to allow discharge to the venue listed below.      Recommendations for follow up therapy are one component of a multi-disciplinary discharge planning process, led by the attending physician.  Recommendations may be updated based on patient status, additional functional criteria and insurance authorization.  Follow Up Recommendations  Skilled nursing-short term rehab (<3 hours/day)     Assistance Recommended at Discharge Frequent or constant Supervision/Assistance  Equipment Recommendations  Rolling walker (2 wheels);Hospital bed    Recommendations for Other Services       Precautions / Restrictions Precautions Precautions: Fall Precaution Comments: JP drain right, COVID precautions - positive on 11/10 Restrictions Weight  Bearing Restrictions: No     Mobility  Bed Mobility Overal bed mobility: Needs Assistance Bed Mobility: Supine to Sit Rolling: Min assist Sidelying to sit: Min assist Supine to sit: Min assist   Sit to sidelying: Min assist General bed mobility comments: verbal cues for technique and asssit for trunk    Transfers Overall transfer level: Needs assistance Equipment used: Rolling walker (2 wheels) Transfers: Sit to/from Stand;Bed to chair/wheelchair/BSC Sit to Stand: Min assist Stand pivot transfers: Min assist         General transfer comment: min assist to power up with cues for hand placement    Ambulation/Gait Ambulation/Gait assistance: Min guard;Min assist Gait Distance (Feet): 65 Feet Assistive device: Rolling walker (2 wheels) Gait Pattern/deviations: Step-through pattern;Decreased stride length;Trunk flexed   Gait velocity interpretation: <1.31 ft/sec, indicative of household ambulator   General Gait Details: Slow but steady gait with RW.  Cued pt to stand tall as he bends trunk forward slightly which he states is unusual for him.   Stairs             Wheelchair Mobility    Modified Rankin (Stroke Patients Only)       Balance Overall balance assessment: Needs assistance Sitting-balance support: No upper extremity supported;Feet supported Sitting balance-Leahy Scale: Fair Sitting balance - Comments: able to perform reaching tasks from eob   Standing balance support: Bilateral upper extremity supported;During functional activity Standing balance-Leahy Scale: Poor Standing balance comment: Needs UE and external support                            Cognition Arousal/Alertness: Awake/alert Behavior During Therapy: WFL for tasks assessed/performed Overall  Cognitive Status: Within Functional Limits for tasks assessed                                 General Comments: patient able to follow commands and oriented         Exercises General Exercises - Lower Extremity Ankle Circles/Pumps: AROM;Both;10 reps;Seated Long Arc Quad: AROM;Both;10 reps;Seated Hip Flexion/Marching: AROM;Left;10 reps;Seated    General Comments        Pertinent Vitals/Pain Pain Assessment: Faces Faces Pain Scale: Hurts little more Pain Location: R hip/leg with movement (at drain) Pain Descriptors / Indicators: Aching;Sore Pain Intervention(s): Limited activity within patient's tolerance;Monitored during session;Repositioned    Home Living                          Prior Function            PT Goals (current goals can now be found in the care plan section) Acute Rehab PT Goals Patient Stated Goal: wants to be able to walk to the bathroom Progress towards PT goals: Progressing toward goals    Frequency    Min 2X/week      PT Plan Current plan remains appropriate    Co-evaluation              AM-PAC PT "6 Clicks" Mobility   Outcome Measure  Help needed turning from your back to your side while in a flat bed without using bedrails?: A Little Help needed moving from lying on your back to sitting on the side of a flat bed without using bedrails?: A Little Help needed moving to and from a bed to a chair (including a wheelchair)?: A Lot Help needed standing up from a chair using your arms (e.g., wheelchair or bedside chair)?: A Lot Help needed to walk in hospital room?: A Little Help needed climbing 3-5 steps with a railing? : Total 6 Click Score: 14    End of Session Equipment Utilized During Treatment: Gait belt Activity Tolerance: Patient limited by fatigue Patient left: with call bell/phone within reach;in bed;with bed alarm set;with SCD's reapplied Nurse Communication: Mobility status PT Visit Diagnosis: Muscle weakness (generalized) (M62.81);Difficulty in walking, not elsewhere classified (R26.2)     Time: 6767-2094 PT Time Calculation (min) (ACUTE ONLY): 13 min  Charges:  $Gait  Training: 8-22 mins                     Laelia Angelo M,PT Acute Rehab Services [phone] (pager)    [name] 06/25/2021, 2:59 PM

## 2021-06-26 NOTE — Progress Notes (Signed)
Family Medicine Teaching Service Daily Progress Note Intern Pager: [phone]  Patient name: Albert Love record number: [id number] Date of birth: 01-13-1941 Age: 80 y.o. 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  Past medical history significant for hypertension, microcytic anemia, osteopenia, hyperlipidemia, central retinal artery occlusion and  inguinal hernia on the right, perforated gastric ulcer and subsequent pneumoperitoneum s/p omental [name] patch January 2021, intra-abdominal abscess s/p JP drain placed 10/24 2022 and repeat 11/9.   Abdominal infection sepsis, resolved  cecal mass Vital signs stable, patient stable and awaiting discharge -Continue Augmentin 875 mg fore 8 more days -Baclofen 5 mg TID for hiccups -Tylenol 650 mg q 6 hours prn for fevers -Fentanyl q 2 hours prn for sever pain -Weekly labs, patient stable -Drain care per IR instructions  Goals of Care -palliative care consulted, appreciate recommendations  Enlarged Prostate UOP was 1525 yesterday Continue tamsulosin 0.4 mg daily   FEN/GI: Regular PPx: SCDs Dispo:SNF  Pending covid quarantine .   Subjective:  Patient laying comfortably in bed, having eaten breakfast  Objective: Temp:  [98.2 F (36.8 C)-98.5 F (36.9 C)] 98.5 F (36.9 C) (11/12 0753) Pulse Rate:  [74-100] 84 (11/12 0753) Resp:  [16-18] 16 (11/12 0753) BP: (125-139)/(77-90) 139/86 (11/12 0753) SpO2:  [91 %-100 %] 100 % (11/12 0753) Physical Exam: General: Elderly, frail, African American male, NASD Cardiovascular: RRR, NMRG Respiratory:  CTABL Abdomen: Soft, NTTP, non-distended Extremities: Moving all extremities independently   Laboratory: Recent Labs  Lab 06/22/21 0301 06/23/21 0136 06/24/21 0156  WBC 14.6* 9.0 7.0  HGB 8.1* 8.3* 8.0*  HCT 25.5* 26.7* 25.6*  PLT 476* 392 PLATELET CLUMPS NOTED ON SMEAR, UNABLE TO ESTIMATE   Recent Labs  Lab 06/19/21 1758 06/20/21 0638 06/22/21 0301 06/23/21 0136 06/24/21 0156  NA 132*   < > 134* 133* 134*  K 4.2   < > 3.5 3.7 3.5  CL 97*   < > 101 101 102  CO2 25   < > 25 24 27   BUN 6*   < > 6* 7* 6*  CREATININE 0.66   < > 0.63 0.60* 0.69  CALCIUM 8.3*   < > 7.8* 7.7* 7.7*  PROT 6.0*  --   --   --  4.8*  BILITOT 1.1  --   --   --  0.2*  ALKPHOS 69  --   --   --  52  ALT 21  --   --   --  33  AST 21  --   --   --  44*  GLUCOSE 98   < > 99 88 107*   < > = values in this interval not displayed.      Imaging/Diagnostic Tests:   Holley Bouche, MD 06/26/2021, 9:57 AM PGY-1, [place] Intern pager: [phone], text pages welcome

## 2021-06-26 NOTE — Progress Notes (Signed)
FPTS Brief Note Reviewed patient's vitals, recent notes.  Vitals:   06/25/21 1457 06/25/21 1922  BP: 125/77 133/80  Pulse: 92 100  Resp: 18 18  Temp: 98.4 F (36.9 C) 98.2 F (36.8 C)  SpO2: 97% 91%   At this time, no change in plan from day progress note.  Ezequiel Essex, MD Page [phone] with questions about this patient.

## 2021-06-27 NOTE — Progress Notes (Signed)
FPTS Brief Note Reviewed patient's vitals, recent notes.  Vitals:   06/27/21 1813 06/27/21 2132  BP: (!) 150/95 (!) 147/88  Pulse: 82 83  Resp: 18 18  Temp: 99 F (37.2 C)   SpO2: 100% 99%   At this time, no change in plan from day progress note.  Ezequiel Essex, MD Page [phone] with questions about this patient.

## 2021-06-27 NOTE — Progress Notes (Addendum)
Family Medicine Teaching Service Daily Progress Note Intern Pager: [phone]  Patient name: Albert Love record number: [id number] Date of birth: 1940/09/21 Age: 80 y.o. Gender: male  Primary Care Provider: Gladys Damme, MD Consultants: GI, IR, General Surgery, Palliative Medicine Code Status: DNR  Pt Overview and Major Events to Date:  11/5- Admitted for sepsis with intra-abdominal abscess/cecal tumor 11/8- IR replaced/upsized RLQ drain 11/10- COVID +  Assessment and Plan: Albert Love is a 80 year-old male admitted with sepsis (resolved) and intra-abdominal abscess with known cecal mass, incidentally found to be COVID positive awaiting SNF placement after quarantine.  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 Afebrile overnight. At baseline. Drain in place without signs of infection, ~75 cc purulent fluid in bulb. Hiccups stable. - Continue Augmentin 875mg , day #6 (11/8- 06/25/18) - Baclofen TID for hiccups - Tylenol PRN for fever - Fentanyl q2h PRN for severe pain - Weekly labs - Drain care per IR instructions  COVID + Positive by Rapid and PCR on 11/10. Quarantining here until return to SNF. Asymptomatic, non-vaccinated. - Monitor symptoms - No treatment currently warranted  Enlarged prostate Urinating appropriately. 24h UOP 1.2L (0.9 ml/kg/hr). - Continue Tamsulosin 0.4mg  daily  Goals of care Palliative medicine following.  FEN/GI: Regular PPx: SCDs Dispo:SNF pending COVID completion of quarantine period  Subjective:  Pt feels well and has no complaints. He is eating and drinking normally. He had questions about antibiotic use after d/c. Was asking about when he will finish quarantine period for COVID.  Objective: Temp:  [97.9 F (36.6 C)-99 F (37.2 C)] 99 F (37.2 C) (11/13 0850) Pulse Rate:  [82-100] 100 (11/13 0850) Resp:  [14-17] 14 (11/13 0850) BP: (132-154)/(69-93) 132/81 (11/13 0850) SpO2:  [99  %-100 %] 99 % (11/13 0850) Physical Exam: General: Pleasant, in no distress Cardiovascular: RRR Respiratory: CTAB, normal work of breathing on room air Abdomen: Soft, non-tender. RLQ drain in place draining ~75 cc purulent fluid, dry dressing intact over drain insertion site Extremities: Warm, dry, well-perfused, no edema  Laboratory: Recent Labs  Lab 06/22/21 0301 06/23/21 0136 06/24/21 0156  WBC 14.6* 9.0 7.0  HGB 8.1* 8.3* 8.0*  HCT 25.5* 26.7* 25.6*  PLT 476* 392 PLATELET CLUMPS NOTED ON SMEAR, UNABLE TO ESTIMATE   Recent Labs  Lab 06/22/21 0301 06/23/21 0136 06/24/21 0156  NA 134* 133* 134*  K 3.5 3.7 3.5  CL 101 101 102  CO2 25 24 27   BUN 6* 7* 6*  CREATININE 0.63 0.60* 0.69  CALCIUM 7.8* 7.7* 7.7*  PROT  --   --  4.8*  BILITOT  --   --  0.2*  ALKPHOS  --   --  52  ALT  --   --  33  AST  --   --  44*  GLUCOSE 99 88 107*    Imaging/Diagnostic Tests: No results found.   Albert Brill, DO 06/27/2021, 9:33 AM PGY-1, [place] Intern pager: [phone], text pages welcome

## 2021-06-27 NOTE — Progress Notes (Signed)
FPTS Brief Note Reviewed patient's vitals, recent notes.  Vitals:   06/26/21 1655 06/26/21 2047  BP: 132/76 (!) 142/69  Pulse: 84 82  Resp: 16 17  Temp: 98.7 F (37.1 C) 98.9 F (37.2 C)  SpO2: 100% 100%   At this time, no change in plan from day progress note.   Eulis Foster, MD Page [phone] with questions about this patient.

## 2021-06-28 NOTE — Progress Notes (Signed)
Nutrition Follow-up  DOCUMENTATION CODES:  Severe malnutrition in context of chronic illness  INTERVENTION:  Continue Ensure BID.  Continue MVI with minerals daily.  Continue regular diet.  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 - ongoing  GOAL:  Patient will meet greater than or equal to 90% of their needs - progressing  MONITOR:  PO intake, Supplement acceptance, I & O's, Labs  REASON FOR ASSESSMENT:  Consult Assessment of nutrition requirement/status  ASSESSMENT:  80 y.o. male presented to the ED with fatigue, weakness, fever, abdominal pain, and increase output of right JP drain. Pt known to service from most recent admission on 10/22-11/1. PMH includes colonic mass, HTN, abdominal abscess, and malnutrition. Pt admitted with sepsis due to intra-abdominal infection. 11/5 - admitted for sepsis with intra abdominal abscess and cecal tumor 11/8 - IR replaced/upsized RLQ drain 11/10 - found to be COVID+   Pt to remain in hospital while quarantining for discharge to SNF.  Per Epic, pt eating an average of 72% of the last 8 meals (0-100%). Taking Ensure shakes consistently.  Pt due for new weight. RD to order.  Continue current nutrition plan.  Supplements: Ensure BID  Medications: reviewed; Augmentin PO BID, MVI with minerals  Labs: reviewed; Na 134 (L), Glucose 107 (H)  Diet Order:   Diet Order             Diet regular Room service appropriate? Yes; Fluid consistency: Thin  Diet effective now                  EDUCATION NEEDS:  No education needs have been identified at this time  Skin:  Skin Assessment: Reviewed RN Assessment  Last BM:  06/20/2021  Height:  Ht Readings from Last 1 Encounters:  06/19/21 5\' 11"  (1.803 m)   Weight:  Wt Readings from Last 1 Encounters:  06/19/21 61.7 kg   BMI:  Body mass index is 18.97 kg/m.  Estimated  Nutritional Needs:  Kcal:  1900-2100 Protein:  95-110 grams Fluid:  > 1.9 L  Derrel Nip, RD, LDN (she/her/hers) Clinical Inpatient Dietitian RD Pager/After-Hours/Weekend Pager # in [place]

## 2021-06-28 NOTE — Progress Notes (Signed)
FPTS Brief Note Reviewed patient's vitals, recent notes.  Vitals:   06/28/21 1717 06/28/21 2041  BP: (!) 152/92 139/89  Pulse: 83 90  Resp: 16 16  Temp: 98.3 F (36.8 C) 98.7 F (37.1 C)  SpO2: 100% 100%   Currently day #4 s/p first COVID (+) test result.   At this time, no change in plan from day progress note.  Ezequiel Essex, MD Page [phone] with questions about this patient.

## 2021-06-28 NOTE — Progress Notes (Signed)
Family Medicine Teaching Service Daily Progress Note Intern Pager: [phone]  Patient name: Albert Love record number: [id number] Date of birth: 1941/04/02 Age: 80 y.o. Gender: male  Primary Care Provider: Gladys Damme, MD Consultants: GI, IR, General Surgery, Palliative Medicine Code Status: DNR  Pt Overview and Major Events to Date:  11/5- Admitted for sepsis with intra-abdominal abscess/cecal tumor 11/8- IR replaced/upsized RLQ drain 11/10- COVID PCR +  Assessment and Plan: Olyver Love is a 80 year-old male admitted with now resolved sepsis and intra-abdominal abscess with known cecal mass, incidentally found to be COVID positive awaiting SNF placement after quarantine period ends.  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 Afebrile. At baseline. Abdomen soft, RLQ drain with ~50cc purulent fluid. No signs of infection near drain site. - Continue Augmentin 875mg , day #7 (11/8- 11/19) - Baclofen TID for hiccups - Tylenol PRN for fever - D/c Fentanyl q2h PRN - Continue oxycodone 5mg  q4h PRN for severe pain - Weekly labs - Drain care per IR instructioins  COVID + COVID PCR + on 11/10. Must quarantine here until SNF placement (10 days total). Only had loose stools, no other symptoms. - Monitor symptoms - No treatment warranted  Enlarged prostate Urinating without difficulty. 24h UOP 1.35L (0.9 ml/kg/hr) - Continue Tamsulosin 0.4mg  daily  Goals of care/housing Need to discuss with pt about safe long-term disposition (?family in [place] or [place]). Lives alone in apartment here in [place] and given poor prognosis, he will eventually require more assistance at home. Nephew Albert Love is local contact, but has proven to be unreliable (which patient also says). - Palliative medicine following  FEN/GI: Regular PPx: SCDs Dispo:SNF pending COVID quarantine period completion   Subjective:  [name] feels well. He ordered a  biscuit with bacon for breakfast and got other food which he wasn't happy about, but he completed all of his breakfast. Drinking and voiding normally. Able to get out of bed.  Objective: Temp:  [97.8 F (36.6 C)-99 F (37.2 C)] 97.8 F (36.6 C) (11/14 0749) Pulse Rate:  [68-83] 83 (11/14 0749) Resp:  [16-18] 16 (11/14 0749) BP: (133-150)/(86-95) 144/94 (11/14 0749) SpO2:  [94 %-100 %] 100 % (11/14 0749) Physical Exam: General: Pleasant, in no distress Cardiovascular: RRR Respiratory: CTAB, normal work of breathing on room air Abdomen: Soft, non-tender. RLQ drain in place with ~50cc purulent fluid, dry dressing intact over drain site Extremities: Warm, dry, well-perfused, no edema  Laboratory: Recent Labs  Lab 06/22/21 0301 06/23/21 0136 06/24/21 0156  WBC 14.6* 9.0 7.0  HGB 8.1* 8.3* 8.0*  HCT 25.5* 26.7* 25.6*  PLT 476* 392 PLATELET CLUMPS NOTED ON SMEAR, UNABLE TO ESTIMATE   Recent Labs  Lab 06/22/21 0301 06/23/21 0136 06/24/21 0156  NA 134* 133* 134*  K 3.5 3.7 3.5  CL 101 101 102  CO2 25 24 27   BUN 6* 7* 6*  CREATININE 0.63 0.60* 0.69  CALCIUM 7.8* 7.7* 7.7*  PROT  --   --  4.8*  BILITOT  --   --  0.2*  ALKPHOS  --   --  52  ALT  --   --  33  AST  --   --  44*  GLUCOSE 99 88 107*    Coral Timme, Luna Fuse, DO 06/28/2021, 9:43 AM PGY-1, [place] Intern pager: [phone], text pages welcome

## 2021-06-29 ENCOUNTER — Other Ambulatory Visit (HOSPITAL_COMMUNITY): Payer: Self-pay

## 2021-06-29 DIAGNOSIS — K6389 Other specified diseases of intestine: Secondary | ICD-10-CM | POA: Diagnosis not present

## 2021-06-29 DIAGNOSIS — K651 Peritoneal abscess: Secondary | ICD-10-CM | POA: Diagnosis not present

## 2021-06-29 DIAGNOSIS — R627 Adult failure to thrive: Secondary | ICD-10-CM | POA: Diagnosis not present

## 2021-06-29 DIAGNOSIS — Z515 Encounter for palliative care: Secondary | ICD-10-CM | POA: Diagnosis not present

## 2021-06-29 MED ORDER — ENSURE ENLIVE PO LIQD
237.0000 mL | Freq: Two times a day (BID) | ORAL | 12 refills | Status: DC
  Filled 2021-06-29: qty 237, 1d supply, fill #0

## 2021-06-29 MED ORDER — OXYCODONE HCL 5 MG PO TABS
5.0000 mg | ORAL_TABLET | Freq: Four times a day (QID) | ORAL | 0 refills | Status: AC | PRN
  Filled 2021-06-29: qty 28, 7d supply, fill #0

## 2021-06-29 MED ORDER — BACLOFEN 5 MG PO TABS
5.0000 mg | ORAL_TABLET | Freq: Two times a day (BID) | ORAL | 0 refills | Status: DC
  Filled 2021-06-29: qty 30, 15d supply, fill #0

## 2021-06-29 MED ORDER — OXYCODONE HCL 5 MG PO TABS
5.0000 mg | ORAL_TABLET | Freq: Three times a day (TID) | ORAL | 0 refills | Status: DC | PRN
  Filled 2021-06-29: qty 21, 7d supply, fill #0

## 2021-06-29 MED ORDER — AMOXICILLIN-POT CLAVULANATE 875-125 MG PO TABS
1.0000 | ORAL_TABLET | Freq: Two times a day (BID) | ORAL | 0 refills | Status: AC
  Filled 2021-06-29: qty 8, 4d supply, fill #0

## 2021-06-29 NOTE — Progress Notes (Signed)
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  Pt had JP drain in place on arrival since 10/24 and still has drain. COVID positive 11/10.    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    PT Comments    Pt admitted with above diagnosis. Pt was able to ambulate in room with min guard assist with RW. Fatigues quickly but does not lose balance.  Has poor endurance overall. Will continue PT.  Pt currently with functional limitations due to balance and endurance deficits. Pt will benefit from skilled PT to increase their independence and safety with mobility to allow discharge to the venue listed below.      Recommendations for follow up therapy are one component of a multi-disciplinary discharge planning process, led by the attending physician.  Recommendations may be updated based on patient status, additional functional criteria and insurance authorization.  Follow Up Recommendations  Skilled nursing-short term rehab (<3 hours/day)     Assistance Recommended at Discharge Frequent or constant Supervision/Assistance  Equipment Recommendations  Rolling walker (2 wheels);Hospital bed    Recommendations for Other Services       Precautions / Restrictions Precautions Precautions: Fall Precaution Comments: JP drain right, COVID precautions - positive on 11/10 Restrictions Weight Bearing Restrictions: No     Mobility  Bed Mobility Overal bed  mobility: Needs Assistance Bed Mobility: Supine to Sit Rolling: Min guard Sidelying to sit: Min guard Supine to sit: Min guard     General bed mobility comments: No assist needed    Transfers Overall transfer level: Needs assistance Equipment used: Rolling walker (2 wheels) Transfers: Sit to/from Stand;Bed to chair/wheelchair/BSC Sit to Stand: Min assist           General transfer comment: min assist to power up with cues for hand placement    Ambulation/Gait Ambulation/Gait assistance: Min guard Gait Distance (Feet): 70 Feet Assistive device: Rolling walker (2 wheels) Gait Pattern/deviations: Step-through pattern;Decreased stride length;Trunk flexed   Gait velocity interpretation: <1.31 ft/sec, indicative of household ambulator   General Gait Details: Slow but steady gait with RW.  Cued pt to stand tall as he bends trunk forward slightly which he states is unusual for him. He bends forward greater as he fatigues.   Stairs             Wheelchair Mobility    Modified Rankin (Stroke Patients Only)       Balance Overall balance assessment: Needs assistance Sitting-balance support: No upper extremity supported;Feet supported Sitting balance-Leahy Scale: Fair Sitting balance - Comments: able to perform reaching tasks from eob   Standing balance support: Bilateral upper extremity supported;During functional activity Standing balance-Leahy Scale: Poor Standing balance comment: Needs UE and external support                            Cognition Arousal/Alertness: Awake/alert Behavior During Therapy: WFL for tasks assessed/performed Overall Cognitive Status: Within Functional Limits for tasks assessed  General Comments: patient able to follow commands and oriented        Exercises General Exercises - Lower Extremity Ankle Circles/Pumps: AROM;Both;10 reps;Seated Long Arc Quad: AROM;Both;10  reps;Seated Hip Flexion/Marching: AROM;Left;10 reps;Seated    General Comments        Pertinent Vitals/Pain Pain Assessment: Faces Faces Pain Scale: Hurts little more Pain Location: R hip/leg with movement (at drain) Pain Descriptors / Indicators: Aching;Sore Pain Intervention(s): Limited activity within patient's tolerance;Monitored during session;Repositioned    Home Living                          Prior Function            PT Goals (current goals can now be found in the care plan section) Acute Rehab PT Goals Patient Stated Goal: wants to be able to walk to the bathroom Progress towards PT goals: Progressing toward goals    Frequency    Min 2X/week      PT Plan Current plan remains appropriate    Co-evaluation              AM-PAC PT "6 Clicks" Mobility   Outcome Measure  Help needed turning from your back to your side while in a flat bed without using bedrails?: A Little Help needed moving from lying on your back to sitting on the side of a flat bed without using bedrails?: A Little Help needed moving to and from a bed to a chair (including a wheelchair)?: A Lot Help needed standing up from a chair using your arms (e.g., wheelchair or bedside chair)?: A Lot Help needed to walk in hospital room?: A Little Help needed climbing 3-5 steps with a railing? : Total 6 Click Score: 14    End of Session Equipment Utilized During Treatment: Gait belt Activity Tolerance: Patient limited by fatigue Patient left: with call bell/phone within reach;in bed;with bed alarm set Nurse Communication: Mobility status PT Visit Diagnosis: Muscle weakness (generalized) (M62.81);Difficulty in walking, not elsewhere classified (R26.2)     Time: 1601-0932 PT Time Calculation (min) (ACUTE ONLY): 15 min  Charges:  $Gait Training: 8-22 mins                     Raeana Blinn M,PT Acute Rehab Services [phone] (pager)    [name] 06/29/2021, 10:24  AM

## 2021-06-29 NOTE — TOC Progression Note (Signed)
Transition of Care [facility]) - Progression Note    Patient Details  Name: Albert Love MRN: [id number] Date of Birth: August 28, 1940  Transition of Care [facility]) CM/SW Contact  Emeterio Reeve, [place] Phone Number: 06/29/2021, 3:27 PM  Clinical Narrative:     CSW spoke to pt on phone about DC options. Before CSW could explain, pt stated he wants to go home with HHPT. Pt stated he has been getting up on his own to go to the bathroom and feels that he could do it at his apartment on his own. Pt requested HHPT. CSW informed MD and NCM.     Barriers to Discharge: Barriers Resolved  Expected Discharge Plan and Services                                                 Social Determinants of Health (SDOH) Interventions    Readmission Risk Interventions No flowsheet data found.  Emeterio Reeve, LCSW Clinical Social Worker

## 2021-06-29 NOTE — Progress Notes (Signed)
FPTS Brief Note Reviewed patient's vitals, recent notes.  Vitals:   06/29/21 0812 06/29/21 2024  BP: (!) 142/89 (!) 139/92  Pulse: 72 86  Resp: 16 18  Temp: 98.2 F (36.8 C) 98.3 F (36.8 C)  SpO2: 100% 100%   At this time, no change in plan from day progress note.  Ezequiel Essex, MD Page [phone] with questions about this patient.

## 2021-06-29 NOTE — Care Management (Signed)
    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

## 2021-06-29 NOTE — TOC Initial Note (Signed)
Transition of Care [facility]) - Initial/Assessment Note    Patient Details  Name: Albert Love MRN: [id number] Date of Birth: 01-20-1941  Transition of Care [facility]) CM/SW Contact:    [name], RN Phone Number: 06/29/2021, 3:47 PM  Clinical Narrative:                  Spoke to patient at bedside. Patient has decided to go home with home health instead of SNF. Patient lives alone address: [address] [place], [place] ( [address] [place]). Patient lives alone and understands home health will not make daily visits, HHPT 2 to 3 times a week for 1 hour, and HHRN will not be there daily to flush the drain. Bedside nurse will teach him and a family member drain care prior to discharge. Patient voiced understanding.   PT recommended walker and hospital bed. Per Dr [name] patient can discharge to home prior to hospital bed being delivered. Patient in agreement. NCM ordered DME with [name] with [facility]. [employer] will bring walker to bedside and then call patient to arrange delivery of hospital bed.   Patient consented for NCM to call [name] . NCM called and explained above  to [name] voiced understanding. When patient ready for discharge bedside nurse to call [name] and he will come pick patient up.   [name] has accepted referral for home health    Bedside nurse [name] aware of all of above and will call [name] when ready to discharge patient and will provide drain care teaching to patient and [name]  Expected Discharge Plan: [name] Barriers to Discharge: No Barriers Identified   Patient Goals and CMS Choice Patient states their goals for this hospitalization and ongoing recovery are:: to go home CMS Medicare.gov Compare Post Acute Care list provided to:: Patient Choice offered to / list presented to : Patient  Expected Discharge Plan and Services Expected Discharge Plan: [name]   Discharge Planning Services: CM Consult Post  Acute Care Choice: Home Health, Durable Medical Equipment                   DME Arranged: Walker rolling, Hospital bed DME Agency: [employer] Date DME Agency Contacted: 06/29/21 Time DME Agency Contacted: [id number] Representative spoke with at DME Agency: [name] HH Arranged: RN, PT, OT, Nurse's Aide [name] Agency: [name] Date [place]: 06/29/21 Time [place]: 19 Representative spoke with at [place] Arrangements/Services   Lives with:: Self Patient language and need for interpreter reviewed:: Yes Do you feel safe going back to the place where you live?: Yes      Need for Family Participation in Patient Care: Yes (Comment) Care giver support system in place?: Yes (comment)   Criminal Activity/Legal Involvement Pertinent to Current Situation/Hospitalization: No - Comment as needed  Activities of Daily Living      Permission Sought/Granted   Permission granted to share information with : No              Emotional Assessment Appearance:: Appears stated age Attitude/Demeanor/Rapport: Engaged Affect (typically observed): Accepting Orientation: : Oriented to Self, Oriented to Place, Oriented to  Time, Oriented to Situation Alcohol / Substance Use: Not Applicable Psych Involvement: No (comment)  Admission diagnosis:  Intra-abdominal abscess (HCC) [K65.1] Failure to thrive in adult [R62.7] Colonic mass [K63.89] Sepsis ([place]) [A41.9] Sepsis, due to unspecified organism, unspecified whether acute organ dysfunction present ([place]) [A41.9]  Patient Active Problem List   Diagnosis Date Noted   Intra-abdominal abscess ([place]) 06/19/2021   Leukocytosis 06/19/2021   Sepsis ([place]) 06/19/2021   Hospice care patient 06/15/2021   Palliative care patient 06/15/2021   Constipation    Failure to thrive in adult    Abscess    Protein-calorie malnutrition, severe 06/07/2021   Colonic mass 06/06/2021   Anemia 01/21/2021   Inguinal  hernia of right side without obstruction or gangrene 01/21/2021   H. 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   Pneumoperitoneum 09/05/2019   Acute renal insufficiency 09/05/2019   Perforated gastric ulcer s/p omental [name] patch 09/05/2019 09/05/2019   Coagulopathy ([place]) 09/05/2019   PCP:  Gladys Damme, MD Pharmacy:   [facility] [place], [place] [address] [place] [address] Phone: [phone] Fax: [phone]  [facility] [address] [place] [address] Phone: [phone] Fax: [phone]     Social Determinants of Health (SDOH) Interventions    Readmission Risk Interventions No flowsheet data found.

## 2021-06-29 NOTE — Progress Notes (Addendum)
Discussed case with case manager and noted that patient was interested in attempting to discharge home today.  Patient did have walker at bedside and medications ordered to bedside.  Discussed with case manager and subsequently with the patient's nurse that is always the patient is comfortable performing his drain flushes that we can discharge today.  Greatly appreciate case manager going above and beyond to assist this patient and appreciate his nurse with teaching him with regard to his drain. I went to confirm discharge with the patient and ensure that he was comfortable with this and the patient stated that he did not have a ride today as his nephew Albert Love would not be able to get him until tomorrow and he felt more comfortable going home tomorrow when Albert Love could assist him rather than this evening.  We will plan for discharge tomorrow.  I did call and speak to [place] after getting the okay to do so from the patient and he states he will be able to pick up the patient tomorrow if the patient is appropriate for discharge at that time.

## 2021-06-29 NOTE — Progress Notes (Signed)
Patient ID: Albert Love, male   DOB: 1941-04-29, 79 y.o.   MRN: [id number]    Progress Note from the Palliative Medicine Team at [facility]   Patient Name: Albert Love        Date: 06/29/2021 DOB: Nov 21, 1940  Age: 80 y.o. MRN#: [id number] Attending Physician: Lind Covert, MD Primary Care Physician: Gladys Damme, MD Admit Date: 06/19/2021   Medical records reviewed, discussed with treatment team.  This NP visited patient at the bedside as a follow up for palliative medicine needs and emotional support.   Education offered on the seriousness of his current medical situation and his high risk for decompensation secondary to his likely malignant cecal mass and infection.  There has been conversation regarding hospice on past admissions and this nurse practitioner revisited and educated on hospice benefit and eligibility.  Patient verbalizes adamantly "I am not ready for hospice".  He hopes to be discharged home with home health and he tells me today that he will hire out-of-pocket caregivers to assist him in his home.  Education offered on outpatient community-based palliative services, Albert Love is open to this.  Discussed with patient the importance of continued conversation with his support persons and his medical providers regarding overall plan of care and treatment options,  ensuring decisions are within the context of the patients values and GOCs.  Questions and concerns addressed   Discussed with Dr Lonna Cobb  Total time spent on the unit was 35 minutes  The above conversation was completed via telephone 2/2 to COVID-19 status . Thorough chart review and discussion with necessary members of the care team was completed as part of assessment. All issues were discussed and addressed but no physical exam was performed.   Greater than 50% of the time was spent in counseling and coordination of care  Wadie Lessen NP  Palliative Medicine Team Team Phone # [phone] Pager [phone]

## 2021-06-29 NOTE — Progress Notes (Signed)
Mobility Specialist Progress Note:   06/29/21 1230  Mobility  Activity Ambulated in room  Level of Assistance Standby assist, set-up cues, supervision of patient - no hands on  Assistive Device Front wheel walker  Distance Ambulated (ft) 40 ft  Mobility Ambulated independently in room  Mobility Response Tolerated well  Mobility performed by Mobility specialist  $Mobility charge 1 Mobility   Pt asx during ambulation. Stated he has been going to BR independently.   Nelta Numbers Mobility Specialist  Phone [phone]

## 2021-06-29 NOTE — Progress Notes (Signed)
Family Medicine Teaching Service Daily Progress Note Intern Pager: [phone]  Patient name: Albert Love record number: [id number] Date of birth: [birth year] Age: 80 y.o. Gender: male  Primary Care Provider: Gladys Damme, MD Consultants: GI, IR, General Surgery, Palliative Medicine Code Status: DNR  Pt Overview and Major Events to Date:  11/5- Admitted for sepsis with intra-abdominal abscess/cecal tumor 11/8- IR replaced/upsized RLQ drain 11/10- COVID PCR+  Assessment and Plan: Albert Love is a 80 year old male admitted with now resolved sepsis and intra-abdominal abscess with known cecal mass, incidentally found to be COVID-positive awaiting placement/disposition.    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  No signs of new or worsening infection.  Patient is desiring second opinion, will schedule appointment with [facility] general surgery for potential tissue biopsy. -Continue Augmentin 8 or 75 mg, day #8 (11/8-06/2018) -Baclofen 3 times daily for hiccups -Tylenol as needed for fever, mild pain -Oxycodone 5 mg every 4 hours as needed for severe pain -Weekly labs -Drain care per IR instructions  Goals of care/disposition Patient ambulates well using walker, has self-awareness when moving out of the bed.  PT/OT recommends SNF, but patient is adamant that he does not want to return to a SNF.  Palliative medicine met with patient this morning and it seems that he is not agreeable to hospice/pursuing hospice.  He had the assumption that hospice would come and help with light household duties, and that hospice is "the place to go to die."  Initial goal was home with home health and hospice, but best disposition at this point may be home with home health PT/OT and an aide, although this would only be temporary.  Unknown if patient fully understands prognosis, may need to have further  conversations.  Support system is not reliable in the area, his nephew Albert Love intermittently checks in on the patient and is involved in care. -Appreciate palliative medicine -Discuss safest disposition with the patient -Appropriate home DME orders placed-rolling walker, bedside toilet  COVID-positive COVID PCR positive on 11/10.  For SNF placement, patient would have 5 more days of quarantine -Monitor symptoms -No treatment warranted  Enlarged prostate Urinating without difficulty -Continue tamsulosin 0.4 mg daily  FEN/GI: Regular PPx: SCDs Dispo: SNF?  Home with PT/OT/aide?  Pending further discussion  Subjective:  Albert Love feels well this morning.  He was able to get up walk around the room without pain or difficulty.  He says his appetite is "great" and he is eating and drinking normally.  Objective: Temp:  [98.2 F (36.8 C)-98.7 F (37.1 C)] 98.2 F (36.8 C) (11/15 0812) Pulse Rate:  [64-90] 72 (11/15 0812) Resp:  [16-18] 16 (11/15 0812) BP: (139-162)/(89-102) 142/89 (11/15 0812) SpO2:  [100 %] 100 % (11/15 1540) Physical Exam: General: Pleasant, cachectic, in no distress Cardiovascular: RRR Respiratory: CTAB, normal work of breathing on room air Abdomen: Soft, nontender.  RLQ drain in place with scant purulent fluid, dry dressing intact over drain site.   Extremities:, Dry, well perfused, no edema.  Thin  Laboratory: Recent Labs  Lab 06/23/21 0136 06/24/21 0156  WBC 9.0 7.0  HGB 8.3* 8.0*  HCT 26.7* 25.6*  PLT 392 PLATELET CLUMPS NOTED ON SMEAR, UNABLE TO ESTIMATE   Recent Labs  Lab 06/23/21 0136 06/24/21 0156  NA 133* 134*  K 3.7 3.5  CL 101 102  CO2 24 27  BUN 7* 6*  CREATININE 0.60* 0.69  CALCIUM 7.7* 7.7*  PROT  --  4.8*  BILITOT  --  0.2*  ALKPHOS  --  52  ALT  --  33  AST  --  44*  GLUCOSE 88 107*     Arianna Delsanto, Luna Fuse, DO 06/29/2021, 9:53 AM PGY-1, [place] Intern pager: [phone], text pages welcome

## 2021-06-30 ENCOUNTER — Other Ambulatory Visit: Payer: Medicare Other

## 2021-06-30 ENCOUNTER — Inpatient Hospital Stay
Admission: RE | Admit: 2021-06-30 | Discharge: 2021-06-30 | Disposition: A | Discharge status: Home / Self Care | Payer: Medicare Other | Source: Ambulatory Visit | Attending: Physician Assistant | Admitting: Physician Assistant

## 2021-06-30 NOTE — Discharge Summary (Signed)
[facility] Discharge Summary  Patient name: Albert Love record number: [id number] Date of birth: 08-09-1941 Age: 80 y.o. Gender: male Date of Admission: 06/19/2021  Date of Discharge: 06/30/21 Admitting Physician: Gladys Damme, MD  Primary Care Provider: Gladys Damme, MD Consultants: GI, IR, Surgery, Palliative  Indication for Hospitalization: Sepsis w/ intra-abdominal abscess/cecal tumor  Discharge Diagnoses/Problem List:  Principal Problem:   Intra-abdominal abscess [facility]) Active Problems:   Hypertension   Anemia   Colonic mass   Protein-calorie malnutrition, severe   Leukocytosis   Sepsis ([place])    Disposition: Home with home health PT  Discharge Condition: Stable  Discharge Exam:  Blood pressure (!) 162/96, pulse 81, temperature 99.2 F (37.3 C), temperature source Oral, resp. rate 18, height 5' 11"  (1.803 m), weight 61.7 kg, SpO2 100 %.  Gen: well-appearing, NAD CV: RRR, no murmurs auscultated Pulm: CTAB, no increased work of breathing Abdomen: soft, nontender, bowel sounds present in all quadrants, drain present with minimal white drainage  Brief Hospital Course: Albert Love is a 80 year old male presented with sepsis of intra-abdominal source secondary to cecal tumor.  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 His medical course is below.  Sepsis due to intra-abdominal infection  retroperitoneal abscess  abdominal pain  cecal fistula / mass Patient admitted October 22 - November 1 and found to have inoperable cecal mass that perforated into retroperitoneum. He underwent IR drain  placement 06/07/21 into right sided retroperitoneal fluid collection. Patient discharged home on 06/15/21.  Patient presented 06/19/21 with fever, increasing right-sided abdominal pain, and increasing purulent drainage into right-sided JP drain.  He met sepsis criteria with fever, tachycardia to 120s, leukocytosis to 12.4 with left shift given ANC of 10.  CT scan showed fistula communicating with cecum. Source of infection was intra-abdominal given CT findings and purulent JP drainage.  CXR with no disease process. In the ED, patient received 2L NS and was started on zosyn, after blood cultures were obtained. Surgery was consulted and determined that the mass was still inoperable. IR was consulted and decided to exchange the drain on 06/22/21. 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 Also showed marked prostatomegaly with mass effect on urinary bladder. GI was consulted for diagnostic colonoscopy of the unresectable mass.The risk of performing colonoscopy is quite high and would be purely a diagnostic procedure and not therapeutic, the risks outweigh the benefits per GI. Depending on clinical course and GOC, could revisit colonoscopy in a few weeks after resolution of abscess and drain removed for oncology follow up.  Hiccups Patient presented with hiccups for last 2-3 weeks that were aggravating abdominal pain. During admission, patient was treated with Zofran, Reglan, and Thorazine, but continued to have hiccups. Patient later started on Baclofen 5 mg TID and appeared to have resolution of symptoms.   Electrolyte abnormalities Patient presented with Hyponatremia to 132, hypochloremia to 97 in the setting of dehydration. Electrolyte disturbance was resolved with gentle maintenance IVF. While admitted, patient K was noted to be 3.3, and was repleted with  K supplement.   Macrocytic anemia and thrombocytosis Patient presented with a Hgb of 9.9  with a Plt of 517. Hgb and platelets were stable during admission, patient didn't require a transfusion. His transfusion threshold was 7.   Enlarged prostate Urine output 1 ml/kg/hr only charted on second shift. CTA showed marked prostatomegaly with mass effect on the undersurface of the urinary bladder. Started on tamsulosin 0.4 mg at discharge. PSA was 9.9, free pSA 0.87 and free % 8.8.  [place] Patient following with Palliative Care outpatient. He signed a MOST form agreeing to DNR/DNI, limited use of anx/IVF and no feeding tube. Last hospitalization patient decided  he would go home with hospice and his nephew Albert Love had agreed to care for him, who later had a change of heart. Albert Love therefore went to [place] for rehabilitation with the idea of transition to hospice thereafter though he returned septic w/in a week. He did not qualify for any hospice facilities due to antibiotics use. He returned back to SNF with palliative following outpatient.  COVID Positive COVID PCR positive on 11/10.  Mostly asymptomatic, only had diarrhea although unknown if this was related to COVID.  Patient is unvaccinated against COVID   Issues for Follow Up:  Obtain second opinion from surgery outpatient as patietn was evaluated by surgery in same group  Recommend maintaining the drainage catheter to a JP bulb, continued b.i.d. flushing with 10 cc normal saline and diligent records regarding daily drainage catheter output Follow-up at the interventional radiology drain clinic next week (week of November 14) for IV only contrast CT of the abdomen and pelvis, evaluation and management and drainage catheter injection. F/u GI outpatient if colonoscopy wanted for tissue biopsy/to be able to follow with oncology Consider urology outpatient follow up for prostate enlargement. PSA was elevated    Significant Procedures: Drain  placed  Significant Labs and Imaging:  Recent Labs  Lab 06/24/21 0156  WBC 7.0  HGB 8.0*  HCT 25.6*  PLT PLATELET CLUMPS NOTED ON SMEAR, UNABLE TO ESTIMATE   Recent Labs  Lab 06/24/21 0156  NA 134*  K 3.5  CL 102  CO2 27  GLUCOSE 107*  BUN 6*  CREATININE 0.69  CALCIUM 7.7*  ALKPHOS 52  AST 44*  ALT 33  ALBUMIN 1.6*    Results/Tests Pending at Time of Discharge: None  Discharge Medications:  Allergies as of 06/30/2021   No Known Allergies      Medication List     STOP taking these medications    aspirin 81 MG chewable tablet   metoCLOPramide 10 MG tablet Commonly known as: REGLAN       TAKE these medications    amoxicillin-clavulanate 875-125 MG tablet Commonly known as: AUGMENTIN Take 1 tablet by mouth every 12 (twelve) hours for 4 days.   atorvastatin 80 MG tablet Commonly known as: LIPITOR Take 80 mg by mouth daily.   Baclofen 5 MG Tabs Take 5 mg by mouth 2 (two) times daily.   famotidine 10 MG tablet Commonly known as: PEPCID Take 1 tablet (10 mg total) by mouth daily.   feeding supplement Liqd Take 237 mLs by mouth 3 (three) times daily between meals. What changed: Another medication with the same name was added. Make sure you understand how and when to take each.   feeding supplement Liqd Take 237 mLs by mouth 2 (two) times daily between meals. What changed: You were already taking a medication with the same name, and this prescription was added. Make sure you understand how and when to take each.   ferrous sulfate 325 (65 FE)  MG EC tablet Take 1 tablet (325 mg total) by mouth daily with breakfast.   melatonin 5 MG Tabs Take 1 tablet (5 mg total) by mouth at bedtime.   multivitamin with minerals Tabs tablet Take 1 tablet by mouth daily.   oxyCODONE 5 MG immediate release tablet Commonly known as: Oxy IR/ROXICODONE Take 1 tablet (5 mg total) by mouth 4 (four) times daily as needed for up to 7 days for moderate pain. What  changed:  medication strength how much to take when to take this reasons to take this   tamsulosin 0.4 MG Caps capsule Commonly known as: FLOMAX Take 1 capsule (0.4 mg total) by mouth daily.               Durable Medical Equipment  (From admission, onward)           Start     Ordered   06/29/21 1536  For home use only DME Walker rolling  Once       Question Answer Comment  Walker: With [place] Wheels   Patient needs a walker to treat with the following condition Weakness      06/29/21 1535   06/29/21 1535  For home use only DME Hospital bed  Once       Question Answer Comment  Length of Need Lifetime   Patient has (list medical condition): Sepsis (resolved) secondary to intra-abdominal right lower quadrant abscess, likely malignant cecal mass   The above medical condition requires: Patient requires the ability to reposition frequently   Head must be elevated greater than: 45 degrees   Bed type Semi-electric   Support Surface: Gel Overlay      06/29/21 1535   06/29/21 1151  For home use only DME Walker rolling  Once       Question Answer Comment  Walker: With [place]   Patient needs a walker to treat with the following condition Unsteady gait      06/29/21 1150            Discharge Instructions: Please refer to Patient Instructions section of EMR for full details.  Patient was counseled important signs and symptoms that should prompt return to medical care, changes in medications, dietary instructions, activity restrictions, and follow up appointments.   Follow-Up Appointments:  Follow-up Information     Care, [facility] Follow up.   Specialty: Home Health Services Contact information: [address] [place] [address] [phone]         [facility] Follow up.   Why: Company walker and hospital bed was ordered through Contact information: [place] [address] [phone]          [facility]. Go on 07/05/2021.   Why: At 2:15pm. Please arrive by 2 pm. This is your hospital follow up appointment with your family medicine clinic.        [facility] Follow up.   Contact information: [place] [address] [place], DO 06/30/2021, 12:30 PM PGY-1, [place]

## 2021-06-30 NOTE — TOC Progression Note (Signed)
Transition of Care [facility]) - Progression Note    Patient Details  Name: Albert Love MRN: [id number] Date of Birth: Jan 10, 1941  Transition of Care [facility]) CM/SW Contact  Jacalyn Lefevre Edson Snowball, RN Phone Number: 06/30/2021, 10:54 AM  Clinical Narrative:     See TOC note from yesterday.   Patient discharging to home , agreeable to palliative services at home. Referral for Palliative Care given to and accepted by Chrislynn with [facility]   Expected Discharge Plan: [place] Barriers to Discharge: No Barriers Identified  Expected Discharge Plan and Services Expected Discharge Plan: [place]   Discharge Planning Services: CM Consult Post Acute Care Choice: Home Health, Durable Medical Equipment                   DME Arranged: Walker rolling, Hospital bed DME Agency: [employer] Date DME Agency Contacted: 06/29/21 Time DME Agency Contacted: [id number] Representative spoke with at DME Agency: [name] HH Arranged: RN, PT, OT, Nurse's Aide [place] Agency: [place] Date [place]: 06/29/21 Time [place]: [id number] Representative spoke with at [place]: [place] ([place]) Interventions    Readmission Risk Interventions No flowsheet data found.

## 2021-06-30 NOTE — Progress Notes (Signed)
Mobility Specialist Progress Note:   06/30/21 1030  Mobility  Activity Ambulated in room  Level of Assistance Contact guard assist, steadying assist  Assistive Device Front wheel walker  Distance Ambulated (ft) 40 ft  Mobility Ambulated with assistance in room  Mobility Response Tolerated fair  Mobility performed by Mobility specialist  $Mobility charge 1 Mobility   Pt c/o dizziness upon standing, yet VSS. Second attempt with success. Asx during ambulation. States he has been amb to BR independently, d/c today.   Nelta Numbers Mobility Specialist  Phone [phone]

## 2021-06-30 NOTE — Progress Notes (Signed)
Patient educated on how to change JP drain dsg and how to empty the drain and he expressed understanding of the teaching. We continue to monitor.

## 2021-06-30 NOTE — Progress Notes (Signed)
Pt discharged to home via wheelchair. Pt alert/oriented and compliant with discharge.  Pt has supplies for JP drain care and medications filled from pharmacy- baclofen, amoxicillin and oxycodone IR.  IV removed by previous shift.

## 2021-06-30 NOTE — Discharge Instructions (Addendum)
Dear Albert Love,   Thank you for letting us participate in your care! In this section, you will find a brief hospital admission summary of why you were admitted to the hospital, what happened during your admission, your diagnosis/diagnoses, and recommended follow up.  You were diagnosed with sepsis secondary to your cecal mass. You were treated with antibiotics and had a drain placed.  You were also seen by GI, interventional radiology, surgery, and palliative medicine. Your sepsis improved and you were discharged from the hospital for meeting this goal.    [place] second opinion from surgery outpatient if that is what you want to seek out Recommend maintaining the drainage catheter to a JP bulb, continued twice daily flushing with 10 cc normal saline and diligent records regarding daily drainage catheter output Follow-up at the interventional radiology drain clinic next week (week of November 14) for IV only contrast CT of the abdomen and pelvis, evaluation and management and drainage catheter injection. F/u GI outpatient if colonoscopy wanted for tissue biopsy/to be able to follow with oncology Consider urology outpatient follow up for prostate enlargement. PSA was elevated.  6.   Please let PCP/Specialists know of any changes in medications that were made.  7.   Please see medications section of this packet for any medication changes.   DOCTOR'S APPOINTMENTS & FOLLOW UP Future Appointments  Date Time Provider [place]  07/05/2021  2:30 PM Eulis Foster, MD [facility] [place]     Thank you for choosing [facility]! Take care and be well!  [facility]  [place], [place] [address] [phone]

## 2021-07-01 ENCOUNTER — Telehealth: Payer: Self-pay | Admitting: *Deleted

## 2021-07-01 ENCOUNTER — Other Ambulatory Visit: Payer: Self-pay

## 2021-07-01 ENCOUNTER — Other Ambulatory Visit: Payer: Self-pay | Admitting: Family Medicine

## 2021-07-01 DIAGNOSIS — K6389 Other specified diseases of intestine: Secondary | ICD-10-CM

## 2021-07-01 DIAGNOSIS — Z0000 Encounter for general adult medical examination without abnormal findings: Secondary | ICD-10-CM

## 2021-07-01 DIAGNOSIS — R627 Adult failure to thrive: Secondary | ICD-10-CM

## 2021-07-01 DIAGNOSIS — A419 Sepsis, unspecified organism: Secondary | ICD-10-CM

## 2021-07-01 DIAGNOSIS — E43 Unspecified severe protein-calorie malnutrition: Secondary | ICD-10-CM

## 2021-07-01 NOTE — Chronic Care Management (AMB) (Signed)
  Care Management   Outreach Note  07/01/2021 Name: Conlin Brahm MRN: [id number] DOB: [birth year]  Referred by: Gladys Damme, MD Reason for referral : Care Coordination (Initial outreach to schedule referral with [facility])   An unsuccessful telephone outreach was attempted today. The patient was referred to the case management team for assistance with care management and care coordination.   Follow Up Plan:  A HIPAA compliant phone message was left for the patient providing contact information and requesting a return call.  The care management team will reach out to the patient again over the next 7 days. If patient returns call to provider office, please advise to call [place] at [phone].  [place] Management  Direct Dial: [phone]

## 2021-07-01 NOTE — Patient Outreach (Signed)
[facility]) Care Management  07/01/2021  Ilya Neely 1940-10-23 [id number]   Follow up:  Change in disposition  Patient to be referred to Embedded Chronic Care Management team for support and follow up  Primary Care Provider:  Gladys Damme, MD, [facility]  Home Health:  [facility]  Palliative Care:  [facility] was referred  Patient declined SNF,patient has minimal support, Failure to Thrive, sepsis noted  Referral request for post hospital CCM follow up after TOC.  Natividad Brood, RN BSN [facility] Liaison  [phone] business mobile phone Toll free office [phone]  Fax number: [phone] [email or web address]

## 2021-07-02 ENCOUNTER — Telehealth: Payer: Self-pay

## 2021-07-02 NOTE — Telephone Encounter (Signed)
Albert Love with [facility] calls nurse line stating they have picked patient up for palliative care.   LVM with Albert Love confirming Dr. Chauncey Reading is PCP.

## 2021-07-05 ENCOUNTER — Inpatient Hospital Stay: Payer: Medicare Other | Admitting: Family Medicine

## 2021-07-05 ENCOUNTER — Telehealth: Payer: Self-pay

## 2021-07-05 NOTE — Progress Notes (Deleted)
    SUBJECTIVE:   CHIEF COMPLAINT / HPI: hospital f/u for Cecal mass, s/p COVID +   Patient presents today for follow up after hospitalization for cecal mass. He reports that he has been feeling ***   PERTINENT  PMH / PSH: ***  OBJECTIVE:   There were no vitals taken for this visit.  ***  ASSESSMENT/PLAN:   No problem-specific Assessment & Plan notes found for this encounter.   Recommended for second opnion for cecal mass and he was determined to be inoperative while hospitalized.   Eulis Foster, MD [place]

## 2021-07-05 NOTE — Telephone Encounter (Signed)
Attempted to contact patient to schedule a Palliative Care consult appointment. No answer and unable to leave a message voicemail is full.  

## 2021-07-07 ENCOUNTER — Encounter: Payer: Self-pay | Admitting: *Deleted

## 2021-07-07 NOTE — Progress Notes (Signed)
Opened in error

## 2021-07-07 NOTE — Chronic Care Management (AMB) (Signed)
  Care Management   Outreach Note  07/07/2021 Name: Albert Love MRN: [id number] DOB: Dec 19, 1940  Referred by: Gladys Damme, MD Reason for referral : Care Coordination (Initial outreach to schedule referral with [facility])   A second unsuccessful telephone outreach was attempted today. The patient was referred to the case management team for assistance with care management and care coordination.   Follow Up Plan:  The care management team will reach out to the patient again over the next 7 days.  If patient returns call to provider office, please advise to call [place]* at [phone].*  [place] Management  Direct Dial: [phone]

## 2021-07-09 ENCOUNTER — Emergency Department (HOSPITAL_COMMUNITY)
Admission: EM | Admit: 2021-07-09 | Discharge: 2021-07-09 | Disposition: A | Discharge status: Home / Self Care | Payer: Medicare Other | Attending: Emergency Medicine | Admitting: Emergency Medicine

## 2021-07-09 ENCOUNTER — Encounter (HOSPITAL_COMMUNITY): Payer: Self-pay

## 2021-07-09 ENCOUNTER — Emergency Department (HOSPITAL_COMMUNITY): Payer: Medicare Other

## 2021-07-09 ENCOUNTER — Other Ambulatory Visit: Payer: Self-pay

## 2021-07-09 DIAGNOSIS — F17210 Nicotine dependence, cigarettes, uncomplicated: Secondary | ICD-10-CM | POA: Diagnosis not present

## 2021-07-09 DIAGNOSIS — R1031 Right lower quadrant pain: Secondary | ICD-10-CM | POA: Diagnosis not present

## 2021-07-09 DIAGNOSIS — K7689 Other specified diseases of liver: Secondary | ICD-10-CM | POA: Diagnosis not present

## 2021-07-09 DIAGNOSIS — I10 Essential (primary) hypertension: Secondary | ICD-10-CM | POA: Diagnosis not present

## 2021-07-09 DIAGNOSIS — T85698A Other mechanical complication of other specified internal prosthetic devices, implants and grafts, initial encounter: Secondary | ICD-10-CM | POA: Insufficient documentation

## 2021-07-09 DIAGNOSIS — Y848 Other medical procedures as the cause of abnormal reaction of the patient, or of later complication, without mention of misadventure at the time of the procedure: Secondary | ICD-10-CM | POA: Diagnosis not present

## 2021-07-09 DIAGNOSIS — Z79899 Other long term (current) drug therapy: Secondary | ICD-10-CM | POA: Insufficient documentation

## 2021-07-09 DIAGNOSIS — R109 Unspecified abdominal pain: Secondary | ICD-10-CM | POA: Diagnosis not present

## 2021-07-09 DIAGNOSIS — K651 Peritoneal abscess: Secondary | ICD-10-CM | POA: Diagnosis not present

## 2021-07-09 DIAGNOSIS — Z743 Need for continuous supervision: Secondary | ICD-10-CM | POA: Diagnosis not present

## 2021-07-09 DIAGNOSIS — K630 Abscess of intestine: Secondary | ICD-10-CM | POA: Diagnosis not present

## 2021-07-09 DIAGNOSIS — N281 Cyst of kidney, acquired: Secondary | ICD-10-CM | POA: Diagnosis not present

## 2021-07-09 DIAGNOSIS — L02211 Cutaneous abscess of abdominal wall: Secondary | ICD-10-CM | POA: Diagnosis not present

## 2021-07-09 LAB — COMPREHENSIVE METABOLIC PANEL
ALT: 22 U/L (ref 0–44)
AST: 22 U/L (ref 15–41)
Albumin: 2.4 g/dL — ABNORMAL LOW (ref 3.5–5.0)
Alkaline Phosphatase: 66 U/L (ref 38–126)
Anion gap: 7 (ref 5–15)
BUN: 6 mg/dL — ABNORMAL LOW (ref 8–23)
CO2: 25 mmol/L (ref 22–32)
Calcium: 8.8 mg/dL — ABNORMAL LOW (ref 8.9–10.3)
Chloride: 104 mmol/L (ref 98–111)
Creatinine, Ser: 0.67 mg/dL (ref 0.61–1.24)
GFR, Estimated: >60 mL/min (ref >60)
Glucose, Bld: 96 mg/dL (ref 70–99)
Potassium: 4.2 mmol/L (ref 3.5–5.1)
Sodium: 136 mmol/L (ref 135–145)
Total Bilirubin: 0.7 mg/dL (ref 0.3–1.2)
Total Protein: 5.8 g/dL — ABNORMAL LOW (ref 6.5–8.1)

## 2021-07-09 LAB — CBC WITH DIFFERENTIAL/PLATELET
Abs Immature Granulocytes: 0 10*3/uL (ref 0.00–0.07)
Basophils Absolute: 0.1 10*3/uL (ref 0.0–0.1)
Basophils Relative: 2 %
Eosinophils Absolute: 0.4 10*3/uL (ref 0.0–0.5)
Eosinophils Relative: 6 %
HCT: 31.3 % — ABNORMAL LOW (ref 39.0–52.0)
Hemoglobin: 9.5 g/dL — ABNORMAL LOW (ref 13.0–17.0)
Lymphocytes Relative: 10 %
Lymphs Abs: 0.7 10*3/uL (ref 0.7–4.0)
MCH: 22.1 pg — ABNORMAL LOW (ref 26.0–34.0)
MCHC: 30.4 g/dL (ref 30.0–36.0)
MCV: 72.8 fL — ABNORMAL LOW (ref 80.0–100.0)
Monocytes Absolute: 0.3 10*3/uL (ref 0.1–1.0)
Monocytes Relative: 5 %
Neutro Abs: 5 10*3/uL (ref 1.7–7.7)
Neutrophils Relative %: 77 %
Platelets: 279 10*3/uL (ref 150–400)
RBC: 4.3 MIL/uL (ref 4.22–5.81)
RDW: 26.9 % — ABNORMAL HIGH (ref 11.5–15.5)
WBC: 6.5 10*3/uL (ref 4.0–10.5)
nRBC: 0 % (ref 0.0–0.2)
nRBC: 0 /100 WBC

## 2021-07-09 IMAGING — CT CT ABD-PELV W/ CM
2 of 5 series · 16 of 46 positions shown, 18 images · IV contrast (Omni 300)
Comparison: [DATE]

CLINICAL DATA: Abdominal abscess/infected suspected, fluid leaking
around drain placed at site of prior RIGHT iliacus abscess

EXAM:
CT ABDOMEN AND PELVIS WITH CONTRAST
TECHNIQUE: Multidetector CT imaging of the abdomen and pelvis was performed
using the standard protocol following bolus administration of
intravenous contrast.
CONTRAST:  100mL OMNIPAQUE IOHEXOL 300 MG/ML  SOLN IV

[Series 3: a/p w/ 5mm · axial · 0.80mm/px · z∈[+842,+1237]mm · 13 of 89 slices shown, 15 images]
[im 5/89  soft-tissue]
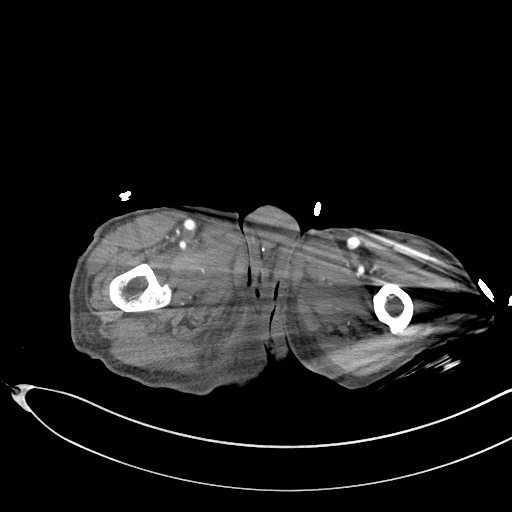
[im 5/89  bone]
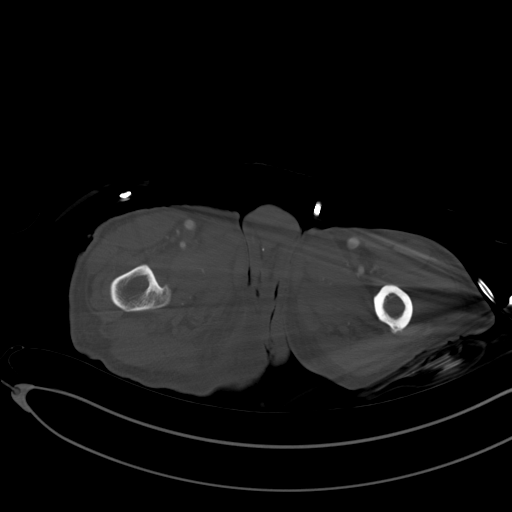
[im 14/89  soft-tissue]
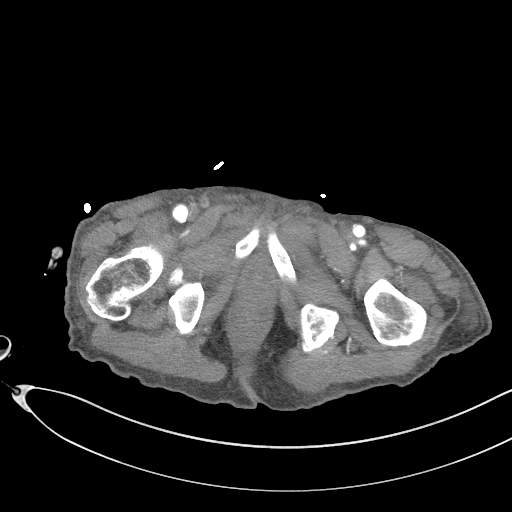
[im 19/89  soft-tissue]
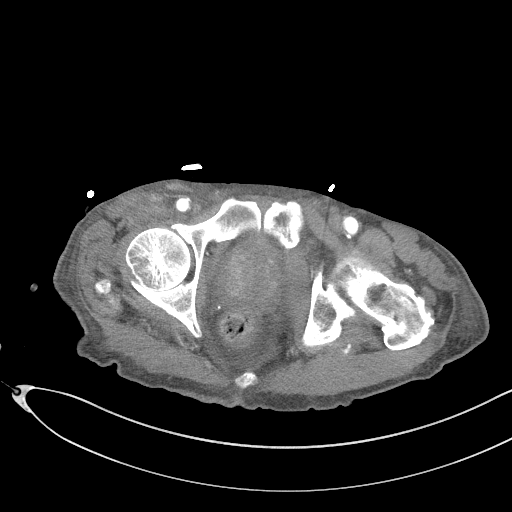
[im 24/89  soft-tissue]
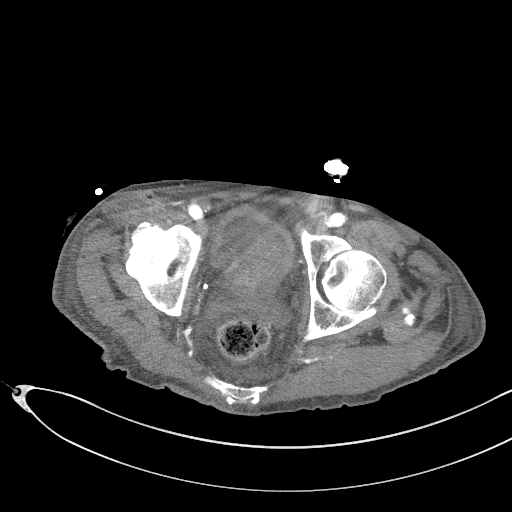
[im 33/89  soft-tissue]
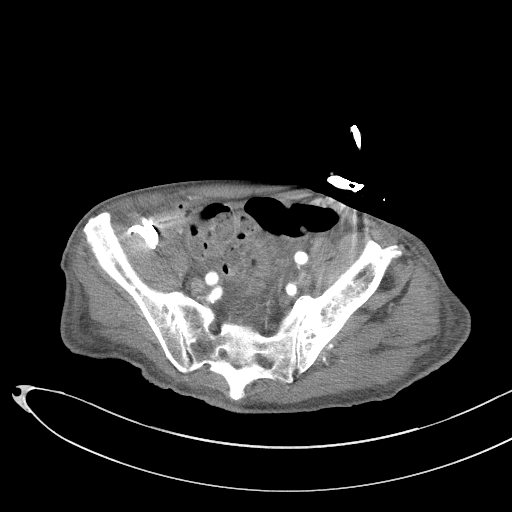
[im 38/89  soft-tissue]
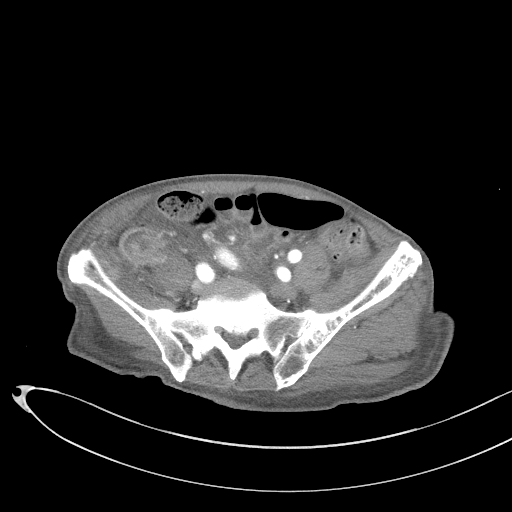
[im 47/89  soft-tissue]
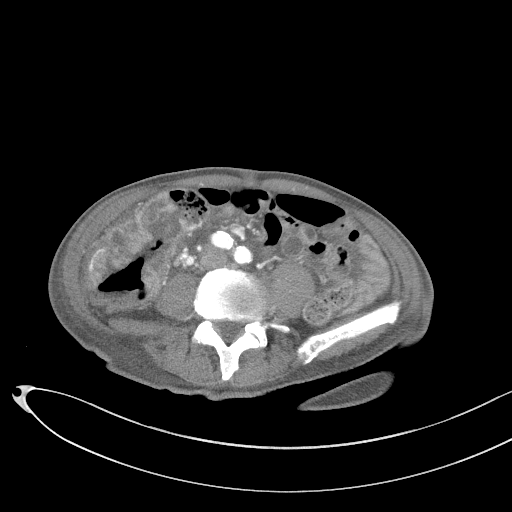
[im 51/89  soft-tissue]
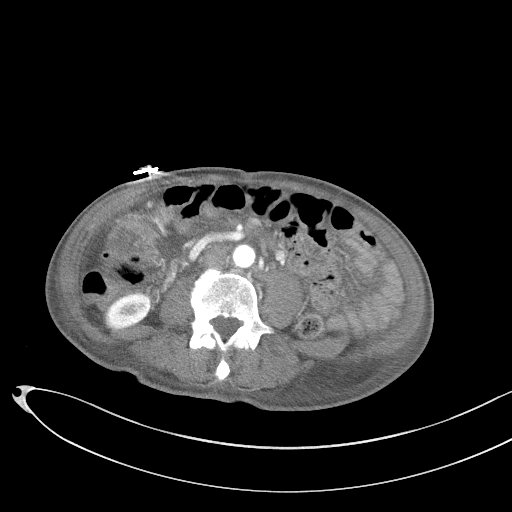
[im 56/89  soft-tissue]
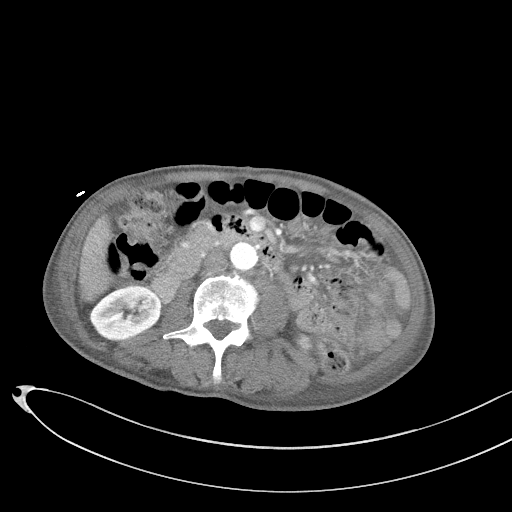
[im 56/89  bone]
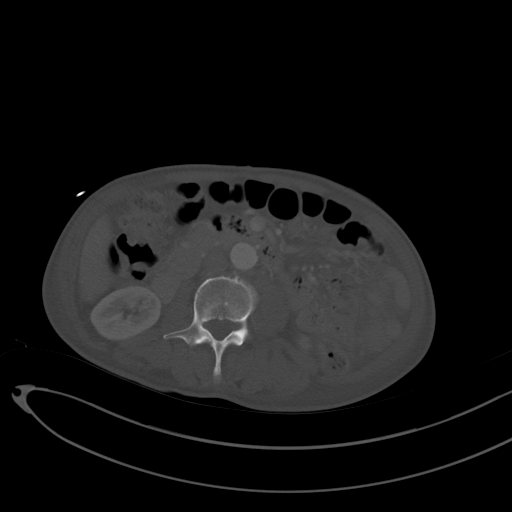
[im 65/89  soft-tissue]
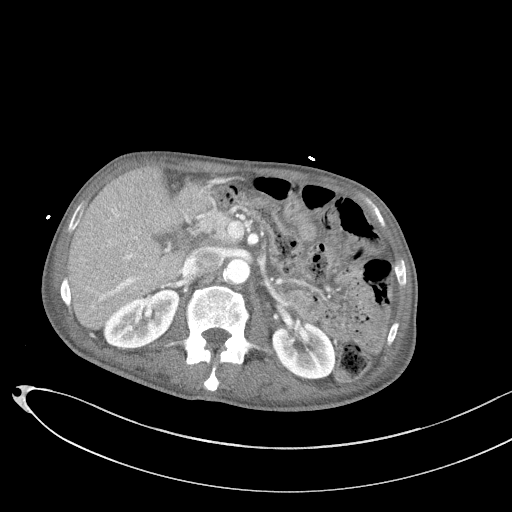
[im 70/89  soft-tissue]
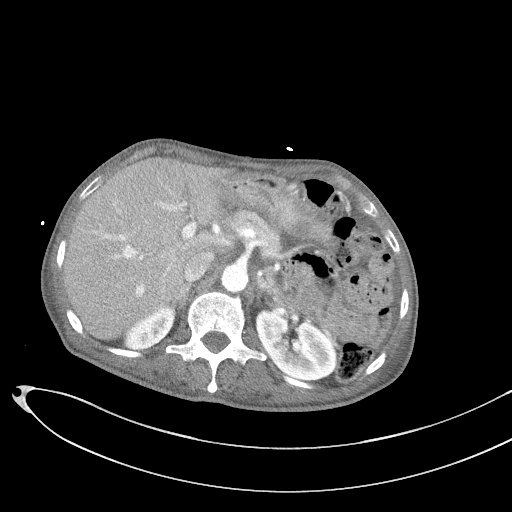
[im 75/89  soft-tissue]
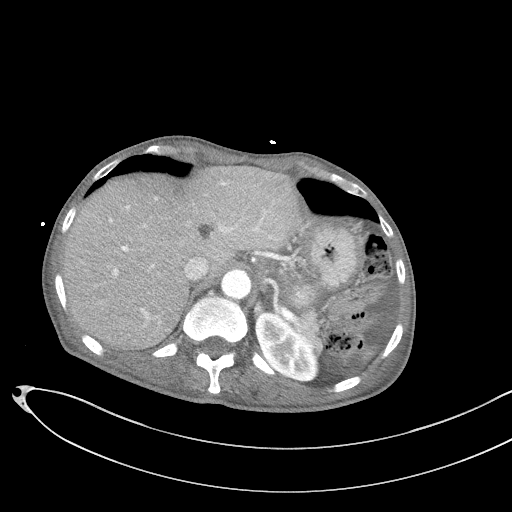
[im 84/89  soft-tissue]
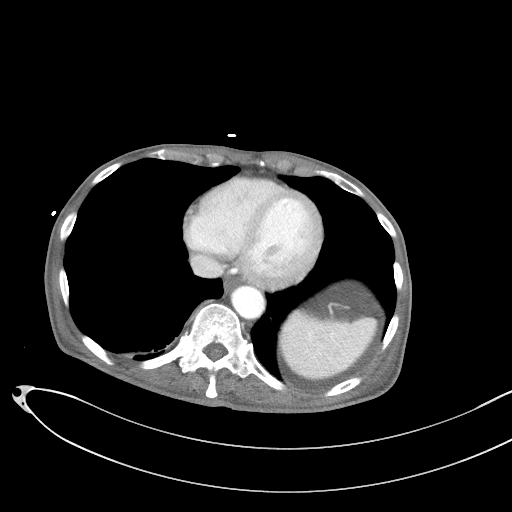

[Series 6: a/p w/ cor · coronal · 0.76mm/px · 3 of 129 slices shown]
[im 43/129  soft-tissue]
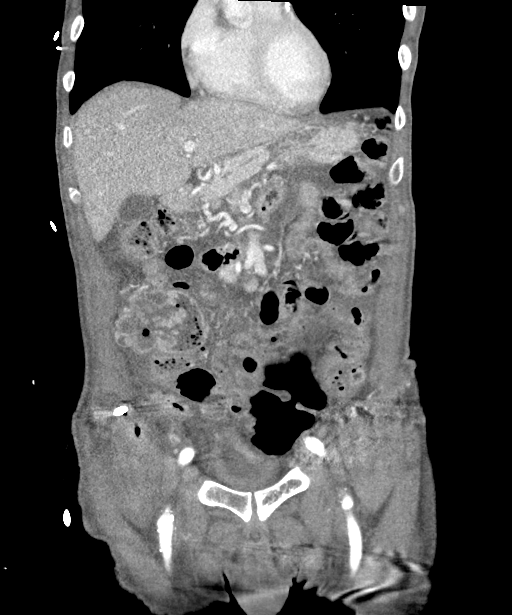
[im 57/129  soft-tissue]
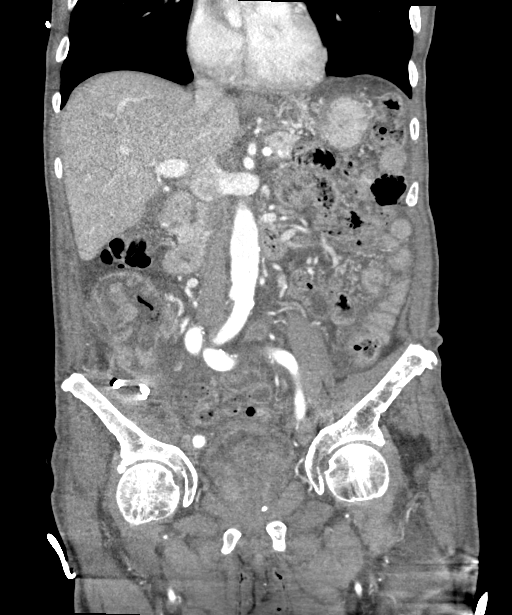
[im 72/129  soft-tissue]
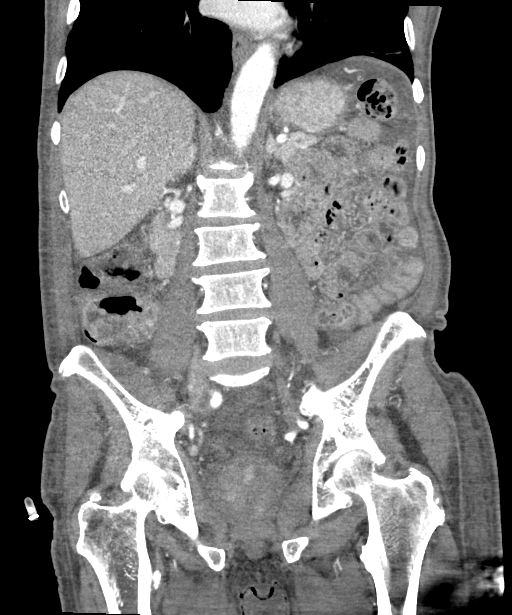

[16 of 46 positions shown; findings below may reference images not displayed]

FINDINGS: Lower chest: Minimal bibasilar atelectasis

Hepatobiliary: Cyst centrally LEFT lobe liver 12 mm greatest size.
Tiny RIGHT lobe liver cyst 4 mm. Gallbladder and liver otherwise
unremarkable

Pancreas: Normal appearance

Spleen: Normal appearance

Adrenals/Urinary Tract: Tiny LEFT renal cyst unchanged. Adrenal
glands, kidneys, and ureters otherwise normal appearance. Bladder
wall appears thickened likely related to decompressed state.

Stomach/Bowel: Stomach decompressed. Wall thickening of cecum
adjacent to RIGHT lower quadrant abscess. Remaining bowel loops
unremarkable.

Vascular/Lymphatic: Atherosclerotic calcifications aorta and iliac
arteries without aneurysm. Vascular structures patent. No
adenopathy.

Reproductive: Prostatic enlargement.

Other: Scattered mild ascites. Diffuse soft tissue edema. No free
air or hernia. Pigtail drainage catheter identified RIGHT lower
quadrant at RIGHT iliacus with small residual fluid collection 2.7 x
2.2 x 2.2 cm.

Musculoskeletal: Coarsened trabecula and slight cortical thickening
of LEFT iliac bone consistent with Paget's disease unchanged.
Minimal degenerative disc and facet disease changes lumbar spine.
IMPRESSION: Pigtail drainage catheter at RIGHT lower quadrant with small
residual fluid collection at site of prior abscess measuring 2.7 x
2.2 x 2.2 cm.

Wall thickening of adjacent cecum, not significantly changed, could
be related to neoplasm or be inflammatory in origin; follow-up
colonoscopy recommended.

Scattered mild ascites and diffuse soft tissue edema.

No new abscess collections identified.

Aortic Atherosclerosis ([1H]-[1H]).

## 2021-07-09 MED ORDER — IOHEXOL 300 MG/ML  SOLN
100.0000 mL | Freq: Once | INTRAMUSCULAR | Status: AC | PRN
  Administered 2021-07-09: 100 mL via INTRAVENOUS

## 2021-07-09 NOTE — ED Notes (Signed)
Follow up care reviewed and explained, pt verbalized understanding.

## 2021-07-09 NOTE — ED Provider Notes (Signed)
[facility] EMERGENCY DEPARTMENT Provider Note   CSN: [id number] Arrival date & time: 07/09/21  1304     History Chief Complaint  Patient presents with   leaking drain    Albert Love is a 80 y.o. male.  HPI Patient presents with increasing drainage around his right lower quadrant JP drain.  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  Recent admissions for same.  Now back at home.  States he was not doing well at the rehab place and went back home.  States there is been more drainage that is foul-smelling coming from around the drain.  States he has been changing the dressings.  States he has been cleaning it.  States he was showed how to flush the drain but had not been given flushing supplies.  Patient is felt a little dizzy at home.  States he has to take care when standing up.  States it is not however necessarily worse than it was.  Still tenderness on the belly.  States it may hurt more than it did previously.    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   Pneumoperitoneum 09/05/2019   Acute renal insufficiency 09/05/2019    Perforated gastric ulcer s/p omental Graham patch 09/05/2019 09/05/2019   Coagulopathy ([place]) 09/05/2019    Past Surgical History:  Procedure Laterality Date   BOWEL RESECTION N/A 09/05/2019   Procedure: SMALL BOWEL RESECTION;  Surgeon: Clovis Riley, MD;  Location: [facility];  Service: General;  Laterality: N/A;   IR CATHETER TUBE CHANGE  06/22/2021   IR SINUS/FIST TUBE CHK-NON GI  09/19/2019   IR SINUS/FIST TUBE CHK-NON GI  09/19/2019   LAPAROTOMY N/A 09/05/2019   Procedure: EXPLORATORY LAPAROTOMY WITH [facility] AND GASTRIC BIOPSY;  Surgeon: Clovis Riley, MD;  Location: [facility];  Service: General;  Laterality: N/A;       Family History  Problem Relation Age of Onset   Colon cancer Neg Hx    Pancreatic cancer Neg Hx    Esophageal cancer Neg Hx     Social History   Tobacco Use   Smoking status: Every Day    Packs/day: 0.50    Types: Cigarettes   Smokeless tobacco: Never  Substance Use Topics   Alcohol use: Yes    Comment: occasionally     Home Medications Prior to Admission medications   Medication Sig Start Date End Date Taking? Authorizing Provider  atorvastatin (LIPITOR) 80 MG tablet Take 80 mg by mouth daily.    [provider]  Baclofen 5 MG TABS Take 5 mg by mouth 2 (two) times daily. 06/29/21   Lurline Del, DO  famotidine (  PEPCID) 10 MG tablet Take 1 tablet (10 mg total) by mouth daily. 06/16/21   Holley Bouche, MD  feeding supplement (ENSURE ENLIVE / ENSURE PLUS) LIQD Take 237 mLs by mouth 3 (three) times daily between meals. 06/15/21   Holley Bouche, MD  feeding supplement (ENSURE ENLIVE / ENSURE PLUS) LIQD Take 237 mLs by mouth 2 (two) times daily between meals. 06/29/21   Lurline Del, DO  ferrous sulfate 325 (65 FE) MG EC tablet Take 1 tablet (325 mg total) by mouth daily with breakfast. 05/12/21   Gatha Mayer, MD  melatonin 5 MG TABS Take 1 tablet (5 mg total) by mouth at bedtime. 06/24/21   [name], MD  Multiple Vitamin  (MULTIVITAMIN WITH MINERALS) TABS tablet Take 1 tablet by mouth daily. 06/16/21   Holley Bouche, MD  tamsulosin (FLOMAX) 0.4 MG CAPS capsule Take 1 capsule (0.4 mg total) by mouth daily. 06/25/21   [name], MD    Allergies    Patient has no known allergies.  Review of Systems   Review of Systems  Constitutional:  Negative for appetite change.  HENT:  Negative for congestion.   Respiratory:  Negative for shortness of breath.   Cardiovascular:  Negative for chest pain.  Gastrointestinal:  Positive for abdominal pain.  Genitourinary:  Negative for flank pain.  Musculoskeletal:  Negative for back pain.  Skin:  Negative for rash.  Neurological:  Positive for light-headedness.  Psychiatric/Behavioral:  Negative for confusion.    Physical Exam Updated Vital Signs BP (!) 146/86   Pulse 67   Temp 98.4 F (36.9 C)   Resp 14   SpO2 100%   Physical Exam Vitals and nursing note reviewed.  Constitutional:      Appearance: Normal appearance.     Comments: Patient is chronically ill-appearing.  Eyes:     Pupils: Pupils are equal, round, and reactive to light.  Cardiovascular:     Rate and Rhythm: Regular rhythm.  Pulmonary:     Breath sounds: No wheezing or rhonchi.  Abdominal:     Tenderness: There is abdominal tenderness.     Comments: Right lower quadrant drain.  Purulent drainage around the catheter.  Foul-smelling.  Some drainage in the catheter also.  Tenderness in the right lower quadrant.  Musculoskeletal:        General: No tenderness.     Cervical back: Neck supple.  Skin:    General: Skin is warm.     Capillary Refill: Capillary refill takes less than 2 seconds.  Neurological:     Mental Status: He is alert and oriented to person, place, and time.    ED Results / Procedures / Treatments   Labs (all labs ordered are listed, but only abnormal results are displayed) Labs Reviewed  CBC WITH DIFFERENTIAL/PLATELET - Abnormal; Notable for the following  components:      Result Value   Hemoglobin 9.5 (*)    HCT 31.3 (*)    MCV 72.8 (*)    MCH 22.1 (*)    RDW 26.9 (*)    All other components within normal limits  COMPREHENSIVE METABOLIC PANEL    EKG None  Radiology No results found.  Procedures Procedures   Medications Ordered in ED Medications - No data to display  ED Course  I have reviewed the triage vital signs and the nursing notes.  Pertinent labs & imaging results that were available during my care of the patient were reviewed by me and considered in my medical  decision making (see chart for details).    MDM Rules/Calculators/A&P                           Patient with purulent drainage around JP drain in right lower quadrant.  It is draining an intra-abdominal abscess around a nonoperative likely cancer.  Patient is in palliative care but not hospice.  We will get basic blood work and repeat CT scan.  Care turned over to Dr. Karle Starch Final Clinical Impression(s) / ED Diagnoses Final diagnoses:  None    Rx / DC Orders ED Discharge Orders     None        Davonna Belling, MD 07/09/21 1515

## 2021-07-09 NOTE — ED Triage Notes (Signed)
Pt BIB [facility] from home c/o of leaking fluid around his drain. Pt is a poor historian. Pt has no idea why he has the drain or anything medical related. Pt states he was just trying to change his dressing and noticed it was leaking.

## 2021-07-09 NOTE — ED Provider Notes (Signed)
Care of the patient assumed at the change of shift. 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 Awaiting CT.  Physical Exam  BP (!) 160/107   Pulse 61   Temp 98.4 F (36.9 C)   Resp (!) 26   SpO2 (!) 13%   Physical Exam  ED Course/Procedures   Clinical Course as of 07/09/21 1802  Fri Jul 09, 2021  1730 CT images and results reviewed, drain is in the correct place, small residual abscess and cecal wall thicking but no other new complicating findings.  [CS]  [id number] with Dr. Vernard Gambles, IR who will arrange for the patient to follow up in their outpatient clinic for re-evaluation. Will need to continue with dressing changes at home over the weekend. RTED for any other concerns.  [CS]    Clinical Course User Index [CS] Truddie Hidden, MD    Procedures  MDM         Truddie Hidden, MD 07/09/21 [phone]

## 2021-07-10 ENCOUNTER — Other Ambulatory Visit: Payer: Self-pay | Admitting: Radiology

## 2021-07-10 DIAGNOSIS — K651 Peritoneal abscess: Secondary | ICD-10-CM

## 2021-07-12 ENCOUNTER — Telehealth: Payer: Self-pay

## 2021-07-12 ENCOUNTER — Inpatient Hospital Stay (HOSPITAL_COMMUNITY): Admission: RE | Admit: 2021-07-12 | Payer: Medicare Other | Source: Ambulatory Visit

## 2021-07-12 NOTE — Telephone Encounter (Signed)
Attempted to contact patient on both numbers in chart to schedule a Palliative Care consult appointment. No answer left a message to return call on ne number and the other number the voicemail is full.

## 2021-07-13 ENCOUNTER — Ambulatory Visit (HOSPITAL_COMMUNITY)
Admission: RE | Admit: 2021-07-13 | Discharge: 2021-07-13 | Disposition: A | Discharge status: Home / Self Care | Payer: Medicare Other | Source: Ambulatory Visit | Attending: Radiology | Admitting: Radiology

## 2021-07-13 ENCOUNTER — Other Ambulatory Visit: Payer: Self-pay

## 2021-07-13 ENCOUNTER — Other Ambulatory Visit: Payer: Self-pay | Admitting: Radiology

## 2021-07-13 DIAGNOSIS — Z4803 Encounter for change or removal of drains: Secondary | ICD-10-CM | POA: Diagnosis not present

## 2021-07-13 DIAGNOSIS — K651 Peritoneal abscess: Secondary | ICD-10-CM | POA: Diagnosis not present

## 2021-07-13 DIAGNOSIS — T85638A Leakage of other specified internal prosthetic devices, implants and grafts, initial encounter: Secondary | ICD-10-CM | POA: Diagnosis not present

## 2021-07-13 HISTORY — PX: IR CATHETER TUBE CHANGE: IMG717

## 2021-07-13 IMAGING — XA IR CATHETER TUBE CHANGE
3 series · 3 of 3 positions shown · non-contrast
Comparison: none

INDICATION: 80-year-old gentleman with fluid leaking around right lower quadrant
abscess drain returns to IR for exchange and possible upsize.
Patient most recently upsized to 14 French drain on [DATE].

[Series 1: fl (-) angio · 1 of 1 slices shown (1 of 3)]
[im 1/1]
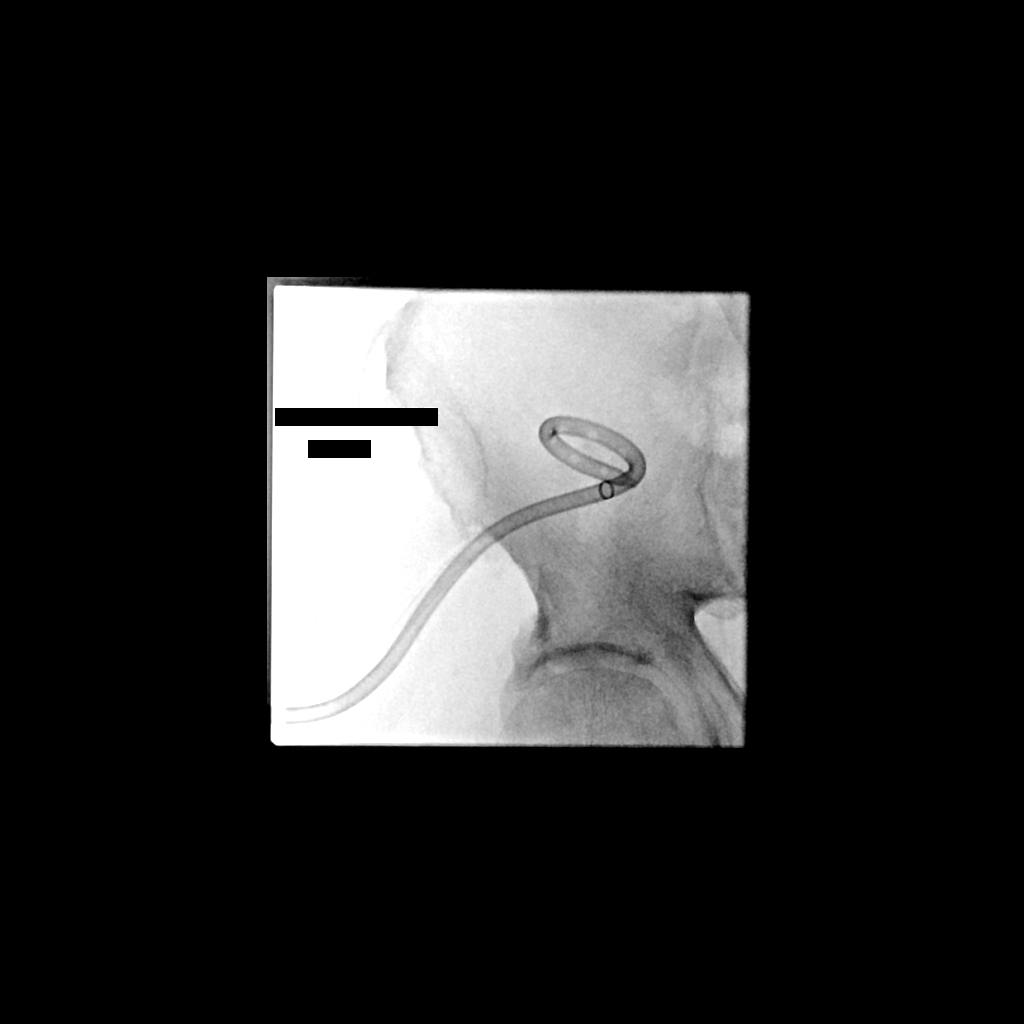

[Series 2: fl (-) angio · 1 of 1 slices shown (2 of 3)]
[im 1/1]
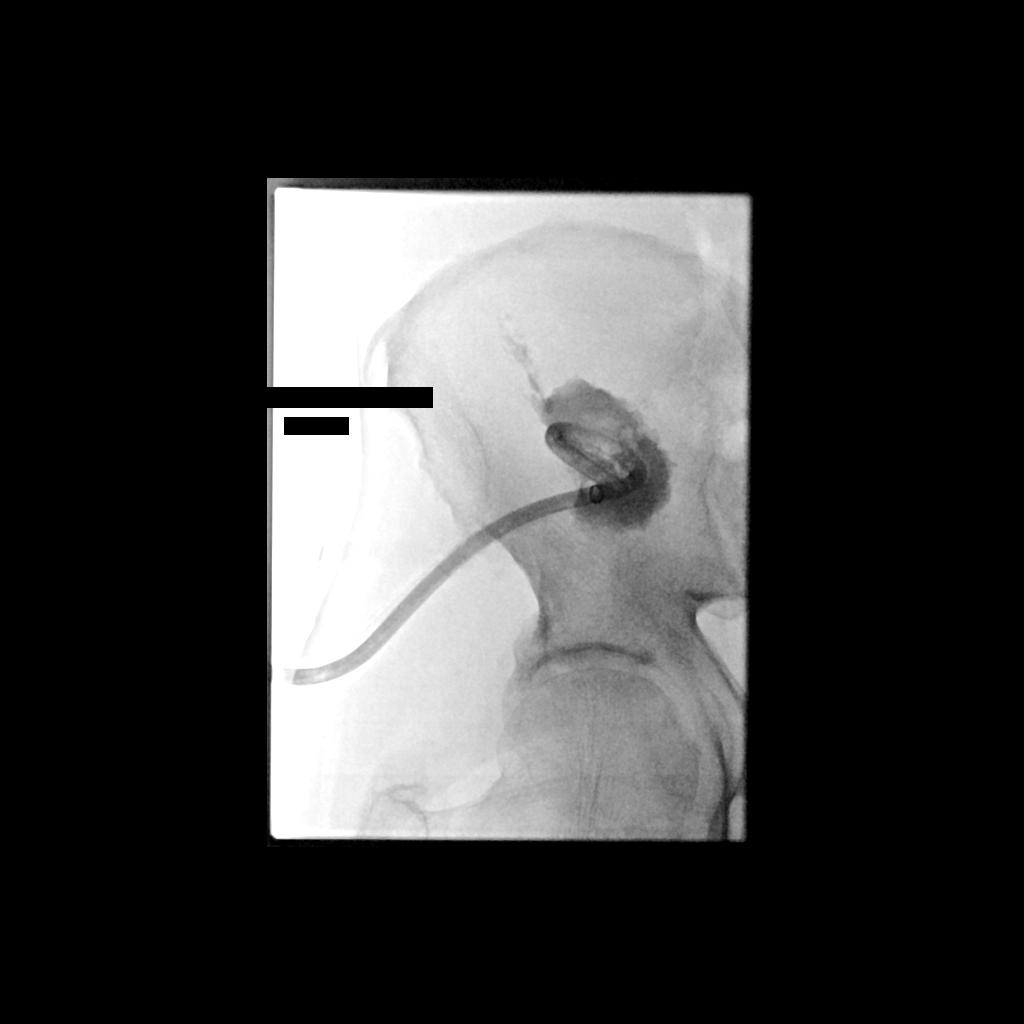

[Series 3: fl (-) angio · 1 of 1 slices shown (3 of 3)]
[im 1/1]
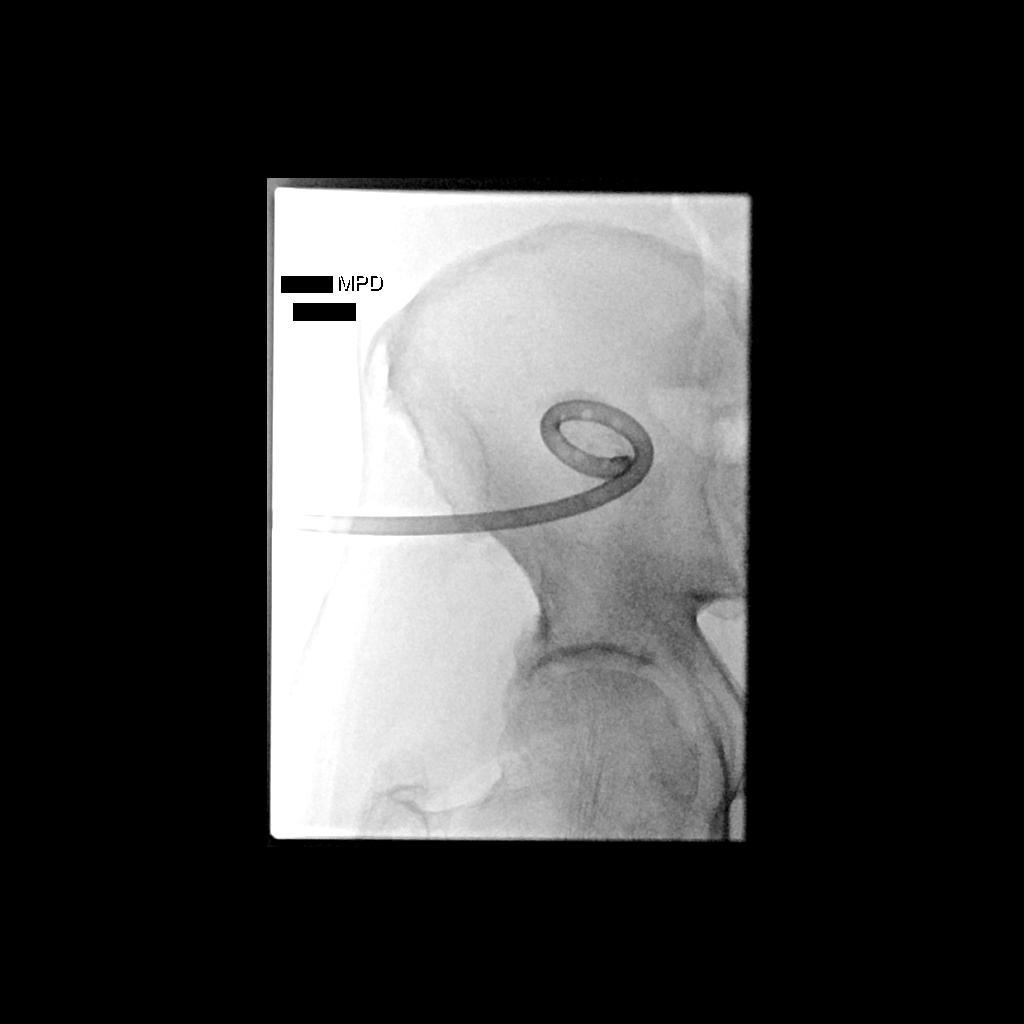

[3 of 3 positions shown; findings below may reference images not displayed]

EXAM:
Fluoroscopy guided exchange and upsize of right pelvic abscess drain

MEDICATIONS:
None

ANESTHESIA/SEDATION:
None

COMPLICATIONS:
None immediate.

PROCEDURE:
Informed written consent was obtained from the patient after a
thorough discussion of the procedural risks, benefits and
alternatives. All questions were addressed. Maximal Sterile Barrier
Technique was utilized including caps, mask, sterile gowns, sterile
gloves, sterile drape, hand hygiene and skin antiseptic. A timeout
was performed prior to the initiation of the procedure.

Scout image demonstrated the existing 14 French drain in appropriate
position, this was confirmed by administering contrast through the
drain under fluoroscopy. The existing drain was cut and removed over
a 0.035 inch guidewire and replaced with a 16 French multipurpose
pigtail drain. Contrast administered through the new drain confirmed
appropriate positioning under fluoroscopy.

The drain was secured to skin with suture and connected to bulb
suction.
IMPRESSION: Existing 14 French right pelvic abscess drain exchanged and upsized
to 16 French for treatment of leakage around catheter.

PLAN:
I requested that the patient start flushing the drain with 5 mL
saline daily in hopes that the drain will not clogged which in turn
will decrease the amount of drainage around the catheter. The
patient was train by our PA on how to flush the drain.

## 2021-07-13 MED ORDER — LIDOCAINE HCL 1 % IJ SOLN
20.0000 mL | Freq: Once | INTRAMUSCULAR | Status: AC
  Administered 2021-07-13: 3 mL

## 2021-07-13 MED ORDER — IOHEXOL 300 MG/ML  SOLN
100.0000 mL | Freq: Once | INTRAMUSCULAR | Status: AC | PRN
  Administered 2021-07-13: 10 mL

## 2021-07-13 NOTE — Chronic Care Management (AMB) (Signed)
  Care Management   Outreach Note  07/13/2021 Name: [name] MRN: [id number] DOB: 06-24-1941  Referred by: Gladys Damme, MD Reason for referral : Care Coordination (Initial outreach to schedule referral with [facility])   Third unsuccessful telephone outreach was attempted today. The patient was referred to the case management team for assistance with care management and care coordination. The patient's primary care provider has been notified of our unsuccessful attempts to make or maintain contact with the patient. The care management team is pleased to engage with this patient at any time in the future should he/she be interested in assistance from the care management team.   Follow Up Plan:  We have been unable to make contact with the patient. The care management team is available to follow up with the patient after provider conversation with the patient regarding recommendation for care management engagement and subsequent re-referral to the care management team. A HIPAA compliant phone message was left for the patient providing contact information and requesting a return call. If patient returns call to provider office, please advise to call [place] at [phone].  [facility]  Direct Dial: [phone]

## 2021-07-19 ENCOUNTER — Telehealth: Payer: Self-pay

## 2021-07-19 NOTE — Telephone Encounter (Signed)
Spoke with patient and scheduled an in-person Palliative Consult for 07/29/21 @ [place] screening was negative. No pets in home. Patient lives alone.  Consent obtained; updated Outlook/Netsmart/Team List and Epic.   Patient is aware he may be receiving a call from provider the day before or day of to confirm appointment.

## 2021-07-21 ENCOUNTER — Ambulatory Visit
Admission: RE | Admit: 2021-07-21 | Discharge: 2021-07-21 | Disposition: A | Discharge status: Home / Self Care | Payer: Medicare Other | Source: Ambulatory Visit | Attending: Student | Admitting: Student

## 2021-07-21 ENCOUNTER — Ambulatory Visit
Admission: RE | Admit: 2021-07-21 | Discharge: 2021-07-21 | Disposition: A | Discharge status: Home / Self Care | Payer: Medicare Other | Source: Ambulatory Visit | Attending: Physician Assistant | Admitting: Physician Assistant

## 2021-07-21 ENCOUNTER — Other Ambulatory Visit: Payer: Self-pay | Admitting: Physician Assistant

## 2021-07-21 DIAGNOSIS — L0291 Cutaneous abscess, unspecified: Secondary | ICD-10-CM

## 2021-07-21 DIAGNOSIS — K6389 Other specified diseases of intestine: Secondary | ICD-10-CM | POA: Diagnosis not present

## 2021-07-21 DIAGNOSIS — K651 Peritoneal abscess: Secondary | ICD-10-CM | POA: Diagnosis not present

## 2021-07-21 DIAGNOSIS — Z4682 Encounter for fitting and adjustment of non-vascular catheter: Secondary | ICD-10-CM | POA: Diagnosis not present

## 2021-07-21 DIAGNOSIS — Z4582 Encounter for adjustment or removal of myringotomy device (stent) (tube): Secondary | ICD-10-CM | POA: Diagnosis not present

## 2021-07-21 DIAGNOSIS — K6811 Postprocedural retroperitoneal abscess: Secondary | ICD-10-CM | POA: Diagnosis not present

## 2021-07-21 DIAGNOSIS — K7689 Other specified diseases of liver: Secondary | ICD-10-CM | POA: Diagnosis not present

## 2021-07-21 HISTORY — PX: IR RADIOLOGIST EVAL & MGMT: IMG5224

## 2021-07-21 IMAGING — RF DG SINUS / FISTULA TRACT / ABSCESSOGRAM
2 series · 5 of 5 positions shown · non-contrast
Comparison: none

INDICATION: 80-year-old gentleman with history of perforated right colon mass
status post abscess drain placement on [DATE] and drain upsized
most recently on [DATE] returns to IR for abscessogram. Patient
reports approximately 25-30 mL daily output.

[Series 1: one shot · 1 of 1 slices shown]
[im 1/1]
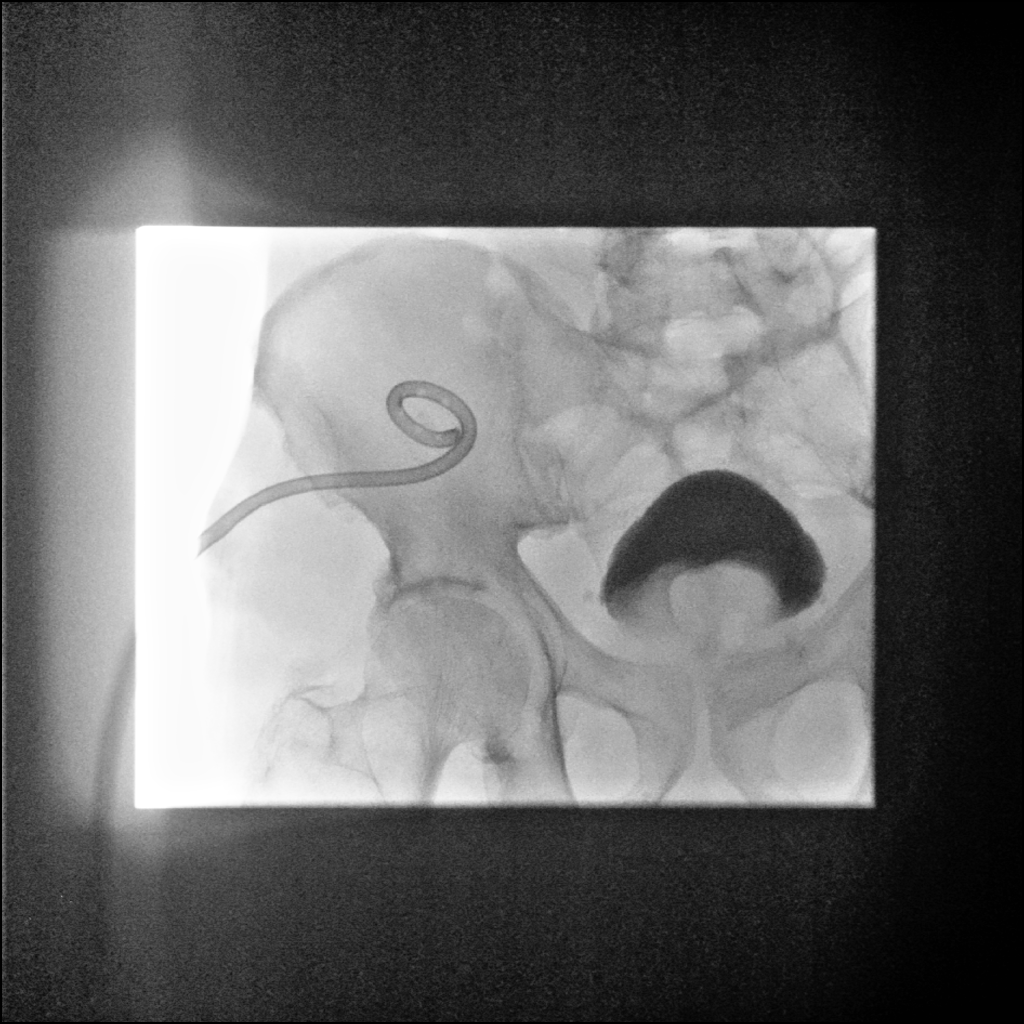

[Series 2: sequence · 4 of 28 frames shown]
[frame 1/28]
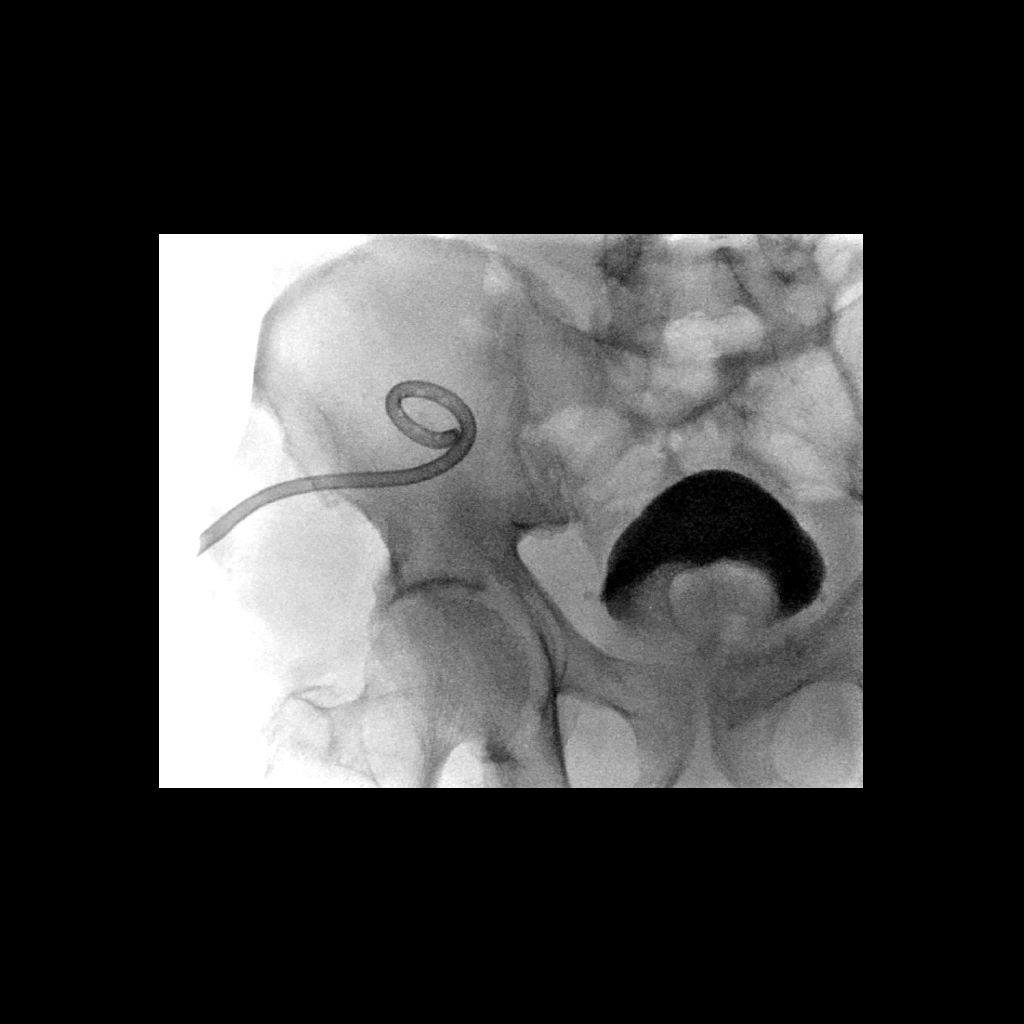
[frame 5/28]
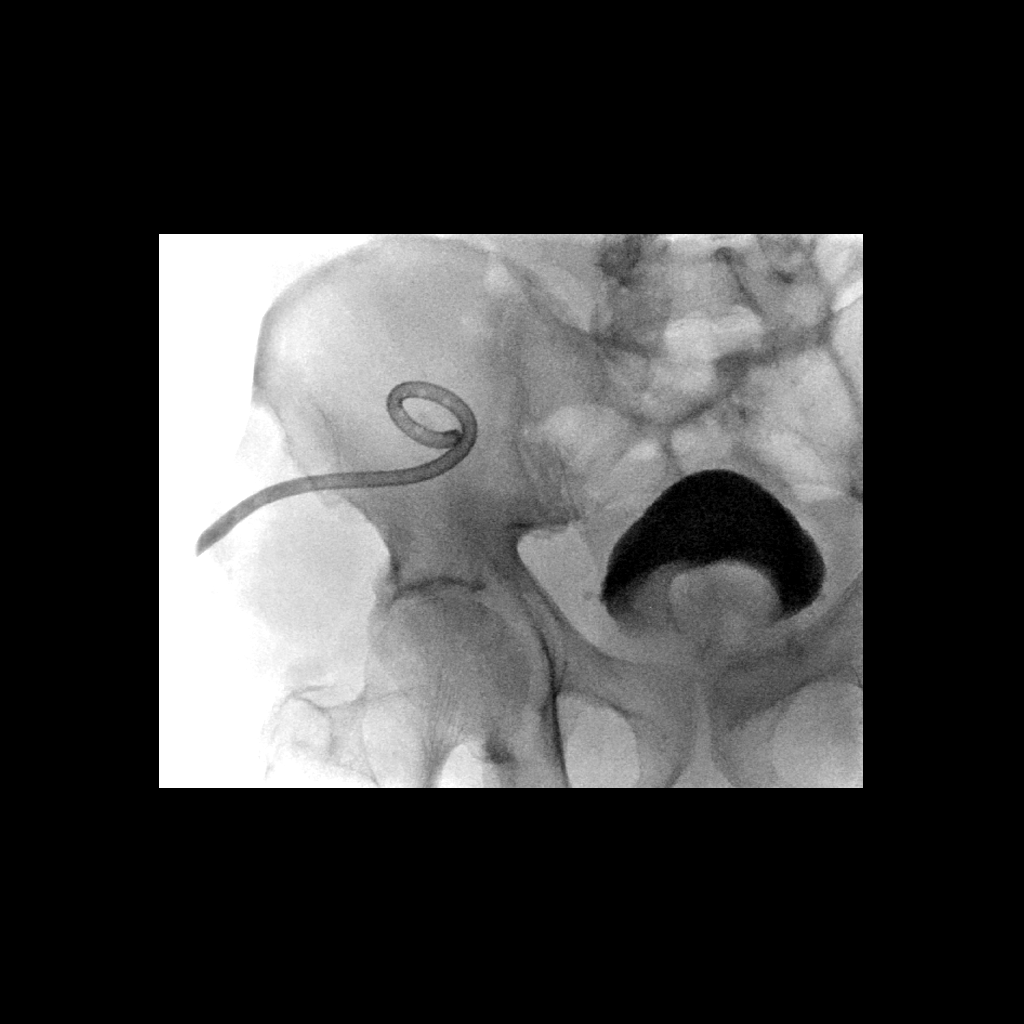
[frame 15/28]
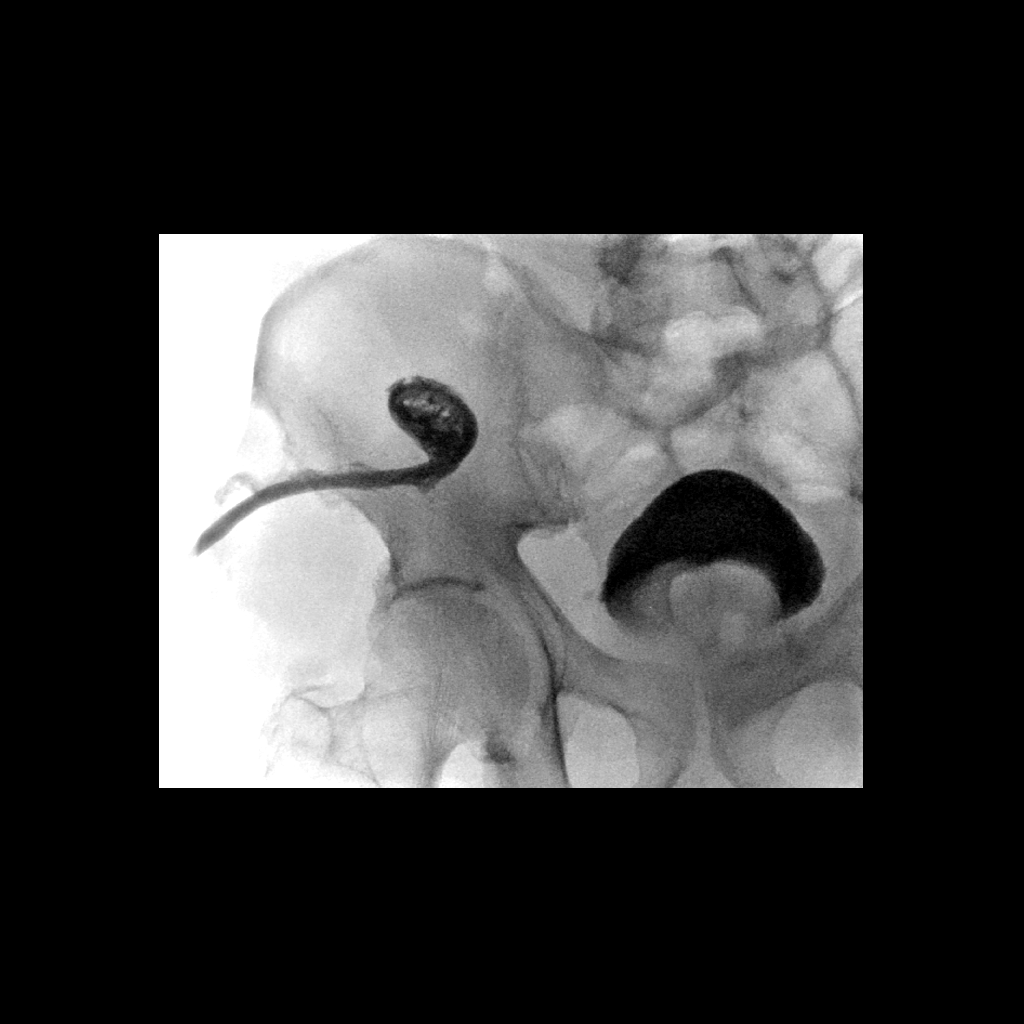
[frame 24/28]
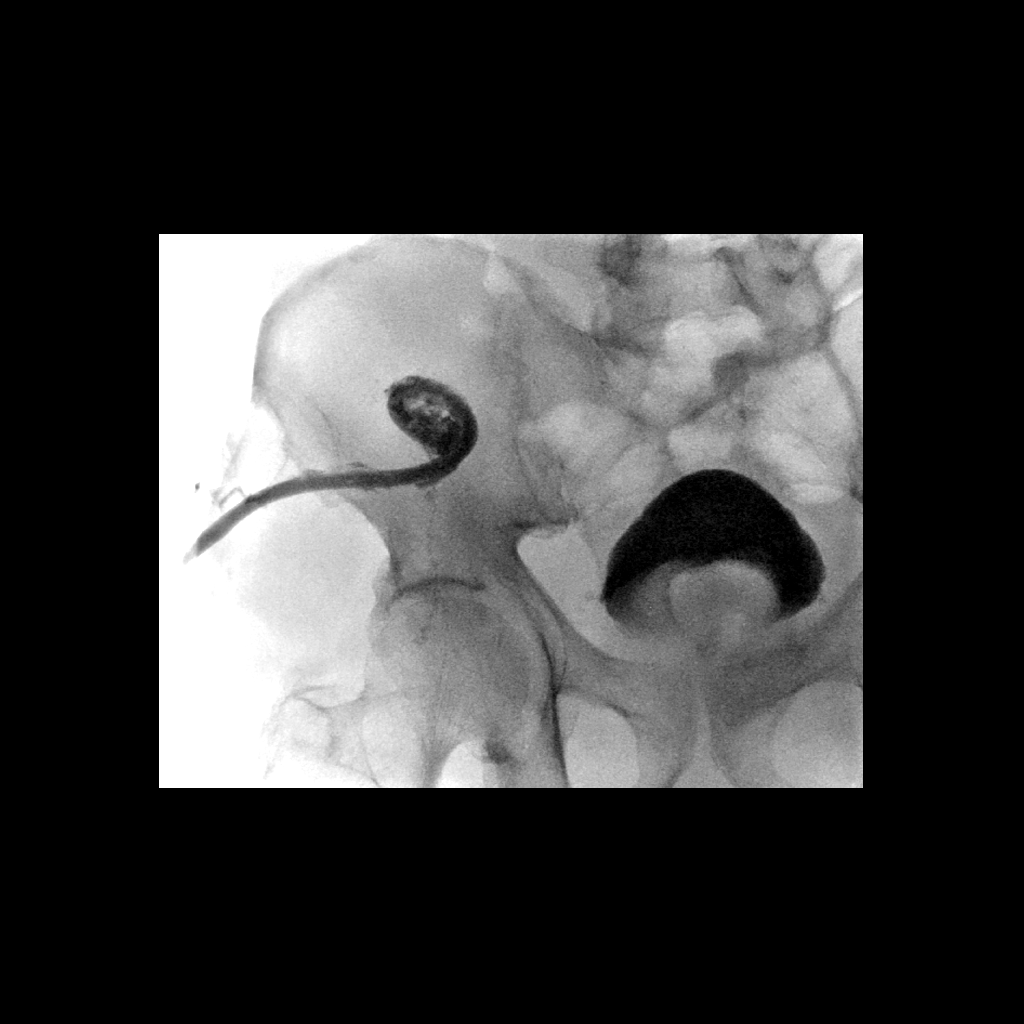

[5 of 5 positions shown; findings below may reference images not displayed]

EXAM:
Fluoroscopic abscessogram

MEDICATIONS:
None

ANESTHESIA/SEDATION:
None

COMPLICATIONS:
None immediate.

PROCEDURE:
Informed written consent was obtained from the patient after a
thorough discussion of the procedural risks, benefits and
alternatives. A timeout was performed prior to the initiation of the
procedure.

Scout image demonstrates right lower quadrant abscess drain in
unchanged position. Contrast administered through the drain under
fluoroscopy showed minimal residual cavity. Drain flushed and
reattached to bag.
IMPRESSION: Right lower quadrant abscessogram demonstrates minimal residual
cavity. However, given history of 25-30 mL of daily output, there is
likely a fistulous communication with the right colon. Therefore the
drain was left in place.

## 2021-07-21 IMAGING — CT CT ABD-PELV W/ CM
2 of 4 series · 13 of 46 positions shown, 15 images · IV contrast (iopamidol)
Comparison: [DATE]

CLINICAL DATA: 80-year-old gentleman with right colonic mass and
right lower quadrant abscess status post drain placement on
[DATE] and most recent exchanged and upsized on [DATE]
returns to IR for drain evaluation.

EXAM:
CT ABDOMEN AND PELVIS WITH CONTRAST
TECHNIQUE: Multidetector CT imaging of the abdomen and pelvis was performed
using the standard protocol following bolus administration of
intravenous contrast.
CONTRAST:  100mL [SJ] IOPAMIDOL ([SJ]) INJECTION 61%

[Series 2: abd pelvis 5.00 br40 s3 axial · axial · 0.58mm/px · z∈[+1189,+1534]mm · 10 of 85 slices shown, 12 images]
[im 8/85  soft-tissue]
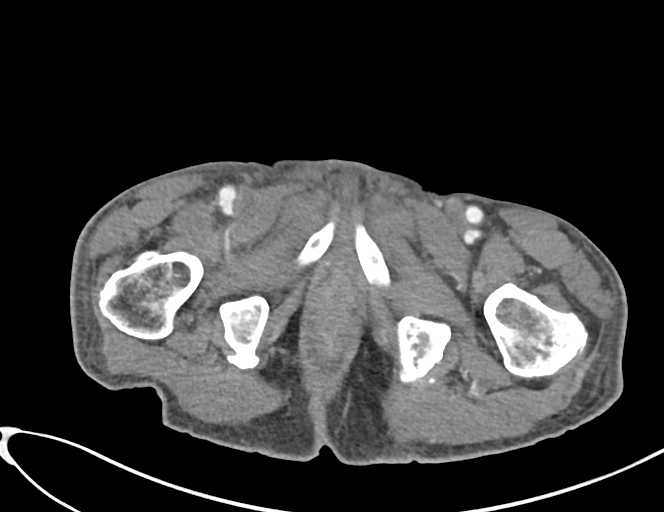
[im 8/85  bone]
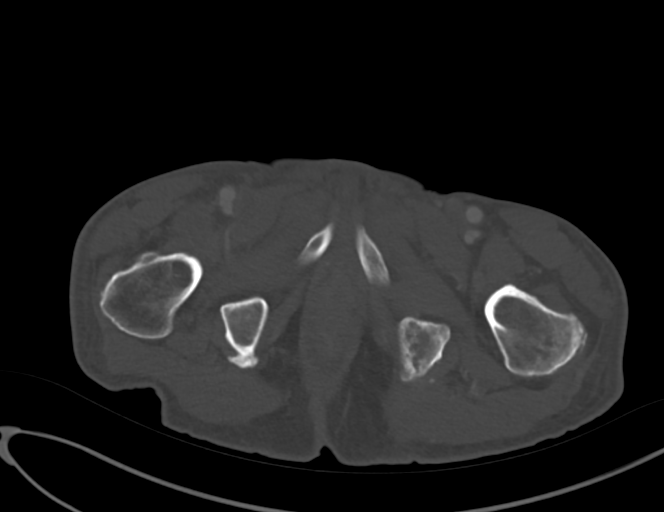
[im 15/85  soft-tissue]
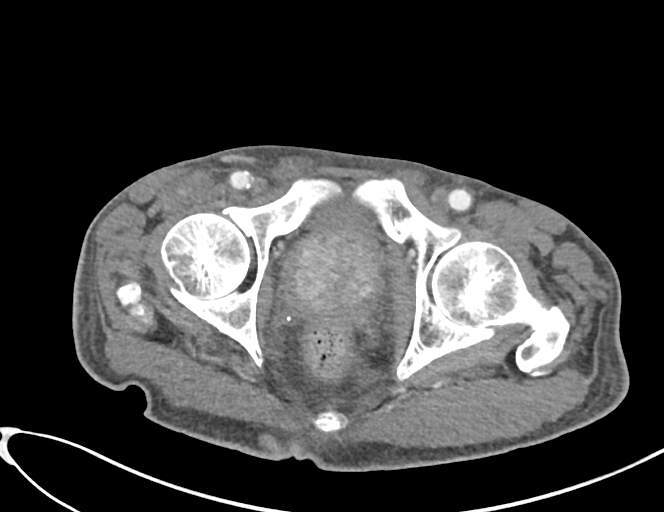
[im 22/85  soft-tissue]
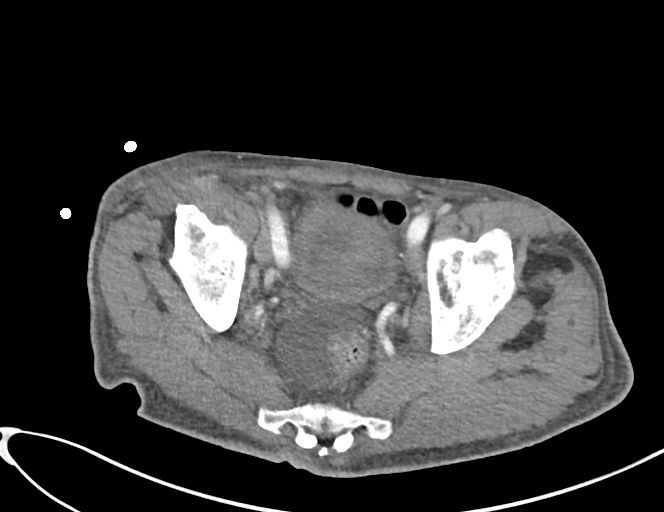
[im 30/85  soft-tissue]
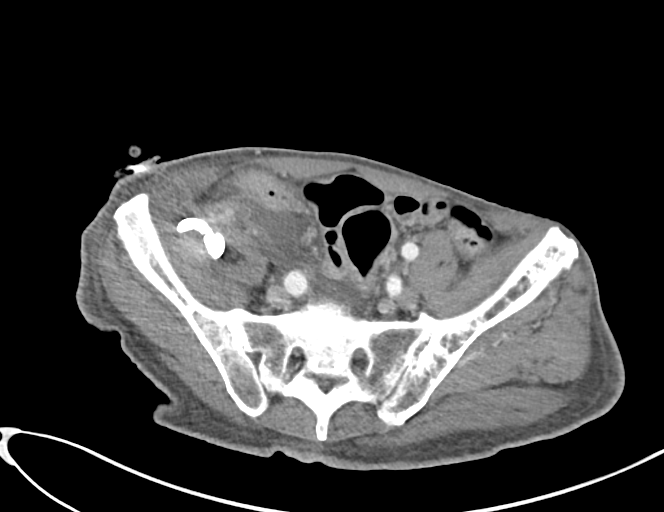
[im 37/85  soft-tissue]
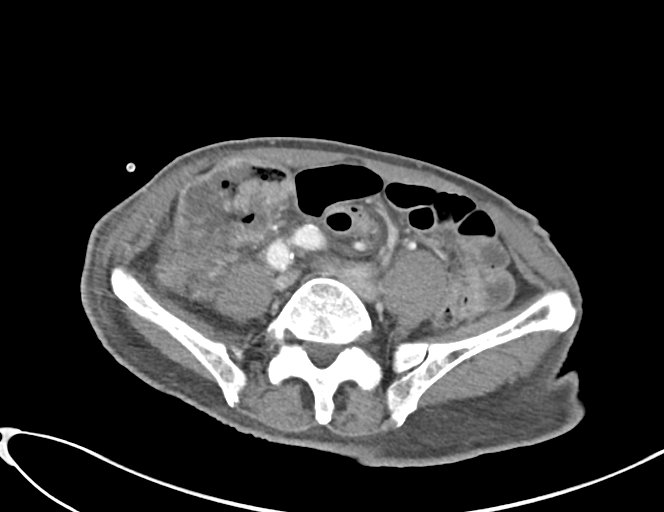
[im 48/85  soft-tissue]
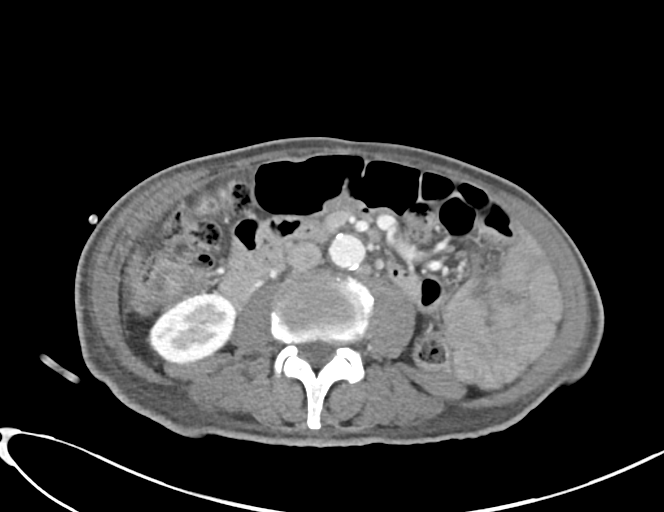
[im 55/85  soft-tissue]
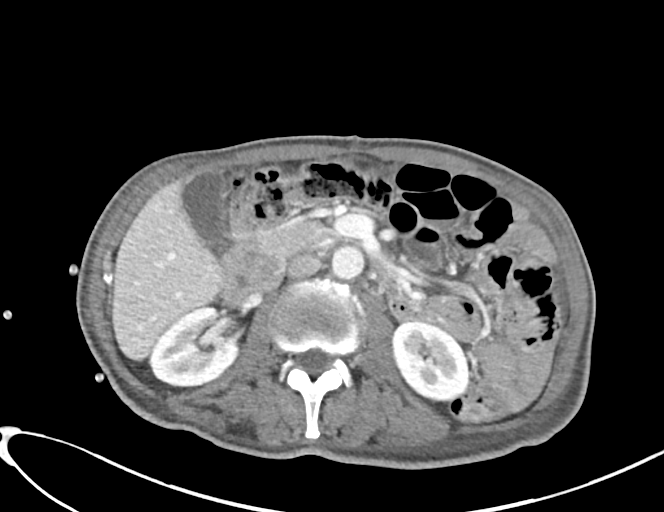
[im 63/85  soft-tissue]
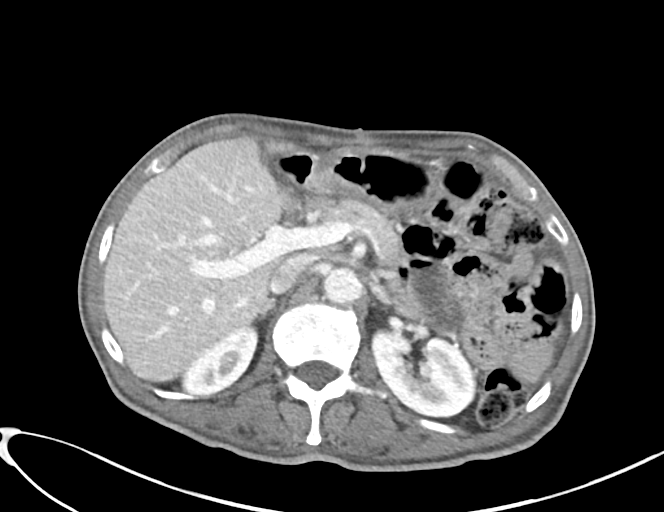
[im 70/85  soft-tissue]
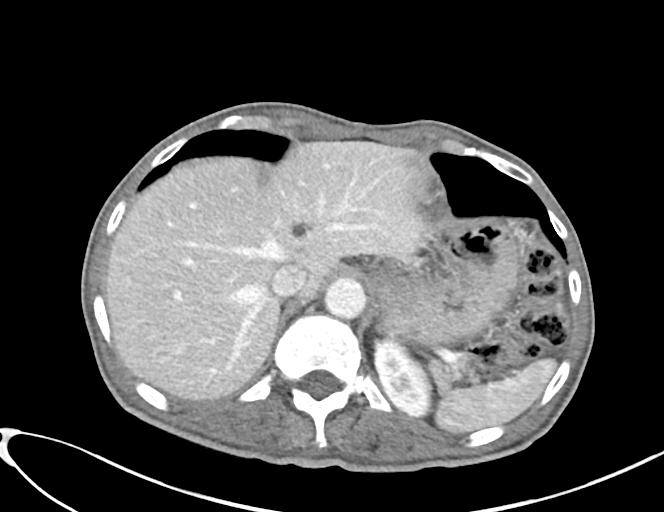
[im 70/85  bone]
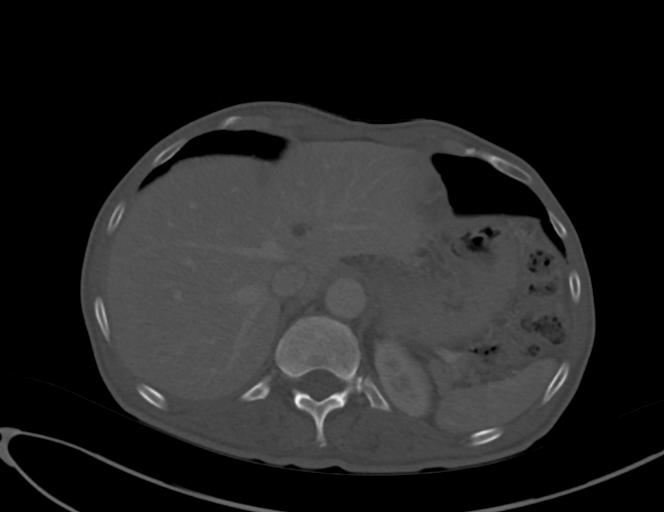
[im 77/85  soft-tissue]
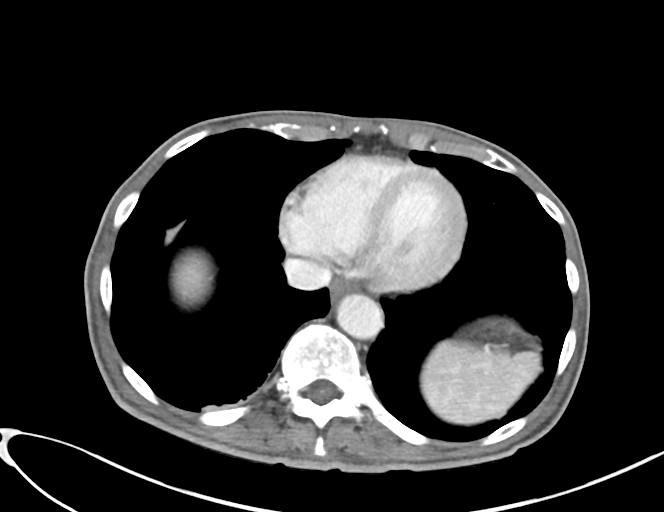

[Series 6: abd pelvis 2.00 br40 s3 cor · coronal · 0.66mm/px · 3 of 109 slices shown]
[im 37/109  soft-tissue]
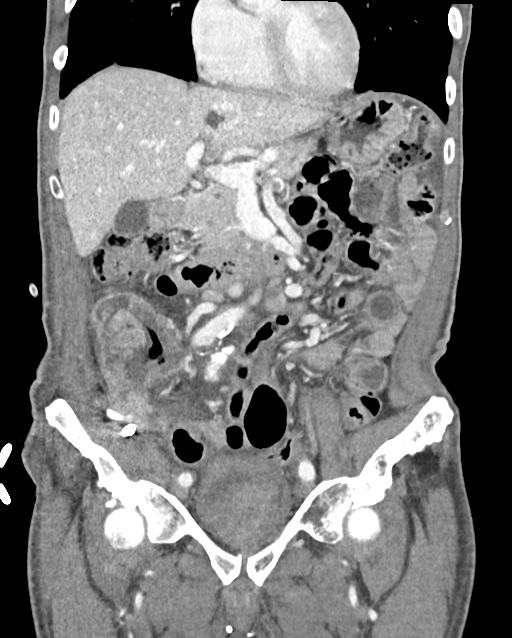
[im 49/109  soft-tissue]
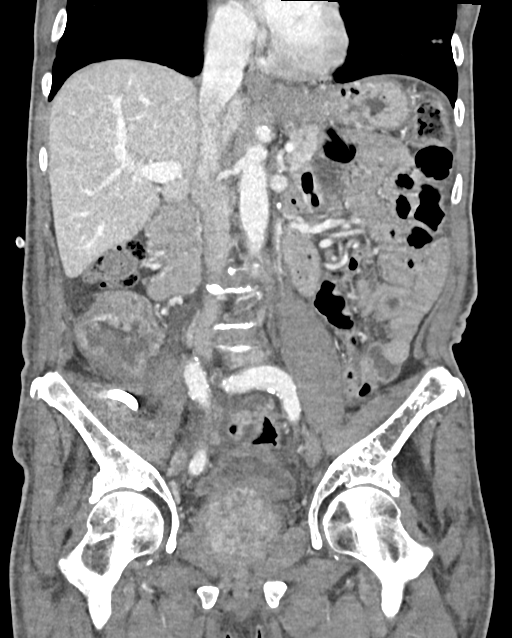
[im 61/109  soft-tissue]
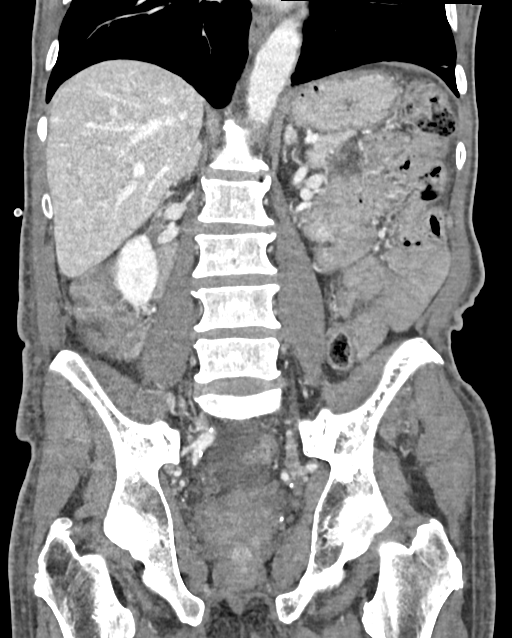

[13 of 46 positions shown; findings below may reference images not displayed]

FINDINGS: Lower chest: Minimal bibasilar atelectasis.

Hepatobiliary: 1.5 cm simple cyst again seen in the left liver lobe.
Liver, gallbladder, and common bile duct is otherwise normal.

Pancreas: Unremarkable. No pancreatic ductal dilatation or
surrounding inflammatory changes.

Spleen: Normal in size without focal abnormality.

Adrenals/Urinary Tract: Adrenal glands are unremarkable. No
significant abnormality of the kidneys or ureters. Limited
evaluation of the bladder due to decompression. Diffuse bladder wall
thickening most likely due to outlet obstruction given markedly
enlarged prostate.

Stomach/Bowel: Right: Masses unchanged from prior examination. No
bowel dilatation to indicate ileus or obstruction.

Right lower quadrant abscess drain is again seen. No significant
residual abscess cavity is identified.

Vascular/Lymphatic: No significant vascular findings are present. No
enlarged abdominal or pelvic lymph nodes.

Reproductive: Markedly enlarged nodular prostate.

Other: Small amount of free fluid again noted in the pelvic floor.

Musculoskeletal: No acute or significant osseous findings.
IMPRESSION: Right lower quadrant abscess drain is unchanged in position. No
significant residual abscess cavity is identified.

## 2021-07-21 MED ORDER — IOPAMIDOL (ISOVUE-300) INJECTION 61%
100.0000 mL | Freq: Once | INTRAVENOUS | Status: AC | PRN
  Administered 2021-07-21: 100 mL via INTRAVENOUS

## 2021-07-21 NOTE — Progress Notes (Signed)
Chief Complaint: Patient was seen in consultation today for No chief complaint on file.  at the request of Han,Aimee H  Referring Physician(s): Han,Aimee H  History of Present Illness: Albert Love is a 80 y.o. male With perforated right colonic mass resulting in abscess formation.  Abscess initially drained with CT guidance on 06/06/2021.  Drain most recently upsized 07/13/2021 to 14 [place] due to leakage around the catheter site.  Reports no significant abdominal pain, fever, or chills.  He reports approximately 25-30 mL of serosanguineous output daily.    Past Medical History:  Diagnosis Date   Acute renal insufficiency 09/05/2019   CRAO (central retinal artery occlusion)    Perforated gastric ulcer s/p omental Graham patch 09/05/2019 09/05/2019    Past Surgical History:  Procedure Laterality Date   BOWEL RESECTION N/A 09/05/2019   Procedure: SMALL BOWEL RESECTION;  Surgeon: Clovis Riley, MD;  Location: [facility];  Service: General;  Laterality: N/A;   IR CATHETER TUBE CHANGE  06/22/2021   IR CATHETER TUBE CHANGE  07/13/2021   IR RADIOLOGIST EVAL & MGMT  07/21/2021   IR SINUS/FIST TUBE CHK-NON GI  09/19/2019   IR SINUS/FIST TUBE CHK-NON GI  09/19/2019   LAPAROTOMY N/A 09/05/2019   Procedure: EXPLORATORY LAPAROTOMY WITH [facility] AND GASTRIC BIOPSY;  Surgeon: Clovis Riley, MD;  Location: [facility];  Service: General;  Laterality: N/A;    Allergies: Patient has no known allergies.  Medications: Prior to Admission medications   Medication Sig Start Date End Date Taking? Authorizing Provider  atorvastatin (LIPITOR) 80 MG tablet Take 80 mg by mouth daily.    [provider]  Baclofen 5 MG TABS Take 5 mg by mouth 2 (two) times daily. 06/29/21   Lurline Del, DO  famotidine (PEPCID) 10 MG tablet Take 1 tablet (10 mg total) by mouth daily. 06/16/21   Holley Bouche, MD  feeding supplement (ENSURE ENLIVE / ENSURE PLUS) LIQD Take 237 mLs by mouth 3 (three) times  daily between meals. 06/15/21   Holley Bouche, MD  feeding supplement (ENSURE ENLIVE / ENSURE PLUS) LIQD Take 237 mLs by mouth 2 (two) times daily between meals. 06/29/21   Lurline Del, DO  ferrous sulfate 325 (65 FE) MG EC tablet Take 1 tablet (325 mg total) by mouth daily with breakfast. 05/12/21   Gatha Mayer, MD  melatonin 5 MG TABS Take 1 tablet (5 mg total) by mouth at bedtime. 06/24/21   [name], MD  Multiple Vitamin (MULTIVITAMIN WITH MINERALS) TABS tablet Take 1 tablet by mouth daily. 06/16/21   Holley Bouche, MD  tamsulosin (FLOMAX) 0.4 MG CAPS capsule Take 1 capsule (0.4 mg total) by mouth daily. 06/25/21   [name], MD     Family History  Problem Relation Age of Onset   Colon cancer Neg Hx    Pancreatic cancer Neg Hx    Esophageal cancer Neg Hx     Social History   Socioeconomic History   Marital status: Single    Spouse name: Not on file   Number of children: Not on file   Years of education: Not on file   Highest education level: Not on file  Occupational History   Not on file  Tobacco Use   Smoking status: Every Day    Packs/day: 0.50    Types: Cigarettes   Smokeless tobacco: Never  Substance and Sexual Activity   Alcohol use: Yes    Comment: occasionally    Drug use: Not  on file   Sexual activity: Not on file  Other Topics Concern   Not on file  Social History Narrative   08/2019 moved to [facility] from [place]     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  All other systems are negative.  Review of Systems  Vital  Signs: There were no vitals taken for this visit.  Physical Exam Imaging: CT ABDOMEN PELVIS W CONTRAST  Result Date: 07/21/2021 CLINICAL DATA:  80 year old gentleman with right colonic mass and right lower quadrant abscess status post drain placement on 06/07/2020 and most recent exchanged and upsized on 07/13/2021 returns to IR for drain evaluation. EXAM: CT ABDOMEN AND PELVIS WITH CONTRAST TECHNIQUE: Multidetector CT imaging of the abdomen and pelvis was performed using the standard protocol following bolus administration of intravenous contrast. CONTRAST:  166mL ISOVUE-300 IOPAMIDOL (ISOVUE-300) INJECTION 61% COMPARISON:  07/09/2021 FINDINGS: Lower chest: Minimal bibasilar atelectasis. Hepatobiliary: 1.5 cm simple cyst again seen in the left liver lobe. Liver, gallbladder, and common bile duct is otherwise normal. Pancreas: Unremarkable. No pancreatic ductal dilatation or surrounding inflammatory changes. Spleen: Normal in size without focal abnormality. Adrenals/Urinary Tract: Adrenal glands are unremarkable. No significant abnormality of the kidneys or ureters. Limited evaluation of the bladder due to decompression. Diffuse bladder wall thickening most likely due to outlet obstruction given markedly enlarged prostate. Stomach/Bowel: Right: Masses unchanged from prior examination. No bowel dilatation to indicate ileus or obstruction. Right lower quadrant abscess drain is again seen. No significant residual abscess cavity is identified. Vascular/Lymphatic: No significant vascular findings are present. No enlarged abdominal or pelvic lymph nodes. Reproductive: Markedly enlarged nodular prostate. Other: Small amount of free fluid again noted in the pelvic floor. Musculoskeletal: No acute or significant osseous findings. IMPRESSION: Right lower quadrant abscess drain is unchanged in position. No significant residual abscess cavity is identified. Electronically Signed   By: Miachel Roux M.D.   On: 07/21/2021  14:33   CT ABDOMEN PELVIS W CONTRAST  Result Date: 07/09/2021 CLINICAL DATA:  Abdominal abscess/infected suspected, fluid leaking around drain placed at site of prior RIGHT iliacus abscess EXAM: CT ABDOMEN AND PELVIS WITH CONTRAST TECHNIQUE: Multidetector CT imaging of the abdomen and pelvis was performed using the standard protocol following bolus administration of intravenous contrast. CONTRAST:  155mL OMNIPAQUE IOHEXOL 300 MG/ML  SOLN IV COMPARISON:  06/22/2021 FINDINGS: Lower chest: Minimal bibasilar atelectasis Hepatobiliary: Cyst centrally LEFT lobe liver 12 mm greatest size. Tiny RIGHT lobe liver cyst 4 mm. Gallbladder and liver otherwise unremarkable Pancreas: Normal appearance Spleen: Normal appearance Adrenals/Urinary Tract: Tiny LEFT renal cyst unchanged. Adrenal glands, kidneys, and ureters otherwise normal appearance. Bladder wall appears thickened likely related to decompressed state. Stomach/Bowel: Stomach decompressed. Wall thickening of cecum adjacent to RIGHT lower quadrant abscess. Remaining bowel loops unremarkable. Vascular/Lymphatic: Atherosclerotic calcifications aorta and iliac arteries without aneurysm. Vascular structures patent. No adenopathy. Reproductive: Prostatic enlargement. Other: Scattered mild ascites. Diffuse soft tissue edema. No free air or hernia. Pigtail drainage catheter identified RIGHT lower quadrant at RIGHT iliacus with small residual fluid collection  2.7 x 2.2 x 2.2 cm. 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 Minimal degenerative disc and facet disease changes lumbar spine. IMPRESSION: Pigtail drainage catheter at RIGHT lower quadrant with small residual fluid collection at site of prior abscess measuring 2.7 x 2.2 x 2.2 cm. Wall thickening of adjacent cecum, not significantly changed, could be related to neoplasm or be inflammatory in origin; follow-up colonoscopy recommended.  Scattered mild ascites and diffuse soft tissue edema. No new abscess collections identified. Aortic Atherosclerosis (ICD10-I70.0). Electronically Signed   By: Lavonia Dana M.D.   On: 07/09/2021 17:19   CT ABDOMEN PELVIS W CONTRAST  Result Date: 06/23/2021 CLINICAL DATA:  Pericecal/retroperitoneal abscess, post percutaneous drainage catheter placement on 06/07/2021 EXAM: CT ABDOMEN AND PELVIS WITH CONTRAST TECHNIQUE: Multidetector CT imaging of the abdomen and pelvis was performed using the standard protocol following bolus administration of intravenous contrast. CONTRAST:  11mL OMNIPAQUE IOHEXOL 350 MG/ML SOLN COMPARISON:  CT abdomen pelvis-06/19/2021; 06/06/2021; 09/13/2019; 09/05/2019 CT-guided per drainage catheter placement-06/07/2021; 09/14/2019 Drainage catheter injection and up sizing-08/22/2020 FINDINGS: Lower chest: Limited visualization of the lower thorax demonstrates minimal bibasilar subsegmental atelectasis/collapse, left greater than right. No discrete focal airspace opacities. No pleural effusion Normal heart size. Coronary artery calcifications. Calcifications involving the aortic valve leaflets. No pericardial effusion. Hepatobiliary: Normal hepatic contour. Subcentimeter hypoattenuating lesion within the dome of the left lobe of the liver (image 20, series 3), as well as the tiny (approximately 4 mm) hypoattenuating lesion within the caudal aspect of the right lobe of the liver (image 33, series 3), are both too small to accurately characterize though both unchanged compared to remote abdominal CT performed 08/2019. No discrete worrisome hepatic lesions. Normal appearance of the gallbladder given degree distention. No radiopaque gallstones. No intra or extrahepatic biliary dilatation. No ascites. Pancreas: Normal appearance of the pancreas. Spleen: Normal appearance of the spleen. Adrenals/Urinary Tract: There is symmetric enhancement and excretion of the bilateral kidneys. Subcentimeter  hypoattenuating renal lesions are too small to adequately characterize though favored to represent renal cysts. No definite evidence of nephrolithiasis on this postcontrast examination. No urine obstruction or perinephric stranding. Normal appearance of the bilateral adrenal glands. Mild diffuse thickening the urinary bladder wall, unchanged. Stomach/Bowel: Redemonstrated masslike thickening involving the cecum and proximal ascending colon. Moderate colonic stool burden without evidence of enteric obstruction. No pneumoperitoneum, pneumatosis or portal venous gas. Stable positioning of right lower abdominal quadrant percutaneous drainage catheter with near complete resolution of the right iliac CIS and iliopsoas intramuscular abscess with tiny component extending about the anterior aspect of the proximal femur measuring approximately 2.1 x 2.3 cm (image 83, series 3), though now appears predominantly phlegmonous. There is a small amount of free fluid within the pelvic cul-de-sac without peripheral wall enhancement to suggest definable/drainable fluid collection. Vascular/Lymphatic: Moderate amount of atherosclerotic plaque within a normal caliber abdominal aorta, not resulting in hemodynamically significant stenosis. The major branch vessels of the abdominal aorta appear patent on this non CTA examination. No definitive bulky retroperitoneal, mesenteric, pelvic or inguinal lymphadenopathy. Reproductive: Prostatomegaly with mass effect on the undersurface of the urinary bladder. Other: Mild diffuse body wall anasarca Musculoskeletal: No acute or aggressive osseous abnormalities. Mild multilevel lumbar spine DDD, worse at L2-L3 and L3-L4 with disc space height loss, endplate irregularity and sclerosis. Mild-to-moderate degenerative change the bilateral hips, right greater than left. Note is made of a right-sided os acetabuli. IMPRESSION: 1. Interval reduction/near resolution of right lower abdominal/pelvic iliacus and  iliopsoas musculature abscess with tiny component remaining about the proximal anterior aspect of the right femur measuring approximately 3.1 cm in diameter though now appears predominantly phlegmonous. 2. Small amount of free fluid the pelvic cul-de-sac without new definable/drainable fluid collection within the abdomen or pelvis. 3. Masslike thickening involving the cecum and ascending colon, nonspecific though worrisome for malignancy. No evidence of enteric obstruction. Further evaluation with colonoscopy after the resolution of acute symptoms is advised. 4. Marked prostatomegaly with mass effect on the undersurface of the urinary bladder. If not recently performed, further evaluation with DRE is advised. 5.  Aortic Atherosclerosis (ICD10-I70.0). PLAN: - Recommend maintaining the drainage catheter to a JP bulb, continued b.i.d. flushing with 10 cc normal saline and diligent records regarding daily drainage catheter output - Assuming the patient's interval discharge, the patient may follow-up at the interventional radiology drain clinic next week (week of November 14) for IV only contrast CT of the abdomen and pelvis, evaluation and management and drainage catheter injection. Electronically Signed   By: Sandi Mariscal M.D.   On: 06/23/2021 08:43   IR Catheter Tube Change  Result Date: 07/13/2021 INDICATION: 80 year old gentleman with fluid leaking around right lower quadrant abscess drain returns to IR for exchange and possible upsize. Patient most recently upsized to 65 [place] drain on 06/22/2021. EXAM: Fluoroscopy guided exchange and upsize of right pelvic abscess drain MEDICATIONS: None ANESTHESIA/SEDATION: None COMPLICATIONS: None immediate. PROCEDURE: Informed written consent was obtained from the patient after a thorough discussion of the procedural risks, benefits and alternatives. All questions were addressed. Maximal Sterile Barrier Technique was utilized including caps, mask, sterile gowns, sterile  gloves, sterile drape, hand hygiene and skin antiseptic. A timeout was performed prior to the initiation of the procedure. Scout image demonstrated the existing 14 French drain in appropriate position, this was confirmed by administering contrast through the drain under fluoroscopy. The existing drain was cut and removed over a 0.035 inch guidewire and replaced with a 16 [place] multipurpose pigtail drain. Contrast administered through the new drain confirmed appropriate positioning under fluoroscopy. The drain was secured to skin with suture and connected to bulb suction. IMPRESSION: Existing 14 French right pelvic abscess drain exchanged and upsized to 60 [place] for treatment of leakage around catheter. PLAN: I requested that the patient start flushing the drain with 5 mL saline daily in hopes that the drain will not clogged which in turn will decrease the amount of drainage around the catheter. The patient was train by our PA on how to flush the drain. Electronically Signed   By: Miachel Roux M.D.   On: 07/13/2021 15:47   IR Catheter Tube Change  Result Date: 06/22/2021 INDICATION: 80 year old with a right colonic mass and large retroperitoneal abscess with existing drain. Patient is having persistent fluid leakage around the drainage catheter. EXAM: 1. Drain injection 2. Drain exchange with up sizing MEDICATIONS: Local anesthetic, 1% lidocaine ANESTHESIA/SEDATION: None FLUOROSCOPY TIME:  48 seconds, 2 mGy CONTRAST:  15 mL Omnipaque 213 COMPLICATIONS: None immediate. PROCEDURE: Informed written consent was obtained from the patient after a thorough discussion of the procedural risks, benefits and alternatives. All questions were addressed. A timeout was performed prior to the initiation of the procedure. Patient was placed supine. The right lower abdomen and existing drain were prepped and draped in sterile fashion. Maximal barrier sterile technique was utilized including caps, mask, sterile gowns, sterile  gloves, sterile drape, hand hygiene and skin antiseptic. Contrast was injected through the drain. The drain was cut and  removed over a Bentson wire. A new 14 French pigtail drain was easily advanced over the wire and placed within the large abscess cavity. Approximately 100 mL of yellow purulent fluid was aspirated from the collection. Gas and bloody fluid was aspirated after all of the yellow fluid was removed. Skin around the catheter was anesthetized with 1% lidocaine and the catheter was sutured to skin with Prolene suture. FINDINGS: Large residual abscess cavity in the right lower abdomen corresponding with the previous CT findings. Approximately 100 mL of predominantly yellow fluid was removed along with a large amount of gas. 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 IMPRESSION: 1. Successful exchange and up sizing of the right lower abdominal abscess drain. The patient now has a 14 [place] drain. Electronically Signed   By: Markus Daft M.D.   On: 06/22/2021 09:41   DG Sinus/Fist Tube Chk-Non GI  Result Date: 07/21/2021 INDICATION: 80 year old gentleman with history of perforated right colon mass status post abscess drain placement on 06/06/2021 and drain upsized most recently on 07/13/2021 returns to IR for abscessogram. Patient reports approximately 25-30 mL daily output. EXAM: Fluoroscopic abscessogram MEDICATIONS: None ANESTHESIA/SEDATION: None COMPLICATIONS: None immediate. PROCEDURE: Informed written consent was obtained from the patient after a thorough discussion of the procedural risks, benefits and alternatives. A timeout was performed prior to the initiation of the procedure. Scout image demonstrates right lower quadrant abscess drain in unchanged position. Contrast administered through the drain under fluoroscopy showed minimal residual cavity. Drain flushed and reattached to bag. IMPRESSION: Right lower quadrant abscessogram demonstrates minimal  residual cavity. 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 Therefore the drain was left in place. Electronically Signed   By: Miachel Roux M.D.   On: 07/21/2021 14:36   IR Radiologist Eval & Mgmt  Result Date: 07/21/2021 Please refer to notes tab for details about interventional procedure. (Op Note)   Labs:  CBC: Recent Labs    06/22/21 0301 06/23/21 0136 06/24/21 0156 07/09/21 1329  WBC 14.6* 9.0 7.0 6.5  HGB 8.1* 8.3* 8.0* 9.5*  HCT 25.5* 26.7* 25.6* 31.3*  PLT 476* 392 PLATELET CLUMPS NOTED ON SMEAR, UNABLE TO ESTIMATE 279    COAGS: Recent Labs    10/19/20 1350 06/19/21 1758  INR 1.1 1.4*  APTT 38*  --     BMP: Recent Labs    06/22/21 0301 06/23/21 0136 06/24/21 0156 07/09/21 1329  NA 134* 133* 134* 136  K 3.5 3.7 3.5 4.2  CL 101 101 102 104  CO2 25 24 27 25   GLUCOSE 99 88 107* 96  BUN 6* 7* 6* 6*  CALCIUM 7.8* 7.7* 7.7* 8.8*  CREATININE 0.63 0.60* 0.69 0.67  GFRNONAA >60 >60 >60 >60    LIVER FUNCTION TESTS: Recent Labs    06/14/21 0539 06/19/21 1758 06/24/21 0156 07/09/21 1329  BILITOT 0.6 1.1 0.2* 0.7  AST 39 21 44* 22  ALT 40 21 33 22  ALKPHOS 73 69 52 66  PROT 6.1* 6.0* 4.8* 5.8*  ALBUMIN 2.2* 2.1* 1.6* 2.4*    TUMOR MARKERS: No results for input(s): AFPTM, CEA, CA199, CHROMGRNA in the last 8760 hours.  Assessment and Plan:  80 year old gentleman with history of perforated right colon mass status post abscess drain placement on 06/06/2021 and most recently upsized to 14 [place] on 07/13/2021 for treatment of leakage around drain entry site.    He denies fever,  chills, or significant abdominal pain.  He reports no further drainage around catheter entry site.  He reports approximately 25-30 mL of daily output.  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    Abscessogram and CT demonstrated near complete resolution of the abscess.  However given  the daily output, decision was made to leave the drain in place for 2 additional weeks.   Plan: Return in 2 weeks for abscessogram and possible drain removal.     Thank you for this interesting consult.  I greatly enjoyed meeting Avala and look forward to participating in their care.  A copy of this report was sent to the requesting provider on this date.  Electronically Signed: Paula Libra Adelin Ventrella 07/21/2021, 3:27 PM   I spent a total of    15 Minutes in face to face in clinical consultation, greater than 50% of which was counseling/coordinating care for right lower quadrant abscess drain.

## 2021-07-29 ENCOUNTER — Other Ambulatory Visit: Payer: Medicare Other | Admitting: Hospice

## 2021-07-29 ENCOUNTER — Other Ambulatory Visit: Payer: Self-pay

## 2021-07-29 DIAGNOSIS — K6389 Other specified diseases of intestine: Secondary | ICD-10-CM

## 2021-07-29 DIAGNOSIS — E43 Unspecified severe protein-calorie malnutrition: Secondary | ICD-10-CM | POA: Diagnosis not present

## 2021-07-29 DIAGNOSIS — R531 Weakness: Secondary | ICD-10-CM | POA: Diagnosis not present

## 2021-07-29 DIAGNOSIS — Z515 Encounter for palliative care: Secondary | ICD-10-CM

## 2021-07-29 NOTE — Progress Notes (Signed)
Albert Love Consult Note Telephone: [phone]  Fax: [phone]  PATIENT NAME: Albert Love [address] [place] Love [address] [phone] (home)  DOB: Apr 27, 1941 MRN: [id number]  PRIMARY CARE PROVIDER:    Gladys Damme, MD,  [place]. [place] [address] [phone]  REFERRING PROVIDER:   Gladys Damme, MD [address],   [address] [phone]  RESPONSIBLE PARTY:   Self Contact Information     Name Relation Home Work Mobile   Di Love   [phone]   Fox,Albert Granddaughter   [phone]     TELEHEALTH VISIT STATEMENT Due to the COVID-19 crisis, this visit was done via telemedicine from my office and it was initiated and consent by this patient and or family. Video-audio (telehealth) contact was unable to be done due to technical barriers from the patients side. I connected with patient by a telephone  and verified that I am speaking with the correct person. I discussed the limitations of evaluation and management by telemedicine. The patient expressed understanding and agreed to proceed.   Palliative Care was asked to follow this patient by consultation request of  Gladys Damme, MD to address advance care planning, complex medical decision making and goals of care clarification. This is the initial visit.    ASSESSMENT AND / RECOMMENDATIONS:   Advance Care Planning: Our advance care planning conversation included a discussion about:    The value and importance of advance care planning  Difference between Hospice and Palliative care Exploration of goals of care in the event of a sudden injury or illness  Identification and preparation of a healthcare agent  Review and updating or creation of an  advance directive document . Decision not to resuscitate or to de-escalate disease focused treatments due to poor prognosis.  CODE STATUS: Patient affirmed  he is a Do Not Resuscitate  Goals of Care: Goals include to maximize quality of life and symptom management.  Patient does not want any heroic or aggressive measures.  MOST selections in epic indicate comfort measures, determine use of antibiotics, IV fluids for defined trial period, no feeding tube.  His spiritual values give him comfort and hope; he does not want to live on if  His creator says it is time for him to go home.   Visit consisted of counseling and education dealing with the complex and emotionally intense issues of symptom management and palliative care in the setting of serious and potentially life-threatening illness. Palliative care team will continue to support patient, patient's family, and medical team.  I spent 20 minutes providing this initial consultation. More than 50% of the time in this consultation was spent on counseling patient and coordinating communication. 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  No chemo or radiation, comfort measures only, per patient's wishes.  No medications at this time.  Weakness:Likely related to the colonic mass.  Balance of rest and performance activity discussed as well as safety precautions.  He is ambulatory without assistive device.  Encouraged use of rolling walker for support and to help prevent a fall. Severe protein caloric malnutrition: With ongoing weight loss current weight (self-reported) 124 Ibs down from 180 Ibs about a year ago. Height 5 feet 11 inches. BMI 17.29.  Albumin 2.4, total protein 5.8 as had 07/09/2021. He reports his appetite is improving.  Encouraged adequate oral intake, reduce red meat, incorporate fish, white meat, fruits and vegetables into his  diet.  He said his diet was mainly of chicken and red meat; he verbalized understanding and commitment to implement discussed lifestyle modifications.  Follow up: Palliative care will continue to follow for complex medical decision making, advance care planning, and clarification of goals. Return 3 months, per patient request or prn. Encouraged to call provider sooner with any concerns.   Family /Caregiver/Community Supports: Patient lives at home independently.  HOSPICE ELIGIBILITY/DIAGNOSIS: TBD  Chief Complaint: Initial Palliative care visit  HISTORY OF PRESENT ILLNESS:  Albert Love is a 80 y.o. 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  Patient has associated associated weight loss and weakness; weakness is worse on exertion.  Seen in the ED 07/09/2021 for leaking around his JP drain; he reports leaking is resolved.  History of perforated gastric ulcer status post omental Graham patch, Bowel resection, hypertension, hyperlipidemia.  Today, he denies abdominal pain, drain leakage, pain or discomfort.  He reports his JP drain is functioning well.  Physical exam is limited due to telehealth visits.  He is alert and oriented x 3, in no distress and able to engage in discussions.  All his questions were answered about palliative service; he requests to be called again in 3 months. History obtained from review of EMR, discussion with primary team, caregiver, family and/or Mr. Maryland.  Review and summarization of Epic records shows history from other than patient. Rest of 10 point ROS asked and negative.  I reviewed as needed, available labs, patient records, imaging, studies and related documents from the EMR.  ROS General: NAD EYES: denies vision changes ENMT: denies dysphagia Cardiovascular: denies chest pain/discomfort Pulmonary: denies  cough, denies SOB Abdomen: endorses improving fair to good appetite, denies abdominal pain, constipation/diarrhea; reports JP drain in place, patent with no leakage. 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 pylori infection 10/26/2020   Hypertension 10/26/2020   Healthcare maintenance 10/26/2020   Anemia 01/21/2021   Inguinal hernia of right side without obstruction or gangrene 01/21/2021   Colonic mass 06/06/2021   Protein-calorie malnutrition, severe 06/07/2021   Abscess    Constipation    Failure to thrive in adult    Hospice care patient 06/15/2021   Palliative care patient 06/15/2021   Intra-abdominal abscess ([place]) 06/19/2021   Leukocytosis 06/19/2021   Sepsis ([place]) 06/19/2021   Resolved Ambulatory Problems    Diagnosis Date Noted   No Resolved Ambulatory Problems   Past Medical History:  Diagnosis Date   CRAO (central retinal artery occlusion)     SOCIAL HX:  Social History   Tobacco Use   Smoking status: Every Day    Packs/day: 0.50    Types: Cigarettes   Smokeless tobacco: Never  Substance Use Topics   Alcohol use: Yes    Comment: occasionally  FAMILY HX:  Family History  Problem Relation Age of Onset   Colon cancer Neg Hx    Pancreatic cancer Neg Hx    Esophageal cancer Neg Hx       ALLERGIES: No Known Allergies    PERTINENT MEDICATIONS:  Outpatient Encounter Medications as of 07/29/2021  Medication  Sig   atorvastatin (LIPITOR) 80 MG tablet Take 80 mg by mouth daily.   Baclofen 5 MG TABS Take 5 mg by mouth 2 (two) times daily.   famotidine (PEPCID) 10 MG tablet Take 1 tablet (10 mg total) by mouth daily.   feeding supplement (ENSURE ENLIVE / ENSURE PLUS) LIQD Take 237 mLs by mouth 3 (three) times daily between meals.   feeding supplement (ENSURE ENLIVE / ENSURE PLUS) LIQD Take 237 mLs by mouth 2 (two) times daily between meals.   ferrous sulfate 325 (65 FE) MG EC tablet Take 1 tablet (325 mg total) by mouth daily with breakfast.   melatonin 5 MG TABS Take 1 tablet (5 mg total) by mouth at bedtime.   Multiple Vitamin (MULTIVITAMIN WITH MINERALS) TABS tablet Take 1 tablet by mouth daily.   tamsulosin (FLOMAX) 0.4 MG CAPS capsule Take 1 capsule (0.4 mg total) by mouth daily.   No facility-administered encounter medications on file as of 07/29/2021.     Thank you for the opportunity to participate in the care of Mr. Maryland.  The palliative care team will continue to follow. Please call our office at [phone] if we can be of additional assistance.   Note: Portions of this note were generated with [employer]. Dictation errors may occur despite best attempts at proofreading.  Teodoro Spray, NP

## 2021-08-04 ENCOUNTER — Other Ambulatory Visit: Payer: Medicare Other

## 2021-09-01 ENCOUNTER — Ambulatory Visit
Admission: RE | Admit: 2021-09-01 | Discharge: 2021-09-01 | Disposition: A | Discharge status: Home / Self Care | Payer: Medicare Other | Source: Ambulatory Visit | Attending: Physician Assistant | Admitting: Physician Assistant

## 2021-09-01 DIAGNOSIS — Z4682 Encounter for fitting and adjustment of non-vascular catheter: Secondary | ICD-10-CM | POA: Diagnosis not present

## 2021-09-01 DIAGNOSIS — L0291 Cutaneous abscess, unspecified: Secondary | ICD-10-CM

## 2021-09-01 HISTORY — PX: IR RADIOLOGIST EVAL & MGMT: IMG5224

## 2021-09-01 IMAGING — RF DG SINUS / FISTULA TRACT / ABSCESSOGRAM
4 series · 10 of 10 positions shown · non-contrast
Comparison: none

INDICATION: 80-year-old with right colonic mass and retroperitoneal abscess.
Patient had a percutaneous drain placed on [DATE]. Patient
reports minimal output from the drain.

[Series 1: one shot · 0.14mm/px · 1 of 1 slices shown (1 of 2)]
[im 1/1]
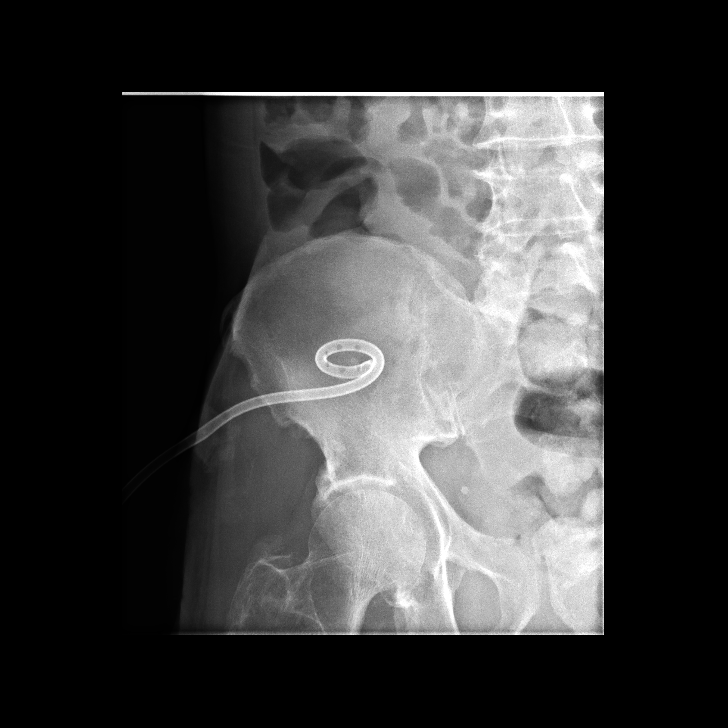

[Series 2: sequence · 4 of 41 frames shown (1 of 2)]
[frame 1/41]
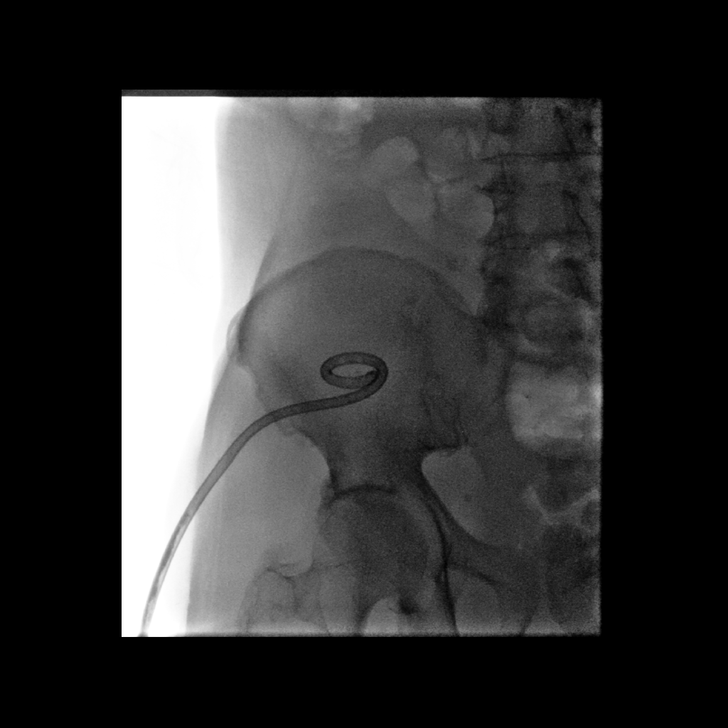
[frame 7/41]
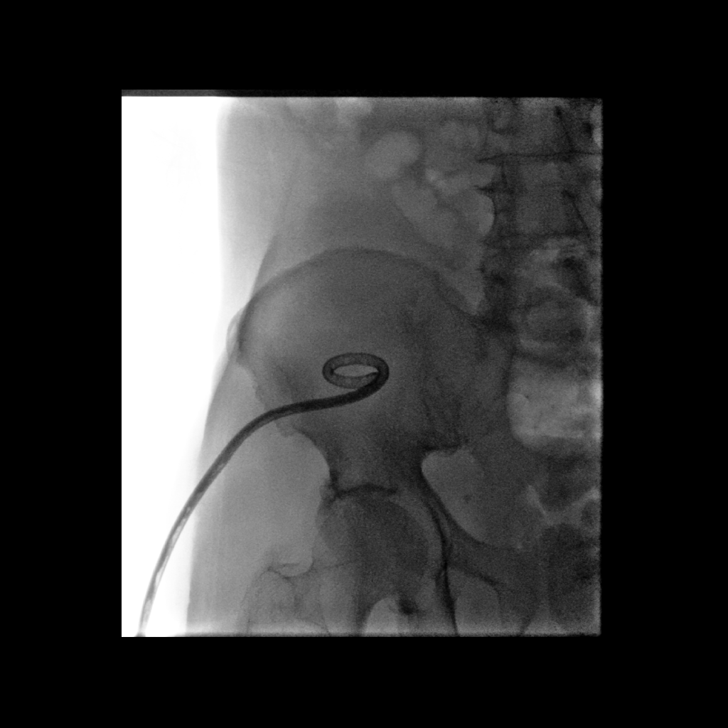
[frame 21/41]
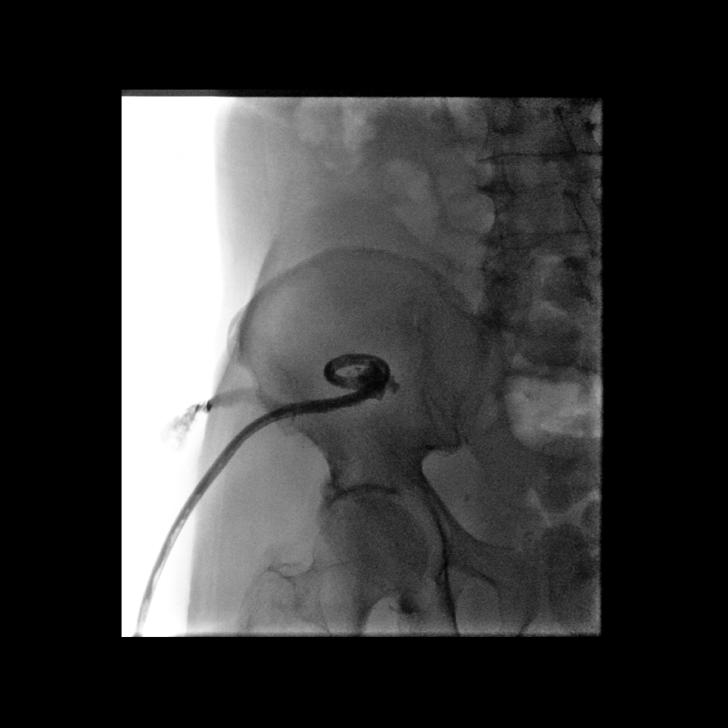
[frame 35/41]
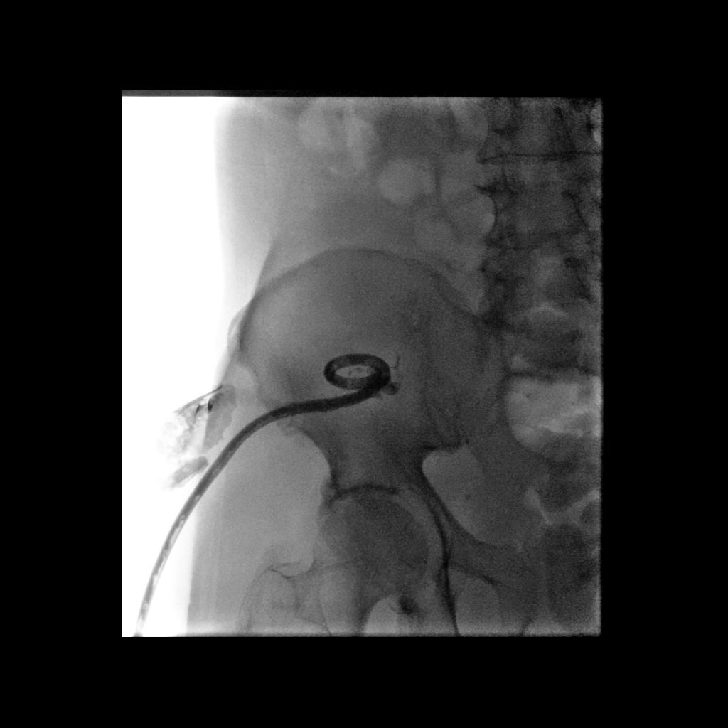

[Series 3: sequence · 3 of 17 frames shown (2 of 2)]
[frame 3/17]
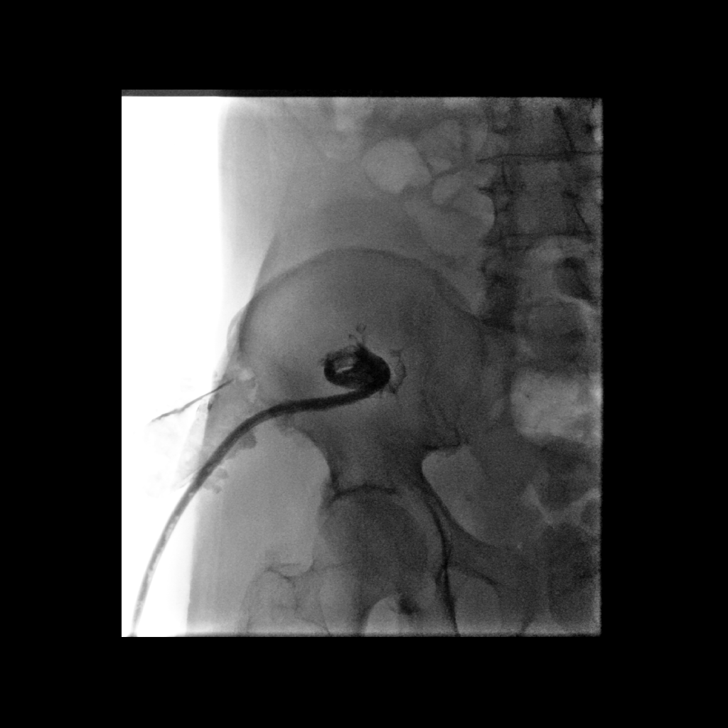
[frame 9/17]
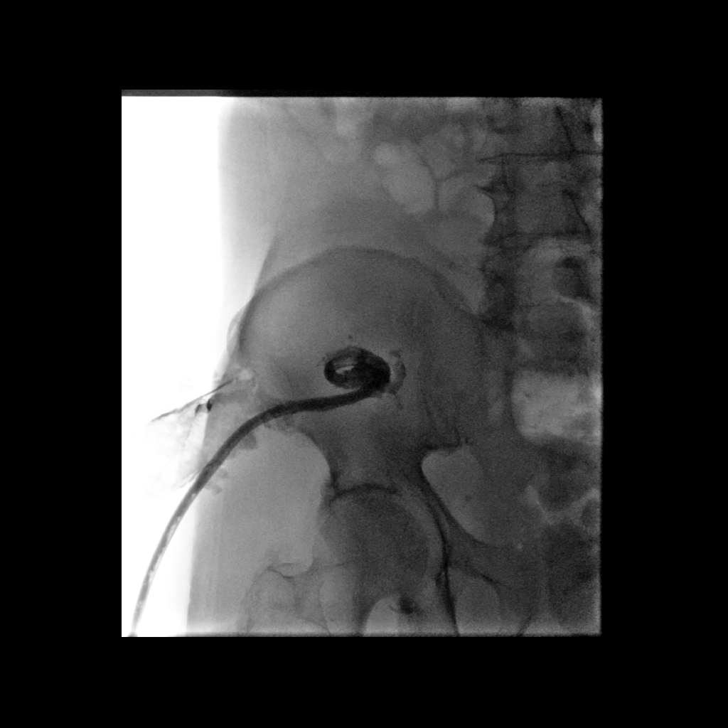
[frame 15/17]
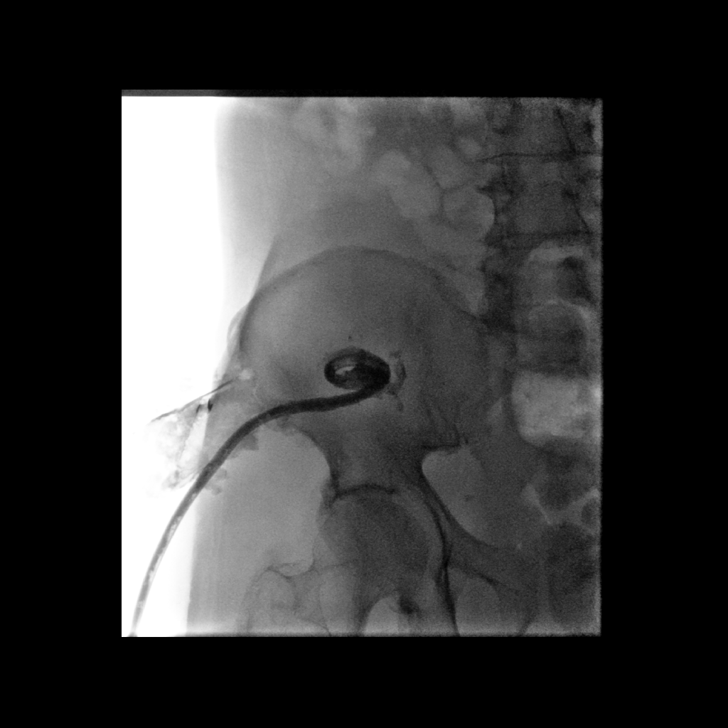

[Series 4: one shot · 0.14mm/px · 2 of 2 slices shown (2 of 2)]
[im 1/2]
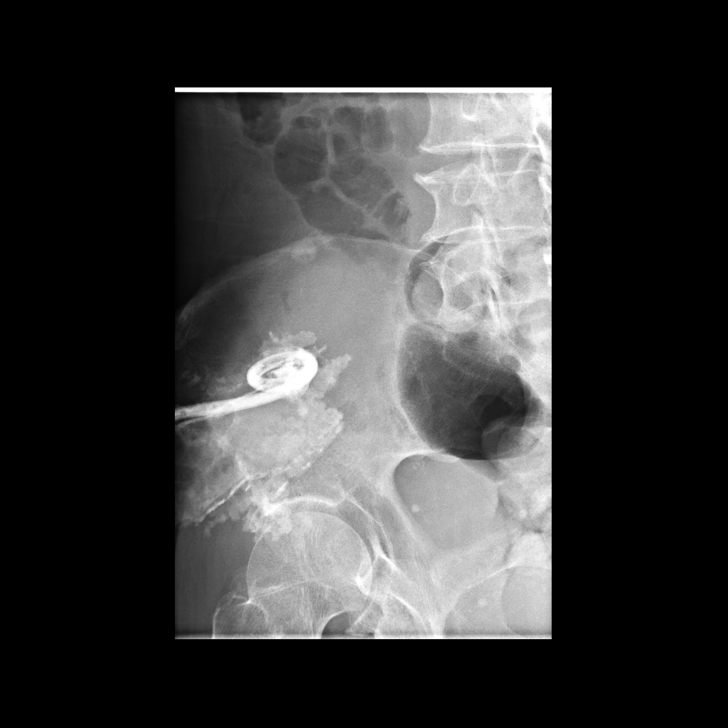
[im 2/2]
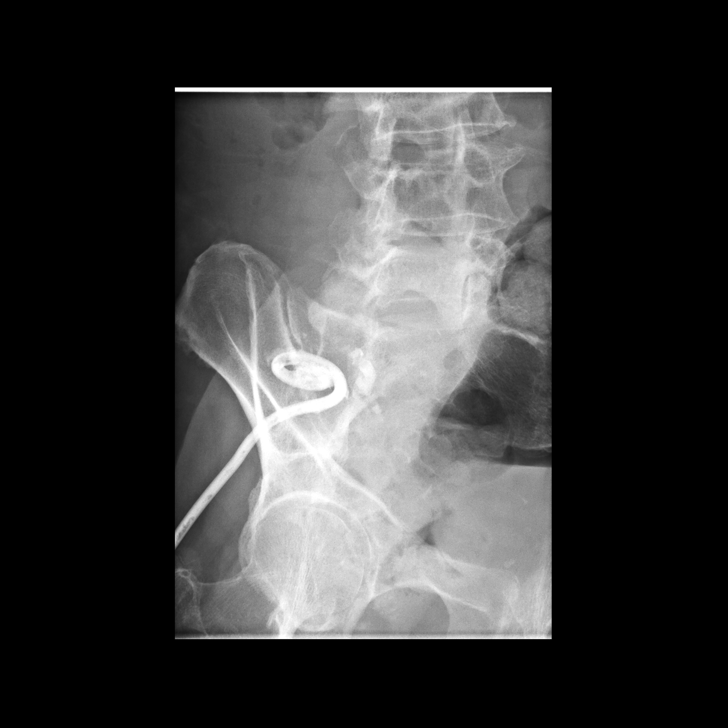

[10 of 10 positions shown; findings below may reference images not displayed]

EXAM:
DRAIN INJECTION WITH FLUOROSCOPY

MEDICATIONS:
None

ANESTHESIA/SEDATION:
None

FLUOROSCOPY TIME:  24 seconds, 1.011 mGy

CONTRAST:  10 mL [ZT]

COMPLICATIONS:
None immediate.

PROCEDURE:
Scout image was obtained of the right lower abdomen. Contrast was
injected under fluoroscopy. Drain was aspirated and flushed with
normal saline after the contrast injection. The retention suture was
cut. The catheter was cut and the entire drain was removed. Bandage
was placed at the drain site.
FINDINGS: Contrast immediately drains around the tube onto the skin surface
during the tube injection. There is a small amount of contrast
around the pigtail portion of the drain but no significant
collection around the drain. There is no evidence for a bowel
fistula.
IMPRESSION: 1. No significant collection around the percutaneous drain. Majority
of the contrast is draining around the tube onto the skin surface.
No evidence for a bowel fistula.
2. Percutaneous drain was removed.

## 2021-09-01 NOTE — Progress Notes (Addendum)
Referring Physician(s): Dr Micael Hampshire NP  Chief Complaint: The patient is seen in follow up today s/p RLQ abscess drain placed in IR 06/07/21  History of present illness:   Rt colonic mass; [name] patch surgery - abscess formation RUQ abscess drain placed in IR 06/07/21. 11/8: drain upsized to 14 f 11/29: leaking at site and drain was exchanged again to 16 f 12/7 injection:  No fistula seen but high output and brown color - fistula was still suspected. Drain was left in place  To come back today for drain injection for evaluation of same  OP in minimal; cloudy fluid Flushes 2-3x/week He denies fever/chills Denies pain Does not follow with [facility]-- inoperable per notes Follows with Palliative Care Last seen 07/29/21--- [place] NP     Past Medical History:  Diagnosis Date   Acute renal insufficiency 09/05/2019   CRAO (central retinal artery occlusion)    Perforated gastric ulcer s/p omental [name] patch 09/05/2019 09/05/2019    Past Surgical History:  Procedure Laterality Date   BOWEL RESECTION N/A 09/05/2019   Procedure: SMALL BOWEL RESECTION;  Surgeon: Clovis Riley, MD;  Location: [facility];  Service: General;  Laterality: N/A;   IR CATHETER TUBE CHANGE  06/22/2021   IR CATHETER TUBE CHANGE  07/13/2021   IR RADIOLOGIST EVAL & MGMT  07/21/2021   IR SINUS/FIST TUBE CHK-NON GI  09/19/2019   IR SINUS/FIST TUBE CHK-NON GI  09/19/2019   LAPAROTOMY N/A 09/05/2019   Procedure: EXPLORATORY LAPAROTOMY WITH [facility] AND GASTRIC BIOPSY;  Surgeon: Clovis Riley, MD;  Location: [facility];  Service: General;  Laterality: N/A;    Allergies: Patient has no known allergies.  Medications: Prior to Admission medications   Medication Sig Start Date End Date Taking? Authorizing Provider  atorvastatin (LIPITOR) 80 MG tablet Take 80 mg by mouth daily.    [provider]  Baclofen 5 MG TABS Take 5 mg by mouth 2 (two) times daily. 06/29/21   Lurline Del, DO  famotidine  (PEPCID) 10 MG tablet Take 1 tablet (10 mg total) by mouth daily. 06/16/21   Holley Bouche, MD  feeding supplement (ENSURE ENLIVE / ENSURE PLUS) LIQD Take 237 mLs by mouth 3 (three) times daily between meals. 06/15/21   Holley Bouche, MD  feeding supplement (ENSURE ENLIVE / ENSURE PLUS) LIQD Take 237 mLs by mouth 2 (two) times daily between meals. 06/29/21   Lurline Del, DO  ferrous sulfate 325 (65 FE) MG EC tablet Take 1 tablet (325 mg total) by mouth daily with breakfast. 05/12/21   Gatha Mayer, MD  melatonin 5 MG TABS Take 1 tablet (5 mg total) by mouth at bedtime. 06/24/21   [name], MD  Multiple Vitamin (MULTIVITAMIN WITH MINERALS) TABS tablet Take 1 tablet by mouth daily. 06/16/21   Holley Bouche, MD  tamsulosin (FLOMAX) 0.4 MG CAPS capsule Take 1 capsule (0.4 mg total) by mouth daily. 06/25/21   [name], MD     Family History  Problem Relation Age of Onset   Colon cancer Neg Hx    Pancreatic cancer Neg Hx    Esophageal cancer Neg Hx     Social History   Socioeconomic History   Marital status: Single    Spouse name: Not on file   Number of children: Not on file   Years of education: Not on file   Highest education level: Not on file  Occupational History   Not on file  Tobacco Use  Smoking status: Every Day    Packs/day: 0.50    Types: Cigarettes   Smokeless tobacco: Never  Substance and Sexual Activity   Alcohol use: Yes    Comment: occasionally    Drug use: Not on file   Sexual activity: Not on file  Other Topics Concern   Not on file  Social History Narrative   08/2019 moved to [facility] from [place]     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  Physical Exam Skin:    Comments: Skin site is clean and dry NT no bleeding No sign of infection  Drain injection still shows no sign of fistula per Dr Charlynn Court removal without complication Dressing placed    Imaging: No results found.  Labs:  CBC: Recent Labs    06/22/21 0301 06/23/21 0136 06/24/21 0156 07/09/21 1329  WBC 14.6* 9.0 7.0 6.5  HGB 8.1* 8.3* 8.0* 9.5*  HCT 25.5* 26.7* 25.6* 31.3*  PLT 476* 392 PLATELET CLUMPS NOTED ON SMEAR, UNABLE TO ESTIMATE 279    COAGS: Recent Labs    10/19/20 1350 06/19/21 1758  INR 1.1 1.4*  APTT 38*  --     BMP: Recent Labs    06/22/21 0301 06/23/21 0136 06/24/21 0156 07/09/21 1329  NA 134* 133* 134* 136  K 3.5 3.7 3.5 4.2  CL 101 101 102 104  CO2 25 24 27 25   GLUCOSE 99 88 107* 96  BUN 6* 7* 6* 6*  CALCIUM 7.8* 7.7* 7.7* 8.8*  CREATININE 0.63 0.60* 0.69 0.67  GFRNONAA >60 >60 >60 >60    LIVER FUNCTION TESTS: Recent Labs    06/14/21 0539 06/19/21 1758 06/24/21 0156 07/09/21 1329  BILITOT 0.6 1.1 0.2* 0.7  AST 39 21 44* 22  ALT 40 21 33 22  ALKPHOS 73 69 52 66  PROT 6.1* 6.0* 4.8* 5.8*  ALBUMIN 2.2* 2.1* 1.6* 2.4*    Assessment:  RLQ abscess drain placed 06/07/21 12/7 drain injection: showed no fistula- but with high output and color of output--- fistula was suspected Drain was left in place Drain injection today shows no fistula Drain removed per Dr Anselm Pancoast Mass is inoperable per chart Follows with Palliative care--- to see them again in February All questions answered to satisfaction   Signed: Lavonia Drafts, PA-C 09/01/2021, 1:47 PM   Please refer to Dr. Anselm Pancoast attestation of this note for management and plan.

## 2021-10-21 ENCOUNTER — Other Ambulatory Visit: Payer: Self-pay

## 2021-10-21 ENCOUNTER — Other Ambulatory Visit: Payer: Self-pay | Admitting: Hospice

## 2021-11-05 ENCOUNTER — Telehealth: Payer: Self-pay

## 2021-11-05 ENCOUNTER — Other Ambulatory Visit: Payer: Self-pay | Admitting: Family Medicine

## 2021-11-05 ENCOUNTER — Ambulatory Visit
Admission: RE | Admit: 2021-11-05 | Discharge: 2021-11-05 | Disposition: A | Discharge status: Home / Self Care | Payer: Medicare Other | Source: Ambulatory Visit | Attending: Family Medicine | Admitting: Family Medicine

## 2021-11-05 DIAGNOSIS — K5720 Diverticulitis of large intestine with perforation and abscess without bleeding: Secondary | ICD-10-CM

## 2021-11-05 DIAGNOSIS — K6389 Other specified diseases of intestine: Secondary | ICD-10-CM | POA: Diagnosis not present

## 2021-11-05 DIAGNOSIS — K3189 Other diseases of stomach and duodenum: Secondary | ICD-10-CM | POA: Diagnosis not present

## 2021-11-05 DIAGNOSIS — Z872 Personal history of diseases of the skin and subcutaneous tissue: Secondary | ICD-10-CM | POA: Diagnosis not present

## 2021-11-05 DIAGNOSIS — C180 Malignant neoplasm of cecum: Secondary | ICD-10-CM | POA: Diagnosis not present

## 2021-11-05 IMAGING — CT CT ABD-PELV W/ CM
2 of 5 series · 11 of 46 positions shown, 12 images · IV contrast (agent unspecified)
Comparison: [DATE] and previous

CLINICAL DATA: Cecal carcinoma. Previous right lower quadrant
abscess, post percutaneous drain placement [DATE], multiple a
removed [DATE]. Skin lesion with drainage from the same region.

EXAM:
CT ABDOMEN AND PELVIS WITH CONTRAST
TECHNIQUE: Multidetector CT imaging of the abdomen and pelvis was performed
using the standard protocol following bolus administration of
intravenous contrast.

[Series 2: abd pelvis 5.00 br40 s3 axial · axial · 0.44mm/px · z∈[+1327,+1647]mm · 8 of 81 slices shown, 9 images]
[im 9/81  soft-tissue]
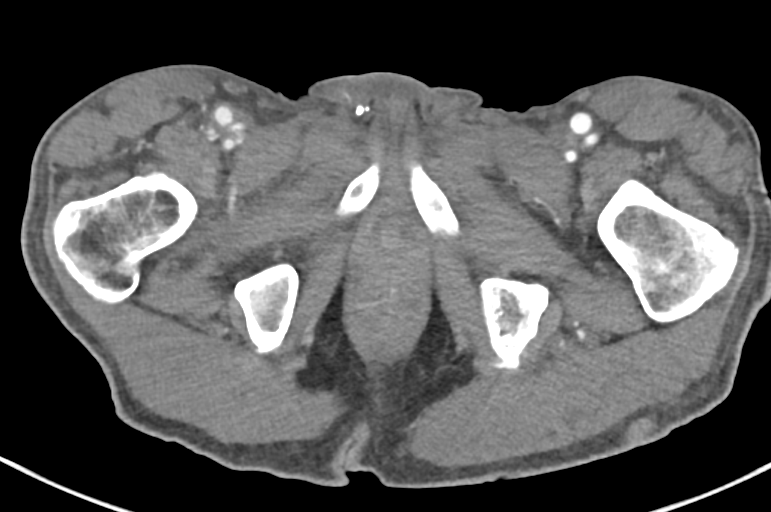
[im 9/81  bone]
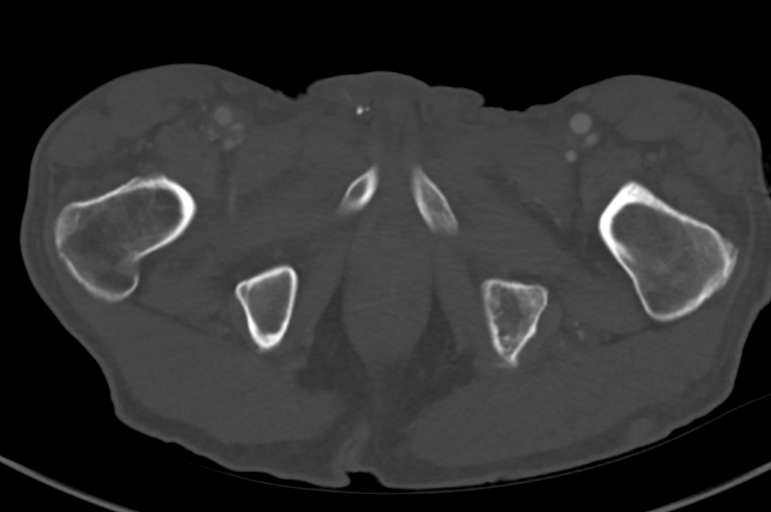
[im 17/81  soft-tissue]
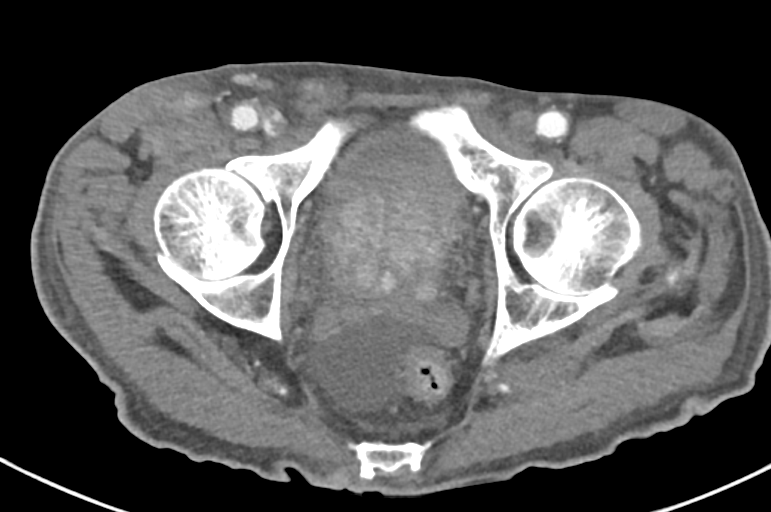
[im 25/81  soft-tissue]
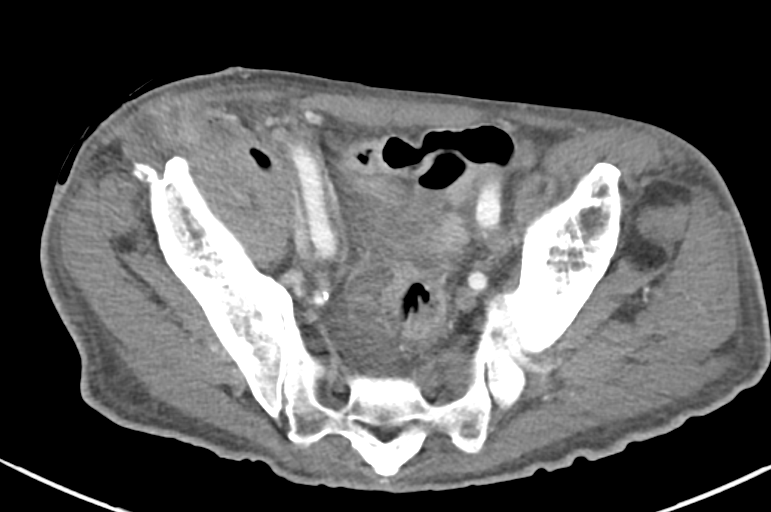
[im 37/81  soft-tissue]
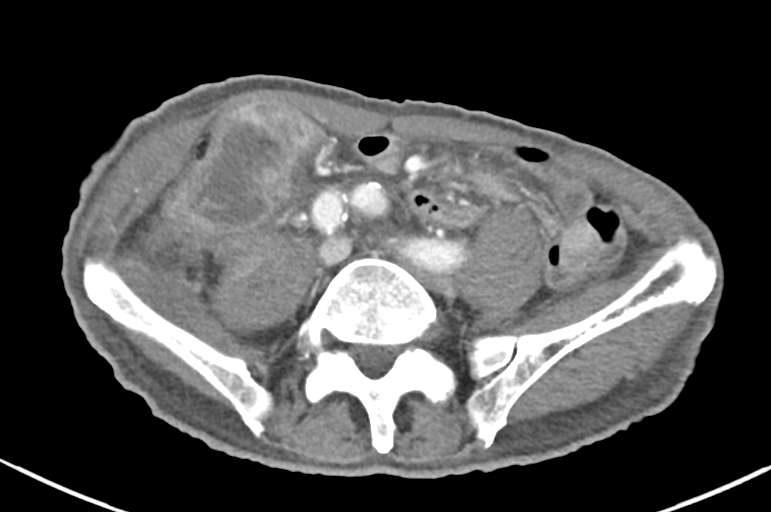
[im 45/81  soft-tissue]
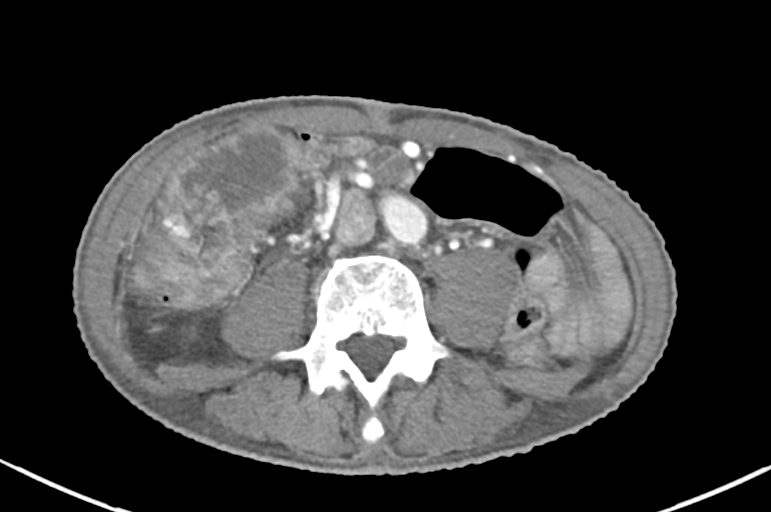
[im 57/81  soft-tissue]
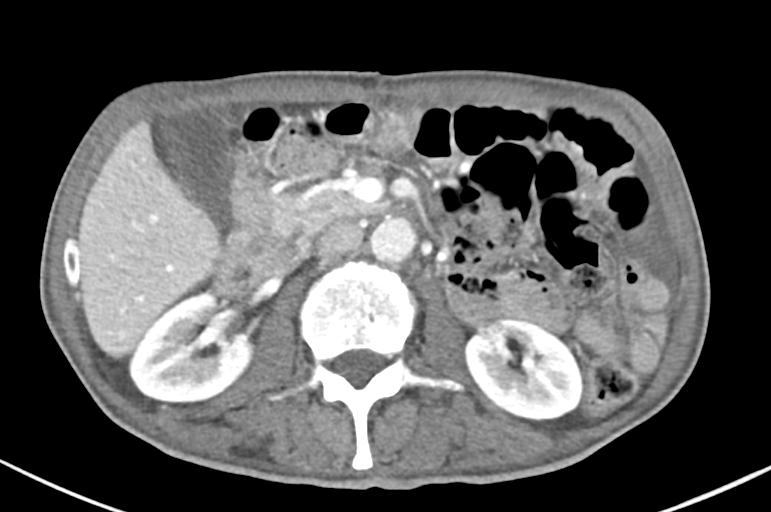
[im 65/81  soft-tissue]
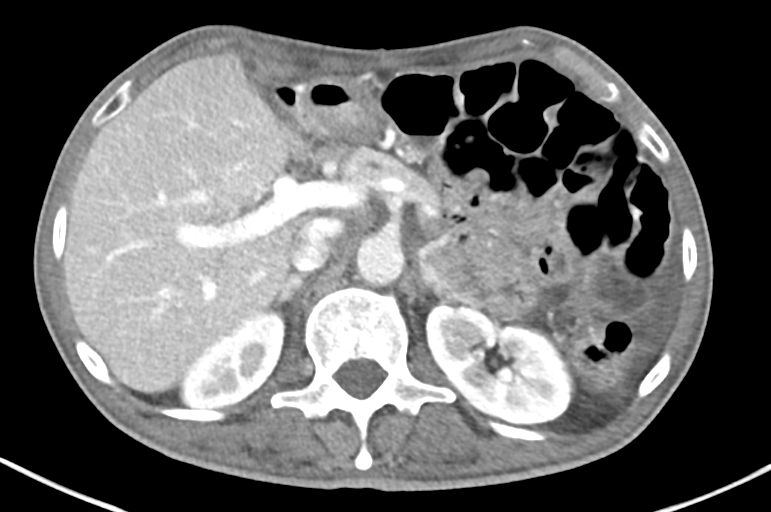
[im 73/81  soft-tissue]
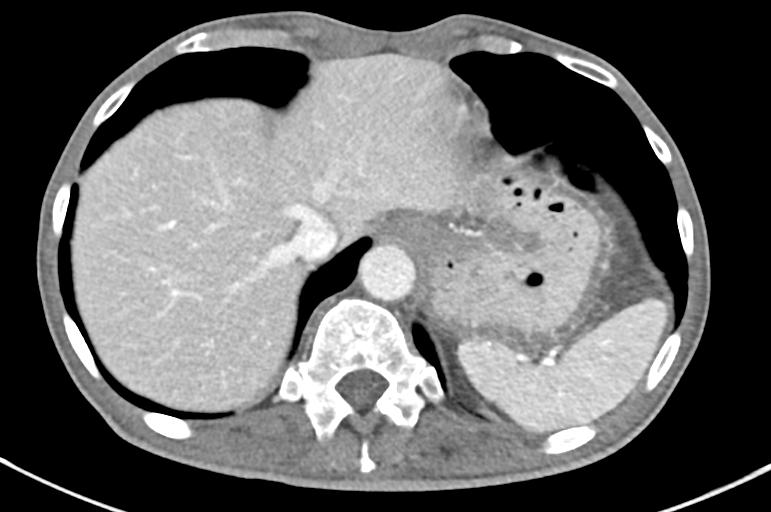

[Series 6: abd pelvis 2.00 br40 s3 cor · coronal · 0.65mm/px · 3 of 110 slices shown]
[im 37/110  soft-tissue]
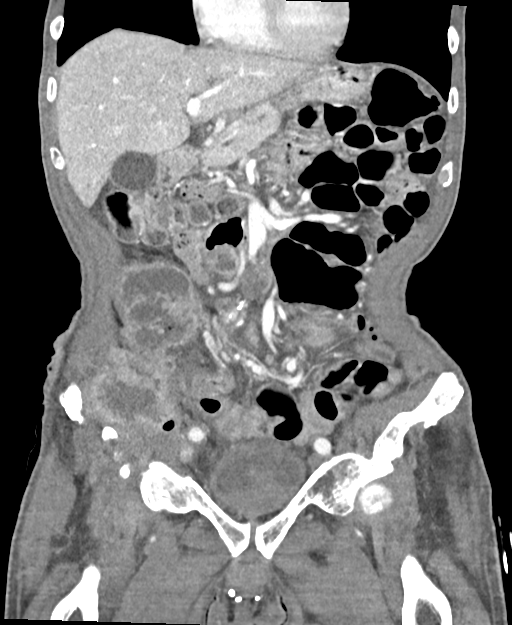
[im 49/110  soft-tissue]
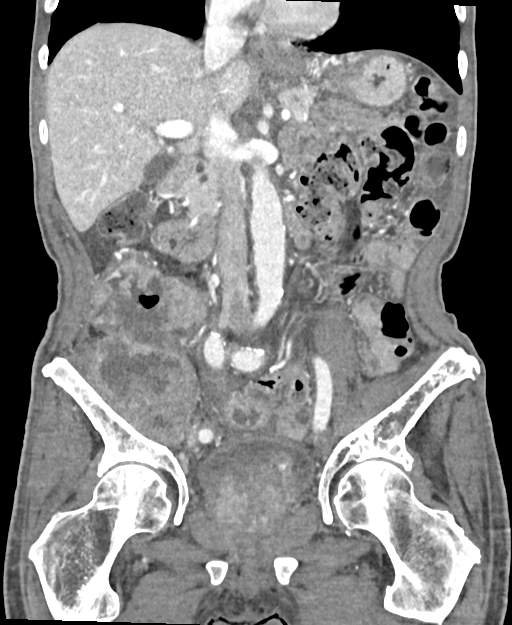
[im 61/110  soft-tissue]
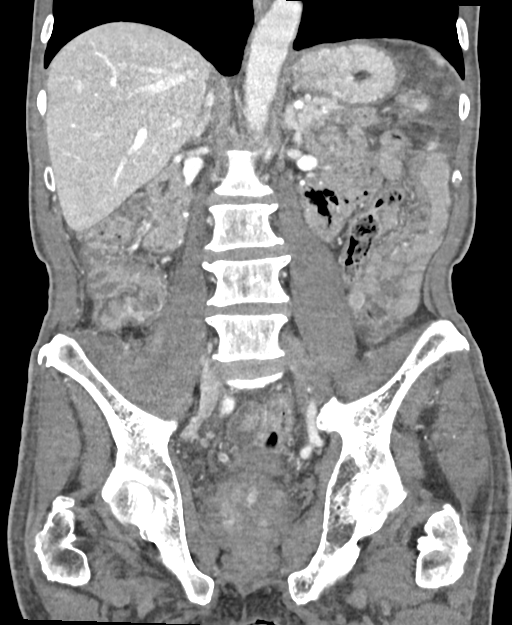

[11 of 46 positions shown; findings below may reference images not displayed]

RADIATION DOSE REDUCTION: This exam was performed according to the
departmental dose-optimization program which includes automated
exposure control, adjustment of the mA and/or kV according to
patient size and/or use of iterative reconstruction technique.

CONTRAST:  100mL [95] IOPAMIDOL ([95]) INJECTION 61%
FINDINGS: Lower chest: No acute abnormality.

Hepatobiliary: Stable 1 cm cyst in hepatic segment 3; no follow-up
required. No new liver lesion. No calcified gallstones. No biliary
ductal dilatation.

Pancreas: Unremarkable. No pancreatic ductal dilatation or
surrounding inflammatory changes.

Spleen: Normal in size without focal abnormality.

Adrenals/Urinary Tract: Adrenal glands are unremarkable. Kidneys are
normal, without renal calculi, focal lesion, or hydronephrosis.
Bladder is unremarkable.

Stomach/Bowel: Stomach and small bowel unremarkable. 13.3 x 9.8 x 6
cm complex process which appears contiguous with the ascending
colon, extending into the soft tissues posteriorly to the psoas, and
laterally to the subcutaneous tissues at the level of the iliac
fossa, along the course of the previous drain catheter. There is
central low attenuation and irregular thickened somewhat nodular
peripheral enhancement. No evidence of bowel obstruction. Hepatic
flexure is unremarkable, and the more distal colon is nondistended.

Vascular/Lymphatic: Progressive right lower quadrant mesenteric
adenopathy up to 2.4 cm (Im35,Se2) medial to the ascending colon
process . Aortoiliac calcified atheromatous plaque without
aneurysm.

Reproductive: Marked prostate enlargement protruding into the lumen
of the urinary bladder.

Other: Small volume pelvic ascites.  No free air.

Musculoskeletal: Bilateral hip DJD.  No acute findings.
IMPRESSION: 1. Interval progression of ascending colon tumor with regional
invasion into right lower quadrant body wall, which likely accounts
for the drainage at the previous catheter site. I do not think
replacing percutaneous catheter would control this adequately.
Consider wound/ostomy nursing consultation for wound control.
2. Progression of mesenteric adenopathy central to the right colon
mass. No evidence of distal metastatic disease.
3.  Aortic Atherosclerosis ([95]-170.0).

## 2021-11-05 MED ORDER — IOPAMIDOL (ISOVUE-300) INJECTION 61%
100.0000 mL | Freq: Once | INTRAVENOUS | Status: AC | PRN
  Administered 2021-11-05: 100 mL via INTRAVENOUS

## 2021-11-05 NOTE — Telephone Encounter (Signed)
Albert Love from [facility] calls nurse line requesting a CT order for patient.  ? ?Albert Love reports he walked into [facility] complaining of fluid leakage from his abdomen.  ? ?Patient has a hx of colonic mass. I spoke with PCP who gave verbal to order CT Abdomen Pelvis with Contrast.  ? ?CT ordered-patient will get today.  ?

## 2021-11-08 ENCOUNTER — Telehealth: Payer: Self-pay | Admitting: *Deleted

## 2021-11-08 NOTE — Telephone Encounter (Signed)
Received voicemail from Dr. Chauncey Reading wanting to verify that patient was still active with [facility] and to give patient update. I returned the call and spoke with Dr. Chauncey Reading to advise that patient is still active with [facility] under our palliative care services. He was seen by our provider, Laverda Sorenson on 07/29/2021 with a plan to follow up in 3 months. Windy Canny is currently out of the office today. MD requests that I have Livina give her a call to coordinate care tomorrow when she returns. Sent a communication to [place] to inform her of this request. MD advised that patient has an appointment with her this week on 11/10/21. ?

## 2021-11-08 NOTE — Telephone Encounter (Signed)
[facility] calls nurse line to give report.  ? ?Full report in epic and this has been received for viewing.  ? ?Will forward to PCP.  ?

## 2021-11-08 NOTE — Telephone Encounter (Signed)
I have attempted to call the patient 4 times since Friday. Left 2 HIPAA compliant VM's on home number. Mobile number does not have VM. I was able to get a hold of him on his mobile number today.  ?It appears on CT a/p that there is an interval worsening of the tumor and it is now in Albert Love body wall. This is likely interval worsening of his tumor causing the leakage. Radiology did not note any obstruction or abscess. Albert Love said he is not in pain, no fever, able to tolerate PO. His biggest concern is that he has to change dressings multiple times a day. He would benefit from seeing an ostomy/wound nurse. I'd like him to have this area seen in person to ensure no infection as well and to address his palliative care/hospice measures. He was enrolled in palliative/hospice with [facility] at time of discharge in November 2022. He may have been lost to follow up, it is unclear. Unfortunately I do not have any available appts this week, so appt made with Dr. Vanessa  on 3/28 for follow up. He will need re-referral to palliative care if he was lost to follow up, ostomy supplies, and referral to wound/ostomy nursing.  ? ?Gladys Damme, MD ?[facility], PGY-3 ? ?

## 2021-11-09 ENCOUNTER — Telehealth: Payer: Self-pay | Admitting: Hospice

## 2021-11-09 DIAGNOSIS — Z515 Encounter for palliative care: Secondary | ICD-10-CM

## 2021-11-09 NOTE — Telephone Encounter (Signed)
NP called PCP as requested and left her a vm with call back number. Patient was a no show for scheduled follow up visit on 10/21/21.  ?

## 2021-11-09 NOTE — Progress Notes (Signed)
? ? ?  SUBJECTIVE:  ? ?CHIEF COMPLAINT / HPI:  ? ?Follow-up CT results-colon cancer ?81 year old male presented today to discuss his CT results.  He states he had issue with his ostomy site healing which was removed in January. CT scan on 11/05/2021 showed interval progression of a ascending colon tumor with regional invasion into the right lower quadrant body wall which accounts for the drainage of the previous catheter site.  Report further suggest consideration for a wound/ostomy nurse in consultation for wound control.  Additionally it shows progression of mesenteric adenopathy central to the right colon mass.  Patient states he has not been following with palliative care that he knows of.  He denies any pain at this time.   ? ?PERTINENT  PMH / PSH: Colon cancer ? ?OBJECTIVE:  ? ?BP 137/87   Pulse 83   Ht '5\' 11"'$  (1.803 m)   Wt 125 lb 12.8 oz (57.1 kg)   SpO2 100%   BMI 17.55 kg/m?   ? ?General: NAD, pleasant, able to participate in exam ?Respiratory: No respiratory distress ?Skin: Ostomy site present on the right side with no current draining and bandage in place.  No ?Neuro: alert, no obvious focal deficits ?Psych: Normal affect and mood ? ?ASSESSMENT/PLAN:  ? ?Colon cancer: ?Discussed CT results with the patient which was done because of draining from his previous ostomy site.  It shows the cancer has progressed.  I discussed this with him in detail including the fact that I feel from medical standpoint he would qualify for hospice.  I discussed options such as home wound care, palliative care, and hospice.  I discussed the role of palliative and hospice can play in conditions such as this.  After further discussion the patient states his primary goal is to remain in his household.  He is interested in hospice particularly if he can be home hospice.  He would also like to have wound/ostomy care in his home.  We will place referral for hospice.  We will also place a CCM referral to help the patient in any  way he can.  We will also order home health nursing for ostomy care. ? ?Lurline Del, DO ?[place]  ? ? ? ?

## 2021-11-10 ENCOUNTER — Ambulatory Visit (INDEPENDENT_AMBULATORY_CARE_PROVIDER_SITE_OTHER): Payer: Medicare Other | Admitting: Family Medicine

## 2021-11-10 ENCOUNTER — Encounter: Payer: Self-pay | Admitting: Family Medicine

## 2021-11-10 VITALS — BP 137/87 | HR 83 | Ht 71.0 in | Wt 125.8 lb

## 2021-11-10 DIAGNOSIS — K6389 Other specified diseases of intestine: Secondary | ICD-10-CM

## 2021-11-10 NOTE — Patient Instructions (Signed)
I placed a referral for home hospice and that should reach out to you in the next week or so.  I also replaced a referral with our CCM/social work group to assist in your care in any way.  If you develop any concerns or if you do not hear from them in the next 1 week please let us know. ?

## 2021-11-15 ENCOUNTER — Telehealth: Payer: Self-pay | Admitting: Hospice

## 2021-11-15 DIAGNOSIS — Z515 Encounter for palliative care: Secondary | ICD-10-CM

## 2021-11-15 NOTE — Telephone Encounter (Signed)
Called patient several times without success. NP called and spoke with Albert Love who confirmed correct tel no. Is [phone]. This number is said to be out of service. Albert Love said he will have patient call NP.  ?

## 2021-11-16 ENCOUNTER — Encounter: Payer: Self-pay | Admitting: *Deleted

## 2021-11-16 ENCOUNTER — Telehealth: Payer: Self-pay | Admitting: *Deleted

## 2021-11-16 NOTE — Chronic Care Management (AMB) (Signed)
?  Care Management  ? ?Outreach Note ? ?11/16/2021 ?Name: Demarrio Menges MRN: [id number] DOB: [birth year] ? ?Referred by: Gladys Damme, MD ?Reason for referral : Care Coordination (Initial outreach to schedule with BSW) ? ? ?An unsuccessful telephone outreach was attempted today. The patient was referred to the case management team for assistance with care management and care coordination.  ? ?Follow Up Plan:  ?A HIPAA compliant phone message was left for the patient providing contact information and requesting a return call.  ?The care management team will reach out to the patient again over the next 7 days.  ?If patient returns call to provider office, please advise to call [place]* at [phone].* ? ?Laverda Sorenson  ?Care Guide, Embedded Care Coordination ?[place]  Care Management  ?Direct Dial: [phone] ? ?

## 2021-11-16 NOTE — Progress Notes (Signed)
A user error has taken place: encounter opened in error, closed for administrative reasons.

## 2021-11-17 NOTE — Chronic Care Management (AMB) (Signed)
?  Care Management  ? ?Outreach Note ? ?11/17/2021 ?Name: Albert Love MRN: [id number] DOB: 1941/05/09 ? ?Referred by: Gladys Damme, MD ?Reason for referral : Care Coordination (Initial outreach to schedule with BSW) ? ? ?A second unsuccessful telephone outreach was attempted today. The patient was referred to the case management team for assistance with care management and care coordination.  ? ?Follow Up Plan:  ?A HIPAA compliant phone message was left for the patient providing contact information and requesting a return call.  ?The care management team will reach out to the patient again over the next 14 days.  ?If patient returns call to provider office, please advise to call [place] * at [phone].* ? ?Laverda Sorenson  ?Care Guide, Embedded Care Coordination ?[place]  Care Management  ?Direct Dial: [phone] ? ?

## 2021-11-18 ENCOUNTER — Encounter: Payer: Self-pay | Admitting: Family Medicine

## 2021-11-18 NOTE — Chronic Care Management (AMB) (Signed)
?  Care Management  ? ?Note ? ?11/18/2021 ?Name: [name] MRN: [id number] DOB: [birth year] ? ?Albert Love is a 81 y.o. year old male who is a primary care patient of Gladys Damme, MD. I reached out to [facility] by phone today offer care coordination services.  ? ?[name] was given information about care management services today including:  ?Care management services include personalized support from designated clinical staff supervised by his physician, including individualized plan of care and coordination with other care providers ?24/7 contact phone numbers for assistance for urgent and routine care needs. ?The patient may stop care management services at any time by phone call to the office staff. ? ?Patient did not agree to enrollment in care management services and does not wish to consider at this time. ? ?Follow up plan: ?The care management team is available to follow up with the patient after provider conversation with the patient regarding recommendation for care management engagement and subsequent re-referral to the care management team.  ? ?Laverda Sorenson  ?Care Guide, Embedded Care Coordination ?[place]  Care Management  ?Direct Dial: [phone] ? ? ?

## 2021-11-22 ENCOUNTER — Encounter: Payer: Self-pay | Admitting: Family Medicine

## 2021-11-22 ENCOUNTER — Ambulatory Visit (INDEPENDENT_AMBULATORY_CARE_PROVIDER_SITE_OTHER): Payer: Medicare Other | Admitting: Family Medicine

## 2021-11-22 VITALS — BP 126/76 | HR 87 | Ht 71.0 in | Wt 122.0 lb

## 2021-11-22 DIAGNOSIS — I10 Essential (primary) hypertension: Secondary | ICD-10-CM | POA: Diagnosis not present

## 2021-11-22 DIAGNOSIS — K651 Peritoneal abscess: Secondary | ICD-10-CM | POA: Diagnosis not present

## 2021-11-22 DIAGNOSIS — C762 Malignant neoplasm of abdomen: Secondary | ICD-10-CM

## 2021-11-22 DIAGNOSIS — E43 Unspecified severe protein-calorie malnutrition: Secondary | ICD-10-CM

## 2021-11-22 MED ORDER — ONDANSETRON 8 MG PO TBDP: 8.0000 mg | ORAL_TABLET | Freq: Three times a day (TID) | ORAL | 0 refills | Status: DC | PRN

## 2021-11-22 MED ORDER — OXYCODONE-ACETAMINOPHEN 10-325 MG PO TABS: 1.0000 | ORAL_TABLET | Freq: Three times a day (TID) | ORAL | 0 refills | Status: AC | PRN

## 2021-11-22 NOTE — Assessment & Plan Note (Signed)
Patient has had acceleration of weight loss in last 2 weeks, also has low appetite.  Discussed importance of trying to eat 3 meals a day and can supplement with Ensure in between meals for more protein.  Gave patient coupon for Ensure.  Can also try to scoop/tablespoons of Carnation instant breakfast and a glass of milk. Follow up 1 month/ as needed ?

## 2021-11-22 NOTE — Assessment & Plan Note (Signed)
No collection noted on CT at end of March, will treat site of former drain with ostomy supplies see above. ?

## 2021-11-22 NOTE — Progress Notes (Signed)
? ? ?  SUBJECTIVE:  ? ?CHIEF COMPLAINT / HPI:  ? ?Follow up of colon ca: Patient seen about 2 weeks ago for complications at former drain site.  Back in November 22 patient was diagnosed with an abscess due to a nonoperable malignant tumor of the colon.  He had a JP drain placed that was later removed.  He had had worsening interval drainage from the site of drain placement which never fully healed at the end of March and requested repeat CT.  CT a/p showed interval worsening of colon cancer, now ascending into body wall. Patient decided to do home hospice at that time. Since then he reports he has been in contact with home hospice, his goal is to remain as comfortable as possible.  He has not had any pain associated with the drain site, but does note that he sometimes has "stomach" cramps-abdominal cramping pains-and often does not feel hungry.  From November at time of diagnosis until March patient had lost about 15 total pounds, however he has lost 3 pounds in the 2 weeks since his last appointment.  He would like help with supplies as he has liquid draining from the drain site and it increasing need to change dressing to about 3 times daily.  He has no fever, no pain today, 1 episode of nausea last night, no problem with bowel movements.  Patient explicitly states that his main goal is to be comfortable. ? ?PERTINENT  PMH / PSH: CRAO, colon cancer ? ?OBJECTIVE:  ? ?BP 126/76   Pulse 87   Ht '5\' 11"'$  (1.803 m)   Wt 122 lb (55.3 kg)   SpO2 99%   BMI 17.02 kg/m?   ?Nursing note and vitals reviewed ?GEN: cachectic, AAM, resting comfortably in chair, NAD, WNWD ?HEENT: NCAT. PERRLA. Sclera without injection or icterus. MMM. Temporal waisting. ?Cardiac: Regular rate and rhythm. Normal S1/S2. No murmurs, rubs, or gallops appreciated. 2+ radial pulses. ?Lungs: Clear bilaterally to ascultation. No increased WOB, no accessory muscle usage. No w/r/r. ?Abdomen: Soft, NT, ND. Normoactive bowel sounds. Fluid drainage from  site in RLQ, skin intact ?Neuro: AOx3  ?Ext: no edema ?Psych: Pleasant and appropriate  ? ?ASSESSMENT/PLAN:  ? ?Protein-calorie malnutrition, severe ?Patient has had acceleration of weight loss in last 2 weeks, also has low appetite.  Discussed importance of trying to eat 3 meals a day and can supplement with Ensure in between meals for more protein.  Gave patient coupon for Ensure.  Can also try to scoop/tablespoons of Carnation instant breakfast and a glass of milk. Follow up 1 month/ as needed ? 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  Will discuss with hospice nurse can liberalize pain control if necessary.  Also recommend Zofran 8 mg ODT every 8 hours as needed for nausea.  Patient may be best served by having an ostomy dressing placed at site of drainage to collecting drainage and protect skin.  We will discuss this with hospice nursing. ? ?Intra-abdominal abscess ([place]) ?No collection noted on CT at end of March, will treat site of former drain with ostomy supplies see above. ? ?Hypertension ?Chronic, controlled. ?  ? ? ?Gladys Damme, MD ?[place]  ? ?

## 2021-11-22 NOTE — Assessment & Plan Note (Signed)
Will treat intermittent abdominal pain with oxycodone-acetaminophen 10-325 mg every 8 hours as needed.  Will discuss with hospice nurse can liberalize pain control if necessary.  Also recommend Zofran 8 mg ODT every 8 hours as needed for nausea.  Patient may be best served by having an ostomy dressing placed at site of drainage to collecting drainage and protect skin.  We will discuss this with hospice nursing. ?

## 2021-11-22 NOTE — Patient Instructions (Signed)
It was a pleasure to see you today! ? ?I will call your hospice nurse about putting on ostomy supplies to help with the drainage site. ?I recommend eating 3 meals a day and taking 1 or 2 bottles of ensure if able, this isn't meal replacement, but more protein to help prevent weight loss. An equivalent to ensure is 2 tablespoons of carnation instant breakfast in 1 cup of milk. ?If you have a fever, cannot eat or drink, or cannot use the bathroom, or have terrible pain, please call our office or let your hospice nurse know.  ? ? ?Be Well, ? ?Dr. Chauncey Reading ? ?

## 2021-11-22 NOTE — Assessment & Plan Note (Signed)
Chronic, controlled.

## 2021-12-17 ENCOUNTER — Telehealth: Payer: Self-pay

## 2021-12-17 NOTE — Telephone Encounter (Signed)
Patient calls nurse line requesting home health nursing services to come to his home to check on him.  ? ?Patient reports he already has hospice care, however they do not do "health" checks. Patient is interested in someone checking his drain sites.  ? ?It appears patient has [facility] nursing already.  ? ?Will forward to [facility] for further information.  ?

## 2021-12-21 NOTE — Telephone Encounter (Signed)
Left a message with [place] care to see what services patient is currently getting and to see what can be added to accommodate this request. Zell Hylton,CMA ? ?

## 2021-12-21 NOTE — Telephone Encounter (Signed)
I received this update from [place] and they will call patient.  Shayanna Thatch,CMA ? ? ?(I spoke with Everette Rank, hospice manager for [facility].  She has a new nurse going out to see him. We do check the drain sites. She?s going to call him now to see what?s going on. If you?d like to speak with [place], the number is [phone] ) ?

## 2022-01-04 ENCOUNTER — Emergency Department (HOSPITAL_COMMUNITY): Payer: Medicare Other

## 2022-01-04 ENCOUNTER — Inpatient Hospital Stay (HOSPITAL_COMMUNITY)
Admission: EM | Admit: 2022-01-04 | Discharge: 2022-01-16 | DRG: 064 | Disposition: A | Discharge status: Hospice | Payer: Medicare Other | Attending: Family Medicine | Admitting: Family Medicine

## 2022-01-04 ENCOUNTER — Other Ambulatory Visit: Payer: Self-pay

## 2022-01-04 ENCOUNTER — Encounter (HOSPITAL_COMMUNITY): Payer: Self-pay

## 2022-01-04 DIAGNOSIS — F1490 Cocaine use, unspecified, uncomplicated: Secondary | ICD-10-CM | POA: Diagnosis present

## 2022-01-04 DIAGNOSIS — I69341 Monoplegia of lower limb following cerebral infarction affecting right dominant side: Secondary | ICD-10-CM

## 2022-01-04 DIAGNOSIS — I6389 Other cerebral infarction: Secondary | ICD-10-CM | POA: Diagnosis not present

## 2022-01-04 DIAGNOSIS — D509 Iron deficiency anemia, unspecified: Secondary | ICD-10-CM

## 2022-01-04 DIAGNOSIS — K6389 Other specified diseases of intestine: Secondary | ICD-10-CM | POA: Diagnosis not present

## 2022-01-04 DIAGNOSIS — R29898 Other symptoms and signs involving the musculoskeletal system: Secondary | ICD-10-CM | POA: Diagnosis not present

## 2022-01-04 DIAGNOSIS — I639 Cerebral infarction, unspecified: Secondary | ICD-10-CM | POA: Diagnosis not present

## 2022-01-04 DIAGNOSIS — I63541 Cerebral infarction due to unspecified occlusion or stenosis of right cerebellar artery: Principal | ICD-10-CM | POA: Diagnosis present

## 2022-01-04 DIAGNOSIS — S143XXA Injury of brachial plexus, initial encounter: Secondary | ICD-10-CM | POA: Diagnosis present

## 2022-01-04 DIAGNOSIS — A419 Sepsis, unspecified organism: Secondary | ICD-10-CM | POA: Diagnosis present

## 2022-01-04 DIAGNOSIS — F17210 Nicotine dependence, cigarettes, uncomplicated: Secondary | ICD-10-CM | POA: Diagnosis not present

## 2022-01-04 DIAGNOSIS — G5631 Lesion of radial nerve, right upper limb: Secondary | ICD-10-CM

## 2022-01-04 DIAGNOSIS — C189 Malignant neoplasm of colon, unspecified: Secondary | ICD-10-CM | POA: Diagnosis not present

## 2022-01-04 DIAGNOSIS — R1084 Generalized abdominal pain: Secondary | ICD-10-CM | POA: Diagnosis not present

## 2022-01-04 DIAGNOSIS — K632 Fistula of intestine: Secondary | ICD-10-CM | POA: Diagnosis not present

## 2022-01-04 DIAGNOSIS — I6523 Occlusion and stenosis of bilateral carotid arteries: Secondary | ICD-10-CM | POA: Diagnosis not present

## 2022-01-04 DIAGNOSIS — Z79899 Other long term (current) drug therapy: Secondary | ICD-10-CM

## 2022-01-04 DIAGNOSIS — Y92009 Unspecified place in unspecified non-institutional (private) residence as the place of occurrence of the external cause: Secondary | ICD-10-CM | POA: Diagnosis not present

## 2022-01-04 DIAGNOSIS — R64 Cachexia: Secondary | ICD-10-CM | POA: Diagnosis not present

## 2022-01-04 DIAGNOSIS — W19XXXA Unspecified fall, initial encounter: Secondary | ICD-10-CM

## 2022-01-04 DIAGNOSIS — G629 Polyneuropathy, unspecified: Secondary | ICD-10-CM | POA: Diagnosis not present

## 2022-01-04 DIAGNOSIS — L0390 Cellulitis, unspecified: Secondary | ICD-10-CM | POA: Diagnosis present

## 2022-01-04 DIAGNOSIS — Z720 Tobacco use: Secondary | ICD-10-CM

## 2022-01-04 DIAGNOSIS — R109 Unspecified abdominal pain: Secondary | ICD-10-CM | POA: Diagnosis not present

## 2022-01-04 DIAGNOSIS — C182 Malignant neoplasm of ascending colon: Secondary | ICD-10-CM | POA: Diagnosis present

## 2022-01-04 DIAGNOSIS — Z79890 Hormone replacement therapy (postmenopausal): Secondary | ICD-10-CM | POA: Diagnosis not present

## 2022-01-04 DIAGNOSIS — Z681 Body mass index (BMI) 19 or less, adult: Secondary | ICD-10-CM | POA: Diagnosis not present

## 2022-01-04 DIAGNOSIS — R531 Weakness: Secondary | ICD-10-CM | POA: Diagnosis not present

## 2022-01-04 DIAGNOSIS — M21331 Wrist drop, right wrist: Secondary | ICD-10-CM | POA: Diagnosis present

## 2022-01-04 DIAGNOSIS — Z66 Do not resuscitate: Secondary | ICD-10-CM | POA: Diagnosis not present

## 2022-01-04 DIAGNOSIS — K5900 Constipation, unspecified: Secondary | ICD-10-CM | POA: Diagnosis not present

## 2022-01-04 DIAGNOSIS — M8580 Other specified disorders of bone density and structure, unspecified site: Secondary | ICD-10-CM | POA: Diagnosis present

## 2022-01-04 DIAGNOSIS — D649 Anemia, unspecified: Secondary | ICD-10-CM | POA: Diagnosis present

## 2022-01-04 DIAGNOSIS — Z8673 Personal history of transient ischemic attack (TIA), and cerebral infarction without residual deficits: Secondary | ICD-10-CM | POA: Diagnosis not present

## 2022-01-04 DIAGNOSIS — W06XXXA Fall from bed, initial encounter: Secondary | ICD-10-CM | POA: Diagnosis present

## 2022-01-04 DIAGNOSIS — L02415 Cutaneous abscess of right lower limb: Secondary | ICD-10-CM | POA: Diagnosis not present

## 2022-01-04 DIAGNOSIS — Z743 Need for continuous supervision: Secondary | ICD-10-CM | POA: Diagnosis not present

## 2022-01-04 DIAGNOSIS — R29700 NIHSS score 0: Secondary | ICD-10-CM | POA: Diagnosis present

## 2022-01-04 DIAGNOSIS — Z515 Encounter for palliative care: Secondary | ICD-10-CM

## 2022-01-04 DIAGNOSIS — I700 Atherosclerosis of aorta: Secondary | ICD-10-CM | POA: Diagnosis not present

## 2022-01-04 DIAGNOSIS — G4700 Insomnia, unspecified: Secondary | ICD-10-CM | POA: Diagnosis present

## 2022-01-04 DIAGNOSIS — Z933 Colostomy status: Secondary | ICD-10-CM

## 2022-01-04 DIAGNOSIS — E43 Unspecified severe protein-calorie malnutrition: Secondary | ICD-10-CM | POA: Diagnosis not present

## 2022-01-04 DIAGNOSIS — R29818 Other symptoms and signs involving the nervous system: Secondary | ICD-10-CM | POA: Diagnosis not present

## 2022-01-04 DIAGNOSIS — C180 Malignant neoplasm of cecum: Secondary | ICD-10-CM | POA: Diagnosis present

## 2022-01-04 DIAGNOSIS — I10 Essential (primary) hypertension: Secondary | ICD-10-CM | POA: Diagnosis present

## 2022-01-04 DIAGNOSIS — S6421XA Injury of radial nerve at wrist and hand level of right arm, initial encounter: Secondary | ICD-10-CM | POA: Diagnosis present

## 2022-01-04 DIAGNOSIS — K219 Gastro-esophageal reflux disease without esophagitis: Secondary | ICD-10-CM | POA: Diagnosis not present

## 2022-01-04 DIAGNOSIS — E8809 Other disorders of plasma-protein metabolism, not elsewhere classified: Secondary | ICD-10-CM | POA: Diagnosis present

## 2022-01-04 DIAGNOSIS — R252 Cramp and spasm: Secondary | ICD-10-CM | POA: Diagnosis present

## 2022-01-04 DIAGNOSIS — H5461 Unqualified visual loss, right eye, normal vision left eye: Secondary | ICD-10-CM | POA: Diagnosis present

## 2022-01-04 DIAGNOSIS — I69398 Other sequelae of cerebral infarction: Secondary | ICD-10-CM

## 2022-01-04 DIAGNOSIS — R079 Chest pain, unspecified: Secondary | ICD-10-CM | POA: Diagnosis not present

## 2022-01-04 DIAGNOSIS — R0902 Hypoxemia: Secondary | ICD-10-CM | POA: Diagnosis not present

## 2022-01-04 DIAGNOSIS — E785 Hyperlipidemia, unspecified: Secondary | ICD-10-CM

## 2022-01-04 DIAGNOSIS — R140 Abdominal distension (gaseous): Secondary | ICD-10-CM | POA: Diagnosis not present

## 2022-01-04 DIAGNOSIS — R1031 Right lower quadrant pain: Secondary | ICD-10-CM | POA: Diagnosis not present

## 2022-01-04 DIAGNOSIS — E041 Nontoxic single thyroid nodule: Secondary | ICD-10-CM | POA: Diagnosis not present

## 2022-01-04 HISTORY — DX: Malignant neoplasm of colon, unspecified: C18.9

## 2022-01-04 LAB — CBC WITH DIFFERENTIAL/PLATELET
Abs Immature Granulocytes: 0.11 10*3/uL — ABNORMAL HIGH (ref 0.00–0.07)
Basophils Absolute: 0 10*3/uL (ref 0.0–0.1)
Basophils Relative: 0 %
Eosinophils Absolute: 0 10*3/uL (ref 0.0–0.5)
Eosinophils Relative: 0 %
HCT: 27.8 % — ABNORMAL LOW (ref 39.0–52.0)
Hemoglobin: 8.4 g/dL — ABNORMAL LOW (ref 13.0–17.0)
Immature Granulocytes: 1 %
Lymphocytes Relative: 6 %
Lymphs Abs: 1.1 10*3/uL (ref 0.7–4.0)
MCH: 20.2 pg — ABNORMAL LOW (ref 26.0–34.0)
MCHC: 30.2 g/dL (ref 30.0–36.0)
MCV: 66.8 fL — ABNORMAL LOW (ref 80.0–100.0)
Monocytes Absolute: 0.8 10*3/uL (ref 0.1–1.0)
Monocytes Relative: 5 %
Neutro Abs: 14.8 10*3/uL — ABNORMAL HIGH (ref 1.7–7.7)
Neutrophils Relative %: 88 %
Platelets: 481 10*3/uL — ABNORMAL HIGH (ref 150–400)
RBC: 4.16 MIL/uL — ABNORMAL LOW (ref 4.22–5.81)
RDW: 19.4 % — ABNORMAL HIGH (ref 11.5–15.5)
WBC: 16.8 10*3/uL — ABNORMAL HIGH (ref 4.0–10.5)
nRBC: 0 % (ref 0.0–0.2)

## 2022-01-04 LAB — COMPREHENSIVE METABOLIC PANEL
ALT: 10 U/L (ref 0–44)
AST: 16 U/L (ref 15–41)
Albumin: 2.4 g/dL — ABNORMAL LOW (ref 3.5–5.0)
Alkaline Phosphatase: 70 U/L (ref 38–126)
Anion gap: 8 (ref 5–15)
BUN: 10 mg/dL (ref 8–23)
CO2: 28 mmol/L (ref 22–32)
Calcium: 8.7 mg/dL — ABNORMAL LOW (ref 8.9–10.3)
Chloride: 103 mmol/L (ref 98–111)
Creatinine, Ser: 0.8 mg/dL (ref 0.61–1.24)
GFR, Estimated: >60 mL/min (ref >60)
Glucose, Bld: 109 mg/dL — ABNORMAL HIGH (ref 70–99)
Potassium: 3.8 mmol/L (ref 3.5–5.1)
Sodium: 139 mmol/L (ref 135–145)
Total Bilirubin: 0.5 mg/dL (ref 0.3–1.2)
Total Protein: 6 g/dL — ABNORMAL LOW (ref 6.5–8.1)

## 2022-01-04 LAB — URINALYSIS, ROUTINE W REFLEX MICROSCOPIC
Bilirubin Urine: NEGATIVE
Glucose, UA: NEGATIVE mg/dL
Hgb urine dipstick: NEGATIVE
Ketones, ur: NEGATIVE mg/dL
Leukocytes,Ua: NEGATIVE
Nitrite: NEGATIVE
Protein, ur: NEGATIVE mg/dL
Specific Gravity, Urine: 1.026 (ref 1.005–1.030)
pH: 5 (ref 5.0–8.0)

## 2022-01-04 LAB — LACTIC ACID, PLASMA: Lactic Acid, Venous: 1.6 mmol/L (ref 0.5–1.9)

## 2022-01-04 LAB — MAGNESIUM: Magnesium: 1.9 mg/dL (ref 1.7–2.4)

## 2022-01-04 IMAGING — CT CT ABD-PELV W/ CM
2 of 5 series · 15 of 46 positions shown, 17 images · IV contrast (agent unspecified)
Comparison: [DATE]

CLINICAL DATA: Found on the ground.

EXAM:
CT ABDOMEN AND PELVIS WITH CONTRAST
TECHNIQUE: Multidetector CT imaging of the abdomen and pelvis was performed
using the standard protocol following bolus administration of
intravenous contrast.

[Series 4: abd/ pelvis 5.0 i30f 2 · axial · 0.78mm/px · z∈[-753,-318]mm · 12 of 99 slices shown, 14 images]
[im 6/99  soft-tissue]
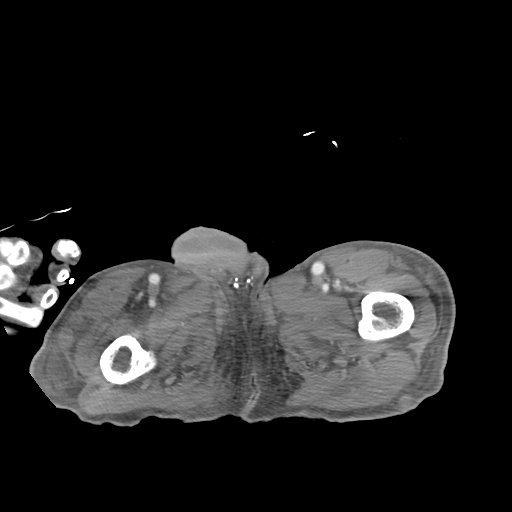
[im 6/99  bone]
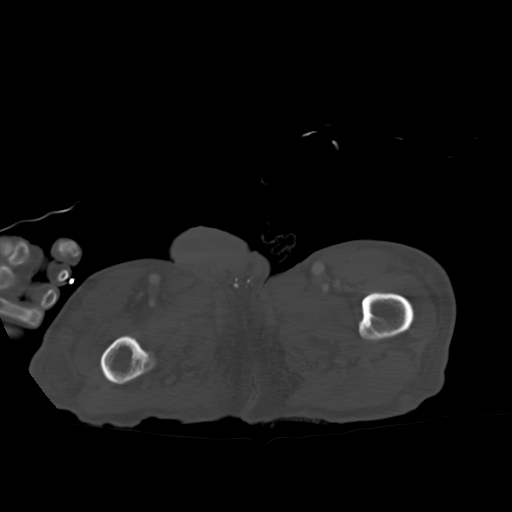
[im 16/99  soft-tissue]
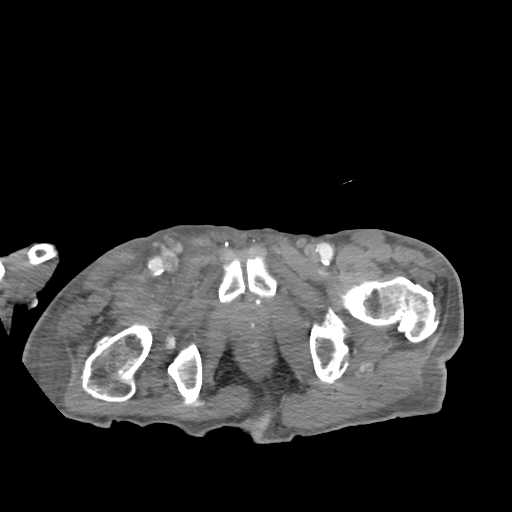
[im 21/99  soft-tissue]
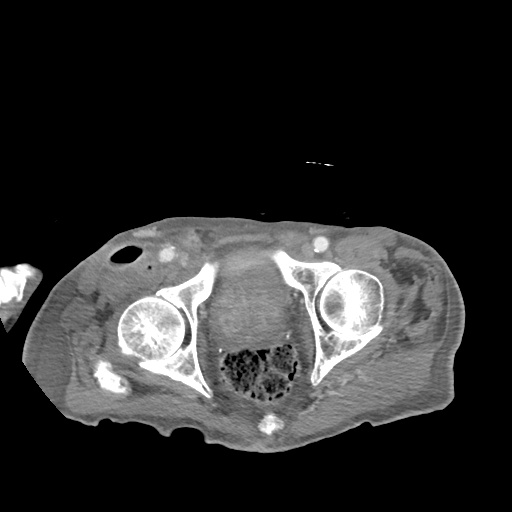
[im 31/99  soft-tissue]
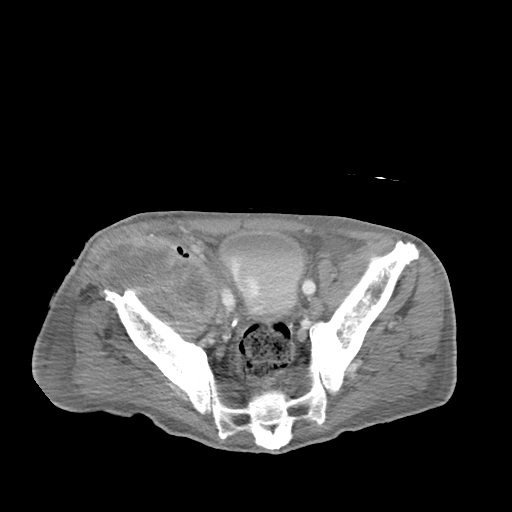
[im 37/99  soft-tissue]
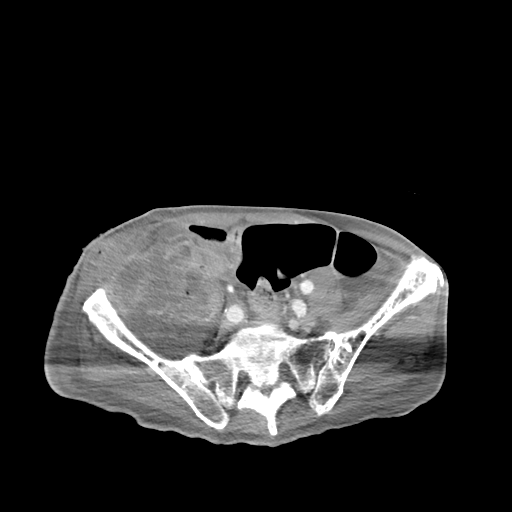
[im 47/99  soft-tissue]
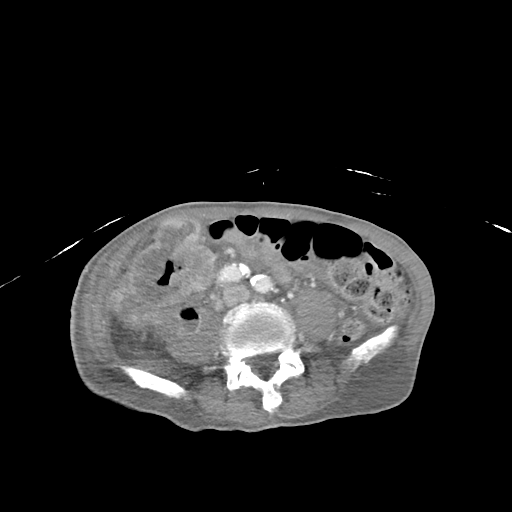
[im 52/99  soft-tissue]
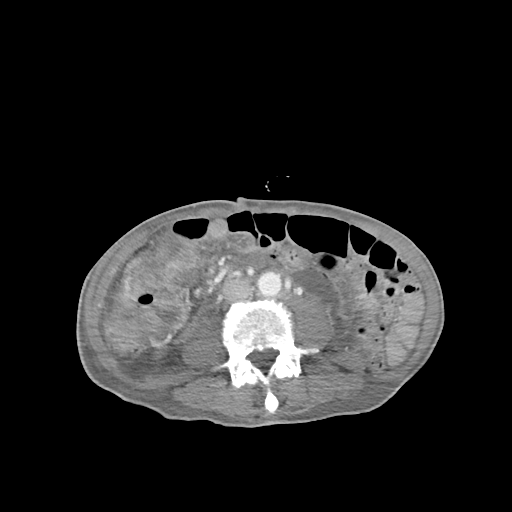
[im 62/99  soft-tissue]
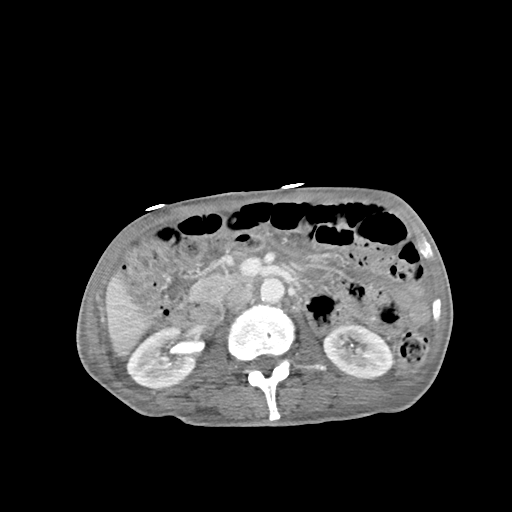
[im 68/99  soft-tissue]
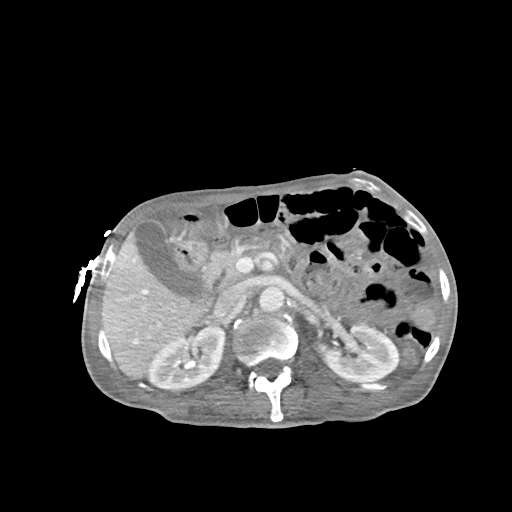
[im 68/99  bone]
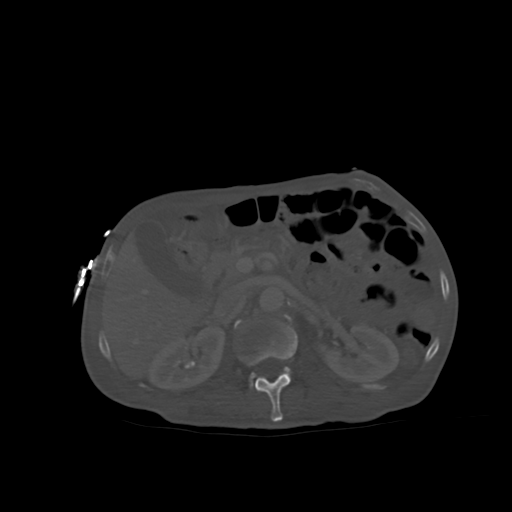
[im 78/99  soft-tissue]
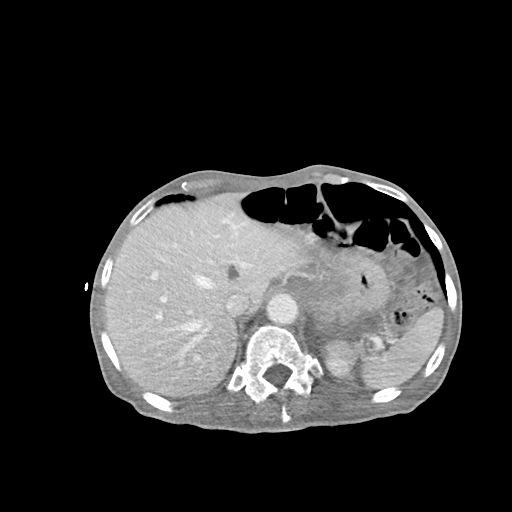
[im 83/99  soft-tissue]
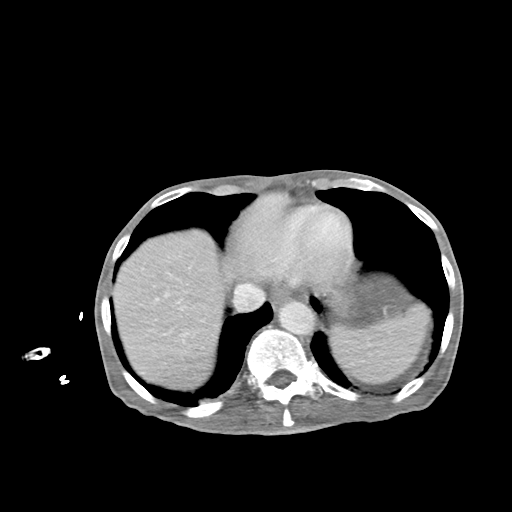
[im 93/99  soft-tissue]
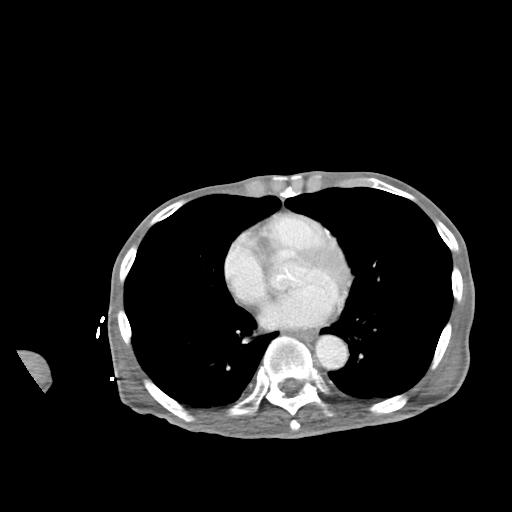

[Series 8: coronal soft tissue · coronal · 0.73mm/px · 3 of 80 slices shown]
[im 27/80  soft-tissue]
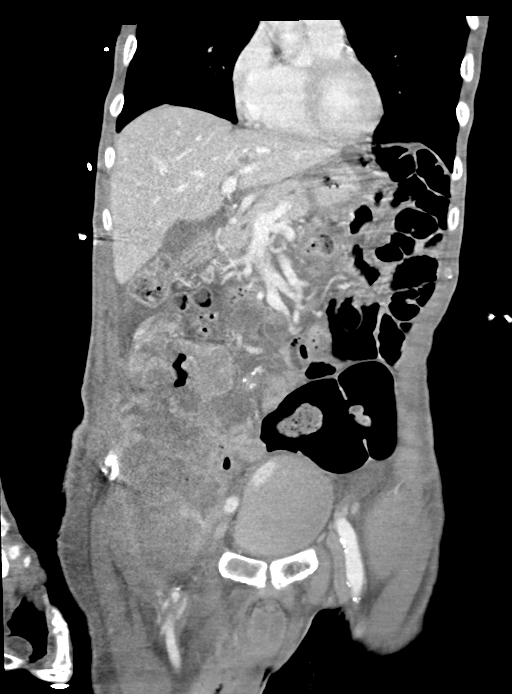
[im 36/80  soft-tissue]
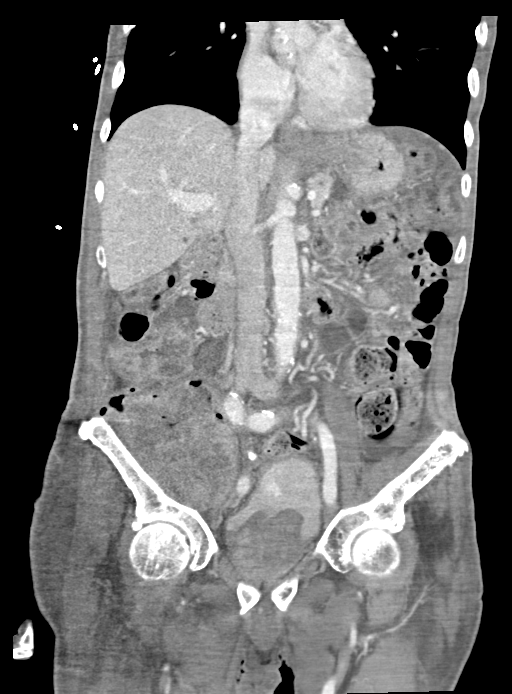
[im 44/80  soft-tissue]
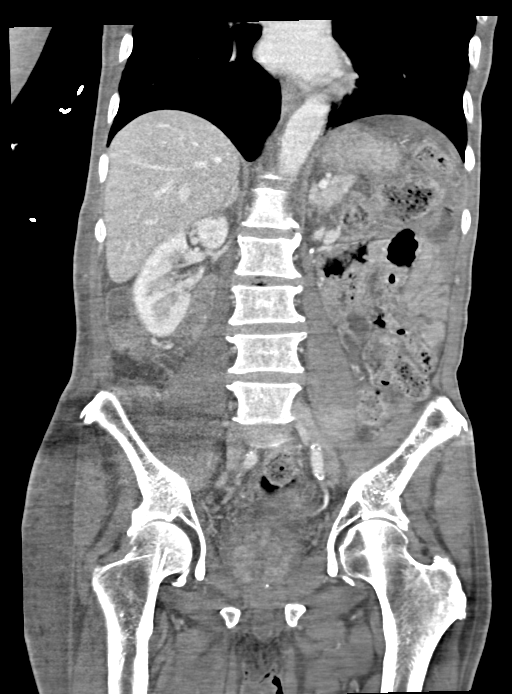

[15 of 46 positions shown; findings below may reference images not displayed]

RADIATION DOSE REDUCTION: This exam was performed according to the
departmental dose-optimization program which includes automated
exposure control, adjustment of the mA and/or kV according to
patient size and/or use of iterative reconstruction technique.

CONTRAST:  100mL OMNIPAQUE IOHEXOL 350 MG/ML SOLN
FINDINGS: Lower chest: No acute abnormality.

Hepatobiliary: A stable 10 mm focus of well-defined low attenuation
is seen within the posterior aspect of the left lobe of the liver.
No gallstones, gallbladder wall thickening, or biliary dilatation.

Pancreas: Unremarkable. No pancreatic ductal dilatation or
surrounding inflammatory changes.

Spleen: Normal in size without focal abnormality.

Adrenals/Urinary Tract: Adrenal glands are unremarkable. Kidneys are
normal, without renal calculi, focal lesion, or hydronephrosis.
Bladder is unremarkable.

Stomach/Bowel: Stomach and small bowel are within normal limits. A
10.8 cm x 8.5 cm x 15.2 cm complex, heterogeneous mass seen
involving the cecum and proximal to mid ascending colon. This is
increased in size when compared to the prior study (measures
approximately 10.8 cm x 8.5 cm x 15.2 cm) and extends inferiorly
along the anterior aspect of the right iliac bone, with invasion of
the anterior wall of the right hip.

Vascular/Lymphatic: Aortic atherosclerosis. No enlarged abdominal or
pelvic lymph nodes.

Reproductive: The prostate gland is markedly enlarged and
heterogeneous in appearance. This extends in to the base of the
urinary bladder and is stable in appearance when compared to the
prior study.

Other: A mild amount of abdominopelvic free fluid is seen.

Musculoskeletal: Multilevel degenerative changes are seen throughout
the lumbar spine.
IMPRESSION: 1. Interval increase in size of the complex, heterogeneous mass
involving the cecum and proximal to mid ascending colon, as
described above. This is consistent with a primary colon malignancy.
2. Marked severity prostatomegaly, stable in appearance when
compared to the prior study.
3. Mild amount of abdominopelvic free fluid.
4. Small, stable hepatic cyst or hemangioma.
5. Aortic atherosclerosis.

Aortic Atherosclerosis ([7K]-[7K]).

## 2022-01-04 IMAGING — CT CT HEAD W/O CM
4 series · 16 of 47 positions shown, 18 images · non-contrast
Comparison: CT [DATE].

CLINICAL DATA: Neuro deficit, acute, stroke suspected probable
subacute cva vs metastatic disease



[Series 2: head wo · axial · 0.44mm/px · z∈[-220,-100]mm · 7 of 34 slices shown, 9 images]
[im 5/34  brain]
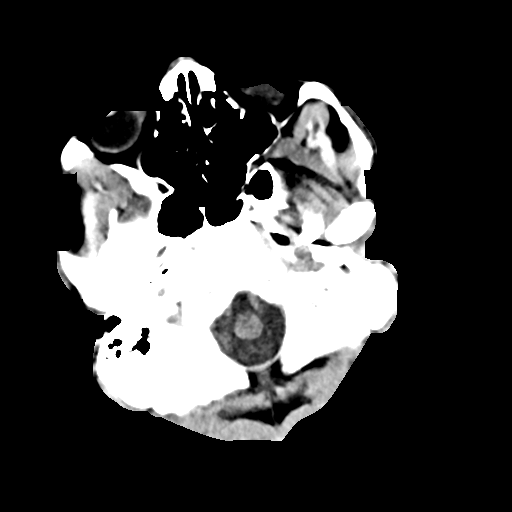
[im 5/34  bone]
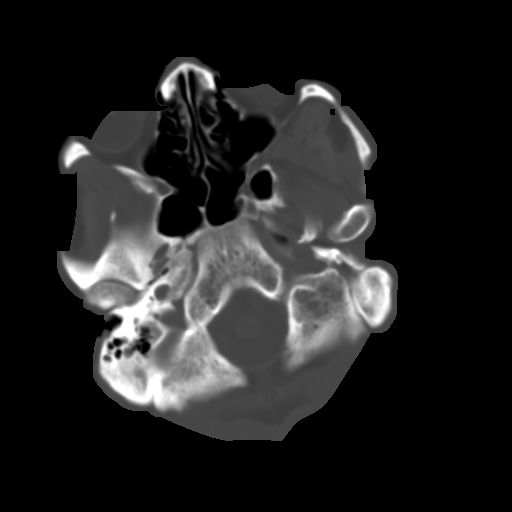
[im 9/34  brain]
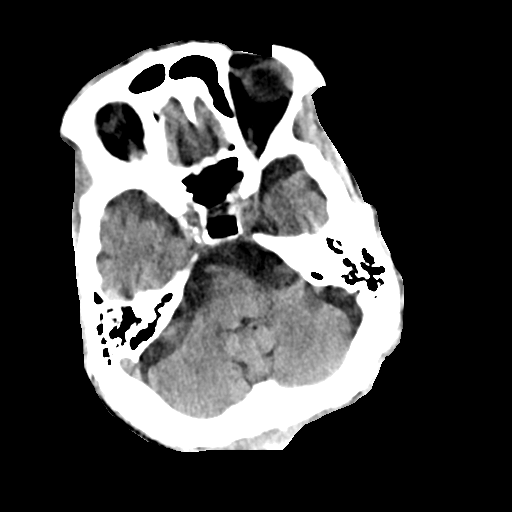
[im 13/34  brain]
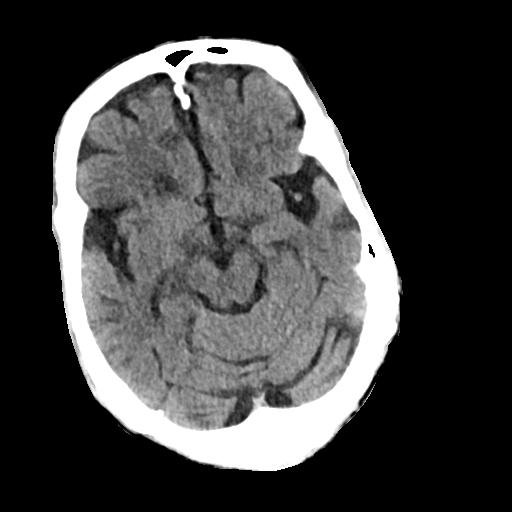
[im 17/34  brain]
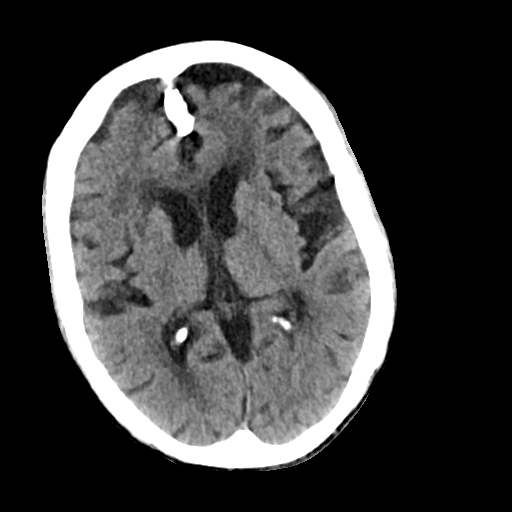
[im 21/34  brain]
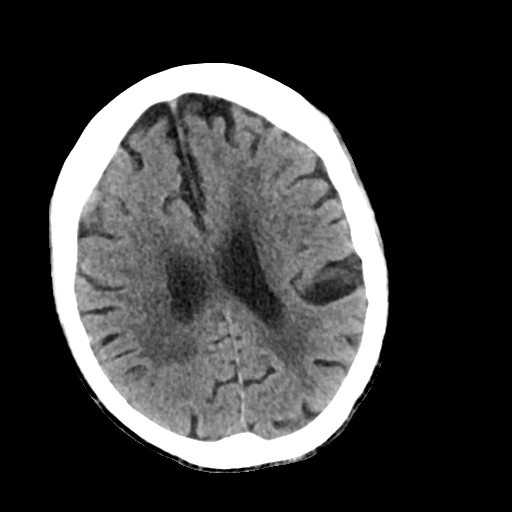
[im 21/34  bone]
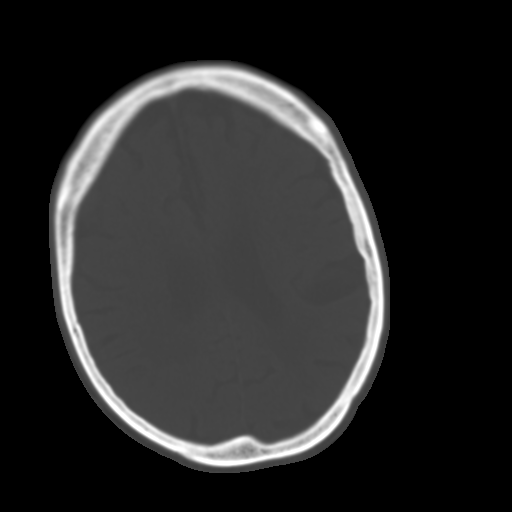
[im 25/34  brain]
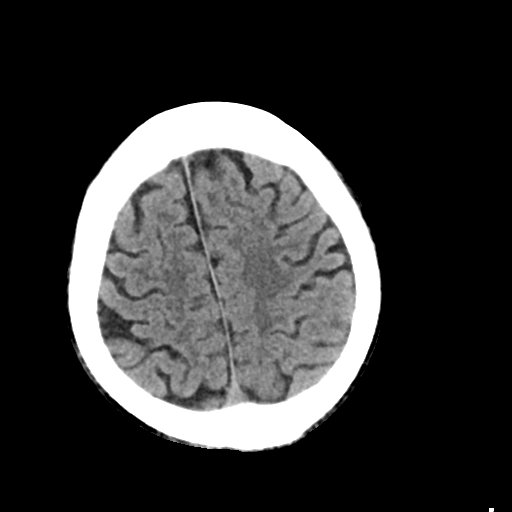
[im 29/34  brain]
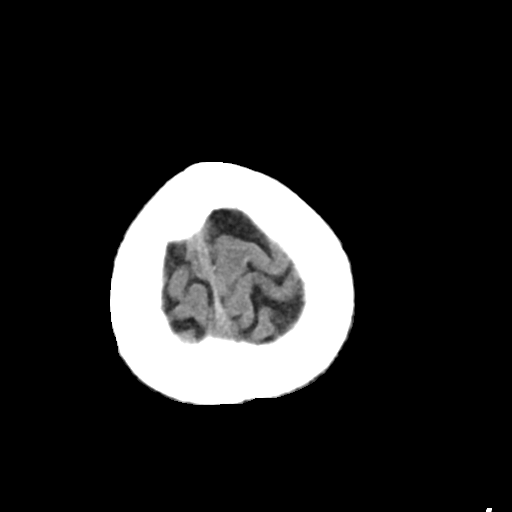

[Series 3: head bone · axial · 0.44mm/px · z∈[-224,-190]mm · 3 of 85 slices shown]
[im 9/85  bone]
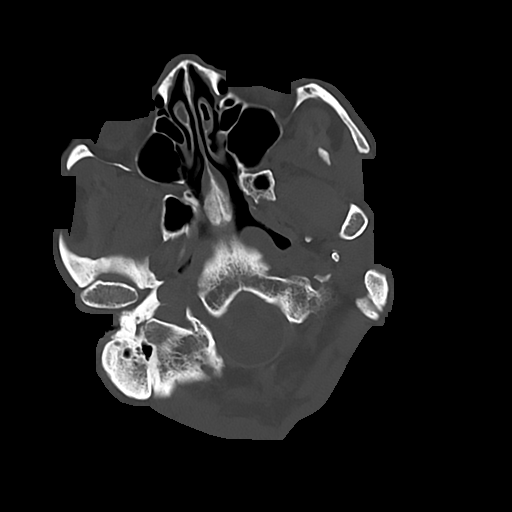
[im 17/85  bone]
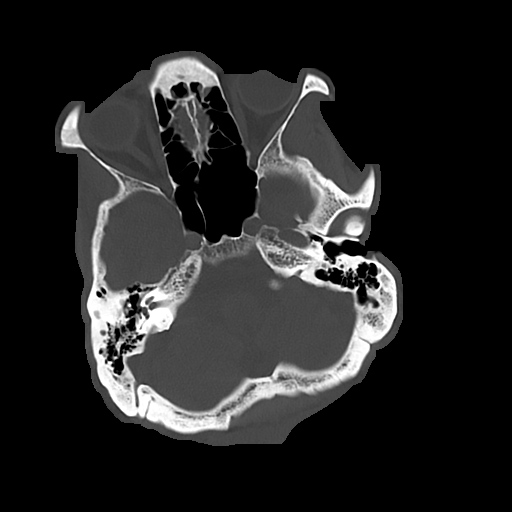
[im 26/85  bone]
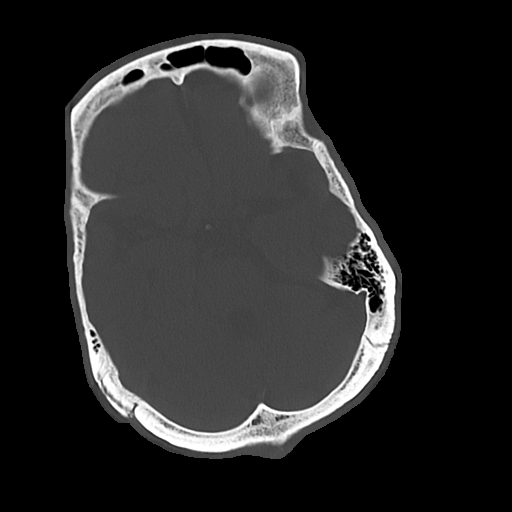

[Series 4: cor soft · coronal · 0.36mm/px · 3 of 76 slices shown]
[im 26/76  brain]
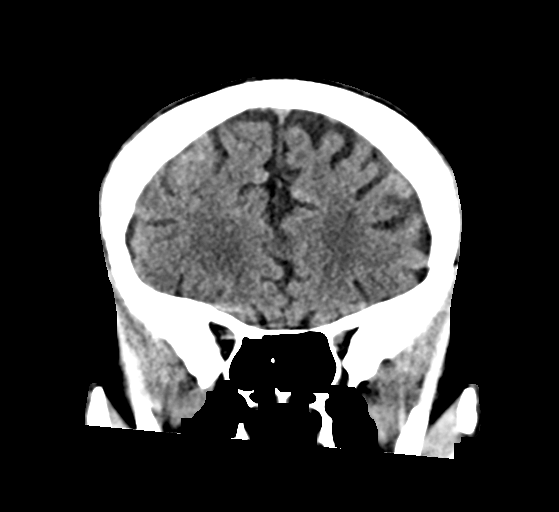
[im 34/76  brain]
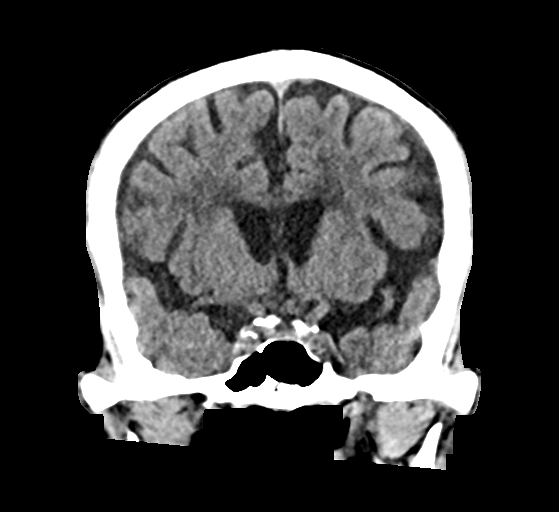
[im 42/76  brain]
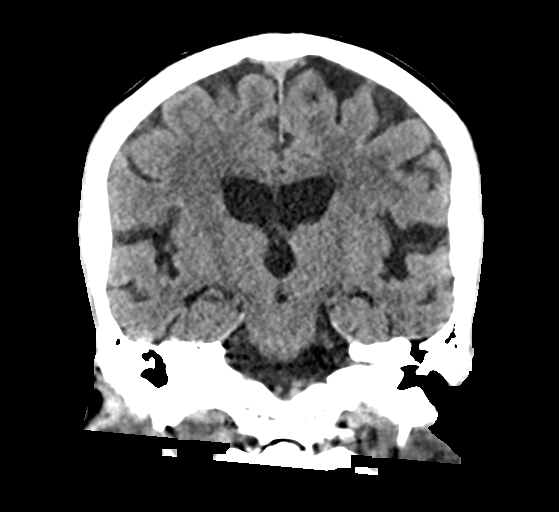

[Series 5: sag soft · sagittal · 0.33mm/px · 3 of 56 slices shown]
[im 19/56  brain]
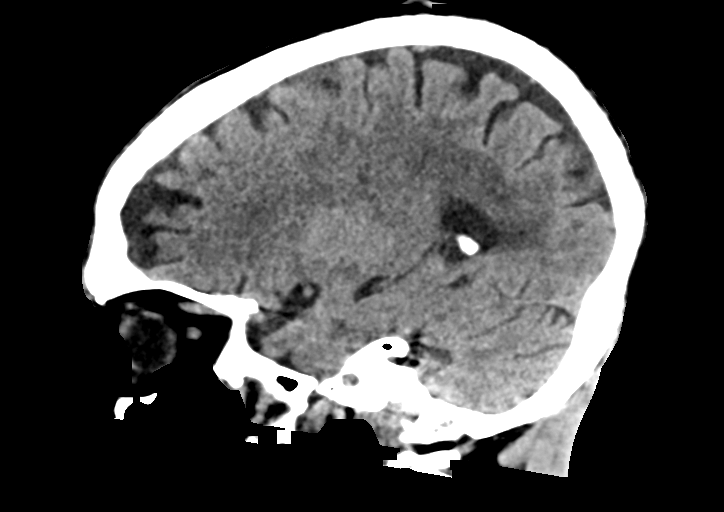
[im 28/56  brain]
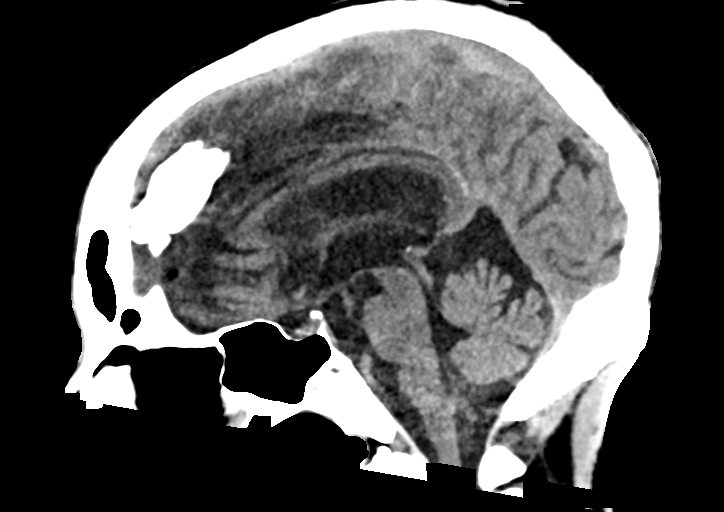
[im 37/56  brain]
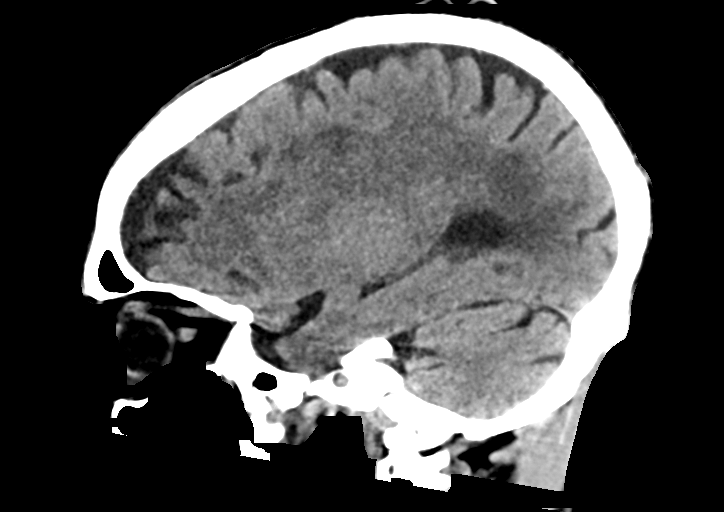

[16 of 47 positions shown; findings below may reference images not displayed]

FINDINGS: Brain: Small age indeterminate infarct in the right cerebellum.
Remote appearing infarct in the right corona radiata. No evidence of
acute hemorrhage, hydrocephalus, extra-axial collection or mass
lesion/mass effect. Moderate patchy white matter hypodensities,
nonspecific but compatible with chronic microvascular ischemic
disease.

Vascular: No hyperdense vessel identified. Calcific intracranial
sclerosis.

Skull: No acute fracture.

Sinuses/Orbits: Clear sinuses.  No acute orbital findings.

Other: No mastoid effusions.
IMPRESSION: 1. Small age indeterminate infarct in the right cerebellum, new
since [HQ]. Recommend MRI with contrast for far more sensitive
evaluation for acute infarct and metastatic disease.
2. Moderate chronic microvascular disease and remote appearing right
corona radiata infarct

## 2022-01-04 IMAGING — MR MR HEAD WO/W CM
11 of 14 series · 37 of 48 positions shown · IV contrast (gadavist)
Comparison: Same day CT head.  MRI head [DATE].

CLINICAL DATA: Neuro deficit, acute, stroke suspected

EXAM:
MRI HEAD WITHOUT AND WITH CONTRAST
TECHNIQUE: Multiplanar, acute multiecho pulse sequences of the brain and
surrounding structures were obtained without and with intravenous
contrast.
CONTRAST:  5mL GADAVIST GADOBUTROL 1 MMOL/ML IV SOLN

[Series 3: DWI · axial · 3.0mm · 1.09mm/px · z∈[-101,+49]mm · 6 of 105 slices shown (1 of 6)]
[im 1/105]
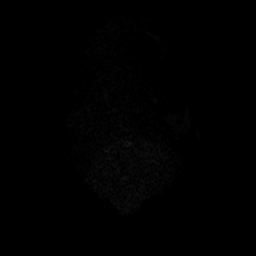
[im 21/105]
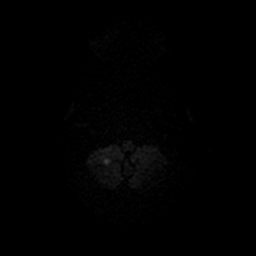
[im 42/105]
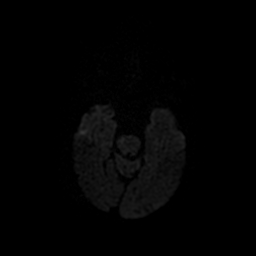
[im 63/105]
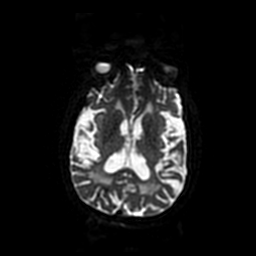
[im 84/105]
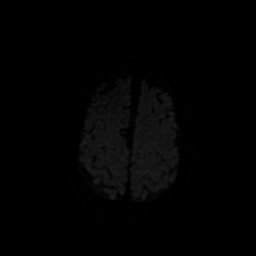
[im 105/105]
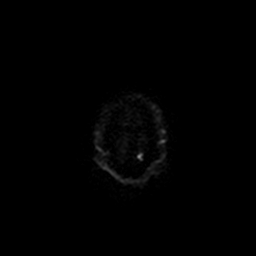

[Series 4: DWI · coronal · 5.0mm · 1.09mm/px · 5 of 72 slices shown (2 of 6)]
[im 1/72]
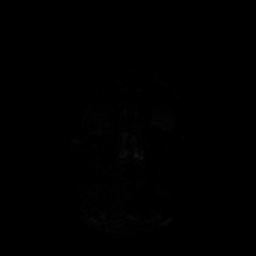
[im 18/72]
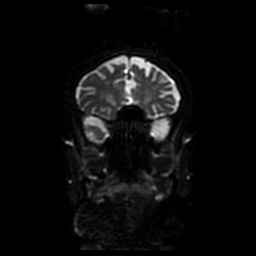
[im 36/72]
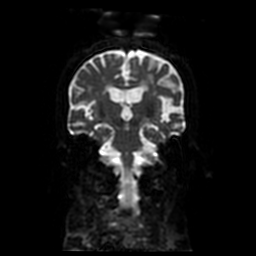
[im 54/72]
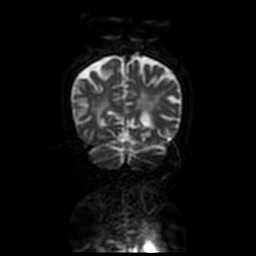
[im 72/72]
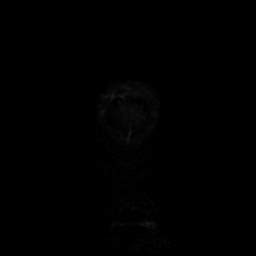

[Series 6: T2 · axial · 5.0mm · 0.45mm/px · z∈[-61,+88]mm · 2 of 26 slices shown]
[im 1/26]
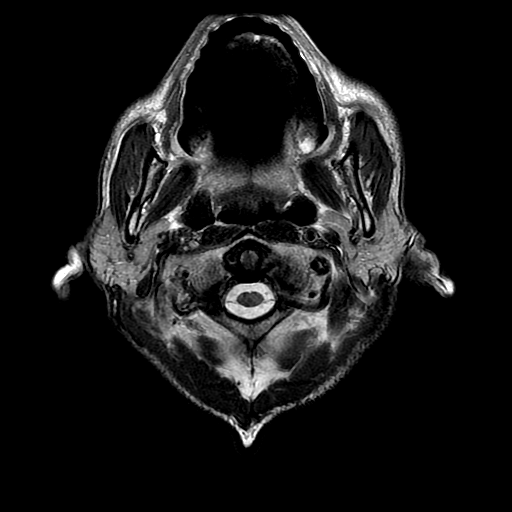
[im 26/26]
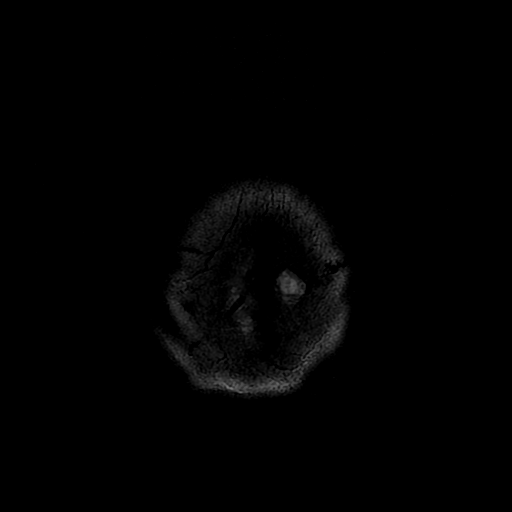

[Series 7: FLAIR · axial · 3.0mm · 0.43mm/px · z∈[-60,+89]mm · 2 of 26 slices shown]
[im 1/26]
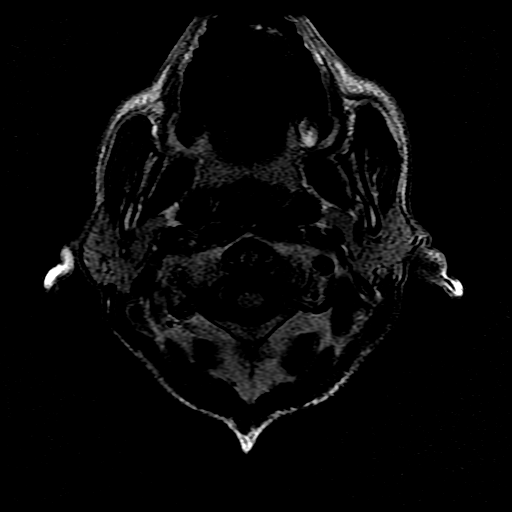
[im 26/26]
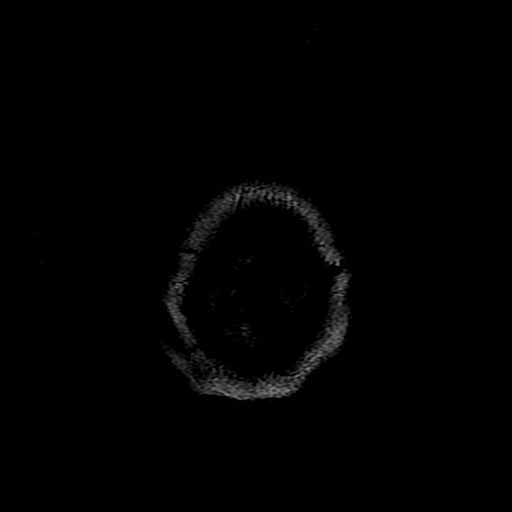

[Series 10: DWI · axial · 3.0mm · 1.09mm/px · z∈[-66,+85]mm · 7 of 103 slices shown (3 of 6)]
[im 1/103]
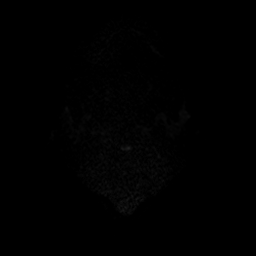
[im 18/103]
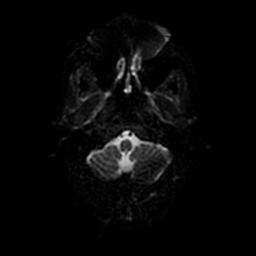
[im 35/103]
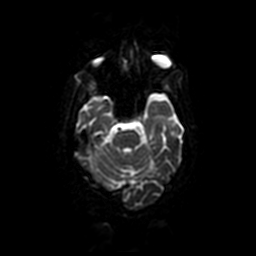
[im 52/103]
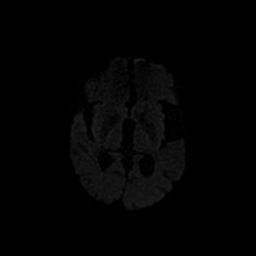
[im 69/103]
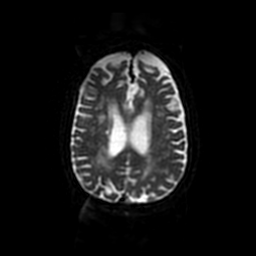
[im 86/103]
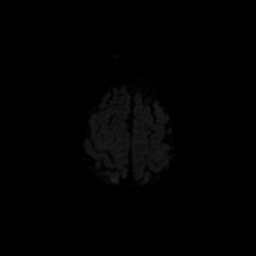
[im 103/103]
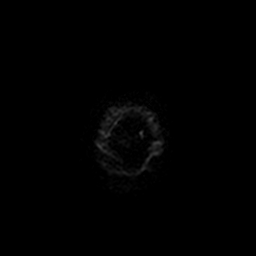

[Series 11: T2 post-contrast · coronal · 5.0mm · 0.43mm/px · 2 of 30 slices shown]
[im 1/30]
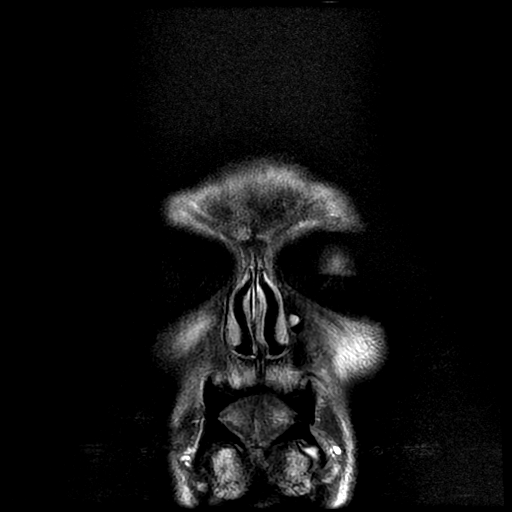
[im 30/30]
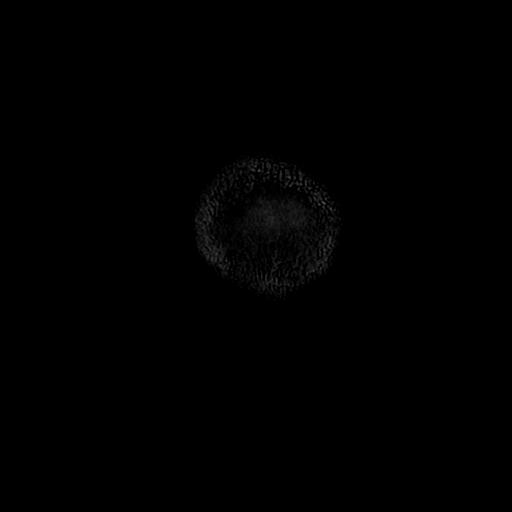

[Series 12: T1 post-contrast · axial · 3.0mm · 0.47mm/px · z∈[-41,+106]mm · 3 of 50 slices shown (1 of 2)]
[im 1/50]
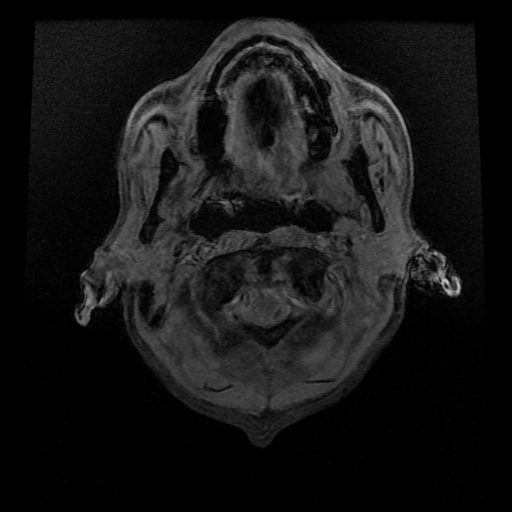
[im 25/50]
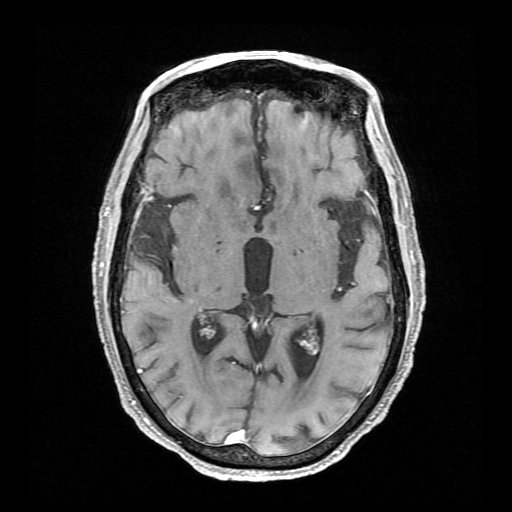
[im 50/50]
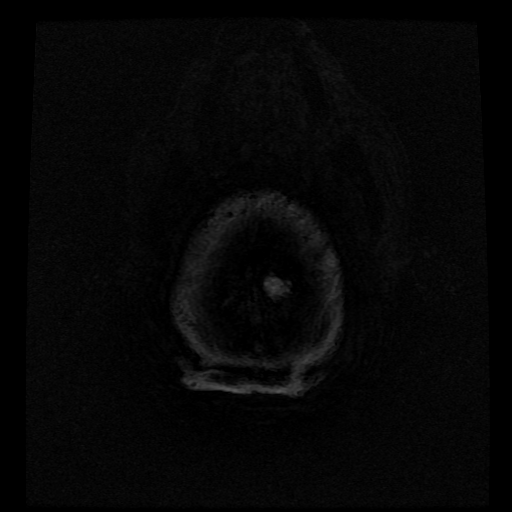

[Series 13: T1 post-contrast · coronal · 5.0mm · 0.43mm/px · 2 of 30 slices shown (2 of 2)]
[im 1/30]
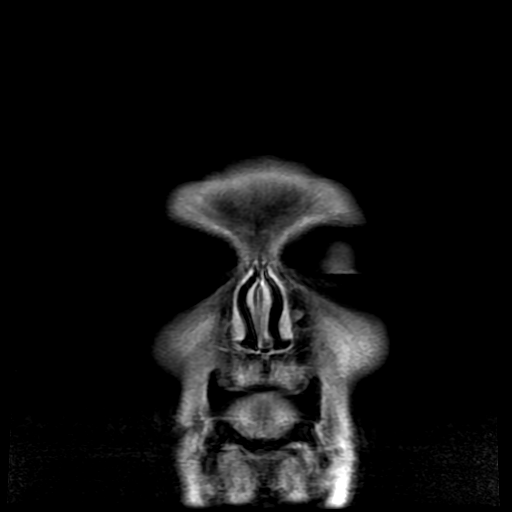
[im 30/30]
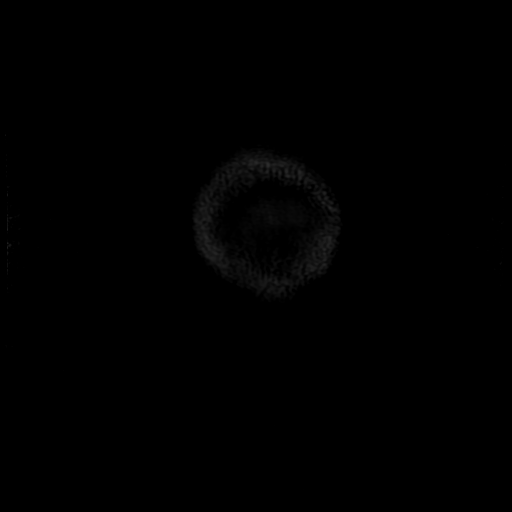

[Series 300: DWI · axial · 3.0mm · 1.09mm/px · z∈[-101,+49]mm · 3 of 53 slices shown (4 of 6)]
[im 1/53]
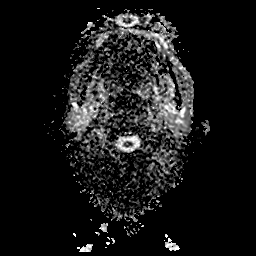
[im 27/53]
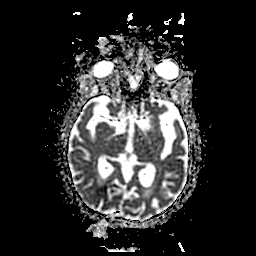
[im 53/53]
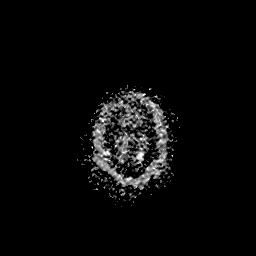

[Series 400: DWI · coronal · 5.0mm · 1.09mm/px · 2 of 36 slices shown (5 of 6)]
[im 1/36]
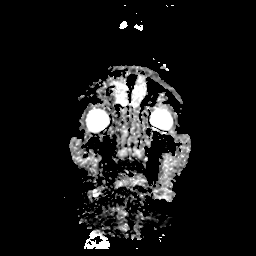
[im 36/36]
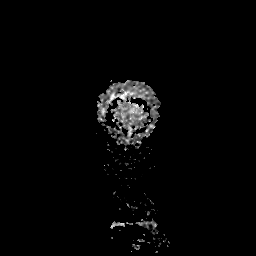

[Series 1000: DWI · axial · 3.0mm · 1.09mm/px · z∈[-66,+85]mm · 3 of 52 slices shown (6 of 6)]
[im 1/52]
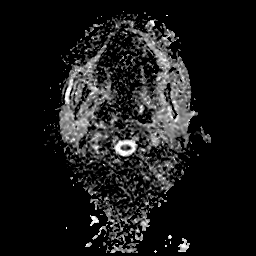
[im 26/52]
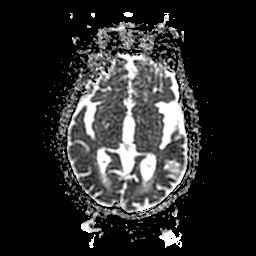
[im 52/52]
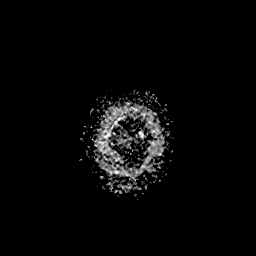

[37 of 48 positions shown; findings below may reference images not displayed]

FINDINGS: Brain: Small acute or subacute infarct in the inferior right
cerebellum (series 3, image 12), which is nonenhancing. Mild edema
without mass effect. Moderate additional scattered T2/FLAIR
hyperintensities in the white matter, nonspecific but compatible
with chronic microvascular ischemic disease. No pathologic
enhancement. No evidence of acute hemorrhage hydrocephalus, mass
lesion, extra-axial fluid collection, or midline shift.

Vascular: Major arterial flow voids are maintained at the skull
base.

Skull and upper cervical spine: Normal marrow signal. Partially
imaged degenerative changes in the cervical spine.

Sinuses/Orbits: Largely clear sinuses.  No acute orbital findings.

Other: No sizable mastoid effusions.
IMPRESSION: 1. Small acute or subacute infarct in the inferior right cerebellum.
Mild edema without mass effect.
2. Moderate chronic microvascular disease.

## 2022-01-04 MED ORDER — ACETAMINOPHEN 650 MG RE SUPP
650.0000 mg | Freq: Four times a day (QID) | RECTAL | Status: DC | PRN
Start: 2022-01-04 — End: 2022-01-16

## 2022-01-04 MED ORDER — FERROUS SULFATE 325 (65 FE) MG PO TABS
325.0000 mg | ORAL_TABLET | Freq: Every day | ORAL | Status: DC
  Administered 2022-01-05: 325 mg via ORAL
  Filled 2022-01-04: qty 1

## 2022-01-04 MED ORDER — FAMOTIDINE 20 MG PO TABS
20.0000 mg | ORAL_TABLET | Freq: Every day | ORAL | Status: DC
  Administered 2022-01-05 – 2022-01-16 (×11): 20 mg via ORAL
  Filled 2022-01-04 (×12): qty 1

## 2022-01-04 MED ORDER — MELATONIN 5 MG PO TABS
5.0000 mg | ORAL_TABLET | Freq: Every evening | ORAL | Status: DC | PRN
  Administered 2022-01-07 – 2022-01-11 (×2): 5 mg via ORAL
  Filled 2022-01-04 (×2): qty 1

## 2022-01-04 MED ORDER — SODIUM CHLORIDE 0.9 % IV SOLN
INTRAVENOUS | Status: DC
Start: 2022-01-04 — End: 2022-01-04

## 2022-01-04 MED ORDER — GADOBUTROL 1 MMOL/ML IV SOLN
5.0000 mL | Freq: Once | INTRAVENOUS | Status: AC | PRN
  Administered 2022-01-04: 5 mL via INTRAVENOUS

## 2022-01-04 MED ORDER — FENTANYL CITRATE PF 50 MCG/ML IJ SOSY: 50.0000 ug | PREFILLED_SYRINGE | Freq: Once | INTRAMUSCULAR | Status: DC

## 2022-01-04 MED ORDER — ACETAMINOPHEN 325 MG PO TABS
650.0000 mg | ORAL_TABLET | Freq: Four times a day (QID) | ORAL | Status: DC | PRN
  Administered 2022-01-05: 650 mg via ORAL
  Filled 2022-01-04: qty 2

## 2022-01-04 MED ORDER — VANCOMYCIN HCL IN DEXTROSE 1-5 GM/200ML-% IV SOLN
1000.0000 mg | INTRAVENOUS | Status: DC
  Filled 2022-01-04: qty 200

## 2022-01-04 MED ORDER — CEFTRIAXONE SODIUM 1 G IJ SOLR
1.0000 g | INTRAMUSCULAR | Status: DC
  Administered 2022-01-04: 1 g via INTRAVENOUS
  Filled 2022-01-04: qty 10

## 2022-01-04 MED ORDER — NICOTINE 14 MG/24HR TD PT24
14.0000 mg | MEDICATED_PATCH | Freq: Every day | TRANSDERMAL | Status: DC | PRN
Start: 2022-01-04 — End: 2022-01-16

## 2022-01-04 MED ORDER — BACLOFEN 10 MG PO TABS
5.0000 mg | ORAL_TABLET | Freq: Two times a day (BID) | ORAL | Status: DC
  Administered 2022-01-04 – 2022-01-16 (×23): 5 mg via ORAL
  Filled 2022-01-04 (×22): qty 1
  Filled 2022-01-04: qty 0.5
  Filled 2022-01-04: qty 1

## 2022-01-04 MED ORDER — VANCOMYCIN HCL 1250 MG/250ML IV SOLN
1250.0000 mg | Freq: Once | INTRAVENOUS | Status: AC
  Administered 2022-01-04: 1250 mg via INTRAVENOUS
  Filled 2022-01-04 (×2): qty 250

## 2022-01-04 MED ORDER — FENTANYL CITRATE PF 50 MCG/ML IJ SOSY: 25.0000 ug | PREFILLED_SYRINGE | INTRAMUSCULAR | Status: DC | PRN

## 2022-01-04 MED ORDER — SODIUM CHLORIDE 0.9 % IV SOLN: INTRAVENOUS | Status: AC

## 2022-01-04 MED ORDER — FENTANYL CITRATE PF 50 MCG/ML IJ SOSY
50.0000 ug | PREFILLED_SYRINGE | INTRAMUSCULAR | Status: DC | PRN
  Administered 2022-01-07: 50 ug via INTRAVENOUS
  Filled 2022-01-04: qty 1

## 2022-01-04 MED ORDER — STROKE: EARLY STAGES OF RECOVERY BOOK
Freq: Once | Status: AC
  Filled 2022-01-04: qty 1

## 2022-01-04 MED ORDER — ATORVASTATIN CALCIUM 80 MG PO TABS
80.0000 mg | ORAL_TABLET | Freq: Every day | ORAL | Status: DC
  Administered 2022-01-05 – 2022-01-16 (×11): 80 mg via ORAL
  Filled 2022-01-04 (×12): qty 1

## 2022-01-04 MED ORDER — ASPIRIN 81 MG PO TBEC
81.0000 mg | DELAYED_RELEASE_TABLET | Freq: Every day | ORAL | Status: DC
  Administered 2022-01-04 – 2022-01-16 (×12): 81 mg via ORAL
  Filled 2022-01-04 (×13): qty 1

## 2022-01-04 MED ORDER — NALOXONE HCL 0.4 MG/ML IJ SOLN
0.4000 mg | INTRAMUSCULAR | Status: DC | PRN
Start: 2022-01-04 — End: 2022-01-16

## 2022-01-04 MED ORDER — LABETALOL HCL 5 MG/ML IV SOLN: 10.0000 mg | INTRAVENOUS | Status: DC | PRN

## 2022-01-04 MED ORDER — IOHEXOL 350 MG/ML SOLN
100.0000 mL | Freq: Once | INTRAVENOUS | Status: AC | PRN
  Administered 2022-01-04: 100 mL via INTRAVENOUS

## 2022-01-04 NOTE — Assessment & Plan Note (Signed)
   #)   Hyperlipidemia: documented h/o such. On high intensity atorvastatin as outpatient.    Plan: continue home statin.      

## 2022-01-04 NOTE — ED Notes (Signed)
Patient transported to CT 

## 2022-01-04 NOTE — Assessment & Plan Note (Signed)
 #)   GERD: documented h/o such; on famotidine as outpatient.   Plan: continue home H2.

## 2022-01-04 NOTE — Assessment & Plan Note (Signed)
 #)   Chronic iron deficiency anemia: Documented history of such associated baseline hemoglobin range of 8-10, with presenting CBC reflecting hemoglobin consistent with this range.  On daily oral iron supplementation as an outpatient.  No overt evidence of active bleed.  Plan: Continue outpatient daily oral iron supplementation.  Repeat CBC in the morning.

## 2022-01-04 NOTE — Assessment & Plan Note (Signed)
 #)   Acute ischemic CVA: Presented with new onset right upper extremity weakness, with last known normal appearing to be around 1700 on 01/03/2022, with persistence of this deficit, and ensuing CT head showing small indeterminate infarct in the right cerebellum without additional evidence of acute process, will ensuing MRI brain showed small acute or subacute infarct in the inferior right cerebellum with mild edema in the absence of associated mass effect.  EDP discussed patient's case and imaging with the on-call neurologist, who recommended admission to the hospitalist service for further stroke evaluation/management,  including pursuit of CTA head and neck, as well as further assessment of potential modifiable acute ischemic CVA risk factors.  Neurology also recommended initiation of daily baby aspirin.  In terms of the mild edema associated with the patient's acute versus subacute infarct in the inferior right cerebellum, no recommendations at this time for initiation of steroids. neurology to formally consult, with additional recommendations to follow.  Clear correlation at this time between presenting acute right upper extremity weakness and a acute versus subacute ischemic infarct in the right cerebellum.  Of note, the patient reportedly possesses modifiable CVA risk factor in the form of hyperlipidemia for which she reports good compliance on atorvastatin milligrams p.o. daily.  Additionally, he confirms that he is a current smoker.  Denies any known history of hypertension, diabetes, obstructive sleep apnea or paroxysmal atrial fibrillation.  We will pursue EKG and monitor on telemetry to evaluate for presence of previously undiagnosed atrial fibrillation.  Patient is not a candidate for TPA administration given that presentation is outside of the window for administration of such in the setting of last known normal of 1700 on 01/03/2022.  He is also outside of the 24-hour window for consideration  for thrombectomy.   current outpatient antiplatelet/anticoagulant regimen: None.  Per neurology consultation, there is recommendation for initiation of a daily baby aspirin, which has been ordered.  No specific neurology recommendation for initiation of dual antiplatelet therapy at this time.  Will allow for permissive hypertension for 48 hours following onset of acute focal neurologic deficits, with associated parameters further quantified below, and with period of observance of permissive hypertension to end at 1700 on 01/05/2022.   Of note, patient received ASA 325 mg PO x 1 while in route to the ED today.    Plan: Nursing bedside swallow evaluation x 1 now, and will not initiate oral medications or diet until the patient has passed this. Head of the bed at 30 degrees. Neuro checks per protocol. VS per protocol. Will allow for permissive hypertension for 48 hours following onset of acute focal neurologic deficits, during which will hold home antihypertensive medications, with prn IV labetalol ordered for systolic blood pressure greater than 696 mmHg or diastolic blood pressure greater than 110 mmHg until 1700 on 01/05/2022. Monitor on telemetry, including monitoring for atrial fibrillation as modifiable risk factor for acute ischemic CVA.  Neurology consulted.  CTA head and neck. echocardiogram without bubble study has been ordered for the morning. Check lipid panel and A1c. PT/OT/ST consults have been ordered to occur in the morning.  Per neurology recommendation, daily baby aspirin has been started.  Continue existing high intensity atorvastatin. counseled the patient on the importance of smoking discontinuation, and have placed an inpatient consult to the smoking cessation program.  EKG.

## 2022-01-04 NOTE — Consult Note (Cosign Needed)
Neurology Consultation  Reason for Consult: stroke on MRI  Referring Physician: Dr. Gilford Raid   CC: inability to use right hand   History is obtained from:patient   HPI: Albert Love is a 81 y.o. 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 He states it started sometime between Monday afternoon after the Hospice nurse left and Tuesday morning. He is distressed over the inability to use his right hand due to being right handed. He denies and numbness, tingling, facial droop, vision changes or slurred speech. MRI Brain revealed subacute infarct. Neurology consulted for stroke workup   On attending evaluation, patient provided the additional history that he had last used cocaine on Sunday but remained in his normal health on Monday.  At some point Monday night he fell out of bed and he woke up on the floor on his back, unable to get back up into the bed until the hospice nurse arrived and helped him 2 or 3 hours later.  He is not sure if he was in a position that may have compressed his radial nerve.  He reports that the symptoms have not improved or worsened since onset.   LKW: MOnday 5/22 tpa given?: no, outside window Premorbid modified Rankin scale (mRS):  4-Needs assistance to walk and tend to bodily needs  ROS: Full ROS was performed and is negative except as noted in the HPI.  Past Medical History:  Diagnosis Date   Acute renal insufficiency 09/05/2019   CRAO (central retinal artery occlusion)    Perforated gastric ulcer s/p omental [name] patch 09/05/2019 09/05/2019     Family History  Problem Relation Age of Onset   Colon cancer Neg Hx    Pancreatic cancer Neg Hx    Esophageal cancer Neg Hx      Social History:   reports that he has been smoking cigarettes.  He has been smoking an average of .5 packs per day. He has never used smokeless tobacco. He reports current alcohol use. No history on file for drug use.  Medications  Current Facility-Administered Medications:     stroke: early stages of recovery book, , Does not apply, Once, August Albino, NP   aspirin EC tablet 81 mg, 81 mg, Oral, Daily, Beulah Gandy A, NP  Current Outpatient Medications:    atorvastatin (LIPITOR) 80 MG tablet, Take 80 mg by mouth daily., Disp: , Rfl:    Baclofen 5 MG TABS, Take 5 mg by mouth 2 (two) times daily., Disp: 30 each, Rfl: 0   famotidine (PEPCID) 10 MG tablet, Take 1 tablet (10 mg total) by mouth daily., Disp: 30 tablet, Rfl: 0   feeding supplement (ENSURE ENLIVE / ENSURE PLUS) LIQD, Take 237 mLs by mouth 3 (three) times daily between meals., Disp: 237 mL, Rfl: 12   feeding supplement (ENSURE ENLIVE / ENSURE PLUS) LIQD, Take 237 mLs by mouth 2 (two) times daily between meals., Disp: 237 mL, Rfl: 12   ferrous sulfate 325 (65 FE) MG EC tablet, Take 1 tablet (325 mg total) by mouth daily with breakfast., Disp: 90 tablet, Rfl: 3   melatonin 5 MG TABS, Take 1 tablet (5 mg total) by mouth at bedtime., Disp: , Rfl: 0   Multiple Vitamin (MULTIVITAMIN WITH MINERALS) TABS tablet, Take 1  tablet by mouth daily., Disp: , Rfl:    ondansetron (ZOFRAN-ODT) 8 MG disintegrating tablet, Take 1 tablet (8 mg total) by mouth every 8 (eight) hours as needed for nausea or vomiting., Disp: 20 tablet, Rfl: 0   tamsulosin (FLOMAX) 0.4 MG CAPS capsule, Take 1 capsule (0.4 mg total) by mouth daily., Disp: 30 capsule, Rfl:    Exam: Current vital signs: BP 135/80   Pulse 81   Temp 97.7 F (36.5 C) (Oral)   Resp 12   SpO2 99%  Vital signs in last 24 hours: Temp:  [97.7 F (36.5 C)] 97.7 F (36.5 C) (05/23 1047) Pulse Rate:  [77-97] 81 (05/23 1815) Resp:  [12-24] 12 (05/23 1815) BP: (124-147)/(79-96) 135/80 (05/23 1815) SpO2:  [96 %-100 %] 99 % (05/23 1815)  GENERAL: pleasant  elderly Awake, alert in NAD HEENT: - Normocephalic and atraumatic, dry mm LUNGS - Clear to auscultation bilaterally with no wheezes CV - S1S2 RRR, no m/r/g, equal pulses bilaterally. ABDOMEN - Soft, nontender, nondistended with normoactive BS Ext: warm, well perfused, intact peripheral pulses, no edema  NEURO:  Mental Status: AA&Ox4 Language: speech is clear  Naming, repetition, fluency, and comprehension intact. Cranial Nerves: PERRL 2 mm/brisk. EOMI, visual fields full, no facial asymmetry, facial sensation intact, hearing intact, tongue/uvula/soft palate midline, normal sternocleidomastoid and trapezius muscle strength. No evidence of tongue atrophy or fibrillations Motor: Right upper 4/5, right leg 2/5, Left upper 5/5, left leg 4/5 Tone: is normal and bulk is normal Sensation- Intact to light touch bilaterally Coordination: FTN intact bilaterally, no ataxia in BLE. Gait- deferred  On attending evaluation, he is 5/5 strength in the right deltoid, triceps, biceps, but has wrist extension weakness 2/5 in finger extension weakness 1/5, with difficulty extending the thumb difficulty with finger abduction as well (1/5).  Additionally he has some loss of sensation on the dorsum of the right hand and the dorsum of the thumb and index fingers  NIHSS 1a Level of Conscious.: 0 1b LOC Questions: 0 1c LOC Commands: 0 2 Best Gaze: 0 3 Visual: 0 4 Facial Palsy: 0 5a Motor Arm - left: 0 5b Motor Arm - Right: 0 6a Motor Leg - Left: 0 6b Motor Leg - Right: 3 7 Limb Ataxia: 0 8 Sensory: 0 9 Best Language: 0 10 Dysarthria: 0 11 Extinct. and Inatten.: 0 TOTAL: 0   Imaging I have reviewed the images obtained:  MRI examination of the brain 1. Small acute or subacute infarct in the inferior right cerebellum. Mild edema without mass effect. 2. Moderate chronic microvascular disease  Assessment:  Albert Love is a 81 y.o. 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 He states it started sometime between Monday afternoon after the Hospice nurse left and Tuesday morning  Acute/Subacute small right cerebellum ischemic infarct likely asymptomatic due to small vessel disease and probable hypercoagulable state due to cancer   Concern for peripheral nerve injury leading to right hand weakness, specifically a partial "Saturday night palsy"  Recommendations:  #Punctate right cerebellar stroke, likely asymptomatic - admit to medicine for stroke workup  - HgbA1c, fasting lipid panel; goal A1c less than 7%, LDL less than 70 - Frequent neuro checks - Echocardiogram - CTA head and neck - Prophylactic therapy-Antiplatelet med: Aspirin -  dose '81mg'$ ; holding off on Plavix in case patient needs additional drains etc. 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 -Outpatient EMG/nerve conduction study in 3-4 weeks to confirm diagnosis if patient has not recovered -PT/OT -If patient has worsening symptoms, would pursue MRI brachial plexus to evaluate for possible malignant involvement, but holding off for now given history more consistent with an acute compressive etiology (acute in onset, no gradual progression)  Appreciate management of sepsis due to cellulitis, colon cancer, and other comorbidities per primary team   Beulah Gandy DNP, ACNPC-AG   Attending Neurologist's note:  I personally saw this patient, gathering history, performing a full neurologic examination, reviewing relevant labs, personally reviewing relevant imaging including MRI brain which I also reviewed with  the patient at bedside, and formulated the assessment and plan, adding the note above for completeness and clarity to accurately reflect my thoughts

## 2022-01-04 NOTE — ED Notes (Signed)
Pt is [facility] pt and per RN they need to be contacted if we are admitting because they have to document stuff on their end ([phone])Pt is [facility] pt and per RN they need to be contacted if we are admitting because they have to document stuff on their end [phone])

## 2022-01-04 NOTE — Assessment & Plan Note (Signed)
  #)   Chronic tobacco abuse: Patient conveys that they are a current smoker, having smoked 0.5 ppd for greater than 30 years.   Plan: Counseled the patient on the importance of complete smoking discontinuation.  Order placed for prn nicotine patch for use during this hospitalization.  Inpatient consult for smoke cessation program has been placed, as described above.

## 2022-01-04 NOTE — ED Notes (Signed)
Ct called to notify that the pt has a new IV for contrast

## 2022-01-04 NOTE — ED Notes (Signed)
Spoke w/ hospice nurse and she shared concern about pt Albert Love drain site slowly opening and draining pus. Last week hospice RN reports pt started to have feces coming out of the drain site. Pt had been placed on abx, and they are unsure if he has been taking it. (Keflex and flagyl). LKW yesterday 4712. Hospice RN reports that pt does not have unilateral weakness at baseline, but unilateral weakness was noted upon this RN's exam on the R side.

## 2022-01-04 NOTE — ED Provider Notes (Signed)
[facility] EMERGENCY DEPARTMENT Provider Note   CSN: [id number] Arrival date & time: 01/04/22  1039     History  Chief Complaint  Patient presents with   Albert Love is a 81 y.o. male.  Patient reports he is having increased weakness patient states yesterday he fell to the floor and could not get himself up.  Patient reports he has colon cancer that is inoperable and he is currently receiving hospice care.  Patient reports he was previously followed by oncology but has stopped seeing oncology and is just being cared for by palliative care.  Patient reports he thinks he has been drinking plenty of fluids he has been eating less than usual.  He denies any nausea or vomiting.  Patient reports he takes oxycodone for pain.  Patient reports he did not have any injuries from fall.  Patient did not strike his head he denies any neck pain he denies any chest or abdominal pain.  Patient states he did not blackout.  Patient denies any symptoms of syncope.  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  Patient reports a decrease use of his right arm.  Patient noticed when he was attempting to use his phone.  The history is provided by the patient. No language interpreter was used.  Fall This is a new problem. The current episode started yesterday. The problem occurs constantly.      Home Medications Prior to Admission medications   Medication Sig Start Date End Date Taking? Authorizing Provider  atorvastatin (LIPITOR) 80 MG tablet Take 80 mg by mouth daily.    [provider]  Baclofen 5 MG TABS Take 5 mg by mouth 2 (two) times daily. 06/29/21   Lurline Del, DO  famotidine (PEPCID) 10 MG tablet Take 1 tablet (10 mg total) by mouth daily. 06/16/21   Holley Bouche, MD  feeding supplement (ENSURE ENLIVE / ENSURE PLUS) LIQD Take 237 mLs by mouth 3 (three) times daily between  meals. 06/15/21   Holley Bouche, MD  feeding supplement (ENSURE ENLIVE / ENSURE PLUS) LIQD Take 237 mLs by mouth 2 (two) times daily between meals. 06/29/21   Lurline Del, DO  ferrous sulfate 325 (65 FE) MG EC tablet Take 1 tablet (325 mg total) by mouth daily with breakfast. 05/12/21   Gatha Mayer, MD  melatonin 5 MG TABS Take 1 tablet (5 mg total) by mouth at bedtime. 06/24/21   [name], MD  Multiple Vitamin (MULTIVITAMIN WITH MINERALS) TABS tablet Take 1 tablet by mouth daily. 06/16/21   Holley Bouche, MD  ondansetron (ZOFRAN-ODT) 8 MG disintegrating tablet Take 1 tablet (8 mg total) by mouth every 8 (eight) hours as needed for nausea or vomiting. 11/22/21   Gladys Damme, MD  tamsulosin (FLOMAX) 0.4 MG CAPS capsule Take 1 capsule (0.4 mg total) by mouth daily. 06/25/21   [name], MD      Allergies    Patient has no known allergies.    Review of Systems   Review of Systems  All other systems reviewed and are negative.  Physical Exam Updated Vital Signs BP 136/90   Pulse 97   Temp 97.7 F (36.5 C) (Oral)   Resp 18   SpO2 98%  Physical Exam Vitals reviewed.  Constitutional:      Appearance: He is ill-appearing.  HENT:     Head: Normocephalic.  Nose: Nose normal.     Mouth/Throat:     Mouth: Mucous membranes are moist.  Cardiovascular:     Rate and Rhythm: Normal rate.  Pulmonary:     Effort: Pulmonary effort is normal.  Musculoskeletal:        General: No swelling or deformity.  Skin:    General: Skin is warm.  Neurological:     General: No focal deficit present.     Mental Status: He is alert.     Motor: Weakness present.     Comments: Right arm weakness, patient is able to move arm elbow and wrist but is weak and uncoordinated.  He is unable to use fingers to hold his phone.  Psychiatric:        Mood and Affect: Mood normal.    ED Results / Procedures / Treatments   Labs (all labs ordered are listed, but only abnormal results are  displayed) Labs Reviewed  CBC WITH DIFFERENTIAL/PLATELET - Abnormal; Notable for the following components:      Result Value   WBC 16.8 (*)    RBC 4.16 (*)    Hemoglobin 8.4 (*)    HCT 27.8 (*)    MCV 66.8 (*)    MCH 20.2 (*)    RDW 19.4 (*)    Platelets 481 (*)    Neutro Abs 14.8 (*)    Abs Immature Granulocytes 0.11 (*)    All other components within normal limits  COMPREHENSIVE METABOLIC PANEL - Abnormal; Notable for the following components:   Glucose, Bld 109 (*)    Calcium 8.7 (*)    Total Protein 6.0 (*)    Albumin 2.4 (*)    All other components within normal limits  URINALYSIS, ROUTINE W REFLEX MICROSCOPIC    EKG None  Radiology CT Head Wo Contrast  Result Date: 01/04/2022 CLINICAL DATA:  Neuro deficit, acute, stroke suspected probable subacute cva vs metastatic disease EXAM: CT HEAD WITHOUT CONTRAST TECHNIQUE: Contiguous axial images were obtained from the base of the skull through the vertex without intravenous contrast. RADIATION DOSE REDUCTION: This exam was performed according to the departmental dose-optimization program which includes automated exposure control, adjustment of the mA and/or kV according to patient size and/or use of iterative reconstruction technique. COMPARISON:  CT October 19, 2020. FINDINGS: Brain: Small age indeterminate infarct in the right cerebellum. Remote appearing infarct in the right corona radiata. No evidence of acute hemorrhage, hydrocephalus, extra-axial collection or mass lesion/mass effect. Moderate patchy white matter hypodensities, nonspecific but compatible with chronic microvascular ischemic disease. Vascular: No hyperdense vessel identified. Calcific intracranial sclerosis. Skull: No acute fracture. Sinuses/Orbits: Clear sinuses.  No acute orbital findings. Other: No mastoid effusions. IMPRESSION: 1. Small age indeterminate infarct in the right cerebellum, new since 2022. Recommend MRI with contrast for far more sensitive evaluation for  acute infarct and metastatic disease. 2. Moderate chronic microvascular disease and remote appearing right corona radiata infarct Electronically Signed   By: Margaretha Sheffield M.D.   On: 01/04/2022 14:19   DG Chest Port 1 View  Result Date: 01/04/2022 CLINICAL DATA:  Provided history: Weakness. Additional history provided: Patient found down. Right hand weakness. EXAM: PORTABLE CHEST 1 VIEW COMPARISON:  Prior chest radiographs 06/19/2021 and earlier. FINDINGS: Heart size within normal limits. No appreciable airspace consolidation. No evidence of pleural effusion or pneumothorax. No acute bony abnormality identified. IMPRESSION: No evidence of acute cardiopulmonary abnormality. Electronically Signed   By: Kellie Simmering D.O.   On: 01/04/2022 11:40  Procedures Procedures    Medications Ordered in ED Medications - No data to display  ED Course/ Medical Decision Making/ A&P                           Medical Decision Making Amount and/or Complexity of Data Reviewed Independent Historian: EMS    Details: Patient brought in by EMS from home due to increased weakness.  EMS come was called by hospice nurse because patient was on the ground when she arrived. External Data Reviewed: notes.    Details: Palliative care notes reviewed oncology notes reviewed Labs: ordered. Decision-making details documented in ED Course.    Details: Labs ordered reviewed and interpreted patient has an elevated white blood cell count of 16.8 patient's hemoglobin is 8.4 patient's total protein is 6 and albumin is 2.4 Radiology: ordered and independent interpretation performed. Decision-making details documented in ED Course.    Details: Chest x-ray no acute findings CT scan shows small indeterminate infarct to the right cerebral vellum new since 2022 radiologist recommended MRI Discussion of management or test interpretation with external provider(s): I spoke with Dr. Theda Sers neurology on call.  I suspect patient has a CVA  from the time of fall yesterday.  Dr. Theda Sers advised MRI with and without contrast to evaluate patient as he may have a CVA versus metastatic brain disease from his cancer.   PT's care turned over to oncoming provider  Verne Grain PA-C        Final Clinical Impression(s) / ED Diagnoses Final diagnoses:  Right arm weakness  Fall, initial encounter  Malignant neoplasm of colon, unspecified part of colon [facility])    Rx / [place] Orders ED Discharge Orders     None         Sidney Ace 01/04/22 1511    Malvin Johns, MD 01/04/22 1515

## 2022-01-04 NOTE — ED Notes (Signed)
Pt aware of need for urine specimen and urinal is at bedside

## 2022-01-04 NOTE — Progress Notes (Signed)
Pharmacy Antibiotic Note  Albert Love is a 81 y.o. male admitted on 01/04/2022 with cellulitis.  Pharmacy has been consulted for vancomycin dosing.  Approximate weight per chart review is ~125 lbs, which was used for dosing. WBC 16.8. Scr 0.8, afebrile.   Plan: Vancomycin 1250 mg x1 then 1g q24h Ceftriaxone 1g daily per MD F/u cultures, clinical course, vancomycin levels as appropriate, and ability to de-escalate therapy     Temp (24hrs), Avg:97.7 F (36.5 C), Min:97.7 F (36.5 C), Max:97.7 F (36.5 C)  Recent Labs  Lab 01/04/22 1141  WBC 16.8*  CREATININE 0.80    CrCl cannot be calculated (Unknown ideal weight.).    No Known Allergies  Antimicrobials this admission: 5/23 ceftriaxone >>  5/23 vancomycin >>   Dose adjustments this admission: N/A  Microbiology results: 5/23 BCx: sent  Thank you for allowing pharmacy to be a part of this patient's care.  Eduard Clos Zerrick Hanssen 01/04/2022 8:53 PM

## 2022-01-04 NOTE — Assessment & Plan Note (Signed)
 #)   Sepsis due to right upper quadrant abdominal wound: Within the context of a reported history of recurrent right lower quadrant abdominal wounds related to his underlying colon cancer, the patient reports new such wound over the last several days, with associated mild erythema, and associated purulent drainage.  EDP ordered CT abdomen/pelvis to evaluate for drainable fluid collection vs fistula/tracking into the abdomen/colon given the recurrent nature of these lesions and proximity to his colon cancer.   SIRS criteria met the presenting leukocytosis, which appears new for him, in addition to tachypnea.  Pursue stat lactic acid. Of note, in the absence of any evidence of end organ damage, pt's sepsis does not meet criteria to be considered severe in nature. Also, in the absence of LA level greater than or equal to 4.0, and in the absence of any associated hypotension refractory to IVF's, there are no indications for administration of a 30 mL/kg IVF bolus at this time.   Additional ED work-up/management notable for: Presenting urinalysis that is inconsistent with UTI, while chest x-ray shows no evidence of acute cardiopulmonary process.  Overall, no evidence of an additional infectious source at this time.    Plan: CBC w/ diff and CMP in AM.  Check blood cx's x 2. Abx: Start IV vancomycin Rocephin.  Follow-up results of CT abdomen/pelvis ordered by EDP, with consideration for addition of anaerobic coverage should this imaging demonstrated evidence of fistula/tracking into the abdomen.  Continuous IV fluids in the form of normal saline at 75 cc/h x 8 hours.  Check stat lactic acid.

## 2022-01-04 NOTE — H&P (Addendum)
History and Physical    PLEASE NOTE THAT DRAGON DICTATION SOFTWARE WAS USED IN THE CONSTRUCTION OF THIS NOTE.   Albert Love VVO:[id number] DOB: 07/25/1941 DOA: 01/04/2022  PCP: Gladys Damme, MD  Patient coming from: home   I have personally briefly reviewed patient's old medical records in [place]  Chief Complaint: Right upper extremity weakness  HPI: Albert Love is a 81 y.o. 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   The patient reports that he went to sleep around 1700 on 01/03/2022, in his normal state of health, without any acute focal neurologic deficits.  When he awoke on the morning of 01/04/2022, he noted interval onset of right upper extremity weakness, as pronounced in the right hand.  In the setting of his history of colon cancer for which he is on hospice, home hospice nurse also noted this right upper extremity weakness to represent an interval finding that was not present when she finished working with the patient during the late afternoon hours of 01/03/2022.   The patient has a history of prior ischemic stroke with residual right lower extremity weakness.  Within this framework, and beyond the aforementioned new onset right upper extremity weakness, he denies any additional acute focal weakness, numbness, paresthesias, dysphagia, dizziness, vertigo, nausea, vomiting, acute change in vision, word finding difficulties, slurring of speech with facial droop, or headache. He also denies any recent chest pain, shortness of breath, palpitations, diaphoresis,  presyncope, or syncope.  He acknowledges a history of hyperlipidemia, for which she reports good compliance on atorvastatin 80 mg p.o. daily.  Otherwise, he denies any known history of underlying hypertension, diabetes, obstructive sleep apnea, or paroxysmal atrial fibrillation.  He does report prior tobacco abuse, noting that he is smoked half a pack per day for greater than 30 years.  Not on any antiplatelet or anticoagulant medications as an outpatient, including no aspirin.  His medical history is also notable for recurrent wounds over the right lower quadrant of the abdomen, reportedly stemming from his underlying colon cancer.  The patient acknowledges development of an additional such wound over the last several days, noting some associated purulent drainage.  He denies subjective fever, chills, rigors, or generalized myalgias.  He notes that this been associated with mild surrounding erythema, but denies significant tenderness associated with this lesion.  No recent dysuria, gross hematuria, or change in urinary urgency/frequency.  No recent shortness of breath, cough.  He conveys that he was prescribed multiple oral antibiotics as an outpatient a few days ago for this right lower quadrant wound, but has not yet been able to start these medications, and does not recall the specific abx associated with these prescriptions.  Medical history also notable for chronic iron deficiency anemia associated baseline hemoglobin 8-10, for which she is on chronic daily oral iron supplementation.  In the setting of the patient's new onset right upper extremity weakness, he was brought by EMS to [facility] emergency department for further evaluation and management thereof.  In route to the hospital, he received full dose aspirin x1.     ED Course:  Vital signs in the ED were notable for the following: Afebrile;  heart rate 77-93; blood pressure 128/90 - 143/82; respiratory rate 15-20, oxygen saturation 97% on  room air.  Labs were notable for the following: CMP notable for the following: Sodium 139, creatinine 0.80, glucose 109, calcium, corrected for mild hypoalbuminemia noted to be 9.9, albumin 2.4, otherwise liver enzymes found to be within normal limits.  CBC notable for cell count 16,800 with 88% neutrophils, this is relative to most recent problem cell count of 6500 in November 2022, hemoglobin 8.4, platelet count 481.  Urinalysis notable for no white blood cells, while showing specific every 1.026.  Imaging and additional notable ED work-up: Chest x-ray shows no evidence of acute cardiopulmonary process.  Noncontrast CT head showed small age-indeterminate infarct in the right cerebellum, but otherwise demonstrated no evidence of acute intracranial process, including no evidence of intracranial hemorrhage.  MRI brain showed small acute or subacute infarct in the inferior right cerebellum, with mild edema without mass effect.  EDP discussed patient's case and imaging with the on-call neurologist, who recommended admission to the hospitalist service for further stroke evaluation/management,  including pursuit of CTA head and neck, as well as further assessment of potential modifiable acute ischemic CVA risk factors.  Neurology also recommended initiation of daily baby aspirin.  In terms of the mild edema associated with the patient's acute versus subacute infarct in the inferior right cerebellum, no recommendations at this time for initiation of steroids. neurology to formally consult, with additional recommendations to follow.  Additionally, in the setting of the patient's right lower quadrant wound, EDP ordered CT abdomen/pelvis to further evaluate, including to evaluate for drainable fluid collection.  While in the ED, the following were administered: (None)  Subsequently, the patient was admitted for further evaluation and management of acute ischemic stroke in the setting of acute right upper  extremity weakness.    Review of Systems: As per HPI otherwise 10 point review of systems negative.   Past Medical History:  Diagnosis Date   Acute renal insufficiency 09/05/2019   Colon cancer ([facility])    (on hospice)   CRAO (central retinal artery occlusion)    Perforated gastric ulcer s/p omental Graham patch 09/05/2019 09/05/2019    Past Surgical History:  Procedure Laterality Date   BOWEL RESECTION N/A 09/05/2019   Procedure: SMALL BOWEL RESECTION;  Surgeon: Clovis Riley, MD;  Location: [facility] ORS;  Service: General;  Laterality: N/A;   IR CATHETER TUBE CHANGE  06/22/2021   IR CATHETER TUBE CHANGE  07/13/2021   IR RADIOLOGIST EVAL & MGMT  07/21/2021   IR RADIOLOGIST EVAL & MGMT  09/01/2021   IR SINUS/FIST TUBE CHK-NON GI  09/19/2019   IR SINUS/FIST TUBE CHK-NON GI  09/19/2019   LAPAROTOMY N/A 09/05/2019   Procedure: EXPLORATORY LAPAROTOMY WITH [facility] AND GASTRIC BIOPSY;  Surgeon: Clovis Riley, MD;  Location: [facility] ORS;  Service: General;  Laterality: N/A;    Social History:  reports that he has been smoking cigarettes. He has been smoking an average of .5 packs per day. He has never used smokeless tobacco. He reports current alcohol use. No history on file for drug use.   No Known Allergies  Family History  Problem Relation Age of Onset   Colon cancer Neg Hx    Pancreatic cancer Neg Hx    Esophageal cancer Neg Hx     Family history reviewed and not pertinent    Prior to Admission medications   Medication Sig Start Date End Date Taking? Authorizing Provider  atorvastatin (LIPITOR)  80 MG tablet Take 80 mg by mouth daily.    [provider]  Baclofen 5 MG TABS Take 5 mg by mouth 2 (two) times daily. 06/29/21   Lurline Del, DO  famotidine (PEPCID) 10 MG tablet Take 1 tablet (10 mg total) by mouth daily. 06/16/21   Holley Bouche, MD  feeding supplement (ENSURE ENLIVE / ENSURE PLUS) LIQD Take 237 mLs by mouth 3 (three) times daily between meals. 06/15/21    Holley Bouche, MD  feeding supplement (ENSURE ENLIVE / ENSURE PLUS) LIQD Take 237 mLs by mouth 2 (two) times daily between meals. 06/29/21   Lurline Del, DO  ferrous sulfate 325 (65 FE) MG EC tablet Take 1 tablet (325 mg total) by mouth daily with breakfast. 05/12/21   Gatha Mayer, MD  melatonin 5 MG TABS Take 1 tablet (5 mg total) by mouth at bedtime. 06/24/21   [name], MD  Multiple Vitamin (MULTIVITAMIN WITH MINERALS) TABS tablet Take 1 tablet by mouth daily. 06/16/21   Holley Bouche, MD  ondansetron (ZOFRAN-ODT) 8 MG disintegrating tablet Take 1 tablet (8 mg total) by mouth every 8 (eight) hours as needed for nausea or vomiting. 11/22/21   Gladys Damme, MD  tamsulosin (FLOMAX) 0.4 MG CAPS capsule Take 1 capsule (0.4 mg total) by mouth daily. 06/25/21   [name], MD     Objective    Physical Exam: Vitals:   01/04/22 1815 01/04/22 1845 01/04/22 1900 01/04/22 1915  BP: 135/80 130/77 127/77 133/89  Pulse: 81 90 77 87  Resp: _0 Temp:      TempSrc:      SpO2: 99% 100% 98% 100%    General: appears to be stated age; alert, oriented Skin: warm, dry, no rash Head:  AT/[place] Mouth:  Oral mucosa membranes appear moist, normal dentition Neck: supple; trachea midline Heart:  RRR; did not appreciate any M/R/G Lungs: CTAB, did not appreciate any wheezes, rales, or rhonchi Abdomen: + BS; soft, ND, NT Vascular: 2+ pedal pulses b/l; 2+ radial pulses b/l Extremities: no peripheral edema, no muscle wasting Neuro: Neuro: 4/5 strength in RUE; 5/5 strength in LUE; 2/5 strength in RLE; 5/5 strength in LLE; sensation intact in upper and lower extremities b/l no evidence suggestive of slurred speech, dysarthria, or facial droop; Normal muscle tone. No tremors.    Labs on Admission: I have personally reviewed following labs and imaging studies  CBC: Recent Labs  Lab 01/04/22 1141  WBC 16.8*  NEUTROABS 14.8*  HGB 8.4*  HCT 27.8*  MCV 66.8*  PLT 481*    Basic Metabolic Panel: Recent Labs  Lab 01/04/22 1141  NA 139  K 3.8  CL 103  CO2 28  GLUCOSE 109*  BUN 10  CREATININE 0.80  CALCIUM 8.7*   GFR: CrCl cannot be calculated (Unknown ideal weight.). Liver Function Tests: Recent Labs  Lab 01/04/22 1141  AST 16  ALT 10  ALKPHOS 70  BILITOT 0.5  PROT 6.0*  ALBUMIN 2.4*   No results for input(s): LIPASE, AMYLASE in the last 168 hours. No results for input(s): AMMONIA in the last 168 hours. Coagulation Profile: No results for input(s): INR, PROTIME in the last 168 hours. Cardiac Enzymes: No results for input(s): CKTOTAL, CKMB, CKMBINDEX, TROPONINI in the last 168 hours. BNP (last 3 results) No results for input(s): PROBNP in the last 8760 hours. HbA1C: No results for input(s): HGBA1C in the last 72 hours. CBG: No results for input(s): GLUCAP in the last 168  hours. Lipid Profile: No results for input(s): CHOL, HDL, LDLCALC, TRIG, CHOLHDL, LDLDIRECT in the last 72 hours. Thyroid Function Tests: No results for input(s): TSH, T4TOTAL, FREET4, T3FREE, THYROIDAB in the last 72 hours. Anemia Panel: No results for input(s): VITAMINB12, FOLATE, FERRITIN, TIBC, IRON, RETICCTPCT in the last 72 hours. Urine analysis:    Component Value Date/Time   COLORURINE YELLOW 01/04/2022 1357   APPEARANCEUR HAZY (A) 01/04/2022 1357   LABSPEC 1.026 01/04/2022 1357   PHURINE 5.0 01/04/2022 1357   GLUCOSEU NEGATIVE 01/04/2022 1357   HGBUR NEGATIVE 01/04/2022 1357   [place] 01/04/2022 1357   KETONESUR NEGATIVE 01/04/2022 1357   PROTEINUR NEGATIVE 01/04/2022 1357   NITRITE NEGATIVE 01/04/2022 1357   [employer] [facility] [employer] 01/04/2022 1357    Radiological Exams on Admission: CT Head Wo Contrast  Result Date: 01/04/2022 CLINICAL DATA:  Neuro deficit, acute, stroke suspected probable subacute cva vs metastatic disease EXAM: CT HEAD WITHOUT CONTRAST TECHNIQUE: Contiguous axial images were obtained from the base of the skull  through the vertex without intravenous contrast. RADIATION DOSE REDUCTION: This exam was performed according to the departmental dose-optimization program which includes automated exposure control, adjustment of the mA and/or kV according to patient size and/or use of iterative reconstruction technique. COMPARISON:  CT October 19, 2020. FINDINGS: Brain: Small age indeterminate infarct in the right cerebellum. Remote appearing infarct in the right corona radiata. No evidence of acute hemorrhage, hydrocephalus, extra-axial collection or mass lesion/mass effect. Moderate patchy white matter hypodensities, nonspecific but compatible with chronic microvascular ischemic disease. Vascular: No hyperdense vessel identified. Calcific intracranial sclerosis. Skull: No acute fracture. Sinuses/Orbits: Clear sinuses.  No acute orbital findings. Other: No mastoid effusions. IMPRESSION: 1. Small age indeterminate infarct in the right cerebellum, new since 2022. Recommend MRI with contrast for far more sensitive evaluation for acute infarct and metastatic disease. 2. Moderate chronic microvascular disease and remote appearing right corona radiata infarct Electronically Signed   By: Margaretha Sheffield M.D.   On: 01/04/2022 14:19   MR Brain W and Wo Contrast  Result Date: 01/04/2022 CLINICAL DATA:  Neuro deficit, acute, stroke suspected EXAM: MRI HEAD WITHOUT AND WITH CONTRAST TECHNIQUE: Multiplanar, acute multiecho pulse sequences of the brain and surrounding structures were obtained without and with intravenous contrast. CONTRAST:  54m GADAVIST GADOBUTROL 1 MMOL/ML IV SOLN COMPARISON:  Same day CT head.  MRI head October 19, 2020. FINDINGS: Brain: Small acute or subacute infarct in the inferior right cerebellum (series 3, image 12), which is nonenhancing. Mild edema without mass effect. Moderate additional scattered T2/FLAIR hyperintensities in the white matter, nonspecific but compatible with chronic microvascular ischemic disease. No  pathologic enhancement. No evidence of acute hemorrhage hydrocephalus, mass lesion, extra-axial fluid collection, or midline shift. Vascular: Major arterial flow voids are maintained at the skull base. Skull and upper cervical spine: Normal marrow signal. Partially imaged degenerative changes in the cervical spine. Sinuses/Orbits: Largely clear sinuses.  No acute orbital findings. Other: No sizable mastoid effusions. IMPRESSION: 1. Small acute or subacute infarct in the inferior right cerebellum. Mild edema without mass effect. 2. Moderate chronic microvascular disease. Electronically Signed   By: FMargaretha SheffieldM.D.   On: 01/04/2022 17:04   DG Chest Port 1 View  Result Date: 01/04/2022 CLINICAL DATA:  Provided history: Weakness. Additional history provided: Patient found down. Right hand weakness. EXAM: PORTABLE CHEST 1 VIEW COMPARISON:  Prior chest radiographs 06/19/2021 and earlier. FINDINGS: Heart size within normal limits. No appreciable airspace consolidation. No evidence of pleural effusion or pneumothorax.  No acute bony abnormality identified. IMPRESSION: No evidence of acute cardiopulmonary abnormality. Electronically Signed   By: Kellie Simmering D.O.   On: 01/04/2022 11:40      Assessment/Plan   Principal Problem:   Acute ischemic stroke [facility]) Active Problems:   Anemia   Sepsis due to cellulitis (HCC)   Colon cancer (HCC)   HLD (hyperlipidemia)   Tobacco abuse   GERD (gastroesophageal reflux disease)     #) Acute ischemic CVA: Presented with new onset right upper extremity weakness, with last known normal appearing to be around 1700 on 01/03/2022, with persistence of this deficit, and ensuing CT head showing small indeterminate infarct in the right cerebellum without additional evidence of acute process, will ensuing MRI brain showed small acute or subacute infarct in the inferior right cerebellum with mild edema in the absence of associated mass effect.  EDP discussed patient's  case and imaging with the on-call neurologist, who recommended admission to the hospitalist service for further stroke evaluation/management,  including pursuit of CTA head and neck, as well as further assessment of potential modifiable acute ischemic CVA risk factors.  Neurology also recommended initiation of daily baby aspirin.  In terms of the mild edema associated with the patient's acute versus subacute infarct in the inferior right cerebellum, no recommendations at this time for initiation of steroids. neurology to formally consult, with additional recommendations to follow.  Clear correlation at this time between presenting acute right upper extremity weakness and a acute versus subacute ischemic infarct in the right cerebellum.  Of note, the patient reportedly possesses modifiable CVA risk factor in the form of hyperlipidemia for which she reports good compliance on atorvastatin milligrams p.o. daily.  Additionally, he confirms that he is a current smoker.  Denies any known history of hypertension, diabetes, obstructive sleep apnea or paroxysmal atrial fibrillation.  We will pursue EKG and monitor on telemetry to evaluate for presence of previously undiagnosed atrial fibrillation.  Patient is not a candidate for TPA administration given that presentation is outside of the window for administration of such in the setting of last known normal of 1700 on 01/03/2022.  He is also outside of the 24-hour window for consideration for thrombectomy.   current outpatient antiplatelet/anticoagulant regimen: None.  Per neurology consultation, there is recommendation for initiation of a daily baby aspirin, which has been ordered.  No specific neurology recommendation for initiation of dual antiplatelet therapy at this time.  Will allow for permissive hypertension for 48 hours following onset of acute focal neurologic deficits, with associated parameters further quantified below, and with period of observance of  permissive hypertension to end at 1700 on 01/05/2022.   Of note, patient received ASA 325 mg PO x 1 while in route to the ED today.    Plan: Nursing bedside swallow evaluation x 1 now, and will not initiate oral medications or diet until the patient has passed this. Head of the bed at 30 degrees. Neuro checks per protocol. VS per protocol. Will allow for permissive hypertension for 48 hours following onset of acute focal neurologic deficits, during which will hold home antihypertensive medications, with prn IV labetalol ordered for systolic blood pressure greater than 761 mmHg or diastolic blood pressure greater than 110 mmHg until 1700 on 01/05/2022. Monitor on telemetry, including monitoring for atrial fibrillation as modifiable risk factor for acute ischemic CVA.  Neurology consulted.  CTA head and neck. echocardiogram without bubble study has been ordered for the morning. Check lipid panel and A1c. PT/OT/ST consults  have been ordered to occur in the morning.  Per neurology recommendation, daily baby aspirin has been started.  Continue existing high intensity atorvastatin. counseled the patient on the importance of smoking discontinuation, and have placed an inpatient consult to the smoking cessation program.  EKG.        #) Sepsis due to right upper quadrant abdominal wound: Within the context of a reported history of recurrent right lower quadrant abdominal wounds related to his underlying colon cancer, the patient reports new such wound over the last several days, with associated mild erythema, and associated purulent drainage.  EDP ordered CT abdomen/pelvis to evaluate for drainable fluid collection vs fistula/tracking into the abdomen/colon given the recurrent nature of these lesions and proximity to his colon cancer.   SIRS criteria met the presenting leukocytosis, which appears new for him, in addition to tachypnea.  Pursue stat lactic acid. Of note, in the absence of any evidence of end  organ damage, pt's sepsis does not meet criteria to be considered severe in nature. Also, in the absence of LA level greater than or equal to 4.0, and in the absence of any associated hypotension refractory to IVF's, there are no indications for administration of a 30 mL/kg IVF bolus at this time.   Additional ED work-up/management notable for: Presenting urinalysis that is inconsistent with UTI, while chest x-ray shows no evidence of acute cardiopulmonary process.  Overall, no evidence of an additional infectious source at this time.    Plan: CBC w/ diff and CMP in AM.  Check blood cx's x 2. Abx: Start IV vancomycin Rocephin.  Follow-up results of CT abdomen/pelvis ordered by EDP, with consideration for addition of anaerobic coverage should this imaging demonstrated evidence of fistula/tracking into the abdomen.  Continuous IV fluids in the form of normal saline at 75 cc/h x 8 hours.  Check stat lactic acid.       #) Colon cancer: Per chart review, patient no longer pursuing chemotherapy/radiation, including from a palliative standpoint.  He is on hospice, but not comfort care measures only at this time.  Prn IV fentanyl.           #) GERD: documented h/o such; on famotidine as outpatient.   Plan: continue home H2.         #) Chronic iron deficiency anemia: Documented history of such associated baseline hemoglobin range of 8-10, with presenting CBC reflecting hemoglobin consistent with this range.  On daily oral iron supplementation as an outpatient.  No overt evidence of active bleed.  Plan: Continue outpatient daily oral iron supplementation.  Repeat CBC in the morning.         #) Chronic tobacco abuse: Patient conveys that they are a current smoker, having smoked 0.5 ppd for greater than 30 years.   Plan: Counseled the patient on the importance of complete smoking discontinuation.  Order placed for prn nicotine patch for use during this hospitalization.  Inpatient  consult for smoke cessation program has been placed, as described above.         #) Hyperlipidemia: documented h/o such. On high intensity atorvastatin as outpatient.    Plan: continue home statin.          DVT prophylaxis: SCD's   Code Status: DNR as confirmed by the patient Family Communication: none Disposition Plan: Per Rounding Team Consults called: Neurology has been consulted, as further detailed above;  Admission status: Observation    PLEASE NOTE THAT DRAGON DICTATION SOFTWARE WAS USED IN THE CONSTRUCTION  OF THIS NOTE.   [place] DO [facility]  From [place]   01/04/2022, 8:47 PM

## 2022-01-04 NOTE — ED Notes (Signed)
Pt back from MRI 

## 2022-01-04 NOTE — ED Provider Notes (Signed)
Patient is a 81 year old male whose care was transferred to me by Saunders Revel.  Please see her HPI below:  Patient reports he is having increased weakness patient states yesterday he fell to the floor and could not get himself up.  Patient reports he has colon cancer that is inoperable and he is currently receiving hospice care.  Patient reports he was previously followed by oncology but has stopped seeing oncology and is just being cared for by palliative care.  Patient reports he thinks he has been drinking plenty of fluids he has been eating less than usual.  He denies any nausea or vomiting.  Patient reports he takes oxycodone for pain.  Patient reports he did not have any injuries from fall.  Patient did not strike his head he denies any neck pain he denies any chest or abdominal pain.  Patient states he did not blackout.  Patient denies any symptoms of syncope.  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  Patient reports a decrease use of his right arm.  Patient noticed when he was attempting to use his phone.   The history is provided by the patient. No language interpreter was used.  Fall This is a new problem. The current episode started yesterday. The problem occurs constantly.  Physical Exam  BP 139/87   Pulse 83   Temp 97.7 F (36.5 C) (Oral)   Resp (!) 24   SpO2 99%   Physical Exam Vitals and nursing note reviewed.  Constitutional:      General: He is not in acute distress.    Appearance: He is well-developed.  HENT:     Head: Normocephalic and atraumatic.     Right Ear: External ear normal.     Left Ear: External ear normal.  Eyes:     General: No scleral icterus.       Right eye: No discharge.        Left eye: No discharge.     Conjunctiva/sclera: Conjunctivae normal.  Neck:     Trachea: No tracheal deviation.  Cardiovascular:     Rate and Rhythm: Normal rate.  Pulmonary:     Effort:  Pulmonary effort is normal. No respiratory distress.     Breath sounds: No stridor.  Abdominal:     General: There is no distension.  Musculoskeletal:        General: No swelling or deformity.     Cervical back: Neck supple.  Skin:    General: Skin is warm and dry.     Findings: No rash.  Neurological:     Mental Status: He is alert and oriented to person, place, and time.     Cranial Nerves: Cranial nerve deficit: no gross deficits.    Procedures  Procedures  ED Course / MDM   Clinical Course as of 01/04/22 1945  Tue Jan 04, 2022  1510 Nursing staff notified me that when undressing the patient they noticed a purulent abscess along the right hip with copious amounts of drainage.  Please see image below.  Per record review, it appears that patient has had previous abscesses in the region before and is likely secondary to his nonoperative cancer in the region.  Patient notified nursing staff that at times his hospice nurse the stool coming from this site as well.  We will also obtain a CT scan with IV contrast of the abdomen and pelvis. [LJ]  Clinical Course User Index [LJ] Rayna Sexton, PA-C   Medical Decision Making Amount and/or Complexity of Data Reviewed Labs: ordered. Radiology: ordered.  Risk Prescription drug management. Decision regarding hospitalization.  Patient is a 81 year old male with a history of colon cancer on hospice care who presents to the emergency department due to weakness in the right arm and hand as well as a fall that occurred.  His care was transferred to me at shift change from [facility].  Please see her note for additional information.  At the time of shift change patient's lab work had resulted and he was pending MRI of the brain.  This is since resulted with findings as noted below:  IMPRESSION:  1. Small acute or subacute infarct in the inferior right cerebellum.  Mild edema without mass effect.  2. Moderate chronic microvascular  disease.   Patient evaluated by neurology who recommends admission as well as further stroke work-up.  Please see their note for additional information.  Staff also incidentally noted a draining abscess along the patient's right hip.  Patient does have a history of abscess in the region.  I have ordered a CT scan abdomen/pelvis with contrast which is pending at this time.  We will discuss with the medicine team for admission.     Rayna Sexton, PA-C 01/04/22 1947    Isla Pence, MD 01/04/22 2009

## 2022-01-04 NOTE — ED Notes (Signed)
Pt transported to CT ?

## 2022-01-04 NOTE — Assessment & Plan Note (Signed)
 #)   Colon cancer: Per chart review, patient no longer pursuing chemotherapy/radiation, including from a palliative standpoint.  He is on hospice, but not comfort care measures only at this time.  Prn IV fentanyl.

## 2022-01-04 NOTE — ED Triage Notes (Addendum)
BIB [facility] after pt's hospice nurse called to report finding pt on the ground. Per EMS, pt did not know what time he had fallen yesterday but thinks it was the evening. PT endorses new onset right hand weakness since this AM, but does not know what time it started. Denies hitting head.    HX: colon cancer

## 2022-01-05 ENCOUNTER — Observation Stay (HOSPITAL_COMMUNITY): Payer: Medicare Other

## 2022-01-05 DIAGNOSIS — I639 Cerebral infarction, unspecified: Secondary | ICD-10-CM | POA: Diagnosis present

## 2022-01-05 DIAGNOSIS — I69341 Monoplegia of lower limb following cerebral infarction affecting right dominant side: Secondary | ICD-10-CM | POA: Diagnosis not present

## 2022-01-05 DIAGNOSIS — L02415 Cutaneous abscess of right lower limb: Secondary | ICD-10-CM | POA: Diagnosis present

## 2022-01-05 DIAGNOSIS — F17210 Nicotine dependence, cigarettes, uncomplicated: Secondary | ICD-10-CM | POA: Diagnosis present

## 2022-01-05 DIAGNOSIS — D509 Iron deficiency anemia, unspecified: Secondary | ICD-10-CM | POA: Diagnosis present

## 2022-01-05 DIAGNOSIS — W06XXXA Fall from bed, initial encounter: Secondary | ICD-10-CM | POA: Diagnosis present

## 2022-01-05 DIAGNOSIS — R64 Cachexia: Secondary | ICD-10-CM | POA: Diagnosis present

## 2022-01-05 DIAGNOSIS — E43 Unspecified severe protein-calorie malnutrition: Secondary | ICD-10-CM | POA: Diagnosis present

## 2022-01-05 DIAGNOSIS — R1084 Generalized abdominal pain: Secondary | ICD-10-CM | POA: Diagnosis not present

## 2022-01-05 DIAGNOSIS — C180 Malignant neoplasm of cecum: Secondary | ICD-10-CM | POA: Diagnosis present

## 2022-01-05 DIAGNOSIS — R140 Abdominal distension (gaseous): Secondary | ICD-10-CM | POA: Diagnosis not present

## 2022-01-05 DIAGNOSIS — Z515 Encounter for palliative care: Secondary | ICD-10-CM | POA: Diagnosis not present

## 2022-01-05 DIAGNOSIS — C182 Malignant neoplasm of ascending colon: Secondary | ICD-10-CM | POA: Diagnosis present

## 2022-01-05 DIAGNOSIS — Y92009 Unspecified place in unspecified non-institutional (private) residence as the place of occurrence of the external cause: Secondary | ICD-10-CM | POA: Diagnosis not present

## 2022-01-05 DIAGNOSIS — Z66 Do not resuscitate: Secondary | ICD-10-CM | POA: Diagnosis present

## 2022-01-05 DIAGNOSIS — K219 Gastro-esophageal reflux disease without esophagitis: Secondary | ICD-10-CM | POA: Diagnosis present

## 2022-01-05 DIAGNOSIS — C189 Malignant neoplasm of colon, unspecified: Secondary | ICD-10-CM | POA: Diagnosis not present

## 2022-01-05 DIAGNOSIS — I6389 Other cerebral infarction: Secondary | ICD-10-CM | POA: Diagnosis not present

## 2022-01-05 DIAGNOSIS — K632 Fistula of intestine: Secondary | ICD-10-CM | POA: Diagnosis present

## 2022-01-05 DIAGNOSIS — I63541 Cerebral infarction due to unspecified occlusion or stenosis of right cerebellar artery: Secondary | ICD-10-CM | POA: Diagnosis present

## 2022-01-05 DIAGNOSIS — A419 Sepsis, unspecified organism: Secondary | ICD-10-CM | POA: Diagnosis present

## 2022-01-05 DIAGNOSIS — R29898 Other symptoms and signs involving the musculoskeletal system: Secondary | ICD-10-CM | POA: Diagnosis present

## 2022-01-05 DIAGNOSIS — E8809 Other disorders of plasma-protein metabolism, not elsewhere classified: Secondary | ICD-10-CM | POA: Diagnosis present

## 2022-01-05 DIAGNOSIS — R29700 NIHSS score 0: Secondary | ICD-10-CM | POA: Diagnosis present

## 2022-01-05 DIAGNOSIS — S143XXA Injury of brachial plexus, initial encounter: Secondary | ICD-10-CM | POA: Diagnosis present

## 2022-01-05 DIAGNOSIS — Z681 Body mass index (BMI) 19 or less, adult: Secondary | ICD-10-CM | POA: Diagnosis not present

## 2022-01-05 DIAGNOSIS — Z79890 Hormone replacement therapy (postmenopausal): Secondary | ICD-10-CM | POA: Diagnosis not present

## 2022-01-05 DIAGNOSIS — L0390 Cellulitis, unspecified: Secondary | ICD-10-CM | POA: Diagnosis present

## 2022-01-05 DIAGNOSIS — I10 Essential (primary) hypertension: Secondary | ICD-10-CM | POA: Diagnosis present

## 2022-01-05 DIAGNOSIS — Z79899 Other long term (current) drug therapy: Secondary | ICD-10-CM | POA: Diagnosis not present

## 2022-01-05 DIAGNOSIS — G629 Polyneuropathy, unspecified: Secondary | ICD-10-CM | POA: Diagnosis present

## 2022-01-05 DIAGNOSIS — E785 Hyperlipidemia, unspecified: Secondary | ICD-10-CM | POA: Diagnosis present

## 2022-01-05 LAB — ECHOCARDIOGRAM COMPLETE
AV Mean grad: 3 mmHg
AV Peak grad: 5.9 mmHg
Ao pk vel: 1.21 m/s
Area-P 1/2: 3.85 cm2
Single Plane A4C EF: 46.7 %

## 2022-01-05 LAB — LIPID PANEL
Cholesterol: 154 mg/dL (ref 0–200)
HDL: 55 mg/dL (ref >40)
LDL Cholesterol: 80 mg/dL (ref 0–99)
Total CHOL/HDL Ratio: 2.8 RATIO
Triglycerides: 94 mg/dL (ref <150)
VLDL: 19 mg/dL (ref 0–40)

## 2022-01-05 LAB — CBC WITH DIFFERENTIAL/PLATELET
Abs Immature Granulocytes: 0.08 10*3/uL — ABNORMAL HIGH (ref 0.00–0.07)
Basophils Absolute: 0 10*3/uL (ref 0.0–0.1)
Basophils Relative: 0 %
Eosinophils Absolute: 0.1 10*3/uL (ref 0.0–0.5)
Eosinophils Relative: 1 %
HCT: 27.3 % — ABNORMAL LOW (ref 39.0–52.0)
Hemoglobin: 8 g/dL — ABNORMAL LOW (ref 13.0–17.0)
Immature Granulocytes: 1 %
Lymphocytes Relative: 11 %
Lymphs Abs: 1.8 10*3/uL (ref 0.7–4.0)
MCH: 19.7 pg — ABNORMAL LOW (ref 26.0–34.0)
MCHC: 29.3 g/dL — ABNORMAL LOW (ref 30.0–36.0)
MCV: 67.1 fL — ABNORMAL LOW (ref 80.0–100.0)
Monocytes Absolute: 0.6 10*3/uL (ref 0.1–1.0)
Monocytes Relative: 4 %
Neutro Abs: 13.9 10*3/uL — ABNORMAL HIGH (ref 1.7–7.7)
Neutrophils Relative %: 83 %
Platelets: 505 10*3/uL — ABNORMAL HIGH (ref 150–400)
RBC: 4.07 MIL/uL — ABNORMAL LOW (ref 4.22–5.81)
RDW: 19.1 % — ABNORMAL HIGH (ref 11.5–15.5)
WBC: 16.5 10*3/uL — ABNORMAL HIGH (ref 4.0–10.5)
nRBC: 0 % (ref 0.0–0.2)

## 2022-01-05 LAB — HEMOGLOBIN A1C
Hgb A1c MFr Bld: 5.7 % — ABNORMAL HIGH (ref 4.8–5.6)
Mean Plasma Glucose: 116.89 mg/dL

## 2022-01-05 LAB — COMPREHENSIVE METABOLIC PANEL
ALT: 11 U/L (ref 0–44)
AST: 21 U/L (ref 15–41)
Albumin: 2.2 g/dL — ABNORMAL LOW (ref 3.5–5.0)
Alkaline Phosphatase: 69 U/L (ref 38–126)
Anion gap: 10 (ref 5–15)
BUN: 10 mg/dL (ref 8–23)
CO2: 24 mmol/L (ref 22–32)
Calcium: 8.5 mg/dL — ABNORMAL LOW (ref 8.9–10.3)
Chloride: 105 mmol/L (ref 98–111)
Creatinine, Ser: 0.69 mg/dL (ref 0.61–1.24)
GFR, Estimated: >60 mL/min (ref >60)
Glucose, Bld: 121 mg/dL — ABNORMAL HIGH (ref 70–99)
Potassium: 3.8 mmol/L (ref 3.5–5.1)
Sodium: 139 mmol/L (ref 135–145)
Total Bilirubin: 0.5 mg/dL (ref 0.3–1.2)
Total Protein: 5.6 g/dL — ABNORMAL LOW (ref 6.5–8.1)

## 2022-01-05 LAB — LACTIC ACID, PLASMA: Lactic Acid, Venous: 3.3 mmol/L (ref 0.5–1.9)

## 2022-01-05 LAB — MAGNESIUM: Magnesium: 1.8 mg/dL (ref 1.7–2.4)

## 2022-01-05 MED ORDER — AMOXICILLIN-POT CLAVULANATE 875-125 MG PO TABS
1.0000 | ORAL_TABLET | Freq: Two times a day (BID) | ORAL | Status: AC
  Administered 2022-01-05 – 2022-01-09 (×9): 1 via ORAL
  Filled 2022-01-05 (×9): qty 1

## 2022-01-05 MED ORDER — SODIUM CHLORIDE 0.9 % IV BOLUS
500.0000 mL | Freq: Once | INTRAVENOUS | Status: AC
  Administered 2022-01-05: 500 mL via INTRAVENOUS

## 2022-01-05 MED ORDER — SODIUM CHLORIDE 0.9 % IV SOLN: INTRAVENOUS | Status: DC

## 2022-01-05 NOTE — Evaluation (Signed)
Speech Language Pathology Evaluation Patient Details Name: Albert Love MRN: [id number] DOB: [birth year] Today's Date: 01/05/2022 Time: 9509-3267 SLP Time Calculation (min) (ACUTE ONLY): 13 min  Problem List:  Patient Active Problem List   Diagnosis Date Noted   Acute ischemic stroke ([place]) 01/04/2022   Colon cancer ([place]) 01/04/2022   HLD (hyperlipidemia) 01/04/2022   Tobacco abuse 01/04/2022   GERD (gastroesophageal reflux disease) 01/04/2022   Intra-abdominal abscess ([place]) 06/19/2021   Sepsis due to cellulitis ([place]) 06/19/2021   Hospice care patient 06/15/2021   Palliative care patient 06/15/2021   Protein-calorie malnutrition, severe 06/07/2021   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   Past Medical History:  Past Medical History:  Diagnosis Date   Acute renal insufficiency 09/05/2019   Colon cancer ([place])    (on hospice)   CRAO (central retinal artery occlusion)    Perforated gastric ulcer s/p omental [name] patch 09/05/2019 09/05/2019   Past Surgical History:  Past Surgical History:  Procedure Laterality Date   BOWEL RESECTION N/A 09/05/2019   Procedure: SMALL BOWEL RESECTION;  Surgeon: Clovis Riley, MD;  Location: [facility];  Service: General;  Laterality: N/A;   IR CATHETER TUBE CHANGE  06/22/2021   IR CATHETER TUBE CHANGE  07/13/2021   IR RADIOLOGIST EVAL & MGMT  07/21/2021   IR RADIOLOGIST EVAL & MGMT  09/01/2021   IR SINUS/FIST TUBE CHK-NON GI  09/19/2019   IR SINUS/FIST TUBE CHK-NON GI  09/19/2019   LAPAROTOMY N/A 09/05/2019   Procedure: EXPLORATORY LAPAROTOMY WITH [facility] AND GASTRIC BIOPSY;  Surgeon: Clovis Riley, MD;  Location: [facility];  Service: General;   Laterality: N/A;   HPI:  Pt is a 81 y.o. male who presented to the ED on 01/04/22 with right upper extremity weakness. MRI brain 5/23: Small acute or subacute infarct in the inferior right cerebellum. Mild edema without mass effect. PMH: colon cancer, on hospice, recurrent left lower quadrant abdominal wound, ischemic CVA with residual right lower extremity weakness, GERD, chronic iron deficiency anemia associated with baseline hemoglobin 8-10, chronic tobacco abuse, hyperlipidemia.   Assessment / Plan / Recommendation Clinical Impression  Pt participated in speech-language-cognition evaluation evaluation. Pt denied any baseline or acute deficits in these areas. [facility] Mental Status Examination was completed to evaluate the pt's cognitive-linguistic skills. His score was adjusted to exclude portions of the evaluation which required writing. He achieved an adjusted score of 24/24. No speech or language deficits were noted and his performance on both formal and informal cognitive-linguistic tasks was within functional limits. Further skilled SLP services are not clinically indicated at this time. Pt was educated regarding this and he verbalized understanding as well as agreement with plan of care.    SLP Assessment  SLP Recommendation/Assessment: Patient does not need any further Speech Hazel Pathology Services SLP Visit Diagnosis: Cognitive communication deficit (R41.841)    Recommendations for follow up therapy are one component of a multi-disciplinary discharge planning process, led by the attending physician.  Recommendations may be updated based on patient status, additional functional criteria and insurance authorization.    Follow Up Recommendations  No SLP follow up    Assistance Recommended at Discharge  None  Functional Status Assessment Patient has not  had a recent decline in their functional status  Frequency and Duration           SLP Evaluation Cognition   Overall Cognitive Status: Within Functional Limits for tasks assessed Arousal/Alertness: Awake/alert Orientation Level: Oriented X4 Year: 2023 Month: May Day of Week: Correct Attention: Focused;Sustained;Selective Focused Attention: Appears intact Sustained Attention: Appears intact Selective Attention: Appears intact Awareness: Appears intact Problem Solving: Appears intact       Comprehension  Auditory Comprehension Overall Auditory Comprehension: Appears within functional limits for tasks assessed Yes/No Questions: Within Functional Limits Commands: Within Functional Limits Conversation: Complex    Expression Expression Primary Mode of Expression: Verbal Verbal Expression Overall Verbal Expression: Appears within functional limits for tasks assessed Initiation: No impairment Level of Generative/Spontaneous Verbalization: Conversation Repetition: No impairment Naming: No impairment Pragmatics: No impairment   Oral / Motor  Oral Motor/Sensory Function Overall Oral Motor/Sensory Function: Within functional limits Motor Speech Overall Motor Speech: Appears within functional limits for tasks assessed Respiration: Within functional limits Phonation: Normal Resonance: Within functional limits Articulation: Within functional limitis Intelligibility: Intelligible Motor Planning: Witnin functional limits           Tessi Eustache I. Hardin Negus, [place], [place] Office number [phone] Pager [place] 01/05/2022, 11:49 AM

## 2022-01-05 NOTE — Progress Notes (Signed)
Occupational Therapy Treatment Patient Details Name: Albert Love MRN: [id number] DOB: 02/18/1941 Today's Date: 01/05/2022   History of present illness 81 y/o male presenting from home with complaint of R UE weakness.  MRI showed a small acute to subacute infarct in the inferior right cerebellum. Per pt he fell off the bed adn laid in an awkward position for 2-3 hours until his hospice nurse helped him back to bed. 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   OT comments  Pt seen to further assess RUE deficits. Pt is weaker in the radial n distribution with complaints of diminished sensation in the radial n distribution as well. Pt has weak digit abduction, however is able to make a functional grasp and pinch pattern. Will benefit from fabrication of a radial nerve palsy splint to increase functional use R hand. Will also  order a static wrist cock-up splint for pt to use for support at night and intermittently during the day when not using functional splint. Pt will need to follow up with OT after DC. Prefer outpt OT if able to be set up with outpt services since he is on Hospice. Will fabricate splint in am.   Recommendations for follow up therapy are one component of a multi-disciplinary discharge planning process, led by the attending physician.  Recommendations may be updated based on patient status, additional functional criteria and insurance authorization.    Follow Up Recommendations  Outpatient OT (if able to receive given pt is on Hospice)    Assistance Recommended at Discharge Intermittent Supervision/Assistance  Patient can return home with the following  A little help with walking and/or transfers;A little help with bathing/dressing/bathroom;Assistance with cooking/housework;Direct supervision/assist for  medications management;Direct supervision/assist for financial management;Assist for transportation;Help with stairs or ramp for entrance   Equipment Recommendations       Recommendations for Other Services      Precautions / Restrictions Precautions Precautions: Fall       Mobility Bed Mobility                    Transfers                         Balance                                           ADL either performed or assessed with clinical judgement   ADL                                              Extremity/Trunk Assessment Upper Extremity Assessment Upper Extremity Assessment: RUE deficits/detail RUE Deficits / Details: RUE AROM WFL with the exception of deficits in the radial nerve motor distribution as well as complaining of diminished sensation in radial nerve distribution. Median n appears intact; weak digit abduction. Ableto make strong fist and oppose thumb to index and middle   Lower Extremity Assessment Lower Extremity Assessment: Overall WFL for tasks assessed (mild R LE weakness, surprisingly no noticeably incoordination.)   Cervical / Trunk Assessment Cervical / Trunk Assessment: Normal  Vision       Perception     Praxis      Cognition Arousal/Alertness: Awake/alert Behavior During Therapy: WFL for tasks assessed/performed Overall Cognitive Status: Within Functional Limits for tasks assessed                                          Exercises      Shoulder Instructions       General Comments very thin appearing    Pertinent Vitals/ Pain       Pain Assessment Faces Pain Scale: No hurt  Home Living Family/patient expects to be discharged to:: Private residence Living Arrangements: Alone Available Help at Discharge: Family;Available PRN/intermittently Type of Home: Apartment Home Access: Elevator     Home Layout: One level     Bathroom Shower/Tub:  Tub/shower unit;Curtain   Bathroom Toilet: Standard Bathroom Accessibility: Yes   Home Equipment: Grab bars - toilet;Grab bars - tub/shower;Rolling Walker (2 wheels);Shower seat   Additional Comments: hospice nurse comes monday & friday  Lives With: Alone    Prior Functioning/Environment              Frequency  Min 3X/week        Progress Toward Goals  OT Goals(current goals can now be found in the care plan section)        Plan Discharge plan remains appropriate    Co-evaluation                 AM-PAC OT "6 Clicks" Daily Activity     Outcome Measure   Help from another person eating meals?: A Little Help from another person taking care of personal grooming?: A Little Help from another person toileting, which includes using toliet, bedpan, or urinal?: A Little Help from another person bathing (including washing, rinsing, drying)?: A Little Help from another person to put on and taking off regular upper body clothing?: A Little Help from another person to put on and taking off regular lower body clothing?: A Little 6 Click Score: 18    End of Session    OT Visit Diagnosis: Unsteadiness on feet (R26.81);Muscle weakness (generalized) (M62.81)   Activity Tolerance Patient tolerated treatment well   Patient Left in bed;with call bell/phone within reach;Other (comment) (working wtih evaluating OT)   Nurse Communication Mobility status;Other (comment) (POC/need for follow up)        Time: 1255-1310 OT Time Calculation (min): 15 min  Charges: OT General Charges $OT Visit: 1 Visit  Maurie Boettcher, OT/L   Acute OT Clinical Specialist [employer] Pager [phone] Office [phone]   [facility] 01/05/2022, 4:04 PM

## 2022-01-05 NOTE — Progress Notes (Signed)
Orthopedic Tech Progress Note Patient Details:  Albert Love [birth year] [id number]  Called in order to [facility] for a [place]   Patient ID: Williom Cedar, male   DOB: 03-06-1941, 81 y.o.   MRN: [id number]  Janit Pagan 01/05/2022, 4:46 PM

## 2022-01-05 NOTE — Progress Notes (Signed)
Physical Therapy Evaluation Patient Details Name: Albert Love MRN: [id number] DOB: 10-02-1940 Today's Date: 01/05/2022  History of Present Illness  pt is a 81 y/o male presenting from home to [facility] 5/23 with complaint of R UE weakness.  MRI showed a small acute to subacute infarct in the inferior right cerebellum.  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  Clinical Impression  Pt admitted with/for s/s of stroke described above with problems found below.  Pt needed minimal to moderate assist for basic mobility and gait.Marland Kitchen  Pt currently limited functionally due to the problems listed below.  (see problems list.)  Pt will benefit from PT to maximize function and safety to be able to get home safely with available assist.        Recommendations for follow up therapy are one component of a multi-disciplinary discharge planning process, led by the attending physician.  Recommendations may be updated based on patient status, additional functional criteria and insurance authorization.  Follow Up Recommendations Home health PT (or oppt)    Assistance Recommended at Discharge PRN (plus hospice)  Patient can return home with the following  A little help with walking and/or transfers;A little help with bathing/dressing/bathroom    Equipment Recommendations None recommended by PT  Recommendations for Other Services       Functional Status Assessment Patient has had a recent decline in their functional status and demonstrates the ability to make significant improvements in function in a reasonable and predictable amount of time.     Precautions / Restrictions Precautions Precautions: Fall      Mobility  Bed Mobility Overal bed mobility: Needs Assistance Bed Mobility: Supine to Sit, Sit to Supine     Supine to sit: Mod assist Sit to supine: Min assist   General bed mobility comments: truncal assist up to EOB  and R LE assist back into bed.    Transfers Overall transfer level: Needs assistance   Transfers: Sit to/from Stand Sit to Stand: Min assist, +2 safety/equipment           General transfer comment: cues for hand placement, stability assist.    Ambulation/Gait Ambulation/Gait assistance: Min assist, +2 physical assistance, +2 safety/equipment Gait Distance (Feet): 15 Feet (x2 to/from bathroom) Assistive device: IV Pole, 1 person hand held assist Gait Pattern/deviations: Step-through pattern       General Gait Details: mildly unsteady gait, limited obs due to pt stumbling around after urinary incontinence, trying to get out of the puddle, wet gown off his legs and to the bathroom quickly.  Pt able to change direction to side step on cue.  Generally min assist.  Stairs            Wheelchair Mobility    Modified Rankin (Stroke Patients Only) Modified Rankin (Stroke Patients Only) Pre-Morbid Rankin Score: No significant disability Modified Rankin: Moderately severe disability     Balance Overall balance assessment: Needs assistance Sitting-balance support: Single extremity supported, No upper extremity supported, Feet supported Sitting balance-Leahy Scale: Good Sitting balance - Comments: able to down socks at EOB   Standing balance support: Single extremity supported, No upper extremity supported, During functional activity Standing balance-Leahy Scale: Poor Standing balance comment: reliant on external support or UE's                             Pertinent Vitals/Pain Pain Assessment Pain Assessment:  Faces Faces Pain Scale: No hurt Pain Intervention(s): Monitored during session    Home Living Family/patient expects to be discharged to:: Private residence Living Arrangements: Alone Available Help at Discharge: Family;Available PRN/intermittently Type of Home: Apartment Home Access: Elevator       Home Layout: One level Home Equipment: Grab  bars - toilet;Grab bars - tub/shower;Rolling Walker (2 wheels);Shower seat Additional Comments: hospice nurse comes monday & friday    Prior Function Prior Level of Function : Independent/Modified Independent;Patient poor historian/Family not available             Mobility Comments: no AD use, RW for community mobility ADLs Comments: uses taxi/cab services to get around community     Hand Dominance   Dominant Hand: Right    Extremity/Trunk Assessment   Upper Extremity Assessment Upper Extremity Assessment: Defer to OT evaluation    Lower Extremity Assessment Lower Extremity Assessment: Overall WFL for tasks assessed (mild R LE weakness, surprisingly no noticeably incoordination.)    Cervical / Trunk Assessment Cervical / Trunk Assessment: Normal  Communication   Communication: No difficulties  Cognition Arousal/Alertness: Awake/alert Behavior During Therapy: WFL for tasks assessed/performed Overall Cognitive Status: Within Functional Limits for tasks assessed                                          General Comments      Exercises     Assessment/Plan    PT Assessment Patient needs continued PT services  PT Problem List Decreased strength;Decreased activity tolerance;Decreased mobility;Decreased coordination;Decreased knowledge of use of DME       PT Treatment Interventions DME instruction;Gait training;Functional mobility training;Therapeutic activities;Balance training;Patient/family education    PT Goals (Current goals can be found in the Care Plan section)  Acute Rehab PT Goals Patient Stated Goal: let's get on with it and out of here. PT Goal Formulation: With patient Time For Goal Achievement: 01/20/22 Potential to Achieve Goals: Good    Frequency Min 3X/week     Co-evaluation               AM-PAC PT "6 Clicks" Mobility  Outcome Measure Help needed turning from your back to your side while in a flat bed without using  bedrails?: A Little Help needed moving from lying on your back to sitting on the side of a flat bed without using bedrails?: A Lot Help needed moving to and from a bed to a chair (including a wheelchair)?: A Little Help needed standing up from a chair using your arms (e.g., wheelchair or bedside chair)?: A Little Help needed to walk in hospital room?: A Little Help needed climbing 3-5 steps with a railing? : A Lot 6 Click Score: 16    End of Session   Activity Tolerance: Patient tolerated treatment well Patient left: in bed;with call bell/phone within reach;with nursing/sitter in room;with family/visitor present;Other (comment) (in bed for dressing change)   PT Visit Diagnosis: Unsteadiness on feet (R26.81);Muscle weakness (generalized) (M62.81)    Time: 1324-4010 PT Time Calculation (min) (ACUTE ONLY): 17 min   Charges:   PT Evaluation $PT Eval Moderate Complexity: 1 Mod          01/05/2022  Ginger Carne., PT Acute Rehabilitation Services [phone]  (pager) [phone]  (office)  Tessie Fass Daisa Stennis 01/05/2022, 3:11 PM

## 2022-01-05 NOTE — Evaluation (Addendum)
Occupational Therapy Evaluation Patient Details Name: Albert Love MRN: [id number] DOB: [birth year] Today's Date: 01/05/2022   History of Present Illness 81 y/o male presenting from home with complaint of R UE weakness.  MRI showed a small acute to subacute infarct in the inferior right cerebellum.  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   Clinical Impression   Pt independent at baseline with ADLs, reports using RW for community mobility. Pt lives alone, has family nearby who can provide assist if needed, and help with transportation, and a hospice nurse that comes 2x/week. Pt currently presenting with RUE/RLE weakness, pt min-mod A for bed mobility, min A+2 for transfers and in room ambulation without AD, and min guard-max A for ADLs. Plan to provide pt with R wrist splint next session (see progress note). Pt presenting with impairments listed below, will follow acutely. Recommend OP OT if able given pt is on hospice.     Recommendations for follow up therapy are one component of a multi-disciplinary discharge planning process, led by the attending physician.  Recommendations may be updated based on patient status, additional functional criteria and insurance authorization.   Follow Up Recommendations  Outpatient OT (if able to recieve given pt is on hospice)    Assistance Recommended at Discharge Intermittent Supervision/Assistance  Patient can return home with the following A little help with walking and/or transfers;A little help with bathing/dressing/bathroom;Assistance with cooking/housework;Direct supervision/assist for medications management;Direct supervision/assist for financial management;Assist for transportation;Help with stairs or ramp for entrance    Functional Status Assessment  Patient has had a  recent decline in their functional status and demonstrates the ability to make significant improvements in function in a reasonable and predictable amount of time.  Equipment Recommendations  None recommended by OT;Other (comment) (pt has all necessary DME)    Recommendations for Other Services PT consult     Precautions / Restrictions Precautions Precautions: Fall Restrictions Weight Bearing Restrictions: No      Mobility Bed Mobility Overal bed mobility: Needs Assistance Bed Mobility: Supine to Sit, Sit to Supine     Supine to sit: Mod assist Sit to supine: Min assist   General bed mobility comments: truncal assist up to EOB and R LE assist back into bed.    Transfers Overall transfer level: Needs assistance   Transfers: Sit to/from Stand Sit to Stand: Min assist, +2 safety/equipment           General transfer comment: cues for hand placement, stability assist.      Balance Overall balance assessment: Needs assistance Sitting-balance support: Single extremity supported, No upper extremity supported, Feet supported Sitting balance-Leahy Scale: Good Sitting balance - Comments: can reach outside BOS without LOB   Standing balance support: Single extremity supported, No upper extremity supported, During functional activity Standing balance-Leahy Scale: Poor Standing balance comment: reliant on external support or UE's                           ADL either performed or assessed with clinical judgement   ADL Overall ADL's : Needs assistance/impaired Eating/Feeding: Modified independent;Sitting   Grooming: Wash/dry hands;Modified independent;Standing Grooming Details (indicate cue type and reason): completed standing at sink Upper Body Bathing: Modified independent;Sitting   Lower Body Bathing: Minimal assistance;Sitting/lateral leans Lower Body Bathing Details (indicate cue  type and reason): washing L leg while sitting EOB\ Upper Body Dressing : Min  guard;Sitting   Lower Body Dressing: Maximal assistance;Bed level Lower Body Dressing Details (indicate cue type and reason): to don socks Toilet Transfer: Nature conservation officer;Ambulation   Toileting- Clothing Manipulation and Hygiene: Min guard;Sit to/from stand Toileting - Clothing Manipulation Details (indicate cue type and reason): for clothing mgmt, completed toileting in standing     Functional mobility during ADLs: Min guard;Cueing for safety       Vision Baseline Vision/History: 1 Wears glasses Ability to See in Adequate Light: 0 Adequate Patient Visual Report: No change from baseline Vision Assessment?: No apparent visual deficits     Perception     Praxis      Pertinent Vitals/Pain Pain Assessment Pain Assessment: Faces Pain Score: 0-No pain Pain Intervention(s): Monitored during session, Repositioned     Hand Dominance Right   Extremity/Trunk Assessment Upper Extremity Assessment Upper Extremity Assessment: RUE deficits/detail RUE Deficits / Details: RUE AROM WFL with the exception of deficits in the radial nerve motor distribution as well as complaining of diminished sensation in radial nerve distribution. Median n appears intact; weak digit abduction. Ableto make strong fist and oppose thumb to index and middle RUE Coordination: decreased fine motor;decreased gross motor   Lower Extremity Assessment Lower Extremity Assessment: Defer to PT evaluation   Cervical / Trunk Assessment Cervical / Trunk Assessment: Normal   Communication Communication Communication: No difficulties   Cognition Arousal/Alertness: Awake/alert Behavior During Therapy: WFL for tasks assessed/performed Overall Cognitive Status: Within Functional Limits for tasks assessed                                       General Comments  family in room, present and supportive    Exercises     Shoulder Instructions      Home Living Family/patient expects to be  discharged to:: Private residence Living Arrangements: Alone Available Help at Discharge: Family;Available PRN/intermittently (son) Type of Home: Apartment Home Access: Elevator (7th floor)     Home Layout: One level     Bathroom Shower/Tub: Tub/shower unit;Curtain   Bathroom Toilet: Standard Bathroom Accessibility: Yes   Home Equipment: Grab bars - toilet;Grab bars - tub/shower;Rolling Walker (2 wheels);Shower seat   Additional Comments: hospice nurse comes monday & friday to assist with dressing changes for RLQ  Lives With: Alone    Prior Functioning/Environment Prior Level of Function : Independent/Modified Independent;Patient poor historian/Family not available             Mobility Comments: no AD use, RW for community mobility ADLs Comments: uses taxi/cab services to get around community        OT Problem List: Decreased strength;Decreased range of motion;Decreased activity tolerance;Impaired balance (sitting and/or standing);Impaired UE functional use      OT Treatment/Interventions: Self-care/ADL training;Therapeutic exercise;Splinting;Modalities;DME and/or AE instruction;Energy conservation;Therapeutic activities;Patient/family education;Balance training;Cognitive remediation/compensation;Manual therapy;Neuromuscular education    OT Goals(Current goals can be found in the care plan section) Acute Rehab OT Goals Patient Stated Goal: to get better OT Goal Formulation: With patient Time For Goal Achievement: 01/19/22 Potential to Achieve Goals: Good ADL Goals Pt Will Perform Lower Body Dressing: with supervision;sit to/from stand;sitting/lateral leans Pt Will Transfer to Toilet: with supervision;ambulating;regular height toilet Pt Will Perform Tub/Shower Transfer: with supervision;shower seat;ambulating;Shower transfer;Tub transfer Additional ADL Goal #1: Pt will be able to demonstrate donning/doffing of splint and verbalize wear schedule  in order to support RUE  during functional tasks  OT Frequency: Min 3X/week    Co-evaluation PT/OT/SLP Co-Evaluation/Treatment: Yes Reason for Co-Treatment: Complexity of the patient's impairments (multi-system involvement);To address functional/ADL transfers   OT goals addressed during session: ADL's and self-care;Strengthening/ROM      AM-PAC OT "6 Clicks" Daily Activity     Outcome Measure Help from another person eating meals?: A Little Help from another person taking care of personal grooming?: A Little Help from another person toileting, which includes using toliet, bedpan, or urinal?: A Little Help from another person bathing (including washing, rinsing, drying)?: A Lot Help from another person to put on and taking off regular upper body clothing?: A Little Help from another person to put on and taking off regular lower body clothing?: A Lot 6 Click Score: 16   End of Session Equipment Utilized During Treatment: Gait belt Nurse Communication: Mobility status;Other (comment) (notified pt's dressing is leaking)  Activity Tolerance: Patient tolerated treatment well Patient left: in bed;with call bell/phone within reach;with bed alarm set;with nursing/sitter in room (pt back in bed for dressing change)  OT Visit Diagnosis: Unsteadiness on feet (R26.81);Muscle weakness (generalized) (M62.81)                Time: 3790-2409 OT Time Calculation (min): 32 min Charges:  OT General Charges $OT Visit: 1 Visit OT Evaluation $OT Eval Moderate Complexity: [address], OTD, OTR/L Acute Rehab [phone]) 832 - [place] 01/05/2022, 4:55 PM

## 2022-01-05 NOTE — Progress Notes (Signed)
Received page from [facility] hospitalist that they admitted this clinic patient of ours yesterday to [facility] for stroke.  Family medicine teaching service will take over this patient's care starting 5/25 at 0700.   If there are any questions or concerns, please page [phone].   Ezequiel Essex, MD

## 2022-01-05 NOTE — Progress Notes (Signed)
Patient had abd pad with dry gauze to abdomen where fecal matter draining. RN unable to obtain ostomy supplies. Fecal rectal pouch applied with skin barrier.

## 2022-01-05 NOTE — Progress Notes (Signed)
Patient lactic acid 3.3 this morning.  Will report off to day shift nurse and page MD.

## 2022-01-05 NOTE — Progress Notes (Addendum)
STROKE TEAM PROGRESS NOTE   INTERVAL HISTORY No family at the bedside.  Patient just finished eval with speech therapy.  He is having weakness in his right hand with some wrist drop.  OT will get him a splint to support his wrist, hand, and fingers. PT/OT evaluation. Consider outpatient EMGs if he is able to go to outpatient appointments.  MRI scan of the brain small right cerebellar acute infarct which cannot explain patient's right hand weakness.  Patient does admit that he fell off the bed on the ground and may have been lying in an awkward position.  Vitals:   01/04/22 2130 01/04/22 2200 01/04/22 2232 01/05/22 0352  BP: 131/88 133/84 135/75 136/74  Pulse: 91 92 86 (!) 104  Resp: '14 16 18 17  '$ Temp:   98.4 F (36.9 C) 98.9 F (37.2 C)  TempSrc:   Oral Oral  SpO2: 100%  100% 100%   CBC:  Recent Labs  Lab 01/04/22 1141 01/05/22 0347  WBC 16.8* 16.5*  NEUTROABS 14.8* 13.9*  HGB 8.4* 8.0*  HCT 27.8* 27.3*  MCV 66.8* 67.1*  PLT 481* 098*   Basic Metabolic Panel:  Recent Labs  Lab 01/04/22 1141 01/04/22 2100 01/05/22 0347  NA 139  --  139  K 3.8  --  3.8  CL 103  --  105  CO2 28  --  24  GLUCOSE 109*  --  121*  BUN 10  --  10  CREATININE 0.80  --  0.69  CALCIUM 8.7*  --  8.5*  MG  --  1.9 1.8   Lipid Panel:  Recent Labs  Lab 01/05/22 0347  CHOL 154  TRIG 94  HDL 55  CHOLHDL 2.8  VLDL 19  LDLCALC 80   HgbA1c:  Recent Labs  Lab 01/05/22 0347  HGBA1C 5.7*   Urine Drug Screen: No results for input(s): LABOPIA, COCAINSCRNUR, LABBENZ, AMPHETMU, THCU, LABBARB in the last 168 hours.  Alcohol Level No results for input(s): ETH in the last 168 hours.  IMAGING past 24 hours CT ANGIO HEAD NECK W WO CM  Result Date: 01/04/2022 CLINICAL DATA:  Found on the ground, new onset right hand weakness, infarct in the right cerebellum on same-day MRI EXAM: CT ANGIOGRAPHY HEAD AND NECK TECHNIQUE: Multidetector CT imaging of the head and neck was performed using the standard  protocol during bolus administration of intravenous contrast. Multiplanar CT image reconstructions and MIPs were obtained to evaluate the vascular anatomy. Carotid stenosis measurements (when applicable) are obtained utilizing NASCET criteria, using the distal internal carotid diameter as the denominator. RADIATION DOSE REDUCTION: This exam was performed according to the departmental dose-optimization program which includes automated exposure control, adjustment of the mA and/or kV according to patient size and/or use of iterative reconstruction technique. CONTRAST:  185m OMNIPAQUE IOHEXOL 350 MG/ML SOLN COMPARISON:  10/19/2020 CTA head and neck, correlation is also made with 01/04/2022 CT head FINDINGS: CT HEAD FINDINGS For noncontrast findings, please see same day CT head. CTA NECK FINDINGS Aortic arch: Standard branching. Imaged portion shows no evidence of aneurysm or dissection. No significant stenosis of the major arch vessel origins. Aortic atherosclerosis. Right carotid system: No evidence of dissection, occlusion, or hemodynamically significant stenosis (greater than 50%). Calcifications at the bifurcation in the proximal right ICA are not hemodynamically significant. Left carotid system: No evidence of dissection, occlusion, or hemodynamically significant stenosis (greater than 50%). Calcifications at the bifurcation and in the proximal left ICA are not hemodynamically significant. Vertebral arteries: No  evidence of dissection, occlusion, or hemodynamically significant stenosis (greater than 50%). Skeleton: No acute osseous abnormality. Large anterior osteophytes C2-C4, which indent the posterior aspect of the pharynx. Other neck: Hypoenhancing left thyroid nodule, which measures up to 1.4 cm, unchanged from the prior exam, for which no follow-up is indicated. (Reference: J Am Coll Radiol. 2015 Feb;12(2): 143-50) Upper chest: Centrilobular and paraseptal emphysema. No focal pulmonary opacity or pleural  effusion. Review of the MIP images confirms the above findings CTA HEAD FINDINGS Anterior circulation: Both internal carotid arteries are patent to the termini, without significant stenosis. A1 segments patent. Normal anterior communicating artery. Anterior cerebral arteries are patent to their distal aspects. No M1 stenosis or occlusion. Normal MCA bifurcations. Distal MCA branches perfused and symmetric. Posterior circulation: Vertebral arteries patent to the vertebrobasilar junction without stenosis. Posterior inferior cerebral arteries patent bilaterally. Basilar patent to its distal aspect. Superior cerebellar arteries patent bilaterally. Patent P1 segments. PCAs perfused to their distal aspects without stenosis. The left posterior communicating artery is visualized. Venous sinuses: As permitted by contrast timing, patent. Anatomic variants: None significant. Review of the MIP images confirms the above findings IMPRESSION: 1.  No intracranial large vessel occlusion or significant stenosis. 2.  No hemodynamically significant stenosis in the neck. 3. Aortic Atherosclerosis (ICD10-I70.0) and Emphysema (ICD10-J43.9). Electronically Signed   By: Merilyn Baba M.D.   On: 01/04/2022 21:12   CT Head Wo Contrast  Result Date: 01/04/2022 CLINICAL DATA:  Neuro deficit, acute, stroke suspected probable subacute cva vs metastatic disease EXAM: CT HEAD WITHOUT CONTRAST TECHNIQUE: Contiguous axial images were obtained from the base of the skull through the vertex without intravenous contrast. RADIATION DOSE REDUCTION: This exam was performed according to the departmental dose-optimization program which includes automated exposure control, adjustment of the mA and/or kV according to patient size and/or use of iterative reconstruction technique. COMPARISON:  CT October 19, 2020. FINDINGS: Brain: Small age indeterminate infarct in the right cerebellum. Remote appearing infarct in the right corona radiata. No evidence of acute  hemorrhage, hydrocephalus, extra-axial collection or mass lesion/mass effect. Moderate patchy white matter hypodensities, nonspecific but compatible with chronic microvascular ischemic disease. Vascular: No hyperdense vessel identified. Calcific intracranial sclerosis. Skull: No acute fracture. Sinuses/Orbits: Clear sinuses.  No acute orbital findings. Other: No mastoid effusions. IMPRESSION: 1. Small age indeterminate infarct in the right cerebellum, new since 2022. Recommend MRI with contrast for far more sensitive evaluation for acute infarct and metastatic disease. 2. Moderate chronic microvascular disease and remote appearing right corona radiata infarct Electronically Signed   By: Margaretha Sheffield M.D.   On: 01/04/2022 14:19   MR Brain W and Wo Contrast  Result Date: 01/04/2022 CLINICAL DATA:  Neuro deficit, acute, stroke suspected EXAM: MRI HEAD WITHOUT AND WITH CONTRAST TECHNIQUE: Multiplanar, acute multiecho pulse sequences of the brain and surrounding structures were obtained without and with intravenous contrast. CONTRAST:  31m GADAVIST GADOBUTROL 1 MMOL/ML IV SOLN COMPARISON:  Same day CT head.  MRI head October 19, 2020. FINDINGS: Brain: Small acute or subacute infarct in the inferior right cerebellum (series 3, image 12), which is nonenhancing. Mild edema without mass effect. Moderate additional scattered T2/FLAIR hyperintensities in the white matter, nonspecific but compatible with chronic microvascular ischemic disease. No pathologic enhancement. No evidence of acute hemorrhage hydrocephalus, mass lesion, extra-axial fluid collection, or midline shift. Vascular: Major arterial flow voids are maintained at the skull base. Skull and upper cervical spine: Normal marrow signal. Partially imaged degenerative changes in the cervical spine. Sinuses/Orbits: Largely  clear sinuses.  No acute orbital findings. Other: No sizable mastoid effusions. IMPRESSION: 1. Small acute or subacute infarct in the inferior  right cerebellum. Mild edema without mass effect. 2. Moderate chronic microvascular disease. Electronically Signed   By: Margaretha Sheffield M.D.   On: 01/04/2022 17:04   CT ABDOMEN PELVIS W CONTRAST  Result Date: 01/04/2022 CLINICAL DATA:  Found on the ground. EXAM: CT ABDOMEN AND PELVIS WITH CONTRAST TECHNIQUE: Multidetector CT imaging of the abdomen and pelvis was performed using the standard protocol following bolus administration of intravenous contrast. RADIATION DOSE REDUCTION: This exam was performed according to the departmental dose-optimization program which includes automated exposure control, adjustment of the mA and/or kV according to patient size and/or use of iterative reconstruction technique. CONTRAST:  121m OMNIPAQUE IOHEXOL 350 MG/ML SOLN COMPARISON:  November 05, 2021 FINDINGS: Lower chest: No acute abnormality. Hepatobiliary: A stable 10 mm focus of well-defined low attenuation is seen within the posterior aspect of the left lobe of the liver. No gallstones, gallbladder wall thickening, or biliary dilatation. Pancreas: Unremarkable. No pancreatic ductal dilatation or surrounding inflammatory changes. Spleen: Normal in size without focal abnormality. Adrenals/Urinary Tract: Adrenal glands are unremarkable. Kidneys are normal, without renal calculi, focal lesion, or hydronephrosis. Bladder is unremarkable. Stomach/Bowel: Stomach and small bowel are within normal limits. A 10.8 cm x 8.5 cm x 15.2 cm complex, heterogeneous mass seen involving the cecum and proximal to mid ascending colon. This is increased in size when compared to the prior study (measures approximately 10.8 cm x 8.5 cm x 15.2 cm) and extends inferiorly along the anterior aspect of the right iliac bone, with invasion of the anterior wall of the right hip. Vascular/Lymphatic: Aortic atherosclerosis. No enlarged abdominal or pelvic lymph nodes. Reproductive: The prostate gland is markedly enlarged and heterogeneous in appearance.  This extends in to the base of the urinary bladder and is stable in appearance when compared to the prior study. Other: A mild amount of abdominopelvic free fluid is seen. Musculoskeletal: Multilevel degenerative changes are seen throughout the lumbar spine. IMPRESSION: 1. Interval increase in size of the complex, heterogeneous mass involving the cecum and proximal to mid ascending colon, as described above. This is consistent with a primary colon malignancy. 2. Marked severity prostatomegaly, stable in appearance when compared to the prior study. 3. Mild amount of abdominopelvic free fluid. 4. Small, stable hepatic cyst or hemangioma. 5. Aortic atherosclerosis. Aortic Atherosclerosis (ICD10-I70.0). Electronically Signed   By: TVirgina NorfolkM.D.   On: 01/04/2022 21:13   DG Chest Port 1 View  Result Date: 01/04/2022 CLINICAL DATA:  Provided history: Weakness. Additional history provided: Patient found down. Right hand weakness. EXAM: PORTABLE CHEST 1 VIEW COMPARISON:  Prior chest radiographs 06/19/2021 and earlier. FINDINGS: Heart size within normal limits. No appreciable airspace consolidation. No evidence of pleural effusion or pneumothorax. No acute bony abnormality identified. IMPRESSION: No evidence of acute cardiopulmonary abnormality. Electronically Signed   By: KKellie SimmeringD.O.   On: 01/04/2022 11:40    PHYSICAL EXAM  Physical Exam  Constitutional: Thin pleasant elderly male  Neuro: Mental Status: Patient is awake, alert, oriented to person, place, month, year, and situation. Patient is able to give a clear and coherent history. No signs of aphasia or neglect Cranial Nerves: II: Visual Fields are full. Pupils are equal, round, and reactive to light.   III,IV, VI: EOMI without ptosis or diploplia.  V: Facial sensation is symmetric to temperature VII: Facial movement is symmetric resting and smiling VIII: Hearing  is intact to voice X: Palate elevates symmetrically XI: Shoulder shrug  is symmetric. XII: Tongue protrudes midline without atrophy or fasciculations.  Motor: Tone is normal. Thin, cachexia     Right  Left Shoulder Abduction  5/5  5/5 Elbow Flexion   4/5  5/5 Elbow Extension  4/5  5/5 Wrist Flexion   4/5  5/5 Wrist Extension  3/5  5/5 Interossei   1/5  5/5 Finger Extension  3/5  5/5 Finger Flexion   3/5  5/5  Right lower Extremity 3/5 Left lower extremity 4/5 with drift  Sensory: Sensation is symmetric to light touch and temperature in the arms and legs. No extinction to DSS present.  Tinel's sign is negative over the right radial groove or ulnar groove.  ASSESSMENT/PLAN Mr. Albert Love is a 81 y.o. male with history of colon cancer, recurrent LLQ abdominal wound, previous CVA, GERD, iron deficiency anemia, smoker, HLD presenting with right upper extremity weakness. He does report a fall, however he does not know how he fell. Symptoms are consistent with a brachial plexus injury or a radial nerve palsy, however he does appear to have some ulnar nerve impairment as well. ASA '81mg'$  initiated due to stroke finding. 2D echo pending.   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  Right hand weakness predominantly in the radial nerve distribution muscles and to a lesser extent in the ulnar muscles as well possibly from brachial plexus injury from the fall Code Stroke CT head small age indeterminate infarct in the right cerebellum, microvascular disease  CTA head & neck No LVO or significant stenosis  MRI  small acute or subacute infarct in the inferior right cerebellum  2D Echo pending LDL 80 HgbA1c 5.7 VTE prophylaxis - None    Diet   Diet Heart Room service appropriate? Yes; Fluid consistency: Thin   No antithrombotic prior to admission, now on aspirin 81  mg daily.  Therapy recommendations:  pending Disposition:  pending  Radial Nerve Palsy/Brachial plexus injury OT to place a splint  Recommend outpatient EMGs  Hypertension Home meds:  None Stable Permissive hypertension (OK if < 220/120) but gradually normalize in 5-7 days Long-term BP goal normotensive  Hyperlipidemia Home meds:  Atorvastatin '80mg'$ , resumed in hospital LDL 80, goal < 70 Continue statin at discharge  Other Stroke Risk Factors Advanced Age >/= 39  Hx stroke/TIA  Other Active Problems Sepsis with recurrent wounds Colon cancer  On hospice care at home  Protein calorie malnutrition Continue nutritional supplementation  Hospital day # 0  Patient seen and examined by NP/APP with MD. MD to update note as needed.   Janine Ores, DNP, FNP-BC [facility] Pager: [phone]  STROKE MD NOTE : I have personally obtained history,examined this patient, reviewed notes, independently viewed imaging studies, participated in medical decision making and plan of care.ROS completed by me personally and pertinent positives fully documented  I have made any additions or clarifications directly to the above note. Agree with note above.  Patient presented today with sudden onset of weakness of the right wrist and hand following a fall in his sleep.  Pattern of weakness suggests primarily radial nerve injury but some component from ulnar nerve mediated muscles as well likely represents mild brachial plexus injury.  He may benefit with outpatient MRI and nerve conduction study.  Doubt stroke  as MRI is negative for any left hemispheric infarct.  The small right cerebellar infarct seen on MRI is clinically silent and possibly from small vessel disease or colon cancer related hypercoagulability.  Recommend continue ongoing stroke work-up.  Aspirin for stroke prevention and aggressive risk factor modification.  Continue ongoing physical and Occupational Therapy.  Greater than 50%  time during this 50-minute visit was spent on counseling and coordination of care about his hand weakness and discussion about evaluation and treatment and answered questions.  Antony Contras, MD Medical Director [facility] Pager: [phone] 01/05/2022 12:58 PM   To contact Stroke Continuity provider, please refer to [email or web address] After hours, contact General Neurology

## 2022-01-05 NOTE — Progress Notes (Signed)
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 pylori + perforated gastric ulcer + [name] patch 09/05/2019 status post surgery Dr. 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 IV antibiotics changed to Augmentin twice daily would complete on 5/28 Cut back saline to 75 cc an hour I am not overtly concerned about infectious process [had just received ~1 week of oral ABX PTA in OV setting] I think there is some component of acidosis from his underlying cancer physiology also--he appears non toxic and well wound care nurse to weigh in --teach how to manage his drainage with ostomy care --he is not an operative candidate We will check labs in the morning 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 retinal arterial occlusion 10/21/2020 Continuing ASA 81 mg at this time Patient ideally should stop using cocaine Permissive hypertension use only as needed labetalol for blood pressure above 875 [id number] diastolic [please consider transition to clonidine + ACE-1 if u do not feel he will adhere to Cocaine cessation] Neurology feels that his pattern of weakness 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 Leonie Man  I had a good discussion with the patient at the bedside discussing the goals of what he feels hospice offers him-he feels that he would like to continue to try and treat the treatable and I have asked that social work reach out to him and his children who lives in [place] and come up formulate a plan for him to get outpatient therapy versus going to his daughter's house with hospice following -this can be decided this in the morning but I think that he may stabilize in 24 hours and likely be able to discharge home    DVT prophylaxis: SCD Code Status: DNR confirmed at the bedside Family Communication: None Disposition:  Status is: Observation The patient will require care spanning > 2 midnights and should be moved to inpatient because:   Will need further work-up and management-need to ensure safe discharge as lives alone and will need to coordinate next steps and planning   Consultants:  Neurology  Procedures:   Antimicrobials:     Subjective: Awake coherent pleasant very friendly Has no pain no fever still has limitation to his right arm with carpopedal type spasm of the hand itself Lower extremities are fine power is good   Objective: Vitals:   01/04/22 2130 01/04/22 2200 01/04/22 2232 01/05/22 0352  BP: 131/88 133/84 135/75 136/74  Pulse: 91 92 86 (!) 104  Resp: '14 16 18 17  '$ Temp:   98.4 F (36.9 C) 98.9 F (37.2 C)  TempSrc:   Oral Oral  SpO2: 100%  100% 100%    Intake/Output Summary (Last 24 hours) at 01/05/2022 0743 Last data filed at 01/05/2022 0600 Gross per 24 hour  Intake 480 ml  Output --  Net 480 ml   There were no vitals filed for this visit.  Examination:  EOMI NCAT Deficit to right hand as noted above Power 5/5 in other 3 limbs Abdomen has an area in the  right lower quadrant which has extrusion of the mucosa of the cecum?-It is not expressing any pus at this time  Data Reviewed: personally reviewed   CBC    Component Value Date/Time   WBC 16.5 (H) 01/05/2022 0347   RBC 4.07 (L) 01/05/2022 0347   HGB 8.0 (L) 01/05/2022 0347   HGB 10.1 (L) 01/21/2021 1153   HCT 27.3 (L) 01/05/2022 0347   HCT 33.0 (L) 01/21/2021 1153   PLT 505 (H) 01/05/2022 0347   PLT 340 01/21/2021 1153   MCV 67.1 (L) 01/05/2022 0347   MCV 71 (L) 01/21/2021 1153   MCH 19.7 (L) 01/05/2022 0347   MCHC 29.3 (L) 01/05/2022 0347   RDW 19.1 (H) 01/05/2022 0347   RDW 14.5 01/21/2021 1153   LYMPHSABS 1.8 01/05/2022 0347   LYMPHSABS 2.4 01/21/2021 1153   MONOABS 0.6 01/05/2022 0347   EOSABS 0.1 01/05/2022 0347   EOSABS 0.2 01/21/2021 1153   BASOSABS 0.0 01/05/2022 0347   BASOSABS 0.0 01/21/2021 1153      Latest Ref Rng & Units 01/05/2022    3:47 AM 01/04/2022   11:41 AM 07/09/2021    1:29 PM  CMP  Glucose 70 - 99 mg/dL 121   109   96    BUN 8 - 23 mg/dL '10   10   6    '$ Creatinine 0.61 - 1.24 mg/dL 0.69   0.80   0.67    Sodium 135 - 145 mmol/L 139   139   136    Potassium 3.5 - 5.1 mmol/L 3.8   3.8   4.2    Chloride 98 - 111 mmol/L 105   103   104    CO2 22 - 32 mmol/L '24   28   25    '$ Calcium 8.9 - 10.3 mg/dL 8.5   8.7   8.8    Total Protein 6.5 - 8.1 g/dL 5.6   6.0   5.8    Total Bilirubin 0.3 - 1.2 mg/dL 0.5   0.5   0.7    Alkaline Phos 38 - 126 U/L 69   70   66    AST 15 - 41 U/L '21   16   22    '$ ALT 0 - 44 U/L '11   10   22       '$ Radiology Studies: CT ANGIO HEAD NECK W WO CM  Result Date: 01/04/2022 CLINICAL DATA:  Found on the ground, new onset right hand weakness, infarct in the right cerebellum on same-day MRI EXAM: CT ANGIOGRAPHY HEAD AND NECK TECHNIQUE: Multidetector CT imaging of the head and neck was performed using the standard protocol during bolus administration of intravenous contrast. Multiplanar CT image reconstructions and MIPs were obtained to  evaluate the vascular anatomy. Carotid stenosis measurements (when applicable) are obtained utilizing NASCET criteria, using the distal internal carotid diameter as the denominator. RADIATION DOSE REDUCTION: This exam was performed according to the departmental dose-optimization program which includes automated exposure control, adjustment of the mA and/or kV according to  patient size and/or use of iterative reconstruction technique. CONTRAST:  188m OMNIPAQUE IOHEXOL 350 MG/ML SOLN COMPARISON:  10/19/2020 CTA head and neck, correlation is also made with 01/04/2022 CT head FINDINGS: CT HEAD FINDINGS For noncontrast findings, please see same day CT head. CTA NECK FINDINGS Aortic arch: Standard branching. Imaged portion shows no evidence of aneurysm or dissection. No significant stenosis of the major arch vessel origins. Aortic atherosclerosis. Right carotid system: No evidence of dissection, occlusion, or hemodynamically significant stenosis (greater than 50%). Calcifications at the bifurcation in the proximal right ICA are not hemodynamically significant. Left carotid system: No evidence of dissection, occlusion, or hemodynamically significant stenosis (greater than 50%). Calcifications at the bifurcation and in the proximal left ICA are not hemodynamically significant. Vertebral arteries: No evidence of dissection, occlusion, or hemodynamically significant stenosis (greater than 50%). Skeleton: No acute osseous abnormality. Large anterior osteophytes C2-C4, which indent the posterior aspect of the pharynx. Other neck: Hypoenhancing left thyroid nodule, which measures up to 1.4 cm, unchanged from the prior exam, for which no follow-up is indicated. (Reference: J Am Coll Radiol. 2015 Feb;12(2): 143-50) Upper chest: Centrilobular and paraseptal emphysema. No focal pulmonary opacity or pleural effusion. Review of the MIP images confirms the above findings CTA HEAD FINDINGS Anterior circulation: Both internal carotid  arteries are patent to the termini, without significant stenosis. A1 segments patent. Normal anterior communicating artery. Anterior cerebral arteries are patent to their distal aspects. No M1 stenosis or occlusion. Normal MCA bifurcations. Distal MCA branches perfused and symmetric. Posterior circulation: Vertebral arteries patent to the vertebrobasilar junction without stenosis. Posterior inferior cerebral arteries patent bilaterally. Basilar patent to its distal aspect. Superior cerebellar arteries patent bilaterally. Patent P1 segments. PCAs perfused to their distal aspects without stenosis. The left posterior communicating artery is visualized. Venous sinuses: As permitted by contrast timing, patent. Anatomic variants: None significant. Review of the MIP images confirms the above findings IMPRESSION: 1.  No intracranial large vessel occlusion or significant stenosis. 2.  No hemodynamically significant stenosis in the neck. 3. Aortic Atherosclerosis (ICD10-I70.0) and Emphysema (ICD10-J43.9). Electronically Signed   By: AMerilyn BabaM.D.   On: 01/04/2022 21:12   CT Head Wo Contrast  Result Date: 01/04/2022 CLINICAL DATA:  Neuro deficit, acute, stroke suspected probable subacute cva vs metastatic disease EXAM: CT HEAD WITHOUT CONTRAST TECHNIQUE: Contiguous axial images were obtained from the base of the skull through the vertex without intravenous contrast. RADIATION DOSE REDUCTION: This exam was performed according to the departmental dose-optimization program which includes automated exposure control, adjustment of the mA and/or kV according to patient size and/or use of iterative reconstruction technique. COMPARISON:  CT October 19, 2020. FINDINGS: Brain: Small age indeterminate infarct in the right cerebellum. Remote appearing infarct in the right corona radiata. No evidence of acute hemorrhage, hydrocephalus, extra-axial collection or mass lesion/mass effect. Moderate patchy white matter hypodensities,  nonspecific but compatible with chronic microvascular ischemic disease. Vascular: No hyperdense vessel identified. Calcific intracranial sclerosis. Skull: No acute fracture. Sinuses/Orbits: Clear sinuses.  No acute orbital findings. Other: No mastoid effusions. IMPRESSION: 1. Small age indeterminate infarct in the right cerebellum, new since 2022. Recommend MRI with contrast for far more sensitive evaluation for acute infarct and metastatic disease. 2. Moderate chronic microvascular disease and remote appearing right corona radiata infarct Electronically Signed   By: FMargaretha SheffieldM.D.   On: 01/04/2022 14:19   MR Brain W and Wo Contrast  Result Date: 01/04/2022 CLINICAL DATA:  Neuro deficit, acute, stroke suspected EXAM: MRI HEAD  WITHOUT AND WITH CONTRAST TECHNIQUE: Multiplanar, acute multiecho pulse sequences of the brain and surrounding structures were obtained without and with intravenous contrast. CONTRAST:  27m GADAVIST GADOBUTROL 1 MMOL/ML IV SOLN COMPARISON:  Same day CT head.  MRI head October 19, 2020. FINDINGS: Brain: Small acute or subacute infarct in the inferior right cerebellum (series 3, image 12), which is nonenhancing. Mild edema without mass effect. Moderate additional scattered T2/FLAIR hyperintensities in the white matter, nonspecific but compatible with chronic microvascular ischemic disease. No pathologic enhancement. No evidence of acute hemorrhage hydrocephalus, mass lesion, extra-axial fluid collection, or midline shift. Vascular: Major arterial flow voids are maintained at the skull base. Skull and upper cervical spine: Normal marrow signal. Partially imaged degenerative changes in the cervical spine. Sinuses/Orbits: Largely clear sinuses.  No acute orbital findings. Other: No sizable mastoid effusions. IMPRESSION: 1. Small acute or subacute infarct in the inferior right cerebellum. Mild edema without mass effect. 2. Moderate chronic microvascular disease. Electronically Signed   By:  FMargaretha SheffieldM.D.   On: 01/04/2022 17:04   CT ABDOMEN PELVIS W CONTRAST  Result Date: 01/04/2022 CLINICAL DATA:  Found on the ground. EXAM: CT ABDOMEN AND PELVIS WITH CONTRAST TECHNIQUE: Multidetector CT imaging of the abdomen and pelvis was performed using the standard protocol following bolus administration of intravenous contrast. RADIATION DOSE REDUCTION: This exam was performed according to the departmental dose-optimization program which includes automated exposure control, adjustment of the mA and/or kV according to patient size and/or use of iterative reconstruction technique. CONTRAST:  1055mOMNIPAQUE IOHEXOL 350 MG/ML SOLN COMPARISON:  November 05, 2021 FINDINGS: Lower chest: No acute abnormality. Hepatobiliary: A stable 10 mm focus of well-defined low attenuation is seen within the posterior aspect of the left lobe of the liver. No gallstones, gallbladder wall thickening, or biliary dilatation. Pancreas: Unremarkable. No pancreatic ductal dilatation or surrounding inflammatory changes. Spleen: Normal in size without focal abnormality. Adrenals/Urinary Tract: Adrenal glands are unremarkable. Kidneys are normal, without renal calculi, focal lesion, or hydronephrosis. Bladder is unremarkable. Stomach/Bowel: Stomach and small bowel are within normal limits. A 10.8 cm x 8.5 cm x 15.2 cm complex, heterogeneous mass seen involving the cecum and proximal to mid ascending colon. This is increased in size when compared to the prior study (measures approximately 10.8 cm x 8.5 cm x 15.2 cm) and extends inferiorly along the anterior aspect of the right iliac bone, with invasion of the anterior wall of the right hip. Vascular/Lymphatic: Aortic atherosclerosis. No enlarged abdominal or pelvic lymph nodes. Reproductive: The prostate gland is markedly enlarged and heterogeneous in appearance. This extends in to the base of the urinary bladder and is stable in appearance when compared to the prior study. Other: A  mild amount of abdominopelvic free fluid is seen. Musculoskeletal: Multilevel degenerative changes are seen throughout the lumbar spine. IMPRESSION: 1. Interval increase in size of the complex, heterogeneous mass involving the cecum and proximal to mid ascending colon, as described above. This is consistent with a primary colon malignancy. 2. Marked severity prostatomegaly, stable in appearance when compared to the prior study. 3. Mild amount of abdominopelvic free fluid. 4. Small, stable hepatic cyst or hemangioma. 5. Aortic atherosclerosis. Aortic Atherosclerosis (ICD10-I70.0). Electronically Signed   By: ThVirgina Norfolk.D.   On: 01/04/2022 21:13   DG Chest Port 1 View  Result Date: 01/04/2022 CLINICAL DATA:  Provided history: Weakness. Additional history provided: Patient found down. Right hand weakness. EXAM: PORTABLE CHEST 1 VIEW COMPARISON:  Prior chest radiographs 06/19/2021 and earlier.  FINDINGS: Heart size within normal limits. No appreciable airspace consolidation. No evidence of pleural effusion or pneumothorax. No acute bony abnormality identified. IMPRESSION: No evidence of acute cardiopulmonary abnormality. Electronically Signed   By: Kellie Simmering D.O.   On: 01/04/2022 11:40     Scheduled Meds:  aspirin EC  81 mg Oral Daily   atorvastatin  80 mg Oral Daily   baclofen  5 mg Oral BID   famotidine  20 mg Oral Daily   fentaNYL (SUBLIMAZE) injection  50 mcg Intravenous Once   ferrous sulfate  325 mg Oral Q breakfast   Continuous Infusions:  sodium chloride     cefTRIAXone (ROCEPHIN)  IV Stopped (01/04/22 2231)   sodium chloride     vancomycin       LOS: 0 days   Time spent: 93  Nita Sells, MD [facility] To contact the attending provider between 7A-7P or the covering provider during after hours 7P-7A, please log into the web site [email or web address] and access using universal [place] password for that web site. If you do not have the password, please call the  hospital operator.  01/05/2022, 7:43 AM

## 2022-01-06 DIAGNOSIS — I639 Cerebral infarction, unspecified: Secondary | ICD-10-CM

## 2022-01-06 DIAGNOSIS — K632 Fistula of intestine: Secondary | ICD-10-CM

## 2022-01-06 LAB — LACTIC ACID, PLASMA
Lactic Acid, Venous: 2.6 mmol/L (ref 0.5–1.9)
Lactic Acid, Venous: 2.4 mmol/L (ref 0.5–1.9)

## 2022-01-06 LAB — CBC WITH DIFFERENTIAL/PLATELET
Abs Immature Granulocytes: 0.07 10*3/uL (ref 0.00–0.07)
Basophils Absolute: 0 10*3/uL (ref 0.0–0.1)
Basophils Relative: 0 %
Eosinophils Absolute: 0.1 10*3/uL (ref 0.0–0.5)
Eosinophils Relative: 0 %
HCT: 24.4 % — ABNORMAL LOW (ref 39.0–52.0)
Hemoglobin: 7.6 g/dL — ABNORMAL LOW (ref 13.0–17.0)
Immature Granulocytes: 1 %
Lymphocytes Relative: 11 %
Lymphs Abs: 1.6 10*3/uL (ref 0.7–4.0)
MCH: 20.4 pg — ABNORMAL LOW (ref 26.0–34.0)
MCHC: 31.1 g/dL (ref 30.0–36.0)
MCV: 65.4 fL — ABNORMAL LOW (ref 80.0–100.0)
Monocytes Absolute: 1 10*3/uL (ref 0.1–1.0)
Monocytes Relative: 7 %
Neutro Abs: 11.8 10*3/uL — ABNORMAL HIGH (ref 1.7–7.7)
Neutrophils Relative %: 81 %
Platelets: 446 10*3/uL — ABNORMAL HIGH (ref 150–400)
RBC: 3.73 MIL/uL — ABNORMAL LOW (ref 4.22–5.81)
RDW: 18.8 % — ABNORMAL HIGH (ref 11.5–15.5)
WBC: 14.4 10*3/uL — ABNORMAL HIGH (ref 4.0–10.5)
nRBC: 0 % (ref 0.0–0.2)

## 2022-01-06 LAB — COMPREHENSIVE METABOLIC PANEL
ALT: 10 U/L (ref 0–44)
AST: 15 U/L (ref 15–41)
Albumin: 1.8 g/dL — ABNORMAL LOW (ref 3.5–5.0)
Alkaline Phosphatase: 57 U/L (ref 38–126)
Anion gap: 5 (ref 5–15)
BUN: 7 mg/dL — ABNORMAL LOW (ref 8–23)
CO2: 25 mmol/L (ref 22–32)
Calcium: 7.7 mg/dL — ABNORMAL LOW (ref 8.9–10.3)
Chloride: 107 mmol/L (ref 98–111)
Creatinine, Ser: 0.58 mg/dL — ABNORMAL LOW (ref 0.61–1.24)
GFR, Estimated: >60 mL/min (ref >60)
Glucose, Bld: 116 mg/dL — ABNORMAL HIGH (ref 70–99)
Potassium: 3.5 mmol/L (ref 3.5–5.1)
Sodium: 137 mmol/L (ref 135–145)
Total Bilirubin: 0.7 mg/dL (ref 0.3–1.2)
Total Protein: 4.9 g/dL — ABNORMAL LOW (ref 6.5–8.1)

## 2022-01-06 MED ORDER — ONDANSETRON HCL 4 MG/2ML IJ SOLN
4.0000 mg | Freq: Four times a day (QID) | INTRAMUSCULAR | Status: DC | PRN
  Administered 2022-01-06 – 2022-01-07 (×2): 4 mg via INTRAVENOUS
  Filled 2022-01-06 (×2): qty 2

## 2022-01-06 NOTE — Progress Notes (Signed)
Physical Therapy Treatment Patient Details Name: Albert Love MRN: [id number] DOB: 10-05-1940 Today's Date: 01/06/2022   History of Present Illness 81 y/o male presenting from home with complaint of R UE weakness.  MRI showed a small acute to subacute infarct in the inferior right cerebellum.  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    PT Comments    Steady improvement toward goals.  Emphasis on transitions, sit to stand safety, progression of gait/stamina.    Recommendations for follow up therapy are one component of a multi-disciplinary discharge planning process, led by the attending physician.  Recommendations may be updated based on patient status, additional functional criteria and insurance authorization.  Follow Up Recommendations  Home health PT     Assistance Recommended at Discharge PRN  Patient can return home with the following A little help with walking and/or transfers;A little help with bathing/dressing/bathroom   Equipment Recommendations  None recommended by PT    Recommendations for Other Services       Precautions / Restrictions Precautions Precautions: Fall Required Braces or Orthoses: Splint/Cast Splint/Cast: R wrist cock-up adn R radila n palsy splint Restrictions Weight Bearing Restrictions: No     Mobility  Bed Mobility   Bed Mobility: Supine to Sit     Supine to sit: Min assist Sit to supine: Min assist   General bed mobility comments: truncal assist up to EOB and R LE assist back into bed.after pt attempted to complete each task without assist    Transfers Overall transfer level: Needs assistance Equipment used: Rolling walker (2 wheels) Transfers: Sit to/from Stand Sit to Stand: Min assist           General transfer comment: cues for hand placement,  stability assist.    Ambulation/Gait Ambulation/Gait assistance: Min assist, +2 physical assistance, +2 safety/equipment Gait Distance (Feet): 150 Feet Assistive device: 1 person hand held assist Gait Pattern/deviations: Step-through pattern   Gait velocity interpretation: <1.8 ft/sec, indicate of risk for recurrent falls   General Gait Details: mildly unsteady gait overall, decreased speed, list/drift L at times with scanning..   Stairs             Wheelchair Mobility    Modified Rankin (Stroke Patients Only) Modified Rankin (Stroke Patients Only) Pre-Morbid Rankin Score: No significant disability Modified Rankin: Moderately severe disability     Balance Overall balance assessment: Needs assistance Sitting-balance support: Single extremity supported, No upper extremity supported, Feet supported Sitting balance-Leahy Scale: Good Sitting balance - Comments: can reach outside BOS without LOB   Standing balance support: No upper extremity supported Standing balance-Leahy Scale: Fair Standing balance comment: statically fair otherwise needs minimal external support                            Cognition Arousal/Alertness: Awake/alert Behavior During Therapy: WFL for tasks assessed/performed Overall Cognitive Status: Within Functional Limits for tasks assessed                                          Exercises      General Comments General comments (skin integrity, edema, etc.): seen for splint check      Pertinent Vitals/Pain Pain Assessment  Pain Assessment: No/denies pain Pain Intervention(s): Monitored during session    Home Living                          Prior Function            PT Goals (current goals can now be found in the care plan section) Acute Rehab PT Goals PT Goal Formulation: With patient Time For Goal Achievement: 01/20/22 Potential to Achieve Goals: Good Progress towards PT goals: Progressing  toward goals    Frequency    Min 3X/week      PT Plan Current plan remains appropriate    Co-evaluation              AM-PAC PT "6 Clicks" Mobility   Outcome Measure  Help needed turning from your back to your side while in a flat bed without using bedrails?: A Little Help needed moving from lying on your back to sitting on the side of a flat bed without using bedrails?: A Little Help needed moving to and from a bed to a chair (including a wheelchair)?: A Little Help needed standing up from a chair using your arms (e.g., wheelchair or bedside chair)?: A Little Help needed to walk in hospital room?: A Little Help needed climbing 3-5 steps with a railing? : A Little 6 Click Score: 18    End of Session   Activity Tolerance: Patient tolerated treatment well Patient left: in chair;with call bell/phone within reach;with chair alarm set Nurse Communication: Mobility status PT Visit Diagnosis: Unsteadiness on feet (R26.81);Muscle weakness (generalized) (M62.81)     Time: 9892-1194 PT Time Calculation (min) (ACUTE ONLY): 21 min  Charges:  $Gait Training: 8-22 mins                     01/06/2022  Ginger Carne., PT Acute Rehabilitation Services [phone]  (pager) [phone]  (office)   Tessie Fass Makhari Dovidio 01/06/2022, 6:33 PM

## 2022-01-06 NOTE — Progress Notes (Signed)
Orthopedic Tech Progress Note Patient Details:  Albert Love [birth year] [id number]  [name] (OT) was at bedside with pt and said she would apply. Wrist brace was handed to her.  Ortho Devices Type of Ortho Device: Velcro wrist splint Ortho Device/Splint Location: RUE Ortho Device/Splint Interventions: Ordered      Carin Primrose 01/06/2022, 11:45 AM

## 2022-01-06 NOTE — Plan of Care (Signed)
  Problem: Education: Goal: Knowledge of disease or condition will improve Outcome: Progressing Goal: Knowledge of secondary prevention will improve (SELECT ALL) Outcome: Progressing Goal: Knowledge of patient specific risk factors will improve (INDIVIDUALIZE FOR PATIENT) Outcome: Progressing   Problem: Coping: Goal: Will verbalize positive feelings about self Outcome: Progressing   Problem: Health Behavior/Discharge Planning: Goal: Ability to manage health-related needs will improve Outcome: Progressing   Problem: Self-Care: Goal: Ability to participate in self-care as condition permits will improve Outcome: Progressing Goal: Verbalization of feelings and concerns over difficulty with self-care will improve Outcome: Progressing   Problem: Nutrition: Goal: Risk of aspiration will decrease Outcome: Progressing

## 2022-01-06 NOTE — Progress Notes (Signed)
Family Medicine Teaching Service Daily Progress Note Intern Pager: [phone]  Patient name: Albert Love record number: [id number] Date of birth: 05-10-1941 Age: 81 y.o. Gender: male  Primary Care Provider: Lenoria Chime, MD Consultants: Neurology Code Status: DNR  Pt Overview and Major Events to Date:  5/23 admitted-found on ground, R cerebellar stroke wit partial radial neuropathy from being down  Assessment and Plan:  Albert Love is a 81 y.o. male who presented with right upper extremity weakness and fall. 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  Bag on top of previous JP drain site, some ulceration around circular wound present. Has remained afebrile, mild tachycardia to 105, saturating well on RA. -d/c 75 ml/hr NS -Augmentin BID until 5/28 -d/c prn labetalol -tylenol prn -fentanyl q2h prn  -hospice  R cerebellar stroke  Radial neuropathy Small acute or subacute infarct in inferior right cerebellum-likely asymptomatic per neurology. 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  Unable to extend right wrist and unable to lift right leg off of bed but able to keep right leg up against gravity when lifted up manually.  Alert and responsive to all questions -Neurology following -atorvastatin 80 mg  -outpatient EMGs  Prior central retinal arterial occlusion 10/21/2020 -ASA 81 mg  GOC Patient has been in home hospice for the past month but is not total comfort care. 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  Pain Baclofen 5 mg BID  Protein calorie malnutrition -nutrition supplement -RD consult  FEN/GI: Heart healthy PPx: SCDs Dispo: Home with hospice pending off IV medicines  Subjective:  Patient feels okay this morning would like to work with PT to strengthen  Objective: Temp:  [97.9 F (36.6 C)-99.7 F (37.6 C)] 98 F (36.7 C) (05/25 0350) Pulse Rate:  [78-105] 81 (05/25 0350) Resp:  [16-18] 16 (05/25 0350) BP: (118-130)/(70-83) 121/76 (05/25 0350) SpO2:  [99 %-100 %] 100 % (05/25 0350) Weight:  [62.8 kg] 62.8 kg (05/25 0254) Physical Exam: General: NAD, laying in bed, alert and responsive to all questions Cardiovascular: RRR no murmurs rubs or gallops Respiratory: Clear to auscultation bilaterally no wheezes rales or crackles Abdomen: Nontender to palpation, soft, ostomy site in right lower quadrant with ulceration around the wound, VAC in place draining feculent appearing fluid Extremities: No lower edema, unable to lift right leg off of bed but able to hold it up against gravity.  Unable to extend right wrist but 5/5 grip strength bilaterally  Laboratory: Recent Labs  Lab 01/04/22 1141 01/05/22 0347 01/06/22 0530  WBC 16.8* 16.5* 14.4*  HGB 8.4* 8.0* 7.6*  HCT 27.8* 27.3* 24.4*  PLT 481* 505* 446*   Recent Labs  Lab 01/04/22 1141 01/05/22 0347 01/06/22 0530  NA 139 139 137  K 3.8 3.8 3.5  CL 103 105 107  CO2 '28 24 25  '$ BUN 10 10 7*  CREATININE 0.80 0.69 0.58*  CALCIUM 8.7* 8.5* 7.7*  PROT 6.0* 5.6* 4.9*  BILITOT 0.5 0.5 0.7  ALKPHOS  70 69 57  ALT '10 11 10  '$ AST '16 21 15  '$ GLUCOSE 109* 121* 116*    Imaging/Diagnostic Tests: ECHOCARDIOGRAM COMPLETE  Result Date: 01/05/2022    ECHOCARDIOGRAM REPORT   Patient Name:   Albert Love Date of Exam: 01/05/2022 Medical Rec #:  [id number]      Height:       71.0 in Accession #:    [id number]     Weight:       122.0 lb Date  of Birth:  [birth year]      BSA:          1.709 m Patient Age:    54 years       BP:           136/74 mmHg Patient Gender: M              HR:           88 bpm. Exam Location:  Inpatient Procedure: 2D Echo, Cardiac Doppler and Color Doppler Indications:    CVA  History:        Patient has prior history of Echocardiogram examinations, most                 recent 10/20/2020.  Sonographer:    Luisa Hart RDCS Referring Phys: [id number] DENISE A WOLFE  Sonographer Comments: No parasternal window, suboptimal apical window and Technically challenging study due to limited acoustic windows. Image acquisition challenging due to patient body habitus. IMPRESSIONS  1. Left ventricular ejection fraction, by estimation, is 55 to 60%. The left ventricle has normal function. The left ventricle has no regional wall motion abnormalities. Left ventricular diastolic parameters are consistent with Grade I diastolic dysfunction (impaired relaxation).  2. Right ventricular systolic function is normal. The right ventricular size is mildly enlarged. There is normal pulmonary artery systolic pressure. The estimated right ventricular systolic pressure is 03.0 mmHg.  3. The mitral valve is normal in structure. No evidence of mitral valve regurgitation. No evidence of mitral stenosis.  4. The aortic valve is tricuspid. There is moderate calcification of the aortic valve. Aortic valve regurgitation is not visualized. No aortic stenosis is present.  5. The inferior vena cava is normal in size with greater than 50% respiratory variability, suggesting right atrial pressure of 3 mmHg. FINDINGS  Left Ventricle: Left ventricular ejection fraction, by estimation, is 55 to 60%. The left ventricle has normal function. The left ventricle has no regional wall motion abnormalities. The left ventricular internal cavity size was normal in size. There is  no left ventricular hypertrophy. Left ventricular diastolic parameters are consistent with Grade I diastolic  dysfunction (impaired relaxation). Right Ventricle: The right ventricular size is mildly enlarged. No increase in right ventricular wall thickness. Right ventricular systolic function is normal. There is normal pulmonary artery systolic pressure. The tricuspid regurgitant velocity is 2.65  m/s, and with an assumed right atrial pressure of 3 mmHg, the estimated right ventricular systolic pressure is 09.2 mmHg. Left Atrium: Left atrial size was normal in size. Right Atrium: Right atrial size was normal in size. Pericardium: There is no evidence of pericardial effusion. Mitral Valve: The mitral valve is normal in structure. No evidence of mitral valve regurgitation. No evidence of mitral valve stenosis. MV peak gradient, 3.7 mmHg. The mean mitral valve gradient is 1.0 mmHg. Tricuspid Valve: The tricuspid valve is normal in structure. Tricuspid valve regurgitation is trivial. Aortic Valve: The aortic valve is tricuspid. There is moderate calcification of the aortic valve.  Aortic valve regurgitation is not visualized. No aortic stenosis is present. Aortic valve mean gradient measures 3.0 mmHg. Aortic valve peak gradient measures 5.9 mmHg. Pulmonic Valve: The pulmonic valve was not well visualized. Pulmonic valve regurgitation is not visualized. Aorta: The aortic root was not well visualized. Venous: The inferior vena cava is normal in size with greater than 50% respiratory variability, suggesting right atrial pressure of 3 mmHg. IAS/Shunts: No atrial level shunt detected by color flow Doppler.   LV Volumes (MOD) LV vol d, MOD A4C: 57.4 ml Diastology LV vol s, MOD A4C: 30.6 ml LV e' medial:    9.06 cm/s LV SV MOD A4C:     57.4 ml LV E/e' medial:  8.8                            LV e' lateral:   8.25 cm/s                            LV E/e' lateral: 9.6  RIGHT VENTRICLE RV Basal diam:  4.30 cm RV Mid diam:    2.60 cm RV S prime:     25.00 cm/s TAPSE (M-mode): 2.3 cm LEFT ATRIUM             Index        RIGHT ATRIUM            Index LA Vol (A2C):   21.6 ml 12.64 ml/m  RA Area:     14.40 cm LA Vol (A4C):   35.4 ml 20.71 ml/m  RA Volume:   35.10 ml  20.54 ml/m LA Biplane Vol: 28.2 ml 16.50 ml/m  AORTIC VALVE AV Vmax:           121.00 cm/s AV Vmean:          82.400 cm/s AV VTI:            0.208 m AV Peak Grad:      5.9 mmHg AV Mean Grad:      3.0 mmHg LVOT Vmax:         89.70 cm/s LVOT Vmean:        60.100 cm/s LVOT VTI:          0.158 m LVOT/AV VTI ratio: 0.76 MITRAL VALVE               TRICUSPID VALVE MV Area (PHT): 3.85 cm    TR Peak grad:   28.1 mmHg MV Peak grad:  3.7 mmHg    TR Vmax:        265.00 cm/s MV Mean grad:  1.0 mmHg MV Vmax:       0.96 m/s    SHUNTS MV Vmean:      53.9 cm/s   Systemic VTI: 0.16 m MV Decel Time: 197 msec MV E velocity: 79.50 cm/s MV A velocity: 87.80 cm/s MV E/A ratio:  0.91 Dalton McleanMD Electronically signed by Franki Monte Signature Date/Time: 01/05/2022/2:33:19 PM    Final      [name], MD 01/06/2022, 7:06 AM PGY-1, [place] Intern pager: [phone], text pages welcome

## 2022-01-06 NOTE — Consult Note (Addendum)
[facility] Nurse Consult Note: Reason for Consult: Pt has full thickness wound to right abd draining mod amt liquid stool. 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  Bedside nurse has applied a rectal pouch to attempt to contain stool.  I called the patient's son [name], whom he states will be the caregiver after discharge, to arrange a pouch change demonstration.  He did not answer the phone and I left a message; the patient thinks he is at work.  I will attempt to be present for a teaching session if he visits today or tomorrow, however I leave at 3:00. If we cannot meet in person, I can perform a face time session  or make a video on the patient's phone for the demonstration. 5 sets of ostomy pouches left at the bedside for staff nurses' use, also educational materials with pictures of the steps involved in a pouch change. I left my phone number with the staff nurse so she can notify me if the son calls back with a possible time to meet.  Dressing procedure/placement/frequency: Orders left for bedside nurses: Apply ostomy pouch Kellie Simmering # [id number]) to right abd wound which is drainage stool; bedside nurses change PRN if leaking.  Post-note 1:00: Checked back with patient, he has not heard from his son since the message was left this morning. Julien Girt MSN, RN, [place], [place], [place]

## 2022-01-06 NOTE — Progress Notes (Signed)
Family Medicine Teaching Service Daily Progress Note Intern Pager: [phone]  Patient name: Albert Love record number: [id number] Date of birth: 1941/01/05 Age: 81 y.o. Gender: male  Primary Care Provider: Lenoria Chime, MD Consultants: neurology Code Status: DNR  Pt Overview and Major Events to Date:  5/23 admitted-found on ground, R cerebellar stroke wit partial radial neuropathy from being down  Assessment and Plan:  Albert Love is a 81 y.o. male who presented with right upper extremity weakness and fall. 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 Ostomy bag on top of site with some purulent and fleculant drainage and ulceration near wound. WBC was 17.8 today from 14.4. LA 2.6>2.4>1.4.  -Augmentin BID until 5/28 -tylenol prn -fentanyl 50 mcg q2h prn> home percocet 10 mg TID prn -zofran PO -home hospice dispo  -CBC, BMP  Right cerebellar stroke  Radial neuropathy Continues to have issues with extension of wrist on right side and unable to lift leg off of bed on right. 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 -atorvastatin 80 mg -ASA 81 mg daily -PT  -outpatient EMGs -neuro signed off  Goals of Care Home hospice dispo. Would like to avoid future hospitalizations. Wants to continue medications he is on/antibiotics. Not full comfort care. 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 Patient asymptomatic  -monitor Hgb    Chronic/stable Prior central retinal arterial occlusion 10/21/2020-ASA 81 mg  Insomnia-melatonin 5 mg prn Hx cocaine use-last use 5/21-encourage cessation Pain-baclofen 5 mg BID, fentanyl prn Protein calorie malnutrition-RD consult, nutrition supplement GERD-famotidine 20 Tobacco use-nicotine patch  FEN/GI: Heart healthy PPx: SCDs Dispo: Home with hospice likely today  Subjective:  Patient would like to stay today and feeling pain is in his abdomen.  Feels nauseous as well.  Objective: Temp:  [97.8 F (36.6 C)-100.1 F (37.8 C)] 100.1 F (37.8 C) (05/26 0328) Pulse Rate:  [96-107] 102 (05/26 0328) Resp:  [16-20] 18 (05/26 0328) BP: (138-146)/(54-77) 138/74 (05/26 0328) SpO2:  [100 %] 100 % (05/26 0328) Weight:  [63.3 kg] 63.3 kg (05/26 0328) Physical Exam: General: NAD, laying in bed comfortably, alert and responsive to all questions Cardiovascular: RRR no murmurs rubs or gallops Respiratory: Auscultation bilaterally no wheezes rales or crackles Abdomen: Soft, tender near RLQ wound with ostomy bag in place with purulent/feculent discharge Extremities: No LE edema  Laboratory: Recent Labs  Lab 01/05/22 0347 01/06/22 0530 01/07/22 0357  WBC 16.5* 14.4* 17.8*  HGB 8.0* 7.6* 7.8*  HCT 27.3* 24.4* 25.3*  PLT 505* 446* 489*   Recent Labs  Lab 01/04/22 1141 01/05/22 0347 01/06/22 0530 01/07/22 0357  NA 139 139 137 136  K 3.8 3.8 3.5 3.7  CL 103 105 107 104  CO2 '28 24 25 25  '$ BUN 10 10 7* 6*  CREATININE 0.80 0.69 0.58* 0.58*  CALCIUM 8.7* 8.5* 7.7* 8.0*  PROT 6.0* 5.6* 4.9*  --   BILITOT 0.5 0.5 0.7  --   ALKPHOS 70 69 57  --   ALT '10 11 10  '$ --   AST '16 21 15  '$ --  GLUCOSE 109* 121* 116* 109*    Imaging/Diagnostic Tests: No results found.   [name], MD 01/07/2022, 7:02 AM PGY-1, [place] Intern pager: [phone], text pages welcome

## 2022-01-06 NOTE — Progress Notes (Addendum)
STROKE TEAM PROGRESS NOTE   INTERVAL HISTORY No family at the bedside. Marland Kitchen  He continues to have weakness in his right hand with some wrist drop.  OT will get him a splint to support his wrist, hand, and fingers. . Consider outpatient EMGs if he is able to go to outpatient appointments.  Echocardiogram shows normal ejection fraction of 55 to 60%.  Vital signs are stable.  Neurological exam unchanged Vitals:   01/05/22 2324 01/06/22 0254 01/06/22 0350 01/06/22 0801  BP: 118/80  121/76 (!) 146/77  Pulse: 78  81 96  Resp: '18  16 20  '$ Temp: 97.9 F (36.6 C)  98 F (36.7 C) 97.8 F (36.6 C)  TempSrc: Oral  Oral Oral  SpO2: 100%  100% 100%  Weight:  62.8 kg     CBC:  Recent Labs  Lab 01/05/22 0347 01/06/22 0530  WBC 16.5* 14.4*  NEUTROABS 13.9* 11.8*  HGB 8.0* 7.6*  HCT 27.3* 24.4*  MCV 67.1* 65.4*  PLT 505* 025*   Basic Metabolic Panel:  Recent Labs  Lab 01/04/22 2100 01/05/22 0347 01/06/22 0530  NA  --  139 137  K  --  3.8 3.5  CL  --  105 107  CO2  --  24 25  GLUCOSE  --  121* 116*  BUN  --  10 7*  CREATININE  --  0.69 0.58*  CALCIUM  --  8.5* 7.7*  MG 1.9 1.8  --    Lipid Panel:  Recent Labs  Lab 01/05/22 0347  CHOL 154  TRIG 94  HDL 55  CHOLHDL 2.8  VLDL 19  LDLCALC 80   HgbA1c:  Recent Labs  Lab 01/05/22 0347  HGBA1C 5.7*   Urine Drug Screen: No results for input(s): LABOPIA, COCAINSCRNUR, LABBENZ, AMPHETMU, THCU, LABBARB in the last 168 hours.  Alcohol Level No results for input(s): ETH in the last 168 hours.  IMAGING past 24 hours No results found.  PHYSICAL EXAM  Physical Exam  Constitutional: Thin pleasant elderly male  Neuro: Mental Status: Patient is awake, alert, oriented to person, place, month, year, and situation. Patient is able to give a clear and coherent history. No signs of aphasia or neglect Cranial Nerves: II: Visual Fields are full. Pupils are equal, round, and reactive to light.   III,IV, VI: EOMI without ptosis or  diploplia.  V: Facial sensation is symmetric to temperature VII: Facial movement is symmetric resting and smiling VIII: Hearing is intact to voice X: Palate elevates symmetrically XI: Shoulder shrug is symmetric. XII: Tongue protrudes midline without atrophy or fasciculations.  Motor: Tone is normal. Thin, cachexia     Right  Left Shoulder Abduction  5/5  5/5 Elbow Flexion   5/5  5/5 Elbow Extension  5/5  5/5 Wrist Flexion   4/5  5/5 Wrist Extension  3/5  5/5 Interossei   1/5  5/5 Finger Extension  3/5  5/5 Finger Flexion   3/5  5/5  Right lower Extremity 5/5 Left lower extremity 4/5 with drift but poor effort  Sensory: Sensation is symmetric to light touch and temperature in the arms and legs. No extinction to DSS present.  Tinel's sign is negative over the right radial groove or ulnar groove.  ASSESSMENT/PLAN Mr. Luby Seamans is a 81 y.o. male with history of colon cancer, recurrent LLQ abdominal wound, previous CVA, GERD, iron deficiency anemia, smoker, HLD presenting with right upper extremity weakness. He does report a fall, however he does not know how  he fell. Symptoms are consistent with a brachial plexus injury or a radial nerve palsy, however he does appear to have some ulnar nerve impairment as well. ASA '81mg'$  initiated due to stroke finding. Consider outpatient EMGs and MRI.   Stroke:  Inferior right cerebellar infarct likely clinically silent secondary to small vessel disease versus hypercoagulability from colon cancer, likely incidental as it does not correlate with the right hand/wrist weakness that he presents with.  Right hand weakness predominantly in the radial nerve distribution muscles and to a lesser extent in the ulnar muscles as well possibly from radial nerve or brachial plexus injury from the fall Code Stroke CT head small age indeterminate infarct in the right cerebellum, microvascular disease  CTA head & neck No LVO or significant stenosis  MRI  small  acute or subacute infarct in the inferior right cerebellum  2D Echo EF 19-62%, grade 1 diastolic dysfunction LDL 80 HgbA1c 5.7 VTE prophylaxis - None    Diet   Diet Heart Room service appropriate? Yes; Fluid consistency: Thin   No antithrombotic prior to admission, now on aspirin 81 mg daily.  Therapy recommendations:  pending Disposition:  pending  Radial Nerve Palsy/Brachial plexus injury OT to place a splint  Recommend outpatient EMGs  Hypertension Home meds:  None Stable Permissive hypertension (OK if < 220/120) but gradually normalize in 5-7 days Long-term BP goal normotensive  Hyperlipidemia Home meds:  Atorvastatin '80mg'$ , resumed in hospital LDL 80, goal < 70 Continue statin at discharge  Other Stroke Risk Factors Advanced Age >/= 78  Hx stroke/TIA  Other Active Problems Sepsis with recurrent wounds Colon cancer  On hospice care at home  Protein calorie malnutrition Continue nutritional supplementation  Hospital day # 1  Patient seen and examined by NP/APP with MD. MD to update note as needed.   Janine Ores, DNP, FNP-BC [facility] Pager: [phone] I have personally obtained history,examined this patient, reviewed notes, independently viewed imaging studies, participated in medical decision making and plan of care.ROS completed by me personally and pertinent positives fully documented  I have made any additions or clarifications directly to the above note. Agree with note above.  Recommend aspirin 81 mg daily for stroke prevention and aggressive risk factor modification.  Right wrist extension splint and continue outpatient occupational therapy and outpatient EMG nerve conduction study confirmed radioman versus brachial plexus injury.  Stroke team will sign off.  Kindly call for questions.  Greater than 50% time during this 35-minute visit was spent in counseling and coordination of care about his and weakness and discussion about urinalysis  brachioplexus injury as well as silent cerebellar stroke and answering questions.  Antony Contras, MD Medical Director [facility] Pager: [phone] 01/06/2022 1:45 PM  To contact Stroke Continuity provider, please refer to [email or web address] After hours, contact General Neurology

## 2022-01-06 NOTE — Progress Notes (Signed)
Occupational Therapy Treatment Patient Details Name: Albert Love MRN: [id number] DOB: 02/18/1941 Today's Date: 01/06/2022   History of present illness 81 y/o male presenting from home with complaint of R UE weakness.  MRI showed a small acute to subacute infarct in the inferior right cerebellum.  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   OT comments  Ortho tech called to bring R wrist cock-up splint to room. Pt seen for fabrication of R radial nerve palsy splint. Educated pt on use of wrist cock-up splint for night (when sleeping) and intermittently during the day when not wearing functional radial nerve palsy splint to provide support to wrist. Pt verbalized understanding. Pt is able to use R hand functionally with use of splint. Will return for splint check. Recommend pt follow up with OT with either [facility] or preferably outpt (whichever allowed by hospice) to progress rehab of R hand.    Recommendations for follow up therapy are one component of a multi-disciplinary discharge planning process, led by the attending physician.  Recommendations may be updated based on patient status, additional functional criteria and insurance authorization.    Follow Up Recommendations  Outpatient OT    Assistance Recommended at Discharge Frequent or constant Supervision/Assistance  Patient can return home with the following  A little help with walking and/or transfers;A little help with bathing/dressing/bathroom;Assistance with cooking/housework;Direct supervision/assist for medications management;Direct supervision/assist for financial management;Assist for transportation;Help with stairs or ramp for entrance   Equipment Recommendations  None recommended by OT;Other (comment)    Recommendations for Other Services       Precautions / Restrictions Precautions Precautions: Fall       Mobility Bed Mobility                    Transfers                         Balance                                           ADL either performed or assessed with clinical judgement   ADL                                              Extremity/Trunk Assessment Upper Extremity Assessment Upper Extremity Assessment: RUE deficits/detail RUE Deficits / Details: trace wrist extension noted; no active finger extension; unable to use hand functionally at this time            Vision       Perception     Praxis      Cognition                                       General Comments: note decreased STM        Exercises      Shoulder Instructions       General Comments Wrist cock-up splint ordered yesterday, Called ortho tech to bring to room. Radial nerve palsy  splint partially fabricated.    Pertinent Vitals/ Pain       Pain Assessment Pain Assessment: No/denies pain  Home Living                                          Prior Functioning/Environment              Frequency  Min 3X/week        Progress Toward Goals  OT Goals(current goals can now be found in the care plan section)  Progress towards OT goals: Progressing toward goals  Acute Rehab OT Goals Patient Stated Goal: to be able to use his R hand OT Goal Formulation: With patient Time For Goal Achievement: 01/19/22 Potential to Achieve Goals: Good ADL Goals Pt Will Perform Lower Body Dressing: with supervision;sit to/from stand;sitting/lateral leans Pt Will Transfer to Toilet: with supervision;ambulating;regular height toilet Pt Will Perform Tub/Shower Transfer: with supervision;shower seat;ambulating;Shower transfer;Tub transfer Additional ADL Goal #1: Pt will be able to demonstrate donning/doffing of splint and verbalize wear  schedule in order to support RUE during functional tasks  Plan Discharge plan remains appropriate    Co-evaluation                 AM-PAC OT "6 Clicks" Daily Activity     Outcome Measure   Help from another person eating meals?: A Little Help from another person taking care of personal grooming?: A Little Help from another person toileting, which includes using toliet, bedpan, or urinal?: A Little Help from another person bathing (including washing, rinsing, drying)?: A Lot Help from another person to put on and taking off regular upper body clothing?: A Little Help from another person to put on and taking off regular lower body clothing?: A Lot 6 Click Score: 16    End of Session    OT Visit Diagnosis: Unsteadiness on feet (R26.81);Muscle weakness (generalized) (M62.81)   Activity Tolerance Patient tolerated treatment well   Patient Left in bed;with call bell/phone within reach;with bed alarm set;with nursing/sitter in room   Nurse Communication Other (comment) (splint care)        Time: 3220-2542 OT Time Calculation (min): 107 min  Charges: OT General Charges $OT Visit: 1 Visit OT Treatments $Therapeutic Activity: 8-22 mins $Orthotics Fit/Training: 83-97 mins $ Splint materials complex: [place], OT/L   Acute OT Clinical Specialist Acute Rehabilitation Services Pager [phone] Office [phone]   [facility] 01/06/2022, 5:20 PM

## 2022-01-06 NOTE — Hospital Course (Addendum)
Albert Love is a 81 y.o. male who presented with right upper extremity weakness and fall. 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  CT head showed small age-indeterminate infarct in the right cerebellum. MRI brain showed small acute or subacute infarct in the inferior right cerebellum, with mild edema without mass effect. Neurology was consulted who recommended that pattern of weakness suggests primarily radial nerve injury but some component from ulnar nerve mediated muscles as well likely represents mild brachial plexus injury. MRI is negative for any left hemispheric infarct.  Recommended he may benefit with outpatient nerve conduction study.   Asymptomatic right cerebellar stroke  Pt was started on ASA and aggressive risk factor modification. Echo showed  EF 55-60% with grade I diastolic dysfunction and significant valvular abnormalities. CT head and neck showed no intracranial large vessel occlusion or significant stenosis and no hemodynamically significant stenosis in the neck.   Sepsis 2/2 fistula in RLQ (from previous JP drain) LA elevated to 3.3.>1.4. WBC elevated to 16.8 initially with neutrophilic predominance. Tachypneic as well. CXR and UA negative. Pt initially received Vancomycin, Rocephin and mIVF and transitioned to Augmentin PO. CEA was pending at discharge. At discharge patients vitals were stable and was discharged on augmentin course ending 5/28.   Colon cancer  Goals of Care Pt no longer pursuing chemo/radiation and is in hospice. CT abdomen  showed interval increase in size of the complex, heterogeneous mass involving the cecum and proximal to mid ascending colon. Marked severity prostatomegaly stable. Patient would like to avoid future hospitalizations and goal to live in his apartment as long as possible. Patient wanted PT for weakness/radial neuropathy.  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 At discharge it was 8.0, stable  EMG/nerve conduction studies in 3-4 weeks  If worsening symptoms neuro recommend MRI brachial plexus for possible malignant involvement  Pt would like to avoid future hospitalizations and remain in his apt as long as possible

## 2022-01-06 NOTE — Progress Notes (Signed)
Occupational Therapy Treatment Note  Pt seen for splint check. Pt states he is a "little sore" over the ulnar styloid process. Note minimal redness. Material removed from that area of splint. Will plan to adjust splint in am. Pt can use splint to help feed himself , otherwise, recommend using wrist cock-up splint until splint is adjusted in am.     01/06/22 1732  OT Visit Information  Last OT Received On 01/06/22  Assistance Needed +1  History of Present Illness 81 y/o male presenting from home with complaint of R UE weakness.  MRI showed a small acute to subacute infarct in the inferior right cerebellum.  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  Precautions  Precautions Fall  Required Braces or Orthoses Splint/Cast  Splint/Cast R wrist cock-up adn R radila n palsy splint  Pain Assessment  Pain Assessment No/denies pain  General Comments  General comments (skin integrity, edema, etc.) seen for splint check  OT - End of Session  Activity Tolerance Patient tolerated treatment well  Patient left in bed;with call bell/phone within reach;with bed alarm set;with nursing/sitter in room  Nurse Communication Other (comment) (splint care)  OT Assessment/Plan  OT Plan Discharge plan remains appropriate  OT Visit Diagnosis Unsteadiness on feet (R26.81);Muscle weakness (generalized) (M62.81)  OT Frequency (ACUTE ONLY) Min 3X/week  Follow Up Recommendations Outpatient OT  Assistance recommended at discharge Frequent or constant Supervision/Assistance  Patient can return home with the following A little help with walking and/or transfers;A little help with bathing/dressing/bathroom;Assistance with cooking/housework;Direct supervision/assist for medications management;Direct supervision/assist for financial management;Assist  for transportation;Help with stairs or ramp for entrance  OT Equipment None recommended by OT;Other (comment)  AM-PAC OT "6 Clicks" Daily Activity Outcome Measure (Version 2)  Help from another person eating meals? 3  Help from another person taking care of personal grooming? 3  Help from another person toileting, which includes using toliet, bedpan, or urinal? 3  Help from another person bathing (including washing, rinsing, drying)? 2  Help from another person to put on and taking off regular upper body clothing? 3  Help from another person to put on and taking off regular lower body clothing? 2  6 Click Score 16  Progressive Mobility  Activity Moved into chair position in bed  OT Goal Progression  Progress towards OT goals Progressing toward goals  Acute Rehab OT Goals  Patient Stated Goal to be independent  OT Goal Formulation With patient  Time For Goal Achievement 01/19/22  Potential to Achieve Goals Good  ADL Goals  Pt Will Perform Lower Body Dressing with supervision;sit to/from stand;sitting/lateral leans  Pt Will Transfer to Toilet with supervision;ambulating;regular height toilet  Pt Will Perform Tub/Shower Transfer with supervision;shower seat;ambulating;Shower transfer;Tub transfer  Additional ADL Goal #1 Pt will be able to demonstrate donning/doffing of splint and verbalize wear schedule in order to support RUE during functional tasks  OT Time Calculation  OT Start Time (ACUTE ONLY) 1446  OT Stop Time (ACUTE ONLY) 1501  OT Time Calculation (min) 15 min  OT General Charges  $OT Visit 1 Visit  OT Treatments  $Orthotics/Prosthetics Check 8-22 mins   Maurie Boettcher, OT/L   Acute OT Clinical Specialist Acute Rehabilitation Services Pager [phone] Office [phone]

## 2022-01-06 NOTE — Discharge Instructions (Addendum)
Dear Albert Love,  Thank you so much for allowing [place] to be part of your care!  You were admitted to [facility] for right sided weakness after a fall. You were found to have a small stroke and may have had an injury that affected those nerves in your arm. Please follow up with neurology outpatient. You also had an infection from your abdominal wound and were put on antibiotics to treat this.  POST-HOSPITAL & CARE INSTRUCTIONS Please let PCP/Specialists know of any changes that were made.  Please see medications section of this packet for any medication changes.   DOCTOR'S APPOINTMENT   Future Appointments  Date Time Provider [place]  01/18/2022 10:30 AM Rise Patience, DO [facility] [place]    Follow-up Information     Lilland, Alana, DO. Go on 01/18/2022.   Specialty: Family Medicine Why: At 10:30 am. Please arrive by 10:15 am. This is your hospital follow up appointment at the family medicine clinic. You will see Dr. Oleh Genin. Contact information: [place] [address] [place] [facility] Hospice Follow up.   Why: [place] [phone]              Take care and be well!  [facility]  [place], [place] [address] [phone]

## 2022-01-06 NOTE — Progress Notes (Signed)
Occupational Therapy Treatment Note    01/06/22 1725  OT Visit Information  Last OT Received On 01/06/22  Assistance Needed +1  PT/OT/SLP Co-Evaluation/Treatment Yes  History of Present Illness 81 y/o male presenting from home with complaint of R UE weakness.  MRI showed a small acute to subacute infarct in the inferior right cerebellum.  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  Precautions  Precautions Fall  Restrictions  Weight Bearing Restrictions No  Pain Assessment  Faces Pain Scale 0  Cognition  Arousal/Alertness Awake/alert  Behavior During Therapy WFL for tasks assessed/performed  Overall Cognitive Status Within Functional Limits for tasks assessed  General Comments note decreased STM  Upper Extremity Assessment  RUE Deficits / Details improved wrist extension noted after wearing splint  RUE Coordination decreased fine motor;decreased gross motor  Vision- Assessment  Vision Assessment? No apparent visual deficits  ADL  General ADL Comments Pt with ostomy bag in place. Educated that pt willmost likely need his son to assist with care. Pt thinnks he will be ableto manage now that he can use his R hand. Discussed need for more assistance for ADL adn mobility after DC home. Pt states he is trying to DC home to live with his daughter for awhile.  Exercises  Exercises Hand exercises  Hand Exercises  Wrist Extension AAROM;PROM;Right;10 reps  Digit Composite Flexion AROM;Right;5 reps  Composite Extension PROM;Right;10 reps;Other (comment) (given ball to work on extended over ball; rolling ball wtih fingers extended when using radila n palsy splint)  Forearm Supination AROM;Right;5 reps  Forearm Pronation AROM;Right;5 reps  Digit Lifts PROM;Right;AAROM;10 reps (AA with use of splint)  OT - End of  Session  Activity Tolerance Patient tolerated treatment well  Nurse Communication Other (comment) (splint care)  OT Assessment/Plan  OT Plan Discharge plan remains appropriate  OT Visit Diagnosis Unsteadiness on feet (R26.81);Muscle weakness (generalized) (M62.81)  OT Frequency (ACUTE ONLY) Min 3X/week  Follow Up Recommendations Outpatient OT (or [place]; whichever can be set up wtih [facility])  Assistance recommended at discharge Frequent or constant Supervision/Assistance  Patient can return home with the following A little help with walking and/or transfers;A little help with bathing/dressing/bathroom;Assistance with cooking/housework;Direct supervision/assist for medications management;Direct supervision/assist for financial management;Assist for transportation;Help with stairs or ramp for entrance  OT Equipment None recommended by OT;Other (comment)  AM-PAC OT "6 Clicks" Daily Activity Outcome Measure (Version 2)  Help from another person eating meals? 3  Help from another person taking care of personal grooming? 3  Help from another person toileting, which includes using toliet, bedpan, or urinal? 3  Help from another person bathing (including washing, rinsing, drying)? 2  Help from another person to put on and taking off regular upper body clothing? 3  Help from another person to put on and taking off regular lower body clothing? 2  6 Click Score 16  Progressive Mobility  What is the highest level of mobility based on the progressive mobility assessment?  (splint)  Activity  (seen for splinting)  OT Goal Progression  Progress towards OT goals Progressing toward goals  Acute Rehab OT Goals  Patient Stated Goal to be independent as long as he can  OT Goal Formulation With patient  Time For Goal Achievement 01/19/22  Potential to Achieve Goals Good  ADL Goals  Pt Will Perform Lower  Body Dressing with supervision;sit to/from stand;sitting/lateral leans  Pt Will Transfer to Toilet with  supervision;ambulating;regular height toilet  Pt Will Perform Tub/Shower Transfer with supervision;shower seat;ambulating;Shower transfer;Tub transfer  Additional ADL Goal #1 Pt will be able to demonstrate donning/doffing of splint and verbalize wear schedule in order to support RUE during functional tasks  OT Time Calculation  OT Start Time (ACUTE ONLY) 1245  OT Stop Time (ACUTE ONLY) 1310  OT Time Calculation (min) 25 min  OT General Charges  $OT Visit 1 Visit  OT Treatments  $Self Care/Home Management  8-22 mins  $Therapeutic Exercise 8-22 mins   Maurie Boettcher, OT/L   Acute OT Clinical Specialist [place] Pager [phone] Office [phone]

## 2022-01-07 DIAGNOSIS — K632 Fistula of intestine: Secondary | ICD-10-CM | POA: Diagnosis not present

## 2022-01-07 DIAGNOSIS — I639 Cerebral infarction, unspecified: Secondary | ICD-10-CM | POA: Diagnosis not present

## 2022-01-07 LAB — BASIC METABOLIC PANEL
Anion gap: 7 (ref 5–15)
BUN: 6 mg/dL — ABNORMAL LOW (ref 8–23)
CO2: 25 mmol/L (ref 22–32)
Calcium: 8 mg/dL — ABNORMAL LOW (ref 8.9–10.3)
Chloride: 104 mmol/L (ref 98–111)
Creatinine, Ser: 0.58 mg/dL — ABNORMAL LOW (ref 0.61–1.24)
GFR, Estimated: >60 mL/min (ref >60)
Glucose, Bld: 109 mg/dL — ABNORMAL HIGH (ref 70–99)
Potassium: 3.7 mmol/L (ref 3.5–5.1)
Sodium: 136 mmol/L (ref 135–145)

## 2022-01-07 LAB — CBC
HCT: 25.3 % — ABNORMAL LOW (ref 39.0–52.0)
Hemoglobin: 7.8 g/dL — ABNORMAL LOW (ref 13.0–17.0)
MCH: 20.1 pg — ABNORMAL LOW (ref 26.0–34.0)
MCHC: 30.8 g/dL (ref 30.0–36.0)
MCV: 65 fL — ABNORMAL LOW (ref 80.0–100.0)
Platelets: 489 10*3/uL — ABNORMAL HIGH (ref 150–400)
RBC: 3.89 MIL/uL — ABNORMAL LOW (ref 4.22–5.81)
RDW: 18.7 % — ABNORMAL HIGH (ref 11.5–15.5)
WBC: 17.8 10*3/uL — ABNORMAL HIGH (ref 4.0–10.5)
nRBC: 0 % (ref 0.0–0.2)

## 2022-01-07 LAB — LACTIC ACID, PLASMA: Lactic Acid, Venous: 1.4 mmol/L (ref 0.5–1.9)

## 2022-01-07 LAB — CEA: CEA: 25.1 ng/mL — ABNORMAL HIGH (ref 0.0–4.7)

## 2022-01-07 MED ORDER — OXYCODONE-ACETAMINOPHEN 5-325 MG PO TABS
2.0000 | ORAL_TABLET | Freq: Three times a day (TID) | ORAL | Status: DC | PRN
Start: 2022-01-07 — End: 2022-01-09
  Administered 2022-01-07: 2 via ORAL
  Filled 2022-01-07: qty 2

## 2022-01-07 MED ORDER — MORPHINE SULFATE (CONCENTRATE) 10 MG/0.5ML PO SOLN
5.0000 mg | ORAL | Status: DC | PRN
  Administered 2022-01-07 – 2022-01-15 (×8): 5 mg via ORAL
  Filled 2022-01-07 (×8): qty 0.5

## 2022-01-07 MED ORDER — ENSURE ENLIVE PO LIQD
237.0000 mL | Freq: Three times a day (TID) | ORAL | Status: DC
  Administered 2022-01-07 – 2022-01-16 (×13): 237 mL via ORAL

## 2022-01-07 MED ORDER — ONDANSETRON HCL 4 MG PO TABS: 4.0000 mg | ORAL_TABLET | Freq: Four times a day (QID) | ORAL | Status: DC | PRN

## 2022-01-07 NOTE — TOC Progression Note (Signed)
Transition of Care [facility]) - Progression Note    Patient Details  Name: Albert Love MRN: [id number] Date of Birth: Jun 07, 1941  Transition of Care [facility]) CM/SW [place], [place] Phone Number: 01/07/2022, 3:44 PM  Clinical Narrative:   CSW met with patient and children at bedside to discuss decision for what services they would like at discharge. Patient and children again asked for options of hospice vs home health, and son was asking for more specifics in terms of what each service would assist with. CSW encouraged son to reach out to hospice RN to discuss more specific details, and son in agreement. [facility] also [facility] to [facility] to get assistance with aide support at home. Son asked for time to discuss with [facility] RN and get back with CSW. CSW met with patient later in the day to ask about an update, and son had not yet been able to speak with hospice RN. Patient asked CSW to check back in with son for final decision as he was putting in the work to figure out details. CSW to follow.    Expected Discharge Plan: Home w Hospice Care Barriers to Discharge: Continued Medical Work up  Expected Discharge Plan and Services Expected Discharge Plan: [place] Acute Care Choice: NA Living arrangements for the past 2 months: Single Family Home                                       Social Determinants of Health (SDOH) Interventions    Readmission Risk Interventions     View : No data to display.

## 2022-01-07 NOTE — Progress Notes (Signed)
FPTS Brief Progress Note  S: Patient reports sensation of oncoming abdominal discomfort.  As well he reports some nausea that has been relieved by Zofran.  His appetite has improved.   O: BP (!) 143/54   Pulse (!) 107   Temp 99.1 F (37.3 C) (Oral)   Resp 16   Wt 62.8 kg   SpO2 100%   BMI 19.31 kg/m     A/P: Nausea -Added Zofran 4 mg IV every 6 hours as needed  -Remainder of management per day team  - Orders reviewed. Labs for AM ordered, which were adjusted as needed.    Rosezetta Schlatter, MD 01/07/2022, 1:50 AM PGY-1, [facility] Night Resident  Please page [phone] with questions.

## 2022-01-07 NOTE — TOC Initial Note (Signed)
LATE NOTE SUBMISSION   Transition of Care [facility]) - Initial/Assessment Note    Patient Details  Name: Albert Love MRN: [id number] Date of Birth: [birth year]  Transition of Care [facility]) CM/SW Contact:    Geralynn Ochs, LCSW Phone Number: 01/07/2022, 3:42 PM  Clinical Narrative:         CSW met with patient to discuss return home, and preference for continuing with hospice care or moving to home health. CSW explained to patient differences in services, and how he can only have one service at a time. Patient asked for time to discuss with his family, and for CSW to check back with him. CSW to follow.        Expected Discharge Plan: Home w Hospice Care Barriers to Discharge: Continued Medical Work up   Patient Goals and CMS Choice Patient states their goals for this hospitalization and ongoing recovery are:: to get back home and stay home CMS Medicare.gov Compare Post Acute Care list provided to:: Patient Choice offered to / list presented to : Patient  Expected Discharge Plan and Services Expected Discharge Plan: [place] Acute Care Choice: NA Living arrangements for the past 2 months: Single Family Home                                      Prior Living Arrangements/Services Living arrangements for the past 2 months: Single Family Home Lives with:: Self Patient language and need for interpreter reviewed:: No Do you feel safe going back to the place where you live?: Yes      Need for Family Participation in Patient Care: No (Comment) Care giver support system in place?: Yes (comment) Current home services: Hospice Criminal Activity/Legal Involvement Pertinent to Current Situation/Hospitalization: No - Comment as needed  Activities of Daily Living      Permission Sought/Granted                  Emotional Assessment Appearance:: Appears stated age Attitude/Demeanor/Rapport: Engaged Affect (typically observed):  Appropriate Orientation: : Oriented to Self, Oriented to Place, Oriented to  Time, Oriented to Situation Alcohol / Substance Use: Not Applicable Psych Involvement: No (comment)  Admission diagnosis:  Right arm weakness [R29.898] Acute ischemic stroke ([place]) [I63.9] Fall, initial encounter [W19.XXXA] Cerebrovascular accident (CVA), unspecified mechanism ([place]) [I63.9] Malignant neoplasm of colon, unspecified part of colon ([place]) [C18.9] Stroke (cerebrum) [facility]) [I63.9] Patient Active Problem List   Diagnosis Date Noted   Abdominal wall fistula    Stroke (cerebrum) ([place]) 01/05/2022   Acute ischemic stroke ([place]) 01/04/2022   Colon cancer ([place]) 01/04/2022   HLD (hyperlipidemia) 01/04/2022   Tobacco abuse 01/04/2022   GERD (gastroesophageal reflux disease) 01/04/2022   Intra-abdominal abscess ([place]) 06/19/2021   Sepsis due to cellulitis ([place]) 06/19/2021   Hospice care patient 06/15/2021   Palliative care patient 06/15/2021   Protein-calorie malnutrition, severe 06/07/2021   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   PCP:  Lenoria Chime, MD Pharmacy:   [place], [place] [address] Phone:  [phone] Fax: [phone]     Social Determinants of Health (SDOH) Interventions    Readmission Risk Interventions     View : No data to display.

## 2022-01-07 NOTE — Consult Note (Addendum)
[facility] Nurse wound follow up Son at bedside for pouching educational session;  he will be the primary caregiver after discharge. Demonstrated ostomy pouch application to right abd full thickness wound; .2X.2cm with large amt tan drainage from previous drain site.  50cc in previous pouch when removed.  Intact skin surrounding.  Son was able to apply over the opening to attempt to contain drainage and was able to open and close the velcro to empty.  5 sets of ostomy pouches and educational materials left at the bedside.  If patient is followed by hospice of home health after discharge, they should assist with obtaining more pouches at that time.  Son and patient deny further questions.  Dressing procedure/placement/frequency: Orders left for bedside nurses: Apply ostomy pouch Kellie Simmering # [id number]) to right abd wound which is drainage stool; bedside nurses change PRN if leaking. Please re-consult if further assistance is needed.  Thank-you,  Julien Girt MSN, [place], [place], [place], [place]

## 2022-01-07 NOTE — Progress Notes (Signed)
PT Cancellation Note  Patient Details Name: Garrette Caine MRN: [id number] DOB: 1941-05-16   Cancelled Treatment:    Reason Eval/Treat Not Completed: Patient declined, no reason specified.  Pt reports just too tired this afternoon.  Will try back tomorrow as able. 01/07/2022  Ginger Carne., PT Acute Rehabilitation Services [phone]  (pager) [phone]  (office)   Tessie Fass Wille Aubuchon 01/07/2022, 6:03 PM

## 2022-01-07 NOTE — Progress Notes (Signed)
Initial Nutrition Assessment  DOCUMENTATION CODES:  Severe malnutrition in context of chronic illness  INTERVENTION:  Liberalize diet to regular Ensure Enlive po TID, each supplement provides 350 kcal and 20 grams of protein. Magic cup TID with meals, each supplement provides 290 kcal and 9 grams of protein  NUTRITION DIAGNOSIS:  Severe Malnutrition (in the context of chronic illness (cancer)) related to  (inadequate energy .intake) as evidenced by severe fat depletion, severe muscle depletion.  GOAL:  Patient will meet greater than or equal to 90% of their needs  MONITOR:  PO intake, Supplement acceptance  REASON FOR ASSESSMENT:  Consult Assessment of nutrition requirement/status  ASSESSMENT:  Pt with hx previous CVA, HLD, GERD, and of colon cancer under hospice care at home presented to ED after an increase in right sided weakness. 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  Of note, pt has a recurrent left lower quadrant abdominal wound with a JP drain in place. Discharge has become purulent and feculent at times. 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    Pt resting in bed at the time of assessment, family at bedside. Pt reports that at home, appetite is good. He does not always eat as much as he should, but his appetite is intact. Does report that since yesterday, however, he is not feeling like eating. Has not had breakfast this AM and is not feeling hungry. At home, normally eating 3x/d and drinks ensure, but inconsistently. Agreeable to receiving during admission.  Pt is unsure of his weight but does endorse likely weight loss recently. Pt has severe muscle and fat deficits present on exam suggesting long-term malnourishment. Will liberalize diet to regular. Encouraged pt to order all three meals and consume ensure between meals. Pt agreeable.  Average Meal Intake: 5/24: 75% intake x 1 recorded meals  Nutritionally Relevant Medications: Scheduled Meds:   amoxicillin-clavulanate  1 tablet Oral Q12H   atorvastatin  80 mg Oral Daily   baclofen  5 mg Oral BID   famotidine  20 mg Oral Daily   PRN Meds: ondansetron  Labs Reviewed: BUN 6, creatinine .61 CBG ranges from 109-116 mg/dL over the last 24 hours HgbA1c 5.7%  NUTRITION - FOCUSED PHYSICAL EXAM: Flowsheet Row Most Recent Value  Orbital Region Severe depletion  Upper Arm Region Severe depletion  Thoracic and Lumbar Region Severe depletion  Buccal Region Severe depletion  Temple Region Severe depletion  Clavicle Bone Region Severe depletion  Clavicle and Acromion Bone Region Severe depletion  Scapular Bone Region Severe depletion  Dorsal Hand Severe depletion  Patellar Region Severe depletion  Anterior Thigh Region Severe depletion  Posterior Calf Region Severe depletion  Edema (RD Assessment) None  Hair Reviewed  Eyes Reviewed  Mouth Reviewed  Skin Reviewed  Nails Reviewed   Diet Order:   Diet Order             Diet regular Room service appropriate? Yes; Fluid consistency: Thin  Diet effective now                   EDUCATION NEEDS:  No education needs have been identified at this time  Skin:  Skin Assessment: Reviewed RN Assessment (Per WOC: full thickness wound to right abd draining mod amt liquid stool. 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  Estimated Nutritional Needs:  Kcal:  2000-2200 kcal/d Protein:  100-115 g/d Fluid:  >/=2 L/d   Ranell Patrick, RD, LDN Clinical Dietitian RD pager # available in [place]  After hours/weekend pager # available in [facility]

## 2022-01-07 NOTE — Care Management Important Message (Signed)
Important Message  Patient Details  Name: Albert Love MRN: [id number] Date of Birth: 1940-09-04   Medicare Important Message Given:  Yes     [name] 01/07/2022, 3:14 PM

## 2022-01-07 NOTE — Progress Notes (Signed)
Occupational Therapy Treatment Patient Details Name: Albert Love MRN: [id number] DOB: June 18, 1941 Today's Date: 01/07/2022   History of present illness 81 y/o male presenting from home with complaint of R UE weakness.  MRI showed a small acute to subacute infarct in the inferior right cerebellum.  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   OT comments  Radial nerve palsy splint modified over ulnar styloid process. Pt reports splint feels much better after modification. Improved active wrist extension noted. Pt educated to wear radial nerve palsy splint for 1-2 hour intervals during the day or when needing to use R hand for functional tasks. Recommend leaving splint off for 1-2 hours to work on active wrist extension, then pt can use wrist cock-up splint for comfort, or radial nerve palsy splint for function. Pt will need to sleep in wrist cock-up splint.  Continue to recommend post acute OT follow up.    Recommendations for follow up therapy are one component of a multi-disciplinary discharge planning process, led by the attending physician.  Recommendations may be updated based on patient status, additional functional criteria and insurance authorization.    Follow Up Recommendations  Outpatient OT    Assistance Recommended at Discharge Frequent or constant Supervision/Assistance  Patient can return home with the following  A little help with walking and/or transfers;A little help with bathing/dressing/bathroom;Assistance with cooking/housework;Direct supervision/assist for medications management;Direct supervision/assist for financial management;Assist for transportation;Help with stairs or ramp for entrance   Equipment Recommendations  None recommended by OT;Other (comment)    Recommendations for Other Services       Precautions / Restrictions Precautions Precautions: Fall Required Braces or Orthoses: Splint/Cast Splint/Cast: R wrist cock-up adn R radial n palsy splint       Mobility Bed Mobility                    Transfers                         Balance                                           ADL either performed or assessed with clinical judgement   ADL                                              Extremity/Trunk Assessment Upper Extremity Assessment Upper Extremity Assessment: RUE deficits/detail RUE Deficits / Details: demonstrating increased active wrist extension; no MP extension noted            Vision       Perception     Praxis      Cognition Arousal/Alertness: Awake/alert Behavior During Therapy: WFL for tasks assessed/performed Overall Cognitive Status: No family/caregiver present to determine baseline cognitive functioning                                 General Comments: note decreased STM        Exercises Hand Exercises Wrist Extension: AROM, AAROM, Right,  Seated, 10 reps Composite Extension: AAROM, PROM, Right, 10 reps    Shoulder Instructions       General Comments Splint modified /pushed out @ ulnar styloid process to relieve any pressure. Pt reports no pian or dicomfort. Does not appear to have any contact with splint at styloid process.    Pertinent Vitals/ Pain       Pain Assessment Pain Assessment: Faces Faces Pain Scale: Hurts even more Pain Location: complains of abdominal pain Pain Descriptors / Indicators: Cramping, Sharp Pain Intervention(s): Limited activity within patient's tolerance  Home Living                                          Prior Functioning/Environment              Frequency  Min 3X/week        Progress Toward Goals  OT Goals(current goals can now be found in the care plan section)  Progress towards OT  goals: Progressing toward goals  Acute Rehab OT Goals Patient Stated Goal: to be independent OT Goal Formulation: With patient Time For Goal Achievement: 01/19/22 Potential to Achieve Goals: Good ADL Goals Pt Will Perform Lower Body Dressing: with supervision;sit to/from stand;sitting/lateral leans Pt Will Transfer to Toilet: with supervision;ambulating;regular height toilet Pt Will Perform Tub/Shower Transfer: with supervision;shower seat;ambulating;Shower transfer;Tub transfer Additional ADL Goal #1: Pt will be able to demonstrate donning/doffing of splint and verbalize wear schedule in order to support RUE during functional tasks  Plan Discharge plan remains appropriate    Co-evaluation                 AM-PAC OT "6 Clicks" Daily Activity     Outcome Measure   Help from another person eating meals?: A Little Help from another person taking care of personal grooming?: A Little Help from another person toileting, which includes using toliet, bedpan, or urinal?: A Little Help from another person bathing (including washing, rinsing, drying)?: A Lot Help from another person to put on and taking off regular upper body clothing?: A Little Help from another person to put on and taking off regular lower body clothing?: A Lot 6 Click Score: 16    End of Session    OT Visit Diagnosis: Unsteadiness on feet (R26.81);Muscle weakness (generalized) (M62.81)   Activity Tolerance Patient tolerated treatment well   Patient Left in bed;with call bell/phone within reach;with bed alarm set;with nursing/sitter in room   Nurse Communication Other (comment) (splint use)        Time: 9150-5697 OT Time Calculation (min): 26 min  Charges: OT General Charges $OT Visit: 1 Visit OT Treatments $Therapeutic Exercise: 8-22 mins $Orthotics/Prosthetics Check: 8-22 mins  Maurie Boettcher, OT/L   Acute OT Clinical Specialist Acute Rehabilitation Services Pager [phone] Office [phone]    [facility] 01/07/2022, 10:48 AM

## 2022-01-08 DIAGNOSIS — K632 Fistula of intestine: Secondary | ICD-10-CM | POA: Diagnosis not present

## 2022-01-08 LAB — CBC
HCT: 27.1 % — ABNORMAL LOW (ref 39.0–52.0)
Hemoglobin: 8.1 g/dL — ABNORMAL LOW (ref 13.0–17.0)
MCH: 19.9 pg — ABNORMAL LOW (ref 26.0–34.0)
MCHC: 29.9 g/dL — ABNORMAL LOW (ref 30.0–36.0)
MCV: 66.4 fL — ABNORMAL LOW (ref 80.0–100.0)
Platelets: 453 10*3/uL — ABNORMAL HIGH (ref 150–400)
RBC: 4.08 MIL/uL — ABNORMAL LOW (ref 4.22–5.81)
RDW: 18.6 % — ABNORMAL HIGH (ref 11.5–15.5)
WBC: 19.8 10*3/uL — ABNORMAL HIGH (ref 4.0–10.5)
nRBC: 0 % (ref 0.0–0.2)

## 2022-01-08 LAB — BASIC METABOLIC PANEL
Anion gap: 8 (ref 5–15)
BUN: 9 mg/dL (ref 8–23)
CO2: 23 mmol/L (ref 22–32)
Calcium: 8.2 mg/dL — ABNORMAL LOW (ref 8.9–10.3)
Chloride: 102 mmol/L (ref 98–111)
Creatinine, Ser: 0.67 mg/dL (ref 0.61–1.24)
GFR, Estimated: >60 mL/min (ref >60)
Glucose, Bld: 93 mg/dL (ref 70–99)
Potassium: 4.5 mmol/L (ref 3.5–5.1)
Sodium: 133 mmol/L — ABNORMAL LOW (ref 135–145)

## 2022-01-08 NOTE — Progress Notes (Signed)
Occupational Therapy Treatment Patient Details Name: Albert Love MRN: [id number] DOB: [birth year] Today's Date: 01/08/2022   History of present illness 81 y/o male presenting from home with complaint of R UE weakness.  MRI showed a small acute to subacute infarct in the inferior right cerebellum.  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   OT comments  This 81 yo male gaining movement and function back in his RUE (with and without radial nerve palsy splint on). He is still wanting to compensate with wrist flexion for some movements. Strength is grossly 3/5 in hand and wrist. He will continue to benefit from acute OT with follow up at OP OT.   Recommendations for follow up therapy are one component of a multi-disciplinary discharge planning process, led by the attending physician.  Recommendations may be updated based on patient status, additional functional criteria and insurance authorization.    Follow Up Recommendations  Outpatient OT    Assistance Recommended at Discharge Intermittent Supervision/Assistance  Patient can return home with the following  A little help with walking and/or transfers;A little help with bathing/dressing/bathroom;Assist for transportation;Assistance with cooking/housework;Help with stairs or ramp for entrance   Equipment Recommendations  None recommended by OT       Precautions / Restrictions Precautions Precautions: Fall Required Braces or Orthoses: Splint/Cast Splint/Cast: R wrist cock-up and R radial nerve palsy splint Restrictions Weight Bearing Restrictions: No       Mobility Bed Mobility Overal bed mobility: Modified Independent Bed Mobility: Sit to Supine           General bed mobility comments: He has to A his RLE into bed with the help of his LUE     Transfers Overall transfer level: Needs assistance Equipment used: Rolling walker (2 wheels) Transfers: Sit to/from Stand Sit to Stand: Supervision           General transfer comment: safe hand placement this session, took his time, stood up and got his balance before proceeding with transfer     Balance Overall balance assessment: Mild deficits observed, not formally tested (in standing)                                         ADL either performed or assessed with clinical judgement   ADL Overall ADL's : Needs assistance/impaired Eating/Feeding: Modified independent;Sitting                       Toilet Transfer: Supervision/safety;Rolling walker (2 wheels);Stand-pivot [employer] forces technical officer Details (indicate cue type and reason): simulated recliner>bed and then sit to stand from bed (pt prompted this because he wants to make sure he can stand up/get up from surfaces and be able to ambulate to the bathroom by himself (since he will be alone at home most of the time)                Extremity/Trunk Assessment Upper Extremity Assessment Upper Extremity Assessment: RUE deficits/detail RUE Deficits / Details: With all splints off he has full wrist extension against gravity, can do composite flexion with wrist at neutral but not composite flexion with wrist neutral (flexes wrist), can oppose thumb to all digits, can adduct/abduct all digits, can pick up small objects (  ie:key) but cannot hold and pick up another one at same time, he can open screw on tops with right hand while holding in left hand, he cannot open pull off tops with left hand, he cannot open flip top cap with left hand while also holding container in left hand--but he can close flip top container left hand while holding it in left hand (ie: lotion bottle) RUE Coordination: decreased gross motor;decreased fine motor            Vision Baseline Vision/History: 1 Wears glasses Patient Visual  Report: No change from baseline            Cognition Arousal/Alertness: Awake/alert   Overall Cognitive Status: Within Functional Limits for tasks assessed                                                     Pertinent Vitals/ Pain       Pain Assessment Pain Assessment: No/denies pain         Frequency  Min 3X/week        Progress Toward Goals  OT Goals(current goals can now be found in the care plan section)  Progress towards OT goals: Progressing toward goals  Acute Rehab OT Goals Patient Stated Goal: to get full use of his RUE back OT Goal Formulation: With patient Time For Goal Achievement: 01/19/22 Potential to Achieve Goals: Good  Plan Discharge plan remains appropriate       AM-PAC OT "6 Clicks" Daily Activity     Outcome Measure   Help from another person eating meals?: None Help from another person taking care of personal grooming?: A Little Help from another person toileting, which includes using toliet, bedpan, or urinal?: A Little Help from another person bathing (including washing, rinsing, drying)?: A Little Help from another person to put on and taking off regular upper body clothing?: A Little Help from another person to put on and taking off regular lower body clothing?: A Little 6 Click Score: 15    End of Session    OT Visit Diagnosis: Unsteadiness on feet (R26.81);Muscle weakness (generalized) (M62.81)   Activity Tolerance Patient tolerated treatment well   Patient Left in bed;with call bell/phone within reach;with bed alarm set           Time: 4536-4680 OT Time Calculation (min): 26 min  Charges: OT General Charges $OT Visit: 1 Visit OT Treatments $Therapeutic Activity: 23-37 mins  [place], OTR/L Acute [employer] Pager [phone] Office [phone]    Almon Register 01/08/2022, 4:28 PM

## 2022-01-08 NOTE — Progress Notes (Signed)
FPTS Brief Progress Note  S: Patient reported an increase in abdominal pain, no longer with relief from Percocet given earlier.  He describes the pain as ongoing, never fully dissipating that ranges in severity from 4-9 out of 10.  He otherwise is voices no acute concerns or complaints.   O: BP 130/75 (BP Location: Left Arm)   Pulse 84   Temp 98.7 F (37.1 C) (Oral)   Resp 18   Ht '5\' 11"'$  (1.803 m)   Wt 63.3 kg   SpO2 99%   BMI 19.46 kg/m     A/P: Pain control - Resume home morphine solution 5 mg every 4 hours as needed. - Continue home Percocet 5-325 mg 2 tablets every 8 hours as needed.   - Orders reviewed. Labs for AM ordered, which was adjusted as needed.   Rosezetta Schlatter, MD 01/08/2022, 1:26 AM PGY-1, [facility] Family Medicine Night Resident  Please page [phone] with questions.

## 2022-01-08 NOTE — Progress Notes (Signed)
Occupational Therapy Treatment Patient Details Name: Albert Love MRN: [id number] DOB: September 06, 1940 Today's Date: 01/08/2022   History of present illness 81 y/o male presenting from home with complaint of R UE weakness.  MRI showed a small acute to subacute infarct in the inferior right cerebellum.  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   OT comments  This patient seen for a second time to give him FM activities to do as homework until he starts OPOT. He was able to return demonstrate. We will continue to follow   Recommendations for follow up therapy are one component of a multi-disciplinary discharge planning process, led by the attending physician.  Recommendations may be updated based on patient status, additional functional criteria and insurance authorization.    Follow Up Recommendations  Outpatient OT    Assistance Recommended at Discharge Intermittent Supervision/Assistance  Patient can return home with the following  A little help with walking and/or transfers;A little help with bathing/dressing/bathroom;Assist for transportation;Assistance with cooking/housework;Help with stairs or ramp for entrance   Equipment Recommendations  None recommended by OT       Precautions / Restrictions Precautions Precautions: Fall Required Braces or Orthoses: Splint/Cast Splint/Cast: R wrist cock-up and R radial nerve palsy splint Restrictions Weight Bearing Restrictions: No                  Vision Baseline Vision/History: 1 Wears glasses Patient Visual Report: No change from baseline Vision Assessment?: No apparent visual deficits          Cognition Arousal/Alertness: Awake/alert Behavior During Therapy: WFL for tasks assessed/performed Overall Cognitive Status: Within Functional Limits for tasks  assessed                                          Exercises Other Exercises Other Exercises: Pt seen to give and go over RUE therapeutic activity sheet. In addition to this sheet he was given handouts (maze, word searches, and dot-to-dots), large marker, small marker, deck of cards, and clothes pins. I went over the activties on the handout he needs to try and work on (with doing them 3 times a day and choosing 5 to do each time). He returned demonstration and voiced understanding.            Pertinent Vitals/ Pain       Pain Assessment Pain Assessment: No/denies pain         Frequency  Min 3X/week        Progress Toward Goals  OT Goals(current goals can now be found in the care plan section)  Progress towards OT goals: Progressing toward goals  Acute Rehab OT Goals Patient Stated Goal: to get full use of his RUE back OT Goal Formulation: With patient Time For Goal Achievement: 01/19/22 Potential to Achieve Goals: Good  Plan Discharge plan remains appropriate       AM-PAC OT "6 Clicks" Daily Activity     Outcome Measure   Help from another person eating meals?: None Help from another person taking care of personal grooming?: A Little Help from another person toileting, which includes using toliet, bedpan, or urinal?: A Little Help from another person bathing (including washing, rinsing, drying)?: A Little Help from another person  to put on and taking off regular upper body clothing?: A Little Help from another person to put on and taking off regular lower body clothing?: A Little 6 Click Score: 15    End of Session    OT Visit Diagnosis: Unsteadiness on feet (R26.81);Muscle weakness (generalized) (M62.81)   Activity Tolerance Patient tolerated treatment well   Patient Left in bed;with call bell/phone within reach;with bed alarm set           Time: 5436-0677 OT Time Calculation (min): 14 min  Charges: OT General Charges $OT Visit: 1  Visit OT Treatments $Therapeutic Activity: 8-22 mins  Albert Love, Albert Love Acute [employer] Pager [phone] Office [phone]    Almon Register 01/08/2022, 4:39 PM

## 2022-01-08 NOTE — Progress Notes (Signed)
Family Medicine Teaching Service Daily Progress Note Intern Pager: [phone]  Patient name: Albert Love record number: [id number] Date of birth: [birth year] Age: 81 y.o. Gender: male  Primary Care Provider: Lenoria Chime, MD Consultants: Neurology s/o Code Status: DNR  Pt Overview and Major Events to Date:  5/23 admitted-found on ground, R cerebellar stroke wit partial radial neuropathy from being down  Assessment and Plan:  Albert Love is a 81 y.o. male who presented with right upper extremity weakness and fall. 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  Have some pain last night and morphine added from home meds back. -Augmentin twice daily until tomorrow -Morphine 5 mg every 4 hours as needed -Percocet 10 mg every 8 hours as needed -Baclofen 5 mg twice daily -Zofran 4 mg every 6 hours as needed  Right cerebellar stroke (asymptomatic)  Radial neuropathy Neurology signed off.  Raises right leg off bed well now, has wrist cast in place on right side. -Atorvastatin -Aspirin  Goals of Care Full comfort care, would continue looking medications but does not want to repeat hospitalizations. 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  He lives by himself and his daughter is working on getting a place set up in her home for him to stay.  Feels like his pain is under control.  He still feels very weak.  Objective: Temp:  [97.7 F (36.5 C)-99.3 F (37.4 C)] 97.7 F (36.5 C) (05/27 0417) Pulse Rate:  [84-99] 86 (05/27 0759) Resp:  [17-20] 17 (05/27 0417) BP: (122-138)/(69-96) 122/69 (05/27 0759) SpO2:  [99 %-100 %] 100 % (05/27 0759) Weight:  [63.1 kg] 63.1 kg (05/27 0500) Physical Exam: General: NAD, laying in bed comfortably Cardiovascular: RRR no murmurs or gallops Respiratory: Auscultation bilaterally Abdomen: Soft, nontender to palpation, ostomy bag in place draining feculent fluid Extremities: No lower extremity edema, able to raise right leg  Laboratory: Recent Labs  Lab 01/06/22 0530 01/07/22 0357 01/08/22 0619  WBC 14.4* 17.8* 19.8*  HGB 7.6* 7.8* 8.1*  HCT 24.4* 25.3* 27.1*  PLT 446* 489* 453*   Recent Labs  Lab 01/04/22 1141 01/05/22 0347 01/06/22 0530 01/07/22 0357 01/08/22 0619  NA 139 139 137 136 133*  K 3.8 3.8 3.5 3.7 4.5  CL 103 105 107 104 102  CO2 '28 24 25 25 23  '$ BUN 10 10 7* 6* 9  CREATININE 0.80 0.69 0.58* 0.58* 0.67  CALCIUM 8.7* 8.5* 7.7* 8.0* 8.2*  PROT 6.0* 5.6* 4.9*  --   --   BILITOT 0.5 0.5 0.7  --   --  ALKPHOS 70 69 57  --   --   ALT '10 11 10  '$ --   --   AST '16 21 15  '$ --   --   GLUCOSE 109* 121* 116* 109* 93    Imaging/Diagnostic Tests: No results found.   [name], MD 01/08/2022, 8:00 AM PGY-1, [place] Intern pager: [phone], text pages welcome

## 2022-01-08 NOTE — Progress Notes (Signed)
Physical Therapy Treatment Patient Details Name: Albert Love MRN: [id number] DOB: 1940/09/23 Today's Date: 01/08/2022   History of Present Illness 81 y/o male presenting from home with complaint of R UE weakness.  MRI showed a small acute to subacute infarct in the inferior right cerebellum.  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    PT Comments    Pt supine in bed.  Performed gt progression this session with RW.  He was very fatigued post ambulation.  Plan for HHPT remains appropriate.     Recommendations for follow up therapy are one component of a multi-disciplinary discharge planning process, led by the attending physician.  Recommendations may be updated based on patient status, additional functional criteria and insurance authorization.  Follow Up Recommendations  Home health PT     Assistance Recommended at Discharge PRN  Patient can return home with the following A little help with walking and/or transfers;A little help with bathing/dressing/bathroom   Equipment Recommendations  None recommended by PT    Recommendations for Other Services       Precautions / Restrictions Precautions Precautions: Fall Required Braces or Orthoses: Splint/Cast Splint/Cast: R wrist cock-up adn R radial n palsy splint Restrictions Weight Bearing Restrictions: No     Mobility  Bed Mobility Overal bed mobility: Needs Assistance Bed Mobility: Supine to Sit, Sit to Supine     Supine to sit: Supervision     General bed mobility comments: Increased time and effort to rise into sitting.  Pt continues to require assistance to move R LE but able to self assist.    Transfers Overall transfer level: Needs assistance Equipment used: Rolling walker (2 wheels) Transfers: Sit to/from Stand Sit to Stand: Min  guard           General transfer comment: Cues for hand placement and forward weight shifting to rise into standing.  Slow and guarded this session.    Ambulation/Gait Ambulation/Gait assistance: Min assist Gait Distance (Feet): 200 Feet Assistive device: Rolling walker (2 wheels) Gait Pattern/deviations: Step-through pattern, Trunk flexed, Decreased dorsiflexion - right, Decreased step length - right       General Gait Details: Cues for scap retraction, forward gaze and increasing stride on R LE.   Stairs             Wheelchair Mobility    Modified Rankin (Stroke Patients Only)       Balance Overall balance assessment: Needs assistance Sitting-balance support: Single extremity supported, No upper extremity supported, Feet supported Sitting balance-Leahy Scale: Good       Standing balance-Leahy Scale: Fair                              Cognition Arousal/Alertness: Awake/alert Behavior During Therapy: WFL for tasks assessed/performed Overall Cognitive Status: No family/caregiver present to determine baseline cognitive functioning                                          Exercises      General Comments        Pertinent Vitals/Pain Pain Assessment Pain Score: 0-No pain    Home Living  Prior Function            PT Goals (current goals can now be found in the care plan section) Acute Rehab PT Goals Patient Stated Goal: let's get on with it and out of here. Potential to Achieve Goals: Good Progress towards PT goals: Progressing toward goals    Frequency    Min 3X/week      PT Plan Current plan remains appropriate    Co-evaluation              AM-PAC PT "6 Clicks" Mobility   Outcome Measure  Help needed turning from your back to your side while in a flat bed without using bedrails?: A Little Help needed moving from lying on your back to sitting on the side of a flat  bed without using bedrails?: A Little Help needed moving to and from a bed to a chair (including a wheelchair)?: A Little Help needed standing up from a chair using your arms (e.g., wheelchair or bedside chair)?: A Little Help needed to walk in hospital room?: A Little Help needed climbing 3-5 steps with a railing? : A Little 6 Click Score: 18    End of Session   Activity Tolerance: Patient tolerated treatment well Patient left: in chair;with call bell/phone within reach Nurse Communication: Mobility status PT Visit Diagnosis: Unsteadiness on feet (R26.81);Muscle weakness (generalized) (M62.81)     Time: 6468-0321 PT Time Calculation (min) (ACUTE ONLY): 18 min  Charges:  $Gait Training: 8-22 mins                     Erasmo Leventhal , PTA Acute Rehabilitation Services Pager [phone] Office [phone]    Fantashia Shupert Eli Hose 01/08/2022, 1:25 PM

## 2022-01-09 DIAGNOSIS — I639 Cerebral infarction, unspecified: Secondary | ICD-10-CM | POA: Diagnosis not present

## 2022-01-09 DIAGNOSIS — K632 Fistula of intestine: Secondary | ICD-10-CM | POA: Diagnosis not present

## 2022-01-09 LAB — CBC
HCT: 25.9 % — ABNORMAL LOW (ref 39.0–52.0)
Hemoglobin: 7.8 g/dL — ABNORMAL LOW (ref 13.0–17.0)
MCH: 19.8 pg — ABNORMAL LOW (ref 26.0–34.0)
MCHC: 30.1 g/dL (ref 30.0–36.0)
MCV: 65.7 fL — ABNORMAL LOW (ref 80.0–100.0)
Platelets: 479 10*3/uL — ABNORMAL HIGH (ref 150–400)
RBC: 3.94 MIL/uL — ABNORMAL LOW (ref 4.22–5.81)
RDW: 18.2 % — ABNORMAL HIGH (ref 11.5–15.5)
WBC: 18.1 10*3/uL — ABNORMAL HIGH (ref 4.0–10.5)
nRBC: 0 % (ref 0.0–0.2)

## 2022-01-09 MED ORDER — OXYCODONE-ACETAMINOPHEN 5-325 MG PO TABS
1.0000 | ORAL_TABLET | Freq: Two times a day (BID) | ORAL | Status: DC
  Administered 2022-01-10 – 2022-01-16 (×13): 1 via ORAL
  Filled 2022-01-09 (×14): qty 1

## 2022-01-09 NOTE — Progress Notes (Signed)
Family Medicine Teaching Service Daily Progress Note Intern Pager: [phone]  Patient name: Albert Love record number: [id number] Date of birth: 1940/12/21 Age: 81 y.o. Gender: male  Primary Care Provider: Lenoria Chime, MD Consultants: Neurology Code Status: DNR  Pt Overview and Major Events to Date:  5/23: admitted  Assessment and Plan: Albert Love is a 81 y.o. male who presented with right upper extremity weakness and fall. 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 Feeling ok this morning. WBC 15.4. Completed Augmentin course - Continue morphine 5 mg every 4 hours as needed, Percocet 5 mg twice daily - Zofran 4 mg every 6 as needed - Narcan as needed  Right cerebellar stroke, chronic, stable  Radial neuropathy -Continue atorvastatin '80mg'$  daily - Continue aspirin  Microcytic anemia 2/2 colon cancer Hemoglobin 7.4 - Continue to monitor with CBC  Goals of care  Dispo Social work working on [employer].  Plan to have [facility] and hospice call him for discharge planning.  Family working on getting an aide through the [place] but plans to discharge prior to that.  Will need hospital bed and other DME prior to discharge which is in the process of being set up.  Plan to discharge either today or tomorrow.   FEN/GI: Regular diet PPx: SCDs  Disposition: Home with hospice   Subjective:  Overnight patient had 6 beats run non-sustained SVT HR was 101. asymptomatic   Objective: Temp:  [98.7 F (37.1 C)-99.5 F (37.5 C)] 99.2 F (37.3 C) (05/28 2004) Pulse Rate:   [86-100] 91 (05/28 2004) Resp:  [15-20] 20 (05/28 2004) BP: (118-138)/(69-85) 134/77 (05/28 2004) SpO2:  [98 %-100 %] 99 % (05/28 2004) Weight:  [63.2 kg] 63.2 kg (05/28 0500) Physical Exam: General: alert, pleasant, NAD Cardiovascular: RRR no murmurs  Respiratory: CTAB normal WOB Abdomen: soft, non distended, non tender. colostomy bag in place in RLQ draining feculent material Extremities: warm, dry. No edema   Laboratory: Recent Labs  Lab 01/07/22 0357 01/08/22 0619 01/09/22 0207  WBC 17.8* 19.8* 18.1*  HGB 7.8* 8.1* 7.8*  HCT 25.3* 27.1* 25.9*  PLT 489* 453* 479*   Recent Labs  Lab 01/04/22 1141 01/05/22 0347 01/06/22 0530 01/07/22 0357 01/08/22 0619  NA 139 139 137 136 133*  K 3.8 3.8 3.5 3.7 4.5  CL 103 105 107 104 102  CO2 '28 24 25 25 23  '$ BUN 10 10 7* 6* 9  CREATININE 0.80 0.69 0.58* 0.58* 0.67  CALCIUM 8.7* 8.5* 7.7* 8.0* 8.2*  PROT 6.0* 5.6* 4.9*  --   --   BILITOT 0.5 0.5 0.7  --   --   ALKPHOS 70 69 57  --   --   ALT '10 11 10  '$ --   --   AST '16 21 15  '$ --   --   GLUCOSE 109* 121* 116* 109* 93    Imaging/Diagnostic Tests: None new  Shary Key, DO 01/09/2022, 8:33 PM PGY-2, [place] Intern pager: [phone], text pages welcome

## 2022-01-09 NOTE — Progress Notes (Signed)
FPTS Brief Progress Note  S:patient sleeping   O: BP 136/74 (BP Location: Left Arm)   Pulse 91   Temp 98.7 F (37.1 C) (Oral)   Resp 15   Ht '5\' 11"'$  (1.803 m)   Wt 63.1 kg   SpO2 99%   BMI 19.40 kg/m     A/P: - Orders reviewed. Labs for AM ordered, which was adjusted as needed.   Gladys Damme, MD 01/09/2022, 2:10 AM PGY-3, [facility] Family Medicine Night Resident  Please page [phone] with questions.

## 2022-01-09 NOTE — Progress Notes (Signed)
Family Medicine Teaching Service Daily Progress Note Intern Pager: [phone]  Patient name: Albert Love record number: [id number] Date of birth: [birth year] Age: 81 y.o. Gender: male  Primary Care Provider: Lenoria Chime, MD Consultants: Neurology  Code Status: DNR   Pt Overview and Major Events to Date:  5/23: admitted   Assessment and Plan:   Sepsis secondary to right lower quadrant wound infection, resolved Patient afebrile overnight. WBC 18.1 downtrending from 19 yesterday.  -Continue Augmentin twice daily -For pain: Continue morphine 5 mg every 4 as needed, percocet '5mg'$  BID  -Narcan PRN  -Zofran 4 mg every 6 as needed IV  Right cerebellar stroke, chronic, stable  Radial neuropathy -Wrist cast in place on right side -Continue atorvastatin -Continue aspirin  Goals of care Patient currently on home hospice -Possible d/c tomorrow with son   Microcytic anemia secondary to colon cancer Hemoglobin 7.8 today, at baseline -Monitor CBC   FEN/GI: Regular diet PPx: SCDs Dispo:Home with home health   when home hospice with PT can be arranged .   Subjective:  Pt reports worsening abdominal overnight. Does not feel ready to go home yet. He reports the morphine has been helping him.   Objective: Temp:  [98.3 F (36.8 C)-99.5 F (37.5 C)] 99.5 F (37.5 C) (05/28 0753) Pulse Rate:  [86-93] 88 (05/28 0753) Resp:  [15-20] 18 (05/28 0753) BP: (109-138)/(69-85) 128/74 (05/28 0753) SpO2:  [97 %-100 %] 99 % (05/28 0753) Weight:  [63.2 kg] 63.2 kg (05/28 0500)   Physical Exam: General: Alert, no acute distress Cardio: Normal S1 and S2, RRR, no r/m/g Pulm: CTAB, normal work of breathing Abdomen: Bowel sounds normal. Abdomen, non distended, soft, generalized tenderness, colostomy bag in place in RLQ draining feculent material Extremities: No peripheral edema.  Neuro: Cranial nerves grossly intact   Laboratory: Recent Labs  Lab 01/07/22 0357 01/08/22 0619  01/09/22 0207  WBC 17.8* 19.8* 18.1*  HGB 7.8* 8.1* 7.8*  HCT 25.3* 27.1* 25.9*  PLT 489* 453* 479*   Recent Labs  Lab 01/04/22 1141 01/05/22 0347 01/06/22 0530 01/07/22 0357 01/08/22 0619  NA 139 139 137 136 133*  K 3.8 3.8 3.5 3.7 4.5  CL 103 105 107 104 102  CO2 '28 24 25 25 23  '$ BUN 10 10 7* 6* 9  CREATININE 0.80 0.69 0.58* 0.58* 0.67  CALCIUM 8.7* 8.5* 7.7* 8.0* 8.2*  PROT 6.0* 5.6* 4.9*  --   --   BILITOT 0.5 0.5 0.7  --   --   ALKPHOS 70 69 57  --   --   ALT '10 11 10  '$ --   --   AST '16 21 15  '$ --   --   GLUCOSE 109* 121* 116* 109* 93     Imaging/Diagnostic Tests: No results found.   Lattie Haw, MD 01/09/2022, 7:54 AM PGY-3, [place] Intern pager: [phone], text pages welcome

## 2022-01-09 NOTE — TOC Progression Note (Signed)
Transition of Care [facility]) - Progression Note    Patient Details  Name: Albert Love MRN: [id number] Date of Birth: 02/07/1941  Transition of Care [facility]) CM/SW Contact  Carles Collet, RN Phone Number: 01/09/2022, 1:11 PM  Clinical Narrative:    Spoke with paitent at the bedside. He states that was active with [facility] prior to [place]. He deferes dc planning to his son Haylen Shelnutt [phone]. Spoke w [name] and he states that they are working on getting an aid through the [place]. Discussed with him that may take a while to get approved and notes indicate that his dad is medically stable for DC as of yesterday. Informed him that we need to start making arrangements for DC prior to this being finalized.  [name] states that he will either stay with him at his house or his sister's house. He identifies that he will need a hospital bed and maybe some other DME. He is agreeable to have [facility] cal him to finalize DC plan. We discussed a DC goal of tomorrow.  Notified Candi Leash with [facility] and provided with the son's number to set up equipment.     Expected Discharge Plan: Home w Hospice Care Barriers to Discharge: Continued Medical Work up  Expected Discharge Plan and Services Expected Discharge Plan: [place] Acute Care Choice: NA Living arrangements for the past 2 months: Single Family Home                                       Social Determinants of Health (SDOH) Interventions    Readmission Risk Interventions     View : No data to display.

## 2022-01-10 DIAGNOSIS — K632 Fistula of intestine: Secondary | ICD-10-CM | POA: Diagnosis not present

## 2022-01-10 LAB — CULTURE, BLOOD (ROUTINE X 2)
Culture: NO GROWTH
Culture: NO GROWTH

## 2022-01-10 LAB — CBC
HCT: 24.8 % — ABNORMAL LOW (ref 39.0–52.0)
Hemoglobin: 7.4 g/dL — ABNORMAL LOW (ref 13.0–17.0)
MCH: 19.7 pg — ABNORMAL LOW (ref 26.0–34.0)
MCHC: 29.8 g/dL — ABNORMAL LOW (ref 30.0–36.0)
MCV: 66.1 fL — ABNORMAL LOW (ref 80.0–100.0)
Platelets: 500 10*3/uL — ABNORMAL HIGH (ref 150–400)
RBC: 3.75 MIL/uL — ABNORMAL LOW (ref 4.22–5.81)
RDW: 17.9 % — ABNORMAL HIGH (ref 11.5–15.5)
WBC: 15.4 10*3/uL — ABNORMAL HIGH (ref 4.0–10.5)
nRBC: 0 % (ref 0.0–0.2)

## 2022-01-10 LAB — BASIC METABOLIC PANEL
Anion gap: 8 (ref 5–15)
BUN: 8 mg/dL (ref 8–23)
CO2: 26 mmol/L (ref 22–32)
Calcium: 7.8 mg/dL — ABNORMAL LOW (ref 8.9–10.3)
Chloride: 100 mmol/L (ref 98–111)
Creatinine, Ser: 0.68 mg/dL (ref 0.61–1.24)
GFR, Estimated: >60 mL/min (ref >60)
Glucose, Bld: 137 mg/dL — ABNORMAL HIGH (ref 70–99)
Potassium: 3.5 mmol/L (ref 3.5–5.1)
Sodium: 134 mmol/L — ABNORMAL LOW (ref 135–145)

## 2022-01-10 NOTE — Progress Notes (Addendum)
CCMD called patient had episode of 6 beats run non-sustained SVT HR was 101. RN was at bedside, patient appears comfortable. Denies chest pain and discomfort. Paged family medicine intern via [place].

## 2022-01-10 NOTE — Progress Notes (Signed)
Occupational Therapy Treatment Patient Details Name: Albert Love MRN: [id number] DOB: [birth year] Today's Date: 01/10/2022   History of present illness 81 y/o male presenting from home with complaint of R UE weakness.  MRI showed a small acute to subacute infarct in the inferior right cerebellum.  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   OT comments  Pt seen for OT session, progression of RUE exercise. Pt able to complete wrist flexion/extension with and without use of wrist cock up splint, difficulty keeping wrist neutral with hand exercises. Pt able to perform functional activity with use of wrist cock up splint, able to don/doff both splints with min A for eating/functional tasks. Educated pt on continuing to use RUE functionally and complete exercises throughout the day. Pt verbalized understanding. Pt presenting with impairments listed below, will follow acutely. Continue to recommend OP OT at d/c.   Recommendations for follow up therapy are one component of a multi-disciplinary discharge planning process, led by the attending physician.  Recommendations may be updated based on patient status, additional functional criteria and insurance authorization.    Follow Up Recommendations  Outpatient OT    Assistance Recommended at Discharge Intermittent Supervision/Assistance  Patient can return home with the following  A little help with walking and/or transfers;A little help with bathing/dressing/bathroom;Assist for transportation;Assistance with cooking/housework;Help with stairs or ramp for entrance   Equipment Recommendations  None recommended by OT    Recommendations for Other Services PT consult    Precautions / Restrictions Precautions Precautions: Fall Required Braces or Orthoses:  Splint/Cast Splint/Cast: R wrist cock-up and R radial nerve palsy splint Restrictions Weight Bearing Restrictions: No       Mobility Bed Mobility Overal bed mobility: Needs Assistance Bed Mobility: Sit to Supine     Supine to sit: Min assist Sit to supine: Min assist   General bed mobility comments: for trunk elevation, to bring BLE"s into bed    Transfers                         Balance Overall balance assessment: Needs assistance Sitting-balance support: No upper extremity supported, Feet supported Sitting balance-Leahy Scale: Fair Sitting balance - Comments: pt with L lateral lean in sitting when attempting to don splint                                   ADL either performed or assessed with clinical judgement   ADL Overall ADL's : Needs assistance/impaired Eating/Feeding: Modified independent;Bed level               Upper Body Dressing : Sitting;Minimal assistance Upper Body Dressing Details (indicate cue type and reason): to don splint                 Functional mobility during ADLs: Min guard;Cueing for safety      Extremity/Trunk Assessment Upper Extremity Assessment Upper Extremity Assessment: RUE deficits/detail RUE Deficits / Details: With all splints off he has full wrist extension against gravity, can do composite flexion with wrist at neutral but not composite flexion with wrist neutral (flexes wrist), can oppose thumb to all digits, can adduct/abduct all digits, can pick up small objects, holds marker in hand throughout session while completing word  search RUE Coordination: decreased gross motor;decreased fine motor   Lower Extremity Assessment Lower Extremity Assessment: Defer to PT evaluation        Vision   Vision Assessment?: Vision impaired- to be further tested in functional context Additional Comments: pt reports decreased vision in R eye, reports cloudiness when staring at word search puzzle for ~7-8  mins   Perception Perception Perception: Within Functional Limits   Praxis Praxis Praxis: Not tested    Cognition Arousal/Alertness: Awake/alert Behavior During Therapy: WFL for tasks assessed/performed Overall Cognitive Status: Within Functional Limits for tasks assessed                                          Exercises Exercises: Other exercises Hand Exercises Wrist Flexion: AROM, Right, 5 reps, Supine Wrist Extension: AROM, 5 reps, Right, Supine Digit Composite Flexion: AROM, Right, 5 reps Composite Extension: AROM, 5 reps, Right, Supine Digit Composite Adduction: AROM, Right, 5 reps, Supine Digit Lifts: AROM, Right, Supine Other Exercises Other Exercises: able to complete word search finding words x3 during session, Other Exercises: isolated digit extension in pronation    Shoulder Instructions       General Comments family present at end of session    Pertinent Vitals/ Pain       Pain Assessment Pain Assessment: No/denies pain Pain Score: 4  Faces Pain Scale: Hurts little more Pain Location: abdomen with mobility Pain Descriptors / Indicators: Cramping, Sharp Pain Intervention(s): Limited activity within patient's tolerance  Home Living                                          Prior Functioning/Environment              Frequency  Min 3X/week        Progress Toward Goals  OT Goals(current goals can now be found in the care plan section)  Progress towards OT goals: Progressing toward goals  Acute Rehab OT Goals Patient Stated Goal: none stated OT Goal Formulation: With patient Time For Goal Achievement: 01/19/22 Potential to Achieve Goals: Good ADL Goals Pt Will Perform Lower Body Dressing: with supervision;sit to/from stand;sitting/lateral leans Pt Will Transfer to Toilet: with supervision;ambulating;regular height toilet Pt Will Perform Tub/Shower Transfer: with supervision;shower seat;ambulating;Shower  transfer;Tub transfer Additional ADL Goal #1: Pt will be able to demonstrate donning/doffing of splint and verbalize wear schedule in order to support RUE during functional tasks  Plan Discharge plan remains appropriate    Co-evaluation                 AM-PAC OT "6 Clicks" Daily Activity     Outcome Measure   Help from another person eating meals?: A Little Help from another person taking care of personal grooming?: A Little Help from another person toileting, which includes using toliet, bedpan, or urinal?: A Little Help from another person bathing (including washing, rinsing, drying)?: A Lot Help from another person to put on and taking off regular upper body clothing?: A Little Help from another person to put on and taking off regular lower body clothing?: A Lot 6 Click Score: 16    End of Session    OT Visit Diagnosis: Unsteadiness on feet (R26.81);Muscle weakness (generalized) (M62.81)   Activity Tolerance Patient tolerated treatment well   Patient  Left in bed;with call bell/phone within reach;with bed alarm set   Nurse Communication Mobility status        Time: 5831-6742 OT Time Calculation (min): 29 min  Charges: OT General Charges $OT Visit: 1 Visit OT Treatments $Therapeutic Activity: 23-37 mins  Lynnda Child, OTD, OTR/L Acute Rehab [phone] - [place] 01/10/2022, 11:15 AM

## 2022-01-10 NOTE — Plan of Care (Signed)
Patient is afebrile during the shift. Still complained abdominal pain, PRN medicine were given, prefers morphine rather than oxycodone. Weakness on right upper extremity still the same. Will continue to monitor.  Problem: Education: Goal: Knowledge of disease or condition will improve Outcome: Progressing   Problem: Coping: Goal: Will verbalize positive feelings about self Outcome: Progressing   Problem: Health Behavior/Discharge Planning: Goal: Ability to manage health-related needs will improve Outcome: Progressing   Problem: Self-Care: Goal: Ability to participate in self-care as condition permits will improve Outcome: Progressing Goal: Verbalization of feelings and concerns over difficulty with self-care will improve Outcome: Progressing

## 2022-01-10 NOTE — Progress Notes (Signed)
Called hospice service at [phone] to discuss whether Mr. Petsch could receive home health PT/OT through hospice as recommended by our PT/OT here.  Received call back from [place] at [phone]. She reports that traditionally hospice will NOT pay for PT/OT. I explained the patient's wish for PT to be more ambulatory and be able to get himself up to use the restroom. Ms. Starla Link said she'd reach out to [facility], their nurse who works a lot with [employer], to see what our options are. He may be able to go home with their agency's home health services now and resume hospice later.   Ezequiel Essex, MD

## 2022-01-10 NOTE — Progress Notes (Signed)
FPTS Brief Progress Note  S: Patient awake when I came into the room because he was currently getting his vitals checked. He endorses feeling fine considering everything going on. Just endorses some back pain where his wound is and its uncomfortable to change positions.   Prior to me checking on patient, we were paged that patient had an episode of 6 beats run non sustained SVT. Patient asymptomatic during episode and after when I examined him    O: BP 126/78 (BP Location: Left Arm)   Pulse 98   Temp 99.2 F (37.3 C) (Oral)   Resp 18   Ht '5\' 11"'$  (1.803 m)   Wt 63.2 kg   SpO2 99%   BMI 19.43 kg/m   General: alert, pleasant, NAD Resp: breathing comfortably on RA CV: RRR   A/P: Continue current management. Plan for discharge today or tomorrow pending HH and equipment sent up. Orders reviewed. Labs for AM ordered, which was adjusted as needed.    Shary Key, DO 01/10/2022, 12:51 AM PGY-2, [facility] Night Resident  Please page [phone] with questions.

## 2022-01-10 NOTE — TOC Progression Note (Signed)
Transition of Care [facility]) - Progression Note    Patient Details  Name: Albert Love MRN: [id number] Date of Birth: 1941/03/09  Transition of Care [facility]) CM/SW [place], [place] Phone Number: 01/10/2022, 10:20 AM  Clinical Narrative:   CSW spoke with [facility], and hospital bed delivery is still pending for today. [facility] to discuss delivery with family. CSW to follow.    Expected Discharge Plan: Home w Hospice Care Barriers to Discharge: Continued Medical Work up  Expected Discharge Plan and Services Expected Discharge Plan: [place] Acute Care Choice: NA Living arrangements for the past 2 months: Single Family Home                                       Social Determinants of Health (SDOH) Interventions    Readmission Risk Interventions     View : No data to display.

## 2022-01-10 NOTE — Progress Notes (Signed)
Physical Therapy Treatment Patient Details Name: Albert Love MRN: [id number] DOB: 1941-04-18 Today's Date: 01/10/2022   History of Present Illness 81 y/o male presenting from home with complaint of R UE weakness.  MRI showed a small acute to subacute infarct in the inferior right cerebellum.  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    PT Comments    Pt progressing well towards all goals. Focused on bed mobility with HOB lowered to mimic home set up and optimal gait mechanics with RW. Pt continues to have decreased R UE/LE strength in addition to core strength, impaired balance, and increased falls risk. Pt to benefit from HHPT to progress function towards independence. Acute PT to cont to follow.    Recommendations for follow up therapy are one component of a multi-disciplinary discharge planning process, led by the attending physician.  Recommendations may be updated based on patient status, additional functional criteria and insurance authorization.  Follow Up Recommendations  Home health PT     Assistance Recommended at Discharge Intermittent Supervision/Assistance  Patient can return home with the following A little help with walking and/or transfers;A little help with bathing/dressing/bathroom   Equipment Recommendations  Rolling walker (2 wheels)    Recommendations for Other Services       Precautions / Restrictions Precautions Precautions: Fall Required Braces or Orthoses: Splint/Cast Splint/Cast: R wrist cock-up and R radial nerve palsy splint Restrictions Weight Bearing Restrictions: No     Mobility  Bed Mobility Overal bed mobility: Needs Assistance Bed Mobility: Supine to Sit     Supine to sit: Min assist     General bed mobility comments: worked on rolling to the L and pushing  self up from sidelying to mimic home set up, pt with difficulty managing R LE, educated on using L LE to hook under R LE to aide it off the bed until R LE stronger, increased time, HOB at 30 deg, minA for R LE management    Transfers Overall transfer level: Needs assistance Equipment used: Rolling walker (2 wheels) Transfers: Sit to/from Stand Sit to Stand: Min guard           General transfer comment: verbal cues for safe hand placment, increased time, min guard due to posterior lean/bracing against bed    Ambulation/Gait Ambulation/Gait assistance: Min assist Gait Distance (Feet): 180 Feet Assistive device: Rolling walker (2 wheels) Gait Pattern/deviations: Step-through pattern, Trunk flexed, Decreased dorsiflexion - right, Decreased step length - right Gait velocity: dec Gait velocity interpretation: <1.31 ft/sec, indicative of household ambulator   General Gait Details: Cues for scap retraction, forward gaze and step through pattern making sure heel passes the toes of contralateral foot, with verbal cues and help with RW to continue forward momentum pt able to maintain increased step length but had more difficulty with maintaining upright posture   Stairs             Wheelchair Mobility    Modified Rankin (Stroke Patients Only) Modified Rankin (Stroke Patients Only) Pre-Morbid Rankin Score: No significant disability Modified Rankin: Moderately severe disability     Balance Overall balance assessment: Needs assistance Sitting-balance support: No upper extremity supported, Feet supported Sitting balance-Leahy Scale: Fair Sitting balance - Comments: pt with L lateral lean in sitting when attempting to don splint   Standing balance support: No upper extremity  supported Standing balance-Leahy Scale: Fair Standing balance comment: statically fair otherwise needs minimal external support                            Cognition Arousal/Alertness:  Awake/alert Behavior During Therapy: WFL for tasks assessed/performed Overall Cognitive Status: Within Functional Limits for tasks assessed                                 General Comments: pt with great effort and understanding of PT's recommendations in trying to mimic home set up for transfers        Exercises      General Comments General comments (skin integrity, edema, etc.): pt able to take R splint on/off in sitting vs harder time in supine      Pertinent Vitals/Pain Pain Assessment Pain Assessment: No/denies pain    Home Living                          Prior Function            PT Goals (current goals can now be found in the care plan section) Acute Rehab PT Goals PT Goal Formulation: With patient Time For Goal Achievement: 01/20/22 Potential to Achieve Goals: Good Progress towards PT goals: Progressing toward goals    Frequency    Min 4X/week      PT Plan Current plan remains appropriate    Co-evaluation              AM-PAC PT "6 Clicks" Mobility   Outcome Measure  Help needed turning from your back to your side while in a flat bed without using bedrails?: A Little Help needed moving from lying on your back to sitting on the side of a flat bed without using bedrails?: A Little Help needed moving to and from a bed to a chair (including a wheelchair)?: A Little Help needed standing up from a chair using your arms (e.g., wheelchair or bedside chair)?: A Little Help needed to walk in hospital room?: A Little Help needed climbing 3-5 steps with a railing? : A Little 6 Click Score: 18    End of Session Equipment Utilized During Treatment: Gait belt Activity Tolerance: Patient tolerated treatment well Patient left: in chair;with call bell/phone within reach;with chair alarm set Nurse Communication: Mobility status PT Visit Diagnosis: Unsteadiness on feet (R26.81);Muscle weakness (generalized) (M62.81)     Time:  3244-0102 PT Time Calculation (min) (ACUTE ONLY): 23 min  Charges:  $Gait Training: 8-22 mins $Therapeutic Activity: 8-22 mins                     Kittie Plater, PT, DPT Acute Rehabilitation Services Secure chat preferred Office #: [phone]    Berline Lopes 01/10/2022, 3:14 PM

## 2022-01-10 NOTE — Progress Notes (Signed)
FPTS Brief Progress Note  S:Patient sleeping    O: BP 126/84 (BP Location: Left Arm)   Pulse (!) 102   Temp 99.1 F (37.3 C) (Oral)   Resp 18   Ht '5\' 11"'$  (1.803 m)   Wt 62.2 kg   SpO2 99%   BMI 19.13 kg/m     A/P: Plan to discharge home 5/30 with HH/hospice  - Labs not ordered given patient is discharging    Shary Key, DO 01/10/2022, 9:35 PM PGY-2, [facility] Night Resident  Please page [phone] with questions.

## 2022-01-11 ENCOUNTER — Other Ambulatory Visit: Payer: Self-pay | Admitting: Student

## 2022-01-11 ENCOUNTER — Telehealth: Payer: Self-pay | Admitting: Family Medicine

## 2022-01-11 DIAGNOSIS — K632 Fistula of intestine: Secondary | ICD-10-CM | POA: Diagnosis not present

## 2022-01-11 DIAGNOSIS — C762 Malignant neoplasm of abdomen: Secondary | ICD-10-CM

## 2022-01-11 LAB — CBC
HCT: 26.4 % — ABNORMAL LOW (ref 39.0–52.0)
Hemoglobin: 8 g/dL — ABNORMAL LOW (ref 13.0–17.0)
MCH: 20 pg — ABNORMAL LOW (ref 26.0–34.0)
MCHC: 30.3 g/dL (ref 30.0–36.0)
MCV: 66 fL — ABNORMAL LOW (ref 80.0–100.0)
Platelets: 519 10*3/uL — ABNORMAL HIGH (ref 150–400)
RBC: 4 MIL/uL — ABNORMAL LOW (ref 4.22–5.81)
RDW: 18.4 % — ABNORMAL HIGH (ref 11.5–15.5)
WBC: 16 10*3/uL — ABNORMAL HIGH (ref 4.0–10.5)
nRBC: 0 % (ref 0.0–0.2)

## 2022-01-11 MED ORDER — POLYETHYLENE GLYCOL 3350 17 G PO PACK
17.0000 g | PACK | Freq: Every day | ORAL | Status: DC
  Administered 2022-01-14: 17 g via ORAL
  Filled 2022-01-11 (×3): qty 1

## 2022-01-11 MED ORDER — ASPIRIN 81 MG PO TBEC: 81.0000 mg | DELAYED_RELEASE_TABLET | Freq: Every day | ORAL | 12 refills | Status: DC

## 2022-01-11 NOTE — Telephone Encounter (Signed)
Clinical info completed on [employer] form.  Placed form in Dr. Trina Ao box for completion.    When form is completed, please route note to "RN Team" and place in wall pocket in front office.   Albert Love, CMA

## 2022-01-11 NOTE — Progress Notes (Signed)
Physical Therapy Treatment Patient Details Name: Albert Love MRN: [id number] DOB: 1941/05/23 Today's Date: 01/11/2022   History of Present Illness 81 y/o male presenting from home with complaint of R UE weakness.  MRI showed a small acute to subacute infarct in the inferior right cerebellum.  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    PT Comments    Pt with abdominal pain today, worse than yesterday and onset of dizziness/whooziness upon standing limiting ability to progress ambulation today. Pt unable to tolerate standing > 10 seconds, unable to clear R foot today and requiring increased assist for transfers. RN notified. BP stable at 128/78. Acute PT to cont to follow.    Recommendations for follow up therapy are one component of a multi-disciplinary discharge planning process, led by the attending physician.  Recommendations may be updated based on patient status, additional functional criteria and insurance authorization.  Follow Up Recommendations  Home health PT     Assistance Recommended at Discharge Intermittent Supervision/Assistance  Patient can return home with the following A little help with walking and/or transfers;A little help with bathing/dressing/bathroom   Equipment Recommendations  Rolling walker (2 wheels)    Recommendations for Other Services       Precautions / Restrictions Precautions Precautions: Fall Required Braces or Orthoses: Splint/Cast Splint/Cast: R wrist cock-up and R radial nerve palsy splint Restrictions Weight Bearing Restrictions: No     Mobility  Bed Mobility Overal bed mobility: Needs Assistance Bed Mobility: Supine to Sit     Supine to sit: Min assist Sit to supine: Min assist   General bed mobility comments: minA for LE management and to aide with  trunk elevation. pt with increased abdominal pain today limiting indep mobility    Transfers Overall transfer level: Needs assistance Equipment used: Rolling walker (2 wheels) Transfers: Sit to/from Stand Sit to Stand: Mod assist           General transfer comment: modA to power up, pt felt dizzy upon standing, attempted to gather self and tried marching in place but unable. BP 128/78    Ambulation/Gait Ambulation/Gait assistance: Min assist Gait Distance (Feet): 1 Feet Assistive device: Rolling walker (2 wheels) Gait Pattern/deviations: Trunk flexed, Step-to pattern       General Gait Details: attempted to amb x 3 however pt unable to tolerate. Pt with abdominal pain and decreased ability to advance R LE. with modA pt able to take 5 steps towards head of bed, limited ability to advance/clear R LE   Stairs             Wheelchair Mobility    Modified Rankin (Stroke Patients Only) Modified Rankin (Stroke Patients Only) Pre-Morbid Rankin Score: No significant disability Modified Rankin: Moderately severe disability     Balance Overall balance assessment: Needs assistance Sitting-balance support: No upper extremity supported, Feet supported Sitting balance-Leahy Scale: Fair     Standing balance support: No upper extremity supported Standing balance-Leahy Scale: Poor Standing balance comment: limited by abdominal pain today requiring more assist                            Cognition Arousal/Alertness: Awake/alert Behavior During Therapy: WFL for tasks assessed/performed Overall Cognitive Status: Within Functional Limits for tasks assessed  General Comments: pt reports not feeling well today but wants to try therapy but then reported he needed lay down due to pain and dizziness        Exercises      General Comments General comments (skin integrity, edema, etc.): pt with abdominal pain today limiting  mobility, BP 128/78      Pertinent Vitals/Pain Pain Assessment Pain Assessment: 0-10 Pain Score: 6  Pain Location: abdomen with mobility, cramping Pain Descriptors / Indicators: Cramping, Sharp Pain Intervention(s): Limited activity within patient's tolerance    Home Living                          Prior Function            PT Goals (current goals can now be found in the care plan section) Acute Rehab PT Goals PT Goal Formulation: With patient Time For Goal Achievement: 01/20/22 Potential to Achieve Goals: Good Progress towards PT goals: Not progressing toward goals - comment (pt with abdominal pain today)    Frequency    Min 4X/week      PT Plan Current plan remains appropriate    Co-evaluation              AM-PAC PT "6 Clicks" Mobility   Outcome Measure  Help needed turning from your back to your side while in a flat bed without using bedrails?: A Little Help needed moving from lying on your back to sitting on the side of a flat bed without using bedrails?: A Little Help needed moving to and from a bed to a chair (including a wheelchair)?: A Little Help needed standing up from a chair using your arms (e.g., wheelchair or bedside chair)?: A Lot Help needed to walk in hospital room?: A Lot Help needed climbing 3-5 steps with a railing? : A Lot 6 Click Score: 15    End of Session Equipment Utilized During Treatment: Gait belt Activity Tolerance: Patient tolerated treatment well Patient left: with call bell/phone within reach;in bed;with bed alarm set Nurse Communication: Mobility status PT Visit Diagnosis: Unsteadiness on feet (R26.81);Muscle weakness (generalized) (M62.81)     Time: 5883-2549 PT Time Calculation (min) (ACUTE ONLY): 21 min  Charges:  $Therapeutic Activity: 8-22 mins                     Kittie Plater, PT, DPT Acute Rehabilitation Services Secure chat preferred Office #: [phone]    Berline Lopes 01/11/2022, 2:03  PM

## 2022-01-11 NOTE — Progress Notes (Signed)
Chaplain responded to [facility] consult.  Pt was in his bed with therapist in room as he had just finished. Chaplain inquired about consult for Notary. Mr Maryland stated that his son was able to get the documents notarized yesterday. He stated he was fairly certain. Chaplain informed Mr Welcher that another consult can be made if pt's son arrives and informs him that he still needs notary services.  Please page as further needs arise.  Donald Prose. Elyn Peers, M.Div. [facility] Chaplain Pager [phone] Office [phone]

## 2022-01-11 NOTE — Progress Notes (Signed)
Spoke with hospice manager for [facility] and I put in referral for home hospice for Mr. [place]. Will inform her when patient discharging. [phone]).  Also spoke with son [name] and will inform when patient discharging. All questions answered

## 2022-01-11 NOTE — Discharge Summary (Addendum)
[facility] Discharge Summary  Patient name: Albert Love record number: [id number] Date of birth: 10-29-1940 Age: 81 y.o. Gender: male Date of Admission: 01/04/2022  Date of Discharge: 01/12/2022 Admitting Physician: Nita Sells, MD  Primary Care Provider: Gladys Damme, MD Consultants: Neurology   Indication for Hospitalization: Sepsis  Discharge Diagnoses/Problem List:  Principal Problem:   Acute ischemic stroke [facility]) Active Problems:   Anemia   Sepsis due to cellulitis [facility])   Colon cancer ([place])   HLD (hyperlipidemia)   Tobacco abuse   GERD (gastroesophageal reflux disease)   Stroke (cerebrum) ([place])   Abdominal wall fistula   Disposition: Home with Hospice  Discharge Condition: Stable  Discharge Exam:  General: NAD, alert and responsive Cardiovascular: RRR no m/r/g Respiratory: CTAB no w/r/c Abdomen: nontender, RLQ wound with ostomy in place Extremities: No LE edema, able to lift RLE, right wrist in splint, extend fingers somewhat  Brief Hospital Course:  Albert Love is a 81 y.o. male who presented with right upper extremity weakness and fall. 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  CT head showed small age-indeterminate infarct in the right cerebellum. MRI brain showed small acute or subacute infarct in the inferior right cerebellum, with mild edema without mass effect. Neurology was consulted who recommended that pattern of weakness suggests primarily radial nerve injury but some  component from ulnar nerve mediated muscles as well likely represents mild brachial plexus injury. MRI is negative for any left hemispheric infarct.  Recommended he may benefit with outpatient nerve conduction study.   Asymptomatic right cerebellar stroke  Pt was started on ASA and aggressive risk factor modification. Echo showed  EF 55-60% with grade I diastolic dysfunction and significant valvular abnormalities. CT head and neck showed no intracranial large vessel occlusion or significant stenosis and no hemodynamically significant stenosis in the neck.   Sepsis 2/2 fistula in RLQ (from previous JP drain) LA elevated to 3.3.>1.4. WBC elevated to 16.8 initially with neutrophilic predominance. Tachypneic as well. CXR and UA negative. Pt initially received Vancomycin, Rocephin and mIVF and transitioned to Augmentin PO. CEA was pending at discharge. At discharge patients vitals were stable and was discharged on augmentin course ending 5/28.   Colon cancer  Goals of Care Pt no longer pursuing chemo/radiation and is in hospice. CT abdomen showed interval increase in size of the complex, heterogeneous mass involving the cecum and proximal to mid ascending colon. Marked severity prostatomegaly stable. Patient would like to avoid future hospitalizations and goal to live in his apartment as long as possible. Patient wanted PT for weakness/radial neuropathy.  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 At discharge it was 8.0, stable  Issues for Follow Up:  EMG/nerve conduction studies in 3-4 weeks  If worsening symptoms neuro recommend MRI brachial plexus for possible malignant involvement  Pt would like to avoid future hospitalizations and remain in his apt as long as possible  Significant Procedures: None   Significant Labs and Imaging:  Recent Labs  Lab 01/09/22 0207 01/10/22 0449 01/11/22 0609  WBC 18.1* 15.4* 16.0*  HGB 7.8* 7.4* 8.0*  HCT 25.9* 24.8* 26.4*  PLT 479*  500* 519*   Recent Labs  Lab 01/04/22 2100 01/05/22 0347 01/05/22 0347 01/06/22 0530 01/07/22 0357 01/08/22 0619 01/10/22 0449  NA  --  139  --  137 136 133* 134*  K  --  3.8   < > 3.5 3.7 4.5 3.5  CL  --  105  --  107 104 102 100  CO2  --  24  --  '25 25 23 26  '$ GLUCOSE  --  121*  --  116* 109* 93 137*  BUN  --  10  --  7* 6* 9 8  CREATININE  --  0.69  --  0.58* 0.58* 0.67 0.68  CALCIUM  --  8.5*  --  7.7* 8.0* 8.2* 7.8*  MG 1.9 1.8  --   --   --   --   --   ALKPHOS  --  69  --  57  --   --   --   AST  --  21  --  15  --   --   --   ALT  --  11  --  10  --   --   --   ALBUMIN  --  2.2*  --  1.8*  --   --   --    < > = values in this interval not displayed.   CT ANGIO HEAD NECK W WO CM  Result Date: 01/04/2022 CLINICAL DATA:  Found on the ground, new onset right hand weakness, infarct in the right cerebellum on same-day MRI EXAM: CT ANGIOGRAPHY HEAD AND NECK TECHNIQUE: Multidetector CT imaging of the head and neck was performed using the standard protocol during bolus administration of intravenous contrast. Multiplanar CT image reconstructions and MIPs were obtained to evaluate the vascular anatomy. Carotid stenosis measurements (when applicable) are obtained utilizing NASCET criteria, using the distal internal carotid diameter as the denominator. RADIATION DOSE REDUCTION: This exam was performed according to the departmental dose-optimization program which includes automated exposure control, adjustment of the mA and/or kV according to patient size and/or use of iterative reconstruction technique. CONTRAST:  131m OMNIPAQUE IOHEXOL 350 MG/ML SOLN COMPARISON:  10/19/2020 CTA head and neck, correlation is also made with 01/04/2022 CT head FINDINGS: CT HEAD FINDINGS For noncontrast findings, please see same day CT head. CTA NECK FINDINGS Aortic arch: Standard branching. Imaged portion shows no evidence of aneurysm or dissection. No significant stenosis of the major arch vessel origins. Aortic  atherosclerosis. Right carotid system: No evidence of dissection, occlusion, or hemodynamically significant stenosis (greater than 50%). Calcifications at the bifurcation in the proximal right ICA are not hemodynamically significant. Left carotid system: No evidence of dissection, occlusion, or hemodynamically significant stenosis (greater than 50%). Calcifications at the bifurcation and in the proximal left ICA are not hemodynamically significant. Vertebral arteries: No evidence of dissection, occlusion, or hemodynamically significant stenosis (greater than 50%). Skeleton: No acute osseous abnormality. Large anterior osteophytes C2-C4, which indent the posterior aspect of the pharynx. Other neck: Hypoenhancing left thyroid nodule, which measures up to 1.4 cm, unchanged from the prior exam, for which no follow-up is indicated. (Reference: J Am Coll Radiol. 2015 Feb;12(2): 143-50) Upper chest: Centrilobular and paraseptal emphysema. No focal pulmonary opacity or pleural effusion. Review of the MIP images confirms the above findings CTA HEAD FINDINGS Anterior circulation: Both internal carotid arteries are patent to the termini, without significant stenosis. A1 segments patent. Normal anterior communicating artery. Anterior cerebral arteries are  patent to their distal aspects. No M1 stenosis or occlusion. Normal MCA bifurcations. Distal MCA branches perfused and symmetric. Posterior circulation: Vertebral arteries patent to the vertebrobasilar junction without stenosis. Posterior inferior cerebral arteries patent bilaterally. Basilar patent to its distal aspect. Superior cerebellar arteries patent bilaterally. Patent P1 segments. PCAs perfused to their distal aspects without stenosis. The left posterior communicating artery is visualized. Venous sinuses: As permitted by contrast timing, patent. Anatomic variants: None significant. Review of the MIP images confirms the above findings IMPRESSION: 1.  No intracranial  large vessel occlusion or significant stenosis. 2.  No hemodynamically significant stenosis in the neck. 3. Aortic Atherosclerosis (ICD10-I70.0) and Emphysema (ICD10-J43.9). Electronically Signed   By: Merilyn Baba M.D.   On: 01/04/2022 21:12   CT Head Wo Contrast  Result Date: 01/04/2022 CLINICAL DATA:  Neuro deficit, acute, stroke suspected probable subacute cva vs metastatic disease EXAM: CT HEAD WITHOUT CONTRAST TECHNIQUE: Contiguous axial images were obtained from the base of the skull through the vertex without intravenous contrast. RADIATION DOSE REDUCTION: This exam was performed according to the departmental dose-optimization program which includes automated exposure control, adjustment of the mA and/or kV according to patient size and/or use of iterative reconstruction technique. COMPARISON:  CT October 19, 2020. FINDINGS: Brain: Small age indeterminate infarct in the right cerebellum. Remote appearing infarct in the right corona radiata. No evidence of acute hemorrhage, hydrocephalus, extra-axial collection or mass lesion/mass effect. Moderate patchy white matter hypodensities, nonspecific but compatible with chronic microvascular ischemic disease. Vascular: No hyperdense vessel identified. Calcific intracranial sclerosis. Skull: No acute fracture. Sinuses/Orbits: Clear sinuses.  No acute orbital findings. Other: No mastoid effusions. IMPRESSION: 1. Small age indeterminate infarct in the right cerebellum, new since 2022. Recommend MRI with contrast for far more sensitive evaluation for acute infarct and metastatic disease. 2. Moderate chronic microvascular disease and remote appearing right corona radiata infarct Electronically Signed   By: Margaretha Sheffield M.D.   On: 01/04/2022 14:19   MR Brain W and Wo Contrast  Result Date: 01/04/2022 CLINICAL DATA:  Neuro deficit, acute, stroke suspected EXAM: MRI HEAD WITHOUT AND WITH CONTRAST TECHNIQUE: Multiplanar, acute multiecho pulse sequences of the  brain and surrounding structures were obtained without and with intravenous contrast. CONTRAST:  5m GADAVIST GADOBUTROL 1 MMOL/ML IV SOLN COMPARISON:  Same day CT head.  MRI head October 19, 2020. FINDINGS: Brain: Small acute or subacute infarct in the inferior right cerebellum (series 3, image 12), which is nonenhancing. Mild edema without mass effect. Moderate additional scattered T2/FLAIR hyperintensities in the white matter, nonspecific but compatible with chronic microvascular ischemic disease. No pathologic enhancement. No evidence of acute hemorrhage hydrocephalus, mass lesion, extra-axial fluid collection, or midline shift. Vascular: Major arterial flow voids are maintained at the skull base. Skull and upper cervical spine: Normal marrow signal. Partially imaged degenerative changes in the cervical spine. Sinuses/Orbits: Largely clear sinuses.  No acute orbital findings. Other: No sizable mastoid effusions. IMPRESSION: 1. Small acute or subacute infarct in the inferior right cerebellum. Mild edema without mass effect. 2. Moderate chronic microvascular disease. Electronically Signed   By: FMargaretha SheffieldM.D.   On: 01/04/2022 17:04   CT ABDOMEN PELVIS W CONTRAST  Result Date: 01/04/2022 CLINICAL DATA:  Found on the ground. EXAM: CT ABDOMEN AND PELVIS WITH CONTRAST TECHNIQUE: Multidetector CT imaging of the abdomen and pelvis was performed using the standard protocol following bolus administration of intravenous contrast. RADIATION DOSE REDUCTION: This exam was performed according to the departmental dose-optimization program which includes  automated exposure control, adjustment of the mA and/or kV according to patient size and/or use of iterative reconstruction technique. CONTRAST:  175m OMNIPAQUE IOHEXOL 350 MG/ML SOLN COMPARISON:  November 05, 2021 FINDINGS: Lower chest: No acute abnormality. Hepatobiliary: A stable 10 mm focus of well-defined low attenuation is seen within the posterior aspect of the  left lobe of the liver. No gallstones, gallbladder wall thickening, or biliary dilatation. Pancreas: Unremarkable. No pancreatic ductal dilatation or surrounding inflammatory changes. Spleen: Normal in size without focal abnormality. Adrenals/Urinary Tract: Adrenal glands are unremarkable. Kidneys are normal, without renal calculi, focal lesion, or hydronephrosis. Bladder is unremarkable. Stomach/Bowel: Stomach and small bowel are within normal limits. A 10.8 cm x 8.5 cm x 15.2 cm complex, heterogeneous mass seen involving the cecum and proximal to mid ascending colon. This is increased in size when compared to the prior study (measures approximately 10.8 cm x 8.5 cm x 15.2 cm) and extends inferiorly along the anterior aspect of the right iliac bone, with invasion of the anterior wall of the right hip. Vascular/Lymphatic: Aortic atherosclerosis. No enlarged abdominal or pelvic lymph nodes. Reproductive: The prostate gland is markedly enlarged and heterogeneous in appearance. This extends in to the base of the urinary bladder and is stable in appearance when compared to the prior study. Other: A mild amount of abdominopelvic free fluid is seen. Musculoskeletal: Multilevel degenerative changes are seen throughout the lumbar spine. IMPRESSION: 1. Interval increase in size of the complex, heterogeneous mass involving the cecum and proximal to mid ascending colon, as described above. This is consistent with a primary colon malignancy. 2. Marked severity prostatomegaly, stable in appearance when compared to the prior study. 3. Mild amount of abdominopelvic free fluid. 4. Small, stable hepatic cyst or hemangioma. 5. Aortic atherosclerosis. Aortic Atherosclerosis (ICD10-I70.0). Electronically Signed   By: TVirgina NorfolkM.D.   On: 01/04/2022 21:13   CT ABDOMEN PELVIS W CONTRAST  Result Date: 11/05/2021 CLINICAL DATA:  Cecal carcinoma. Previous right lower quadrant abscess, post percutaneous drain placement  06/07/2021, multiple a removed 09/01/2021. Skin lesion with drainage from the same region. EXAM: CT ABDOMEN AND PELVIS WITH CONTRAST TECHNIQUE: Multidetector CT imaging of the abdomen and pelvis was performed using the standard protocol following bolus administration of intravenous contrast. RADIATION DOSE REDUCTION: This exam was performed according to the departmental dose-optimization program which includes automated exposure control, adjustment of the mA and/or kV according to patient size and/or use of iterative reconstruction technique. CONTRAST:  1061mISOVUE-300 IOPAMIDOL (ISOVUE-300) INJECTION 61% COMPARISON:  07/21/2021 and previous FINDINGS: Lower chest: No acute abnormality. Hepatobiliary: Stable 1 cm cyst in hepatic segment 3; no follow-up required. No new liver lesion. No calcified gallstones. No biliary ductal dilatation. Pancreas: Unremarkable. No pancreatic ductal dilatation or surrounding inflammatory changes. Spleen: Normal in size without focal abnormality. Adrenals/Urinary Tract: Adrenal glands are unremarkable. Kidneys are normal, without renal calculi, focal lesion, or hydronephrosis. Bladder is unremarkable. Stomach/Bowel: Stomach and small bowel unremarkable. 13.3 x 9.8 x 6 cm complex process which appears contiguous with the ascending colon, extending into the soft tissues posteriorly to the psoas, and laterally to the subcutaneous tissues at the level of the iliac fossa, along the course of the previous drain catheter. There is central low attenuation and irregular thickened somewhat nodular peripheral enhancement. No evidence of bowel obstruction. Hepatic flexure is unremarkable, and the more distal colon is nondistended. Vascular/Lymphatic: Progressive right lower quadrant mesenteric adenopathy up to 2.4 cm (Im35,Se2) medial to the ascending colon process . Aortoiliac calcified atheromatous  plaque without aneurysm. Reproductive: Marked prostate enlargement protruding into the lumen of  the urinary bladder. Other: Small volume pelvic ascites.  No free air. Musculoskeletal: Bilateral hip DJD.  No acute findings. IMPRESSION: 1. Interval progression of ascending colon tumor with regional invasion into right lower quadrant body wall, which likely accounts for the drainage at the previous catheter site. I do not think replacing percutaneous catheter would control this adequately. Consider wound/ostomy nursing consultation for wound control. 2. Progression of mesenteric adenopathy central to the right colon mass. No evidence of distal metastatic disease. 3.  Aortic Atherosclerosis (ICD10-170.0). Electronically Signed   By: Lucrezia Europe M.D.   On: 11/05/2021 16:53   DG Chest Port 1 View  Result Date: 01/04/2022 CLINICAL DATA:  Provided history: Weakness. Additional history provided: Patient found down. Right hand weakness. EXAM: PORTABLE CHEST 1 VIEW COMPARISON:  Prior chest radiographs 06/19/2021 and earlier. FINDINGS: Heart size within normal limits. No appreciable airspace consolidation. No evidence of pleural effusion or pneumothorax. No acute bony abnormality identified. IMPRESSION: No evidence of acute cardiopulmonary abnormality. Electronically Signed   By: Kellie Simmering D.O.   On: 01/04/2022 11:40   ECHOCARDIOGRAM COMPLETE  Result Date: 01/05/2022    ECHOCARDIOGRAM REPORT   Patient Name:   ASEEM SESSUMS Date of Exam: 01/05/2022 Medical Rec #:  [id number]      Height:       71.0 in Accession #:    [id number]     Weight:       122.0 lb Date of Birth:  1940-11-15      BSA:          1.709 m Patient Age:    54 years       BP:           136/74 mmHg Patient Gender: M              HR:           88 bpm. Exam Location:  Inpatient Procedure: 2D Echo, Cardiac Doppler and Color Doppler Indications:    CVA  History:        Patient has prior history of Echocardiogram examinations, most                 recent 10/20/2020.  Sonographer:    Luisa Hart RDCS Referring Phys: [phone] DENISE A WOLFE  Sonographer  Comments: No parasternal window, suboptimal apical window and Technically challenging study due to limited acoustic windows. Image acquisition challenging due to patient body habitus. IMPRESSIONS  1. Left ventricular ejection fraction, by estimation, is 55 to 60%. The left ventricle has normal function. The left ventricle has no regional wall motion abnormalities. Left ventricular diastolic parameters are consistent with Grade I diastolic dysfunction (impaired relaxation).  2. Right ventricular systolic function is normal. The right ventricular size is mildly enlarged. There is normal pulmonary artery systolic pressure. The estimated right ventricular systolic pressure is 05.3 mmHg.  3. The mitral valve is normal in structure. No evidence of mitral valve regurgitation. No evidence of mitral stenosis.  4. The aortic valve is tricuspid. There is moderate calcification of the aortic valve. Aortic valve regurgitation is not visualized. No aortic stenosis is present.  5. The inferior vena cava is normal in size with greater than 50% respiratory variability, suggesting right atrial pressure of 3 mmHg. FINDINGS  Left Ventricle: Left ventricular ejection fraction, by estimation, is 55 to 60%. The left ventricle has normal function. The left ventricle has no regional wall motion abnormalities.  The left ventricular internal cavity size was normal in size. There is  no left ventricular hypertrophy. Left ventricular diastolic parameters are consistent with Grade I diastolic dysfunction (impaired relaxation). Right Ventricle: The right ventricular size is mildly enlarged. No increase in right ventricular wall thickness. Right ventricular systolic function is normal. There is normal pulmonary artery systolic pressure. The tricuspid regurgitant velocity is 2.65  m/s, and with an assumed right atrial pressure of 3 mmHg, the estimated right ventricular systolic pressure is 18.8 mmHg. Left Atrium: Left atrial size was normal in size.  Right Atrium: Right atrial size was normal in size. Pericardium: There is no evidence of pericardial effusion. Mitral Valve: The mitral valve is normal in structure. No evidence of mitral valve regurgitation. No evidence of mitral valve stenosis. MV peak gradient, 3.7 mmHg. The mean mitral valve gradient is 1.0 mmHg. Tricuspid Valve: The tricuspid valve is normal in structure. Tricuspid valve regurgitation is trivial. Aortic Valve: The aortic valve is tricuspid. There is moderate calcification of the aortic valve. Aortic valve regurgitation is not visualized. No aortic stenosis is present. Aortic valve mean gradient measures 3.0 mmHg. Aortic valve peak gradient measures 5.9 mmHg. Pulmonic Valve: The pulmonic valve was not well visualized. Pulmonic valve regurgitation is not visualized. Aorta: The aortic root was not well visualized. Venous: The inferior vena cava is normal in size with greater than 50% respiratory variability, suggesting right atrial pressure of 3 mmHg. IAS/Shunts: No atrial level shunt detected by color flow Doppler.   LV Volumes (MOD) LV vol d, MOD A4C: 57.4 ml Diastology LV vol s, MOD A4C: 30.6 ml LV e' medial:    9.06 cm/s LV SV MOD A4C:     57.4 ml LV E/e' medial:  8.8                            LV e' lateral:   8.25 cm/s                            LV E/e' lateral: 9.6  RIGHT VENTRICLE RV Basal diam:  4.30 cm RV Mid diam:    2.60 cm RV S prime:     25.00 cm/s TAPSE (M-mode): 2.3 cm LEFT ATRIUM             Index        RIGHT ATRIUM           Index LA Vol (A2C):   21.6 ml 12.64 ml/m  RA Area:     14.40 cm LA Vol (A4C):   35.4 ml 20.71 ml/m  RA Volume:   35.10 ml  20.54 ml/m LA Biplane Vol: 28.2 ml 16.50 ml/m  AORTIC VALVE AV Vmax:           121.00 cm/s AV Vmean:          82.400 cm/s AV VTI:            0.208 m AV Peak Grad:      5.9 mmHg AV Mean Grad:      3.0 mmHg LVOT Vmax:         89.70 cm/s LVOT Vmean:        60.100 cm/s LVOT VTI:          0.158 m LVOT/AV VTI ratio: 0.76 MITRAL VALVE                TRICUSPID VALVE MV Area (PHT):  3.85 cm    TR Peak grad:   28.1 mmHg MV Peak grad:  3.7 mmHg    TR Vmax:        265.00 cm/s MV Mean grad:  1.0 mmHg MV Vmax:       0.96 m/s    SHUNTS MV Vmean:      53.9 cm/s   Systemic VTI: 0.16 m MV Decel Time: 197 msec MV E velocity: 79.50 cm/s MV A velocity: 87.80 cm/s MV E/A ratio:  0.91 Dalton McleanMD Electronically signed by Franki Monte Signature Date/Time: 01/05/2022/2:33:19 PM    Final       Results/Tests Pending at Time of Discharge:   Discharge Medications:  Allergies as of 01/11/2022   No Known Allergies      Medication List     STOP taking these medications    cephALEXin 500 MG capsule Commonly known as: KEFLEX   haloperidol 1 MG tablet Commonly known as: HALDOL   metroNIDAZOLE 500 MG tablet Commonly known as: FLAGYL       TAKE these medications    acetaminophen 650 MG suppository Commonly known as: TYLENOL Place 650 mg rectally every 4 (four) hours as needed for mild pain or fever.   aspirin EC 81 MG tablet Take 1 tablet (81 mg total) by mouth daily. Swallow whole.   atorvastatin 80 MG tablet Commonly known as: LIPITOR Take 80 mg by mouth daily.   Baclofen 5 MG Tabs Take 5 mg by mouth 2 (two) times daily.   bisacodyl 10 MG suppository Commonly known as: DULCOLAX Place 10 mg rectally daily as needed for constipation.   famotidine 10 MG tablet Commonly known as: PEPCID Take 1 tablet (10 mg total) by mouth daily.   feeding supplement Liqd Take 237 mLs by mouth 3 (three) times daily between meals. What changed:  when to take this reasons to take this   ferrous sulfate 325 (65 FE) MG EC tablet Take 1 tablet (325 mg total) by mouth daily with breakfast.   hyoscyamine 0.125 MG SL tablet Commonly known as: LEVSIN SL Place 0.125 mg under the tongue every 4 (four) hours as needed (excess oral secretions).   LORazepam 1 MG tablet Commonly known as: ATIVAN Take 1 mg by mouth every 4 (four) hours as needed  for anxiety or agitation.   melatonin 5 MG Tabs Take 1 tablet (5 mg total) by mouth at bedtime.   morphine CONCENTRATE 10 mg / 0.5 ml concentrated solution Take 5 mg by mouth every 2 (two) hours as needed (pain or air hunger).   ondansetron 8 MG disintegrating tablet Commonly known as: ZOFRAN-ODT Take 1 tablet (8 mg total) by mouth every 8 (eight) hours as needed for nausea or vomiting.   oxyCODONE-acetaminophen 10-325 MG tablet Commonly known as: PERCOCET Take 1 tablet by mouth every 8 (eight) hours as needed for pain.   promethazine 25 MG tablet Commonly known as: PHENERGAN Take 25 mg by mouth every 4 (four) hours as needed for nausea/vomiting.        Discharge Instructions: Please refer to Patient Instructions section of EMR for full details.  Patient was counseled important signs and symptoms that should prompt return to medical care, changes in medications, dietary instructions, activity restrictions, and follow up appointments.   Follow-Up Appointments:  Follow-up Information     Lilland, Alana, DO. Go on 01/18/2022.   Specialty: Family Medicine Why: At 10:30 am. Please arrive by 10:15 am. This is your hospital follow up appointment at the  family medicine clinic. You will see Dr. Oleh Genin. Contact information: [address] [place] [address] [phone]                 [name], MD 01/11/2022, 3:57 PM PGY-1, [place]

## 2022-01-11 NOTE — Progress Notes (Signed)
Family Medicine Teaching Service Daily Progress Note Intern Pager: [phone]  Patient name: Albert Love record number: [id number] Date of birth: Jun 29, 1941 Age: 81 y.o. Gender: male  Primary Care Provider: Lenoria Chime, MD Consultants: Neurology s/o Code Status: DNR  Pt Overview and Major Events to Date:  5/23 admitted  Assessment and Plan:  Albert Love is a 81 y.o. male who presented with right upper extremity weakness and fall. 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  Has completed Augmentin course on the 28th.  Ostomy bag today is filled with some feculant and purulent fluid. -Morphine 5 mg every 4 hours as needed -Percocet 5 mg twice daily  -Zofran 4 mg every 6 hours as needed -Narcan as needed  Right cerebellar stroke-asymptomatic  radial neuropathy Wrist splint in place right hand.  Right leg improved weakness. 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  Discharged with home health and then resuming hospice later per conversation yesterday  FEN/GI: Regular diet PPx: SCDs Dispo: Home likely today with home health/hospice  Subjective:  Would like something to help with BM and   Objective: Temp:  [98.2 F (36.8 C)-99.9 F (37.7 C)] 98.2 F (36.8 C) (05/30 0336) Pulse Rate:  [95-111] 95 (05/30 0336) Resp:  [18-20] 18 (05/29 2334) BP:  (119-131)/(64-84) 127/69 (05/30 0336) SpO2:  [97 %-100 %] 100 % (05/30 0336) Physical Exam: General: NAD, alert and responsive Cardiovascular: RRR no m/r/g Respiratory: CTAB no w/r/c Abdomen: nontender, RLQ wound with ostomy in place Extremities: No LE edema  Laboratory: Recent Labs  Lab 01/08/22 0619 01/09/22 0207 01/10/22 0449  WBC 19.8* 18.1* 15.4*  HGB 8.1* 7.8* 7.4*  HCT 27.1* 25.9* 24.8*  PLT 453* 479* 500*   Recent Labs  Lab 01/04/22 1141 01/05/22 0347 01/06/22 0530 01/07/22 0357 01/08/22 0619 01/10/22 0449  NA 139 139 137 136 133* 134*  K 3.8 3.8 3.5 3.7 4.5 3.5  CL 103 105 107 104 102 100  CO2 '28 24 25 25 23 26  '$ BUN 10 10 7* 6* 9 8  CREATININE 0.80 0.69 0.58* 0.58* 0.67 0.68  CALCIUM 8.7* 8.5* 7.7* 8.0* 8.2* 7.8*  PROT 6.0* 5.6* 4.9*  --   --   --   BILITOT 0.5 0.5 0.7  --   --   --   ALKPHOS 70 69 57  --   --   --   ALT '10 11 10  '$ --   --   --   AST '16 21 15  '$ --   --   --   GLUCOSE 109* 121* 116* 109* 93 137*    Imaging/Diagnostic Tests: No results found.   [name], MD 01/11/2022, 6:42 AM PGY-1, [facility] Intern pager: [phone], text pages welcome

## 2022-01-11 NOTE — Progress Notes (Signed)
FPTS Brief Note Reviewed patient's vitals, recent notes.  Vitals:   01/11/22 1551 01/11/22 2006  BP: 117/68 133/64  Pulse: 99 97  Resp: 18 17  Temp: 98.1 F (36.7 C) 98.4 F (36.9 C)  SpO2: 99% 100%   At this time, no change in plan from day progress note.  Shary Key, DO Page [phone] with questions about this patient.

## 2022-01-11 NOTE — Telephone Encounter (Signed)
[facility] form to be completed by pcp.  Daughter dropped off last time patient seen was 11/10/21.  Any questions call daughter. [phone]

## 2022-01-11 NOTE — Progress Notes (Signed)
Occupational Therapy Treatment Patient Details Name: Albert Love MRN: [id number] DOB: 07-15-1941 Today's Date: 01/11/2022   History of present illness 81 y/o male presenting from home with complaint of R UE weakness.  MRI showed a small acute to subacute infarct in the inferior right cerebellum.  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   OT comments  Pt seen for RUE activity and progression of function. Pt able to use RUE to hold and eat hamburger with radial nerve palsy splint donned. Pt demonstrating increased isolated digit extension, still presents with difficulty lifting 3rd digit in isolation. Pt able to complete functional card game task, holds cards in bil hands, can pick up cards one at a time with R hand with radial nerve palsy splint donned, and completes functional clothespin task to improve pincer grasp. Pt presenting with impairments listed below, will follow acutely. Continue to recommend OP OT at d/c.   Recommendations for follow up therapy are one component of a multi-disciplinary discharge planning process, led by the attending physician.  Recommendations may be updated based on patient status, additional functional criteria and insurance authorization.    Follow Up Recommendations  Outpatient OT    Assistance Recommended at Discharge Intermittent Supervision/Assistance  Patient can return home with the following  A little help with walking and/or transfers;A little help with bathing/dressing/bathroom;Assist for transportation;Assistance with cooking/housework;Help with stairs or ramp for entrance   Equipment Recommendations  None recommended by OT    Recommendations for Other Services PT consult    Precautions / Restrictions Precautions Precautions: Fall Required Braces or Orthoses:  Splint/Cast Splint/Cast: R wrist cock-up and R radial nerve palsy splint Restrictions Weight Bearing Restrictions: No       Mobility Bed Mobility                    Transfers                         Balance Overall balance assessment: Needs assistance Sitting-balance support: No upper extremity supported, Feet supported Sitting balance-Leahy Scale: Fair                                     ADL either performed or assessed with clinical judgement   ADL Overall ADL's : Needs assistance/impaired Eating/Feeding: Minimal assistance Eating/Feeding Details (indicate cue type and reason): able to hold burger with radial nerve palsy splint donned, requires food cut up into pieces                                        Extremity/Trunk Assessment Upper Extremity Assessment Upper Extremity Assessment: RUE deficits/detail RUE Deficits / Details: With all splints off he has full wrist extension against gravity, can do composite flexion with wrist at neutral but not composite flexion with wrist neutral (flexes wrist), can oppose thumb to all digits, can adduct/abduct all digits, can pick up small objects, holds marker in hand throughout session while completing word search RUE Coordination: decreased gross motor;decreased fine motor   Lower Extremity Assessment Lower Extremity Assessment: Defer to PT evaluation        Vision  Vision Assessment?: Vision impaired- to be further tested in functional context Additional Comments: decreased R eye vision   Perception Perception Perception: Within Functional Limits   Praxis Praxis Praxis: Not tested    Cognition Arousal/Alertness: Awake/alert Behavior During Therapy: WFL for tasks assessed/performed Overall Cognitive Status: Within Functional Limits for tasks assessed                                          Exercises Other Exercises Other Exercises: pincer grasp  using clothespins Other Exercises: isolated digit extension in pronation    Shoulder Instructions       General Comments pt with abdominal pain today limiting mobility, BP 128/78    Pertinent Vitals/ Pain       Pain Assessment Pain Assessment: No/denies pain  Home Living                                          Prior Functioning/Environment              Frequency  Min 3X/week        Progress Toward Goals  OT Goals(current goals can now be found in the care plan section)  Progress towards OT goals: Progressing toward goals  Acute Rehab OT Goals Patient Stated Goal: use RUE more OT Goal Formulation: With patient Time For Goal Achievement: 01/19/22 Potential to Achieve Goals: Good ADL Goals Pt Will Perform Lower Body Dressing: with supervision;sit to/from stand;sitting/lateral leans Pt Will Transfer to Toilet: with supervision;ambulating;regular height toilet Pt Will Perform Tub/Shower Transfer: with supervision;shower seat;ambulating;Shower transfer;Tub transfer Additional ADL Goal #1: Pt will be able to demonstrate donning/doffing of splint and verbalize wear schedule in order to support RUE during functional tasks  Plan Discharge plan remains appropriate    Co-evaluation                 AM-PAC OT "6 Clicks" Daily Activity     Outcome Measure   Help from another person eating meals?: A Little Help from another person taking care of personal grooming?: A Little Help from another person toileting, which includes using toliet, bedpan, or urinal?: A Little Help from another person bathing (including washing, rinsing, drying)?: A Lot Help from another person to put on and taking off regular upper body clothing?: A Little Help from another person to put on and taking off regular lower body clothing?: A Lot 6 Click Score: 16    End of Session    OT Visit Diagnosis: Unsteadiness on feet (R26.81);Muscle weakness (generalized) (M62.81)    Activity Tolerance Patient tolerated treatment well   Patient Left in bed;with call bell/phone within reach;with bed alarm set   Nurse Communication Mobility status        Time: 8315-1761 OT Time Calculation (min): 22 min  Charges: OT General Charges $OT Visit: 1 Visit OT Treatments $Therapeutic Activity: 8-22 mins  Lynnda Child, OTD, OTR/L Acute Rehab [phone] [place] 01/11/2022, 2:44 PM

## 2022-01-11 NOTE — TOC Transition Note (Signed)
Transition of Care [facility]) - CM/SW Discharge Note   Patient Details  Name: Amilcar Reever MRN: [id number] Date of Birth: [birth year]  [facility] CM/SW Contact:  Geralynn Ochs, LCSW Phone Number: 01/11/2022, 3:52 PM   Clinical Narrative:   CSW met with patient to discuss going home with hospice and concerns he has, and he asked for an external catheter to have home with him. CSW spoke with hospice liaison and they are able to bring catheter supplies to the patient. Per MD, family to provide transportation back home. No other TOC needs at this time.    Final next level of care: Home w Hospice Care Barriers to Discharge: Barriers Resolved   Patient Goals and CMS Choice Patient states their goals for this hospitalization and ongoing recovery are:: to get back home and stay home CMS Medicare.gov Compare Post Acute Care list provided to:: Patient Choice offered to / list presented to : Patient  Discharge Placement                Patient to be transferred to facility by: Family Name of family member notified: [name] Patient and family notified of of transfer: 01/11/22  Discharge Plan and Services     Post Acute Care Choice: NA                               Social Determinants of Health (SDOH) Interventions     Readmission Risk Interventions     View : No data to display.

## 2022-01-12 ENCOUNTER — Telehealth: Payer: Self-pay

## 2022-01-12 DIAGNOSIS — K632 Fistula of intestine: Secondary | ICD-10-CM | POA: Diagnosis not present

## 2022-01-12 NOTE — Progress Notes (Signed)
[employer] appeal documents completed and uploaded to kepro site. MD notified.

## 2022-01-12 NOTE — TOC Progression Note (Signed)
Transition of Care [facility]) - Progression Note    Patient Details  Name: Albert Love MRN: [id number] Date of Birth: [birth year]  Transition of Care [facility]) CM/SW Contact  [name] Phone Number: 01/12/2022, 2:47 PM  Clinical Narrative:      .[employer]KE[employer]            Social Determinants of Health (SDOH) Interventions    Readmission Risk Interventions     View : No data to display.

## 2022-01-12 NOTE — Progress Notes (Addendum)
Late filing:  Originally spoke with son that patient was nearing discharge and let him know hospital bed delivered and DME there. Son agreeable to discharge and let him know I would call him back once we have signed discharge orders  Spoke to son [name] again and he asked if I had any information about an appeal for Mr. Desha discharge. I let him know I was not aware of this from him and touched base with social worker who also was unaware of any paperwork. Son said he would come to hospital to pick patient up.  Son [name] arrived at hospital at bedside and I got paged from nursing saying he is appealing Mr. Eyerly's discharge. When asked why he says "he is too weak to eat." Discussed PT has been working with Mr. Heinlen and improving. [name] was upset and says "he has talked to multiple nurses and to put me on the phone with the social worker." It was 5:30 at the time and Nature conservation officer and spoke with charge nurse who informed me that social work and case management are gone for the day. Discussed with Dr. Gwendlyn Deutscher our attending and decision to rescind discharge order placed and let charge nurse know that insurance may or may not pay for this night and that I will get in contact with social work and case management in the morning.

## 2022-01-12 NOTE — Progress Notes (Signed)
Occupational Therapy Treatment Patient Details Name: Albert Love MRN: [id number] DOB: [birth year] Today's Date: 01/12/2022   History of present illness 81 y/o male presenting from home with complaint of R UE weakness.  MRI showed a small acute to subacute infarct in the inferior right cerebellum.  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   OT comments  Pt progressing towards goals, able to complete simulated grooming and functional tasks this session. Pt improving with ability to extend wrist against gravity, difficulty keeping wrist neutral with grasp/digit flexion. Pt has weak digit extension in pronation and weak cylindrical grasp with cock up splint donned. Pt reports he has been doing his exercises and using RUE more with functional activities. Pt presenting with impairments listed below, will follow acutely. Continue to recommend OP OT at d/c.   Recommendations for follow up therapy are one component of a multi-disciplinary discharge planning process, led by the attending physician.  Recommendations may be updated based on patient status, additional functional criteria and insurance authorization.    Follow Up Recommendations  Outpatient OT    Assistance Recommended at Discharge Intermittent Supervision/Assistance  Patient can return home with the following  A little help with walking and/or transfers;A little help with bathing/dressing/bathroom;Assist for transportation;Assistance with cooking/housework;Help with stairs or ramp for entrance   Equipment Recommendations  None recommended by OT    Recommendations for Other Services PT consult    Precautions / Restrictions Precautions Precautions: Fall Precaution Comments: colostomy Required Braces or Orthoses: Splint/Cast Splint/Cast: R wrist cock-up and R  radial nerve palsy splint Restrictions Weight Bearing Restrictions: No       Mobility Bed Mobility               General bed mobility comments: up in chair upon arrival    Transfers                   General transfer comment: deferred, pt just finished working with PT wanting to remain in chair     Balance                                           ADL either performed or assessed with clinical judgement   ADL Overall ADL's : Needs assistance/impaired Eating/Feeding: Minimal assistance;Sitting   Grooming: Supervision/safety Grooming Details (indicate cue type and reason): simulated gathering supplies                                    Extremity/Trunk Assessment Upper Extremity Assessment Upper Extremity Assessment: RUE deficits/detail RUE Deficits / Details: improving with ability to extend wrist agianst gravity, difficulty keeping wrist neutral during composite flexion/extension, can oppose thumb to all digits, can abduct/adduct all digits, weak cylindrical grasp RUE Coordination: decreased fine motor;decreased gross motor   Lower Extremity Assessment Lower Extremity Assessment: Defer to PT evaluation        Vision   Vision Assessment?: Vision impaired- to be further tested in functional context Additional Comments: decreased R eye vision   Perception Perception Perception: Within Functional Limits   Praxis Praxis Praxis: Not tested    Cognition Arousal/Alertness: Awake/alert Behavior During Therapy: [facility] for  tasks assessed/performed Overall Cognitive Status: Within Functional Limits for tasks assessed                                          Exercises Hand Exercises Wrist Flexion: AROM, Right, 5 reps, Seated Wrist Extension: AROM, 5 reps, Right, Seated Digit Composite Flexion: AROM, Right, 5 reps, Seated Composite Extension: AROM, 5 reps, Right, Seated Thumb Abduction: AROM, Right, 5 reps,  Seated Opposition: AROM, Right, 5 reps, Seated Other Exercises Other Exercises: pincer grasp picking up small objects Other Exercises: cylindrical grasp picking up objects Other Exercises: disc grasp picking up small objects Other Exercises: isolated digit extension    Shoulder Instructions       General Comments VSS on RA    Pertinent Vitals/ Pain       Pain Assessment Pain Assessment: Faces Pain Score: 4  Faces Pain Scale: Hurts little more Pain Location: abdominal pain Pain Descriptors / Indicators: Cramping Pain Intervention(s): Limited activity within patient's tolerance, Monitored during session  Home Living                                          Prior Functioning/Environment              Frequency  Min 3X/week        Progress Toward Goals  OT Goals(current goals can now be found in the care plan section)  Progress towards OT goals: Progressing toward goals  Acute Rehab OT Goals Patient Stated Goal: use RUE more OT Goal Formulation: With patient Time For Goal Achievement: 01/19/22 Potential to Achieve Goals: Good ADL Goals Pt Will Perform Lower Body Dressing: with supervision;sit to/from stand;sitting/lateral leans Pt Will Transfer to Toilet: with supervision;ambulating;regular height toilet Pt Will Perform Tub/Shower Transfer: with supervision;shower seat;ambulating;Shower transfer;Tub transfer Additional ADL Goal #1: Pt will be able to demonstrate donning/doffing of splint and verbalize wear schedule in order to support RUE during functional tasks  Plan Discharge plan remains appropriate    Co-evaluation                 AM-PAC OT "6 Clicks" Daily Activity     Outcome Measure   Help from another person eating meals?: A Little Help from another person taking care of personal grooming?: A Little Help from another person toileting, which includes using toliet, bedpan, or urinal?: A Little Help from another person bathing  (including washing, rinsing, drying)?: A Lot Help from another person to put on and taking off regular upper body clothing?: A Little Help from another person to put on and taking off regular lower body clothing?: A Lot 6 Click Score: 16    End of Session    OT Visit Diagnosis: Unsteadiness on feet (R26.81);Muscle weakness (generalized) (M62.81)   Activity Tolerance Patient tolerated treatment well   Patient Left in chair;with call bell/phone within reach;with chair alarm set   Nurse Communication Mobility status        Time: 8850-2774 OT Time Calculation (min): 30 min  Charges: OT General Charges $OT Visit: 1 Visit OT Treatments $Therapeutic Activity: 8-22 mins $Therapeutic Exercise: 8-22 mins  Lynnda Child, OTD, OTR/L Acute Rehab [phone]   Kaylyn Lim 01/12/2022, 1:08 PM

## 2022-01-12 NOTE — Progress Notes (Signed)
Family Medicine Teaching Service Daily Progress Note Intern Pager: [phone]  Patient name: Albert Love record number: [id number] Date of birth: July 14, 1941 Age: 81 y.o. Gender: male  Primary Care Provider: Gladys Damme, MD Consultants: Neurology s/o Code Status: DNR  Pt Overview and Major Events to Date:  5/23 admitted  Assessment and Plan:  Albert Love is a 81 y.o. male who presented with right upper extremity weakness and fall. 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 S/p augmentin course. Ostomy bag today is half full with feculant fluid -Morphine 5 mg q5h prn -percocet 5 mg BID -zofran 4 mg q6h prn -narcan prn  R cerebellar stroke - asymptomatic  radial neuropathy Wrist splint in place of right hand. Right leg improved -atorvastatin 80 mg  -ASA  Microcytic anemia 2/2 to colon cancer Hemoglobin 7.4 stable  -monitor on CBC  Constipation -miralax  GOC  Dispo Was discharged yesterday however son wanted to verbally appeal discharge. I have reached out to social work for help with this disposition.   FEN/GI: Regular diet  PPx: SCDs Dispo: Home likely today, will touch base with social worker as well about conversation with son yesterday  Subjective:  Would like to speak with social work and PT today  Objective: Temp:  [97.5 F (36.4 C)-99.7 F (37.6 C)] 97.5 F (36.4 C) (05/31 0421) Pulse Rate:  [91-105] 93 (05/31 0421) Resp:  [16-18] 17 (05/30 2006) BP: (117-136)/(64-71) 123/65 (05/31 0421) SpO2:  [97 %-100 %] 100 % (05/31  0421) Weight:  [61.9 kg] 61.9 kg (05/31 0500) Physical Exam: General: NAD, laying in bed comfortably, alert and responsive Cardiovascular: RRR no m/r/g Respiratory: CTAB no w/r/c Abdomen: Nontender, soft, PEG in place Extremities: No LE edema, Right wrist splint in place, able to lit both legs up against gravity, dorsiflexing and plantar flexing both  Laboratory: Recent Labs  Lab 01/09/22 0207 01/10/22 0449 01/11/22 0609  WBC 18.1* 15.4* 16.0*  HGB 7.8* 7.4* 8.0*  HCT 25.9* 24.8* 26.4*  PLT 479* 500* 519*   Recent Labs  Lab 01/06/22 0530 01/07/22 0357 01/08/22 0619 01/10/22 0449  NA 137 136 133* 134*  K 3.5 3.7 4.5 3.5  CL 107 104 102 100  CO2 '25 25 23 26  '$ BUN 7* 6* 9 8  CREATININE 0.58* 0.58* 0.67 0.68  CALCIUM 7.7* 8.0* 8.2* 7.8*  PROT 4.9*  --   --   --   BILITOT 0.7  --   --   --   ALKPHOS 57  --   --   --   ALT 10  --   --   --   AST 15  --   --   --   GLUCOSE 116* 109* 93 137*    Imaging/Diagnostic Tests: No results found.   [name], MD 01/12/2022, 7:02 AM PGY-1, [facility] Intern pager: [phone], text pages welcome

## 2022-01-12 NOTE — Telephone Encounter (Signed)
Transition Care Management Unsuccessful Follow-up Telephone Call  Date of discharge and from where:  [facility]  Attempts:  1st Attempt  Reason for unsuccessful TCM follow-up call:  No answer/busy Unaable to leave a voicemail not set up.  Lazaro Arms RN, BSN, [facility] Care Management Coordinator Transition of Care  Phone: [phone]

## 2022-01-12 NOTE — Care Management Important Message (Signed)
Important Message  Patient Details  Name: Albert Love MRN: [id number] Date of Birth: October 30, 1940   Medicare Important Message Given:  Yes     [name] 01/12/2022, 2:23 PM

## 2022-01-12 NOTE — Progress Notes (Signed)
Physical Therapy Treatment Patient Details Name: Albert Love MRN: [id number] DOB: 1941-06-28 Today's Date: 01/12/2022   History of Present Illness 81 y/o male presenting from home with complaint of R UE weakness.  MRI showed a small acute to subacute infarct in the inferior right cerebellum.  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    PT Comments    Pt able to ambulate about 33' today with RW. Pt feeling much better today. Discussed car transfers and not to pull/push on car door. Acute PT to cont to follow.   Recommendations for follow up therapy are one component of a multi-disciplinary discharge planning process, led by the attending physician.  Recommendations may be updated based on patient status, additional functional criteria and insurance authorization.  Follow Up Recommendations  Home health PT     Assistance Recommended at Discharge Intermittent Supervision/Assistance  Patient can return home with the following A little help with walking and/or transfers;A little help with bathing/dressing/bathroom   Equipment Recommendations  Rolling walker (2 wheels)    Recommendations for Other Services       Precautions / Restrictions Precautions Precautions: Fall Precaution Comments: colostomy Required Braces or Orthoses: Splint/Cast Splint/Cast: R wrist cock-up and R radial nerve palsy splint Restrictions Weight Bearing Restrictions: No     Mobility  Bed Mobility Overal bed mobility: Needs Assistance Bed Mobility: Supine to Sit     Supine to sit: Min assist     General bed mobility comments: minA for LE management and to aide with trunk elevation.    Transfers Overall transfer level: Needs assistance Equipment used: Rolling walker (2 wheels) Transfers: Sit to/from Stand Sit to Stand:  Min assist           General transfer comment: minA to power up and steady, increased time, pt with good hand placement    Ambulation/Gait Ambulation/Gait assistance: Min assist Gait Distance (Feet): 55 Feet Assistive device: Rolling walker (2 wheels) Gait Pattern/deviations: Trunk flexed, Step-to pattern Gait velocity: dec Gait velocity interpretation: <1.31 ft/sec, indicative of household ambulator   General Gait Details: verbal cues to maintain upright posture and look forward   Stairs             Wheelchair Mobility    Modified Rankin (Stroke Patients Only) Modified Rankin (Stroke Patients Only) Pre-Morbid Rankin Score: No significant disability Modified Rankin: Moderately severe disability     Balance Overall balance assessment: Needs assistance Sitting-balance support: No upper extremity supported, Feet supported Sitting balance-Leahy Scale: Fair Sitting balance - Comments: pt with L lateral lean in sitting when attempting to don splint   Standing balance support: No upper extremity supported Standing balance-Leahy Scale: Poor Standing balance comment: limited by abdominal pain today requiring more assist                            Cognition Arousal/Alertness: Awake/alert Behavior During Therapy: WFL for tasks assessed/performed Overall Cognitive Status: Within Functional Limits for tasks assessed                                          Exercises      General Comments General comments (skin integrity, edema, etc.): VSS, colostomy  intact      Pertinent Vitals/Pain Pain Assessment Pain Assessment: 0-10 Pain Score: 4  Pain Location: abdominal pain Pain Descriptors / Indicators: Cramping    Home Living                          Prior Function            PT Goals (current goals can now be found in the care plan section) Acute Rehab PT Goals PT Goal Formulation: With patient Time For Goal Achievement:  01/20/22 Potential to Achieve Goals: Good Progress towards PT goals: Progressing toward goals    Frequency    Min 4X/week      PT Plan Current plan remains appropriate    Co-evaluation              AM-PAC PT "6 Clicks" Mobility   Outcome Measure  Help needed turning from your back to your side while in a flat bed without using bedrails?: A Little Help needed moving from lying on your back to sitting on the side of a flat bed without using bedrails?: A Little Help needed moving to and from a bed to a chair (including a wheelchair)?: A Little Help needed standing up from a chair using your arms (e.g., wheelchair or bedside chair)?: A Little Help needed to walk in hospital room?: A Lot Help needed climbing 3-5 steps with a railing? : A Lot 6 Click Score: 16    End of Session Equipment Utilized During Treatment: Gait belt Activity Tolerance: Patient tolerated treatment well Patient left: with call bell/phone within reach;in chair;with chair alarm set Nurse Communication: Mobility status PT Visit Diagnosis: Unsteadiness on feet (R26.81);Muscle weakness (generalized) (M62.81)     Time: 1062-6948 PT Time Calculation (min) (ACUTE ONLY): 19 min  Charges:  $Gait Training: 8-22 mins                     Kittie Plater, PT, DPT Acute Rehabilitation Services Secure chat preferred Office #: [phone]    Berline Lopes 01/12/2022, 12:23 PM

## 2022-01-13 ENCOUNTER — Ambulatory Visit: Payer: Medicare Other | Admitting: Student

## 2022-01-13 DIAGNOSIS — I639 Cerebral infarction, unspecified: Secondary | ICD-10-CM

## 2022-01-13 LAB — BASIC METABOLIC PANEL
Anion gap: 7 (ref 5–15)
BUN: 8 mg/dL (ref 8–23)
CO2: 27 mmol/L (ref 22–32)
Calcium: 8 mg/dL — ABNORMAL LOW (ref 8.9–10.3)
Chloride: 99 mmol/L (ref 98–111)
Creatinine, Ser: 0.63 mg/dL (ref 0.61–1.24)
GFR, Estimated: >60 mL/min (ref >60)
Glucose, Bld: 110 mg/dL — ABNORMAL HIGH (ref 70–99)
Potassium: 3.5 mmol/L (ref 3.5–5.1)
Sodium: 133 mmol/L — ABNORMAL LOW (ref 135–145)

## 2022-01-13 NOTE — Plan of Care (Signed)
Pt is alert oriented x 4. Pt is RA. Pt received scheduled pain medication and states it is effective. Pt has been turned and repositioned. Pt c/o weakness to right side, unable to move right leg due to pain. Pt has colostomy bag that was rinsed and emptied. No distress noted. Call button within reach.    Problem: Education: Goal: Knowledge of disease or condition will improve Outcome: Progressing Goal: Knowledge of secondary prevention will improve (SELECT ALL) Outcome: Progressing Goal: Knowledge of patient specific risk factors will improve (INDIVIDUALIZE FOR PATIENT) Outcome: Progressing   Problem: Coping: Goal: Will verbalize positive feelings about self Outcome: Progressing   Problem: Health Behavior/Discharge Planning: Goal: Ability to manage health-related needs will improve Outcome: Progressing   Problem: Self-Care: Goal: Ability to participate in self-care as condition permits will improve Outcome: Progressing Goal: Verbalization of feelings and concerns over difficulty with self-care will improve Outcome: Progressing   Problem: Nutrition: Goal: Risk of aspiration will decrease Outcome: Progressing   Problem: Education: Goal: Knowledge of General Education information will improve Description: Including pain rating scale, medication(s)/side effects and non-pharmacologic comfort measures Outcome: Progressing   Problem: Health Behavior/Discharge Planning: Goal: Ability to manage health-related needs will improve Outcome: Progressing   Problem: Clinical Measurements: Goal: Ability to maintain clinical measurements within normal limits will improve Outcome: Progressing Goal: Will remain free from infection Outcome: Progressing Goal: Diagnostic test results will improve Outcome: Progressing Goal: Respiratory complications will improve Outcome: Progressing Goal: Cardiovascular complication will be avoided Outcome: Progressing   Problem: Activity: Goal: Risk for  activity intolerance will decrease Outcome: Progressing   Problem: Nutrition: Goal: Adequate nutrition will be maintained Outcome: Progressing   Problem: Coping: Goal: Level of anxiety will decrease Outcome: Progressing   Problem: Elimination: Goal: Will not experience complications related to bowel motility Outcome: Progressing Goal: Will not experience complications related to urinary retention Outcome: Progressing   Problem: Pain Managment: Goal: General experience of comfort will improve Outcome: Progressing   Problem: Safety: Goal: Ability to remain free from injury will improve Outcome: Progressing   Problem: Skin Integrity: Goal: Risk for impaired skin integrity will decrease Outcome: Progressing

## 2022-01-13 NOTE — Progress Notes (Addendum)
Family Medicine Teaching Service Daily Progress Note Intern Pager: [phone]  Patient name: [name] record number: [id number] Date of birth: 09/12/1940 Age: 81 y.o. Gender: male  Primary Care Provider: Gladys Damme, MD Consultants: neurology Code Status: DNR  Pt Overview and Major Events to Date:  5/23 admitted  Assessment and Plan: Albert Love is a 81 y.o. male who presented with right upper extremity weakness and fall. 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 Pt is on all oral medications which can be taken at home and he has hospice services at home.  Sepsis 2/2 RLQ wound infection, resolved VSS, s/p Augmentin, no further workup/medical management needed. Can continue oral pain meds at home. 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 May benefit from out pt nerve conduction study. Wrist -splint for R hand   Microcytic anemia 2/2 to colon cancer -hgb has been stable, no need to continue checking.  Constipation -miralax daily   FEN/GI: Regular diet PPx: SCDs Dispo: Home pending discharge appeal status  Subjective:  Patient states his right arm is feeling much improved today, has been doing exercises wearing his wrist splint.  He admits to periumbilical abdominal pain this morning at 6/10.  States he gets pains like this weekly, he takes pain medications  and the pain goes away.  He denies nausea, does not have an appetite but this is normal for him.  Objective: Temp:  [98.4 F (36.9 C)-99.5 F (37.5 C)] 99.5 F (37.5 C) (06/01 0356) Pulse Rate:  [95-101] 100 (06/01 0356) Resp:  [16-22] 22 (06/01 0356) BP: (111-130)/(62-75) 130/62 (06/01 0356) SpO2:  [97 %-100 %] 97 % (06/01 0356) Physical Exam: General: 80 year old male, NAD Cardiovascular: RRR, normal S1/S2 Respiratory: CTAB, normal effort Abdomen: Tenderness to palpation around periumbilical area, no guarding, soft, nondistended Extremities: Right wrist splint in place  Laboratory: Recent Labs  Lab 01/09/22 0207 01/10/22 0449 01/11/22 0609  WBC 18.1* 15.4* 16.0*  HGB 7.8* 7.4* 8.0*  HCT 25.9* 24.8* 26.4*  PLT 479* 500* 519*   Recent Labs  Lab 01/08/22 0619 01/10/22 0449 01/13/22 0330  NA 133* 134* 133*  K 4.5 3.5 3.5  CL 102 100 99  CO2 '23 26 27  '$ BUN '9 8 8  '$ CREATININE 0.67 0.68 0.63  CALCIUM 8.2* 7.8* 8.0*  GLUCOSE 93 137* 110Precious Gilding, DO 01/13/2022, 7:08 AM PGY-1, [place] Intern pager: [phone], text pages welcome

## 2022-01-13 NOTE — Progress Notes (Signed)
FPTS Brief Note Reviewed patient's vitals, recent notes.  Vitals:   01/13/22 1219 01/13/22 1537  BP: 116/60 116/67  Pulse: 97 90  Resp: 16 17  Temp: 98.7 F (37.1 C) 98.7 F (37.1 C)  SpO2: 100% 100%   At this time, no change in plan from day progress note.  Gladys Damme, MD Page [phone] with questions about this patient.

## 2022-01-13 NOTE — Progress Notes (Signed)
FPTS Brief Note Reviewed patient's vitals, recent notes.  Vitals:   01/12/22 2044 01/13/22 0027  BP: 119/66 111/63  Pulse: 100 96  Resp: 16 20  Temp: 98.8 F (37.1 C) 98.6 F (37 C)  SpO2: 100% 100%   At this time, no change in plan from day progress note.  Shary Key, DO Page [phone] with questions about this patient.

## 2022-01-13 NOTE — Plan of Care (Signed)
Max assist with ADLs

## 2022-01-14 DIAGNOSIS — I639 Cerebral infarction, unspecified: Secondary | ICD-10-CM | POA: Diagnosis not present

## 2022-01-14 NOTE — Progress Notes (Signed)
Pt c/o of burning around pouch in RLQ.  7/10.  Notified MD.  Will continue to monitor.

## 2022-01-14 NOTE — Progress Notes (Signed)
Family Medicine Teaching Service Daily Progress Note Intern Pager: [phone]  Patient name: Albert Love record number: [id number] Date of birth: 05/07/1941 Age: 81 y.o. Gender: male  Primary Care Provider: Gladys Damme, MD Consultants: Neurology Code Status: DNR  Pt Overview and Major Events to Date:  5/23-admitted  Assessment and Plan: Albert Love is a 81 y.o. male who presented with right upper extremity weakness and fall. PMH of nonoperable malignant tumor of the colon, HTN, microcytic anemia, osteopenia, HLD, central retinal artery occlusion of right eye, inguinal hernia, perforated gastric ulcer and subsequent pneumoperitoneum s/p omental Graham patch in 2021, intra-abdominal abscess s/p JP drain placement to cecal malignancy 06/07/2021 which has since been removed 08/2021  Goals of care Discharged on 5/30 but son is appealing discharge.  Appeal was sent in 2 days ago, they have 72 hours to make a decision.  Patient is on all oral medications can be taken at home and has hospice services at home.  Sepsis 2/2R LQ wound infection, resolved VSS, s/p Augmentin, no further work-up/medical management needed -Morphine 5 mg every 5 hours as needed -Percocet 5/325 mg twice daily -Zofran 4 mg every 6 hours. 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  May benefit from outpatient nerve conduction study -Resplint for right hand  Microcytic anemia 2/2 to colon cancer -Hemoglobin has been stable and has not been trended  Constipation -MiraLAX daily  FEN/GI: Regular diet PPx: SCDs Dispo: Home pending discharge appeal status  Subjective:  Abdominal pain he had yesterday has resolved, right arm weakness continues to improve, is eating breakfast with his right arm.  Objective: Temp:  [98.1 F (36.7 C)-98.8 F (37.1 C)] 98.1  F (36.7 C) (06/02 0402) Pulse Rate:  [76-99] 76 (06/02 0402) Resp:  [16-18] 18 (06/02 0402) BP: (110-116)/(60-75) 110/75 (06/02 0402) SpO2:  [99 %-100 %] 100 % (06/02 0402) Physical Exam: General: 81 year old male, NAD, pleasant to speak with, eating breakfast Cardiovascular: RRR, normal S1/S2 Respiratory: CTA B, normal effort Abdomen: Soft, nondistended, nontender, ostomy in place containing very liquidy brown fluid. Extremities: Right wrist splint in place  Laboratory: Recent Labs  Lab 01/09/22 0207 01/10/22 0449 01/11/22 0609  WBC 18.1* 15.4* 16.0*  HGB 7.8* 7.4* 8.0*  HCT 25.9* 24.8* 26.4*  PLT 479* 500* 519*   Recent Labs  Lab 01/08/22 0619 01/10/22 0449 01/13/22 0330  NA 133* 134* 133*  K 4.5 3.5 3.5  CL 102 100 99  CO2 '23 26 27  '$ BUN '9 8 8  '$ CREATININE 0.67 0.68 0.63  CALCIUM 8.2* 7.8* 8.0*  GLUCOSE 93 137* 110Precious Gilding, DO 01/14/2022, 8:45 AM PGY-1, [place] Intern pager: [phone], text pages welcome

## 2022-01-14 NOTE — TOC Progression Note (Addendum)
Transition of Care [facility]) - Progression Note    Patient Details  Name: Albert Love MRN: [id number] Date of Birth: [birth year]  [facility] [address] [address] CM/SW Contact  Loletha Grayer Beverely Pace, RN Phone Number: 01/14/2022, 2:55 PM  Clinical Narrative:   Case Manager called Albert Love [phone] to inquire about appeal filed by patient's son. CM was informed that appeal was reviewed by MD on yesterday, 01/13/22 at 4:08 pm and discharge was upheld. Case Manager notified Education officer, museum and attending. TOC team will continue to follow.   1545: Patient's daughter, Albert Love [phone], called and provided her address, [address] [place], [place] [address]. Patient has been living with her for awhile. She is requesting [facility] transport for discharge on Saturday, 01/15/22.   Expected Discharge Plan: Home w Hospice Care Barriers to Discharge: Barriers Resolved  Expected Discharge Plan and Services Expected Discharge Plan: [place] Acute Care Choice: NA Living arrangements for the past 2 months: Single Family Home Expected Discharge Date: 01/12/22                                     Social Determinants of Health (SDOH) Interventions    Readmission Risk Interventions     View : No data to display.

## 2022-01-14 NOTE — Telephone Encounter (Signed)
Form placed up front for pick up.   Copy made for batch scanning.   Attempted to call patient and son, however no answer.

## 2022-01-14 NOTE — Progress Notes (Signed)
Nutrition Follow-up  DOCUMENTATION CODES:  Severe malnutrition in context of chronic illness  INTERVENTION:  Continue current nutrition plan: Encourage PO intake Continue regular diet Continue Ensure Enlive po TID, each supplement provides 350 kcal and 20 grams of protein. Continue Magic cup TID with meals, each supplement provides 290 kcal and 9 grams of protein  NUTRITION DIAGNOSIS:  Severe Malnutrition (in the context of chronic illness (cancer)) related to  (inadequate energy .intake) as evidenced by severe fat depletion, severe muscle depletion. - remains applicable  GOAL:  Patient will meet greater than or equal to 90% of their needs - progressing, diet and supplements in place  MONITOR:  PO intake, Supplement acceptance  REASON FOR ASSESSMENT:  Consult Assessment of nutrition requirement/status  ASSESSMENT:  Pt with hx previous CVA, HLD, GERD, and of colon cancer under hospice care at home presented to ED after an increase in right sided weakness. 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  Pt medical stable for discharge, but currently remains at hospital due to family appealing dc. Intake stable and pt is routinely accepting nutrition supplements.   Will continue current plan of care.    Average Meal Intake: 5/23-6/2: 75% intake x 4 recorded meals  Nutritionally Relevant Medications: Scheduled Meds:  atorvastatin  80 mg Oral Daily   baclofen  5 mg Oral BID   famotidine  20 mg Oral Daily   Ensure Enlive  237 mL Oral TID BM   polyethylene glycol  17 g Oral Daily   PRN Meds: ondansetron  Labs Reviewed: Sodium 133 HgbA1c 5.7%  NUTRITION - FOCUSED PHYSICAL EXAM: Flowsheet Row Most Recent Value  Orbital Region Severe depletion  Upper Arm Region Severe depletion  Thoracic and Lumbar Region Severe depletion  Buccal Region Severe depletion  Temple Region Severe depletion  Clavicle Bone Region Severe depletion  Clavicle and Acromion Bone Region Severe  depletion  Scapular Bone Region Severe depletion  Dorsal Hand Severe depletion  Patellar Region Severe depletion  Anterior Thigh Region Severe depletion  Posterior Calf Region Severe depletion  Edema (RD Assessment) None  Hair Reviewed  Eyes Reviewed  Mouth Reviewed  Skin Reviewed  Nails Reviewed   Diet Order:   Diet Order             Diet regular Room service appropriate? Yes; Fluid consistency: Thin  Diet effective now                   EDUCATION NEEDS:  No education needs have been identified at this time  Skin:  Skin Assessment: Reviewed RN Assessment (Per WOC: full thickness wound to right abd draining mod amt liquid stool. 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  Estimated Nutritional Needs:  Kcal:  2000-2200 kcal/d Protein:  100-115 g/d Fluid:  >/=2 L/d   Ranell Patrick, RD, LDN Clinical Dietitian RD pager # available in [place]  After hours/weekend pager # available in [facility]

## 2022-01-14 NOTE — Progress Notes (Signed)
PT Cancellation Note  Patient Details Name: Albert Love MRN: [id number] DOB: March 26, 1941   Cancelled Treatment:    Reason Eval/Treat Not Completed: (P) Patient declined, no reason specified Pt just up to [facility] for extended amount of time and request PT come back later. PT will follow back later this afternoon as able.   Telesia Ates B. Migdalia Dk PT, DPT Acute Rehabilitation Services Please use secure chat or  Call Office [phone]   [place] 01/14/2022, 11:35 AM

## 2022-01-14 NOTE — Plan of Care (Signed)
Pt is alert oriented x 4. Pt c/o generalized pain, prn morphine given per order, medication effective. Pt has been turned and repositioned. Colostomy bag has been emptied.     Problem: Education: Goal: Knowledge of disease or condition will improve Outcome: Progressing Goal: Knowledge of secondary prevention will improve (SELECT ALL) Outcome: Progressing Goal: Knowledge of patient specific risk factors will improve (INDIVIDUALIZE FOR PATIENT) Outcome: Progressing   Problem: Coping: Goal: Will verbalize positive feelings about self Outcome: Progressing   Problem: Health Behavior/Discharge Planning: Goal: Ability to manage health-related needs will improve Outcome: Progressing   Problem: Self-Care: Goal: Ability to participate in self-care as condition permits will improve Outcome: Progressing Goal: Verbalization of feelings and concerns over difficulty with self-care will improve Outcome: Progressing   Problem: Nutrition: Goal: Risk of aspiration will decrease Outcome: Progressing   Problem: Education: Goal: Knowledge of General Education information will improve Description: Including pain rating scale, medication(s)/side effects and non-pharmacologic comfort measures Outcome: Progressing   Problem: Health Behavior/Discharge Planning: Goal: Ability to manage health-related needs will improve Outcome: Progressing   Problem: Clinical Measurements: Goal: Ability to maintain clinical measurements within normal limits will improve Outcome: Progressing Goal: Will remain free from infection Outcome: Progressing Goal: Diagnostic test results will improve Outcome: Progressing Goal: Respiratory complications will improve Outcome: Progressing Goal: Cardiovascular complication will be avoided Outcome: Progressing   Problem: Activity: Goal: Risk for activity intolerance will decrease Outcome: Progressing   Problem: Nutrition: Goal: Adequate nutrition will be  maintained Outcome: Progressing   Problem: Coping: Goal: Level of anxiety will decrease Outcome: Progressing   Problem: Elimination: Goal: Will not experience complications related to bowel motility Outcome: Progressing Goal: Will not experience complications related to urinary retention Outcome: Progressing   Problem: Pain Managment: Goal: General experience of comfort will improve Outcome: Progressing   Problem: Safety: Goal: Ability to remain free from injury will improve Outcome: Progressing   Problem: Skin Integrity: Goal: Risk for impaired skin integrity will decrease Outcome: Progressing

## 2022-01-14 NOTE — Progress Notes (Signed)
Provider with family medicine stated IVs could be left out.

## 2022-01-15 ENCOUNTER — Inpatient Hospital Stay (HOSPITAL_COMMUNITY): Payer: Medicare Other

## 2022-01-15 DIAGNOSIS — R140 Abdominal distension (gaseous): Secondary | ICD-10-CM | POA: Diagnosis not present

## 2022-01-15 DIAGNOSIS — K632 Fistula of intestine: Secondary | ICD-10-CM

## 2022-01-15 DIAGNOSIS — C189 Malignant neoplasm of colon, unspecified: Secondary | ICD-10-CM | POA: Diagnosis not present

## 2022-01-15 DIAGNOSIS — R1084 Generalized abdominal pain: Secondary | ICD-10-CM | POA: Diagnosis not present

## 2022-01-15 LAB — CBC
HCT: 23 % — ABNORMAL LOW (ref 39.0–52.0)
Hemoglobin: 7 g/dL — ABNORMAL LOW (ref 13.0–17.0)
MCH: 19.8 pg — ABNORMAL LOW (ref 26.0–34.0)
MCHC: 30.4 g/dL (ref 30.0–36.0)
MCV: 65 fL — ABNORMAL LOW (ref 80.0–100.0)
Platelets: 606 10*3/uL — ABNORMAL HIGH (ref 150–400)
RBC: 3.54 MIL/uL — ABNORMAL LOW (ref 4.22–5.81)
RDW: 17.7 % — ABNORMAL HIGH (ref 11.5–15.5)
WBC: 12 10*3/uL — ABNORMAL HIGH (ref 4.0–10.5)
nRBC: 0 % (ref 0.0–0.2)

## 2022-01-15 LAB — BASIC METABOLIC PANEL
Anion gap: 7 (ref 5–15)
BUN: 7 mg/dL — ABNORMAL LOW (ref 8–23)
CO2: 27 mmol/L (ref 22–32)
Calcium: 8 mg/dL — ABNORMAL LOW (ref 8.9–10.3)
Chloride: 101 mmol/L (ref 98–111)
Creatinine, Ser: 0.5 mg/dL — ABNORMAL LOW (ref 0.61–1.24)
GFR, Estimated: >60 mL/min (ref >60)
Glucose, Bld: 95 mg/dL (ref 70–99)
Potassium: 4.1 mmol/L (ref 3.5–5.1)
Sodium: 135 mmol/L (ref 135–145)

## 2022-01-15 LAB — TROPONIN I (HIGH SENSITIVITY)
Troponin I (High Sensitivity): 7 ng/L (ref <18)
Troponin I (High Sensitivity): 13 ng/L (ref <18)

## 2022-01-15 IMAGING — CT CT ABD-PELV W/ CM
2 of 4 series · 10 of 46 positions shown, 11 images · IV contrast (agent unspecified)
Comparison: [DATE]

CLINICAL DATA: Acute abdominal pain.  Colon carcinoma.

* Tracking Code: BO *
EXAM:
CT ABDOMEN AND PELVIS WITH CONTRAST
TECHNIQUE: Multidetector CT imaging of the abdomen and pelvis was performed
using the standard protocol following bolus administration of
intravenous contrast.

[Series 3: abdomen 5.0 · axial · 0.44mm/px · z∈[+1384,+1754]mm · 7 of 100 slices shown, 8 images]
[im 13/100  soft-tissue]
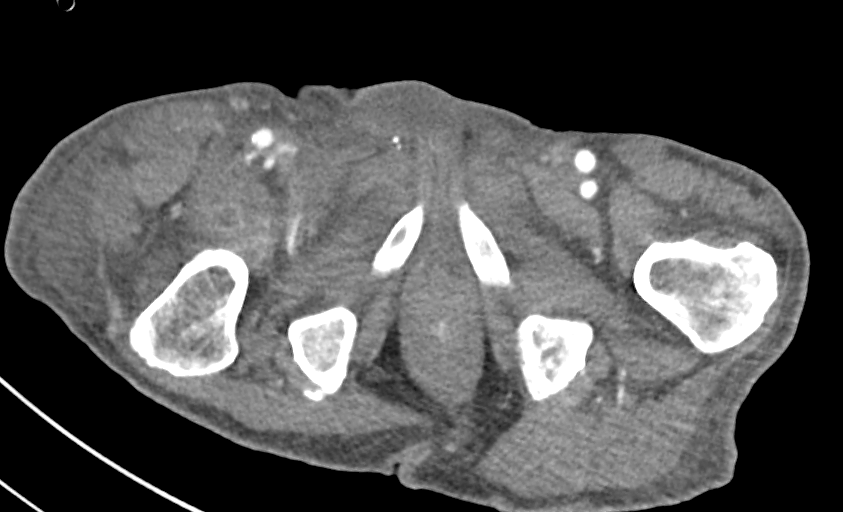
[im 13/100  bone]
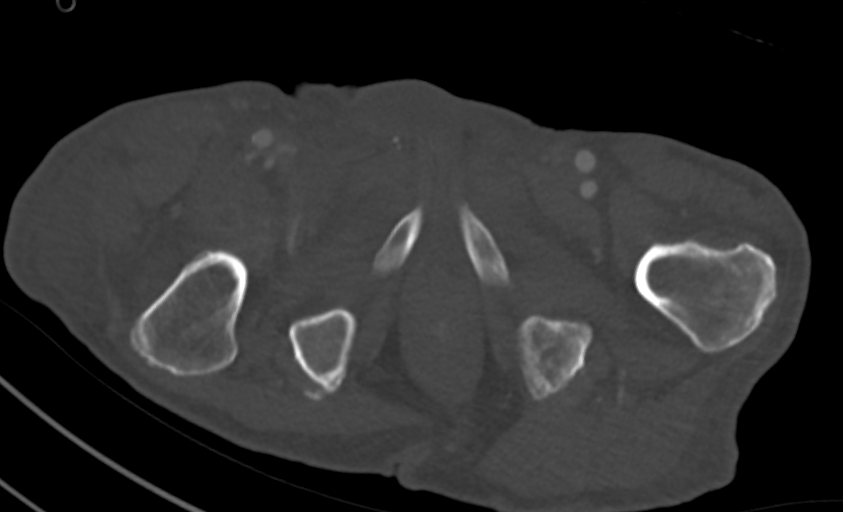
[im 25/100  soft-tissue]
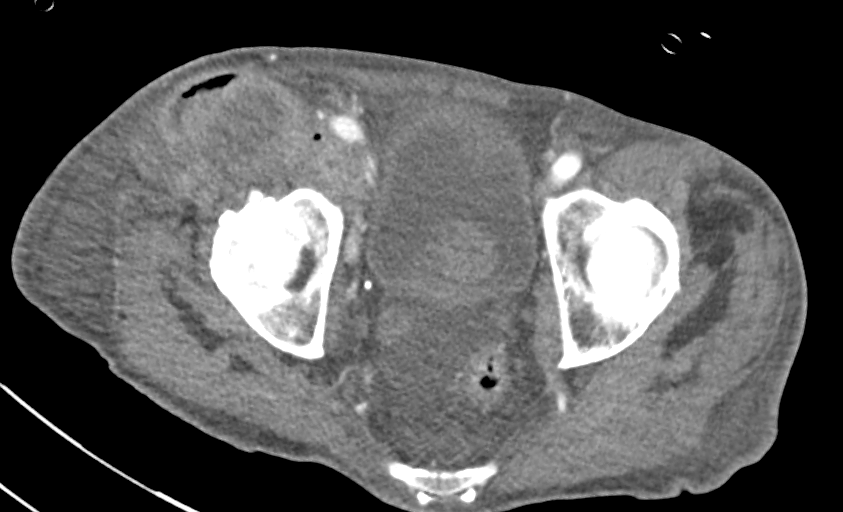
[im 38/100  soft-tissue]
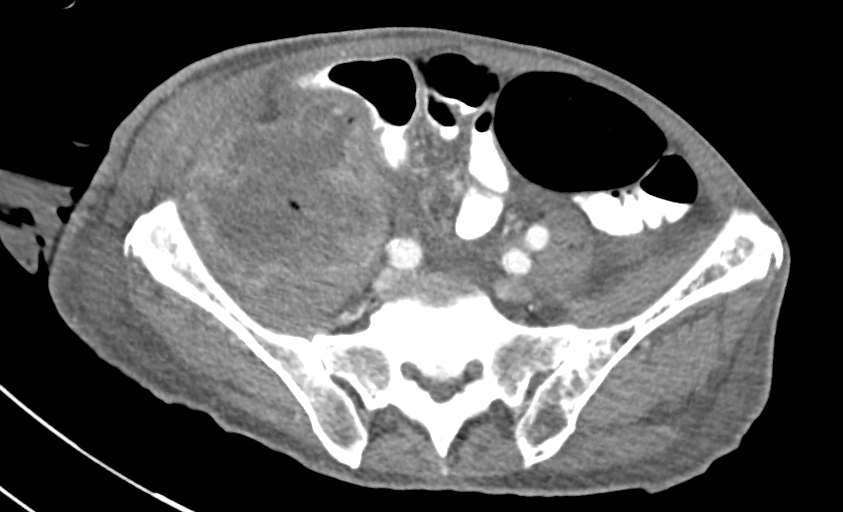
[im 50/100  soft-tissue]
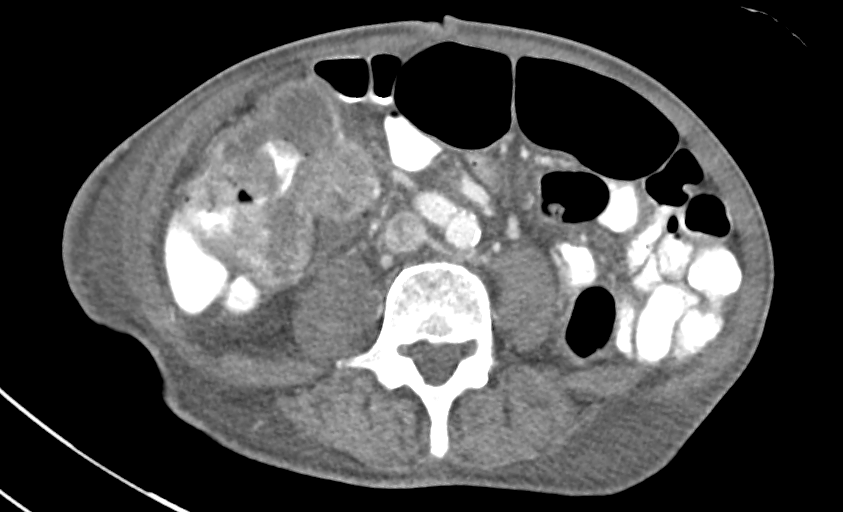
[im 62/100  soft-tissue]
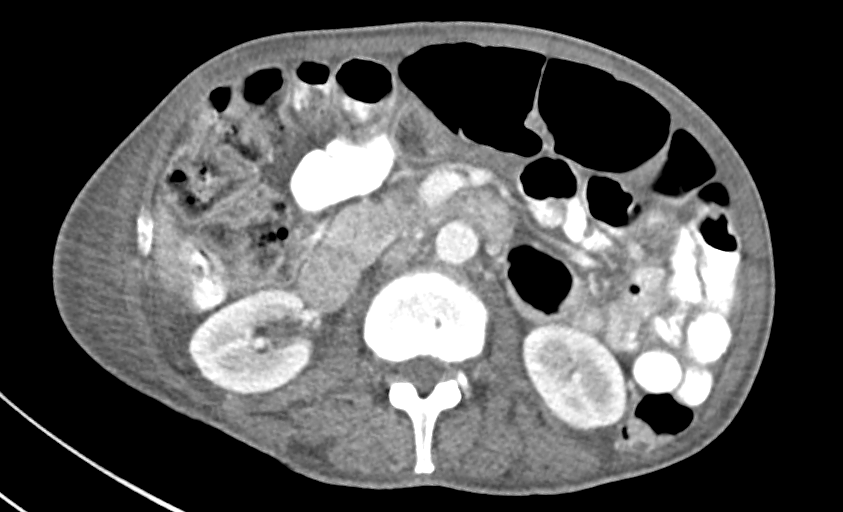
[im 75/100  soft-tissue]
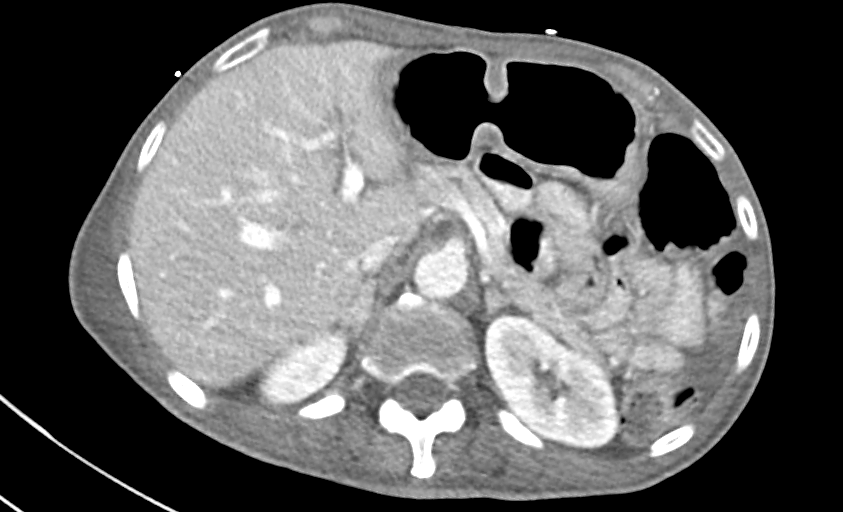
[im 87/100  soft-tissue]
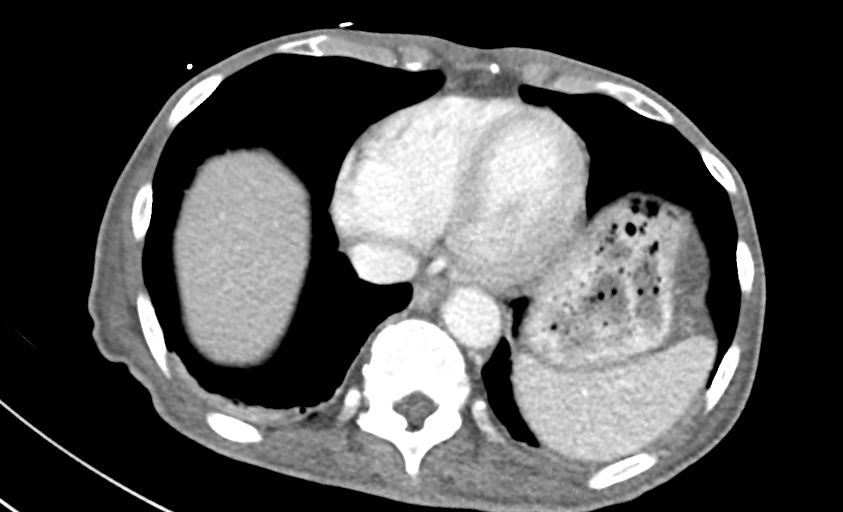

[Series 6: abdomen 3.0 mpr cor · coronal · 0.72mm/px · 3 of 73 slices shown]
[im 25/73  soft-tissue]
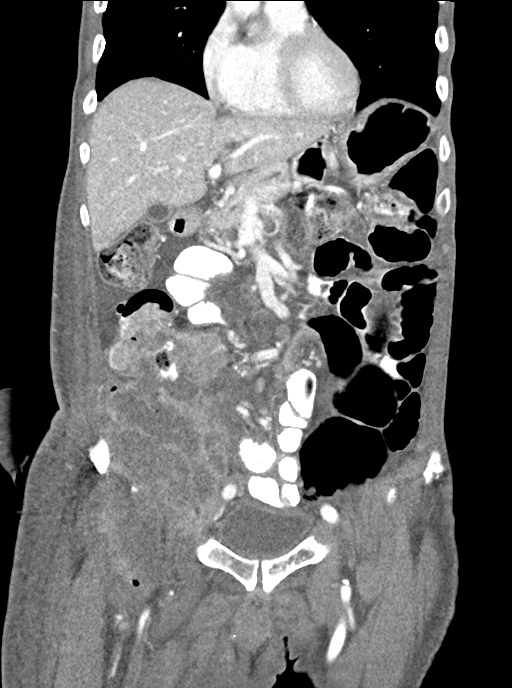
[im 33/73  soft-tissue]
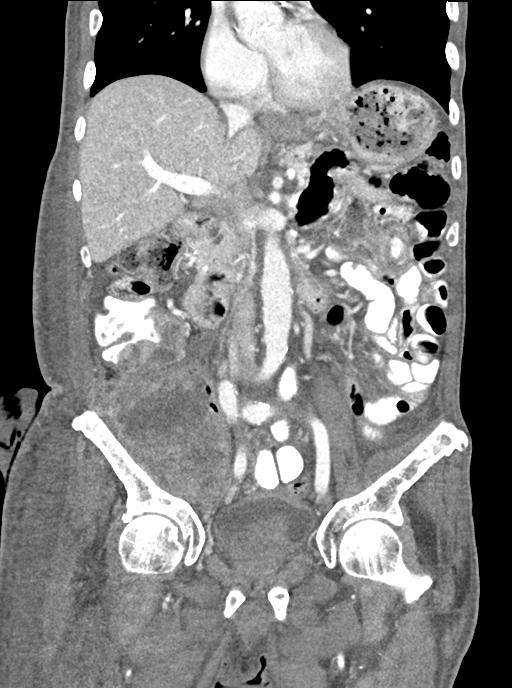
[im 41/73  soft-tissue]
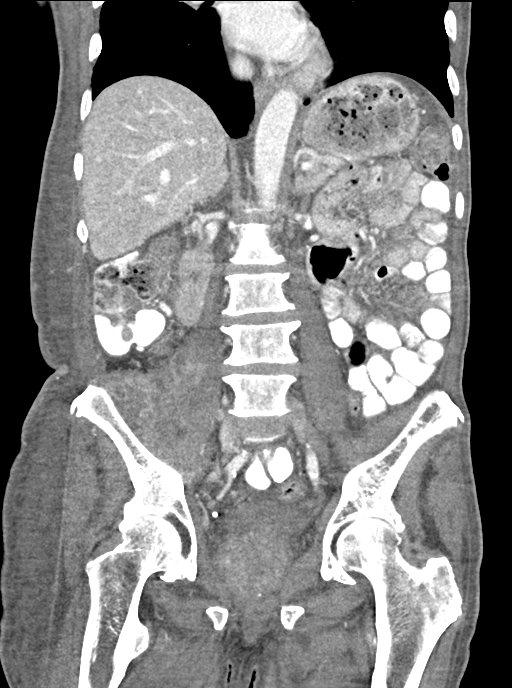

[10 of 46 positions shown; findings below may reference images not displayed]

RADIATION DOSE REDUCTION: This exam was performed according to the
departmental dose-optimization program which includes automated
exposure control, adjustment of the mA and/or kV according to
patient size and/or use of iterative reconstruction technique.

CONTRAST:  100mL OMNIPAQUE IOHEXOL 300 MG/ML  SOLN
FINDINGS: Lower Chest: 7 mm smoothly marginated pulmonary nodule is seen in
the posterior left lower lobe. Pulmonary metastasis cannot be
excluded.

Hepatobiliary: Small cyst noted in the central left hepatic lobe. No
hepatic masses identified. Gallbladder is unremarkable. No evidence
of biliary ductal dilatation.

Pancreas:  No mass or inflammatory changes.

Spleen: Within normal limits in size and appearance.

Adrenals/Urinary Tract: No masses identified. No evidence of
ureteral calculi or hydronephrosis.

Stomach/Bowel: Bulky annular soft tissue mass is again seen
involving the cecum and ascending colon, which shows central cystic
areas and internal air bubbles consistent with internal necrosis.
This mass again shows evidence of extracolonic extension with
involvement of the right iliopsoas muscles, and extension through
the right lower quadrant abdominal wall muscles into the right
groin. This measures 10.8 x 8.8 cm (images 64-65/3), without
significant change since previous study, and consistent with primary
colon carcinoma. No evidence bowel obstruction. No evidence of
abscess or free intraperitoneal air.

Vascular/Lymphatic: No pathologically enlarged lymph nodes. No acute
vascular findings. Aortic atherosclerotic calcification incidentally
noted.

Reproductive: Markedly enlarged prostate gland stable, with mass
effect on bladder base.

Other:  Mild free fluid in dependent pelvis.

Musculoskeletal: No suspicious bone lesions identified. Incidental
note is again made of Paget's disease involving the left hemipelvis.
IMPRESSION: No significant change in bulky annular soft tissue mass involving
the cecum and ascending colon, consistent with primary colon
carcinoma. This shows extra-colonic soft tissue extension with
involvement of the right iliopsoas muscle and right groin soft
tissues.

No evidence of distant abdominal or pelvic metastatic disease.

7 mm left lower lobe pulmonary nodule. Pulmonary metastasis cannot
be excluded.

Stable markedly enlarged prostate gland.

Aortic Atherosclerosis ([0H]-[0H]).

## 2022-01-15 IMAGING — DX DG ABDOMEN 1V
1 series · 1 of 1 positions shown · non-contrast
Comparison: [DATE] CT and prior studies

CLINICAL DATA: Increasing abdominal pain and distension. Patient
with colon cancer.

EXAM:
ABDOMEN - 1 VIEW

[abdomen]
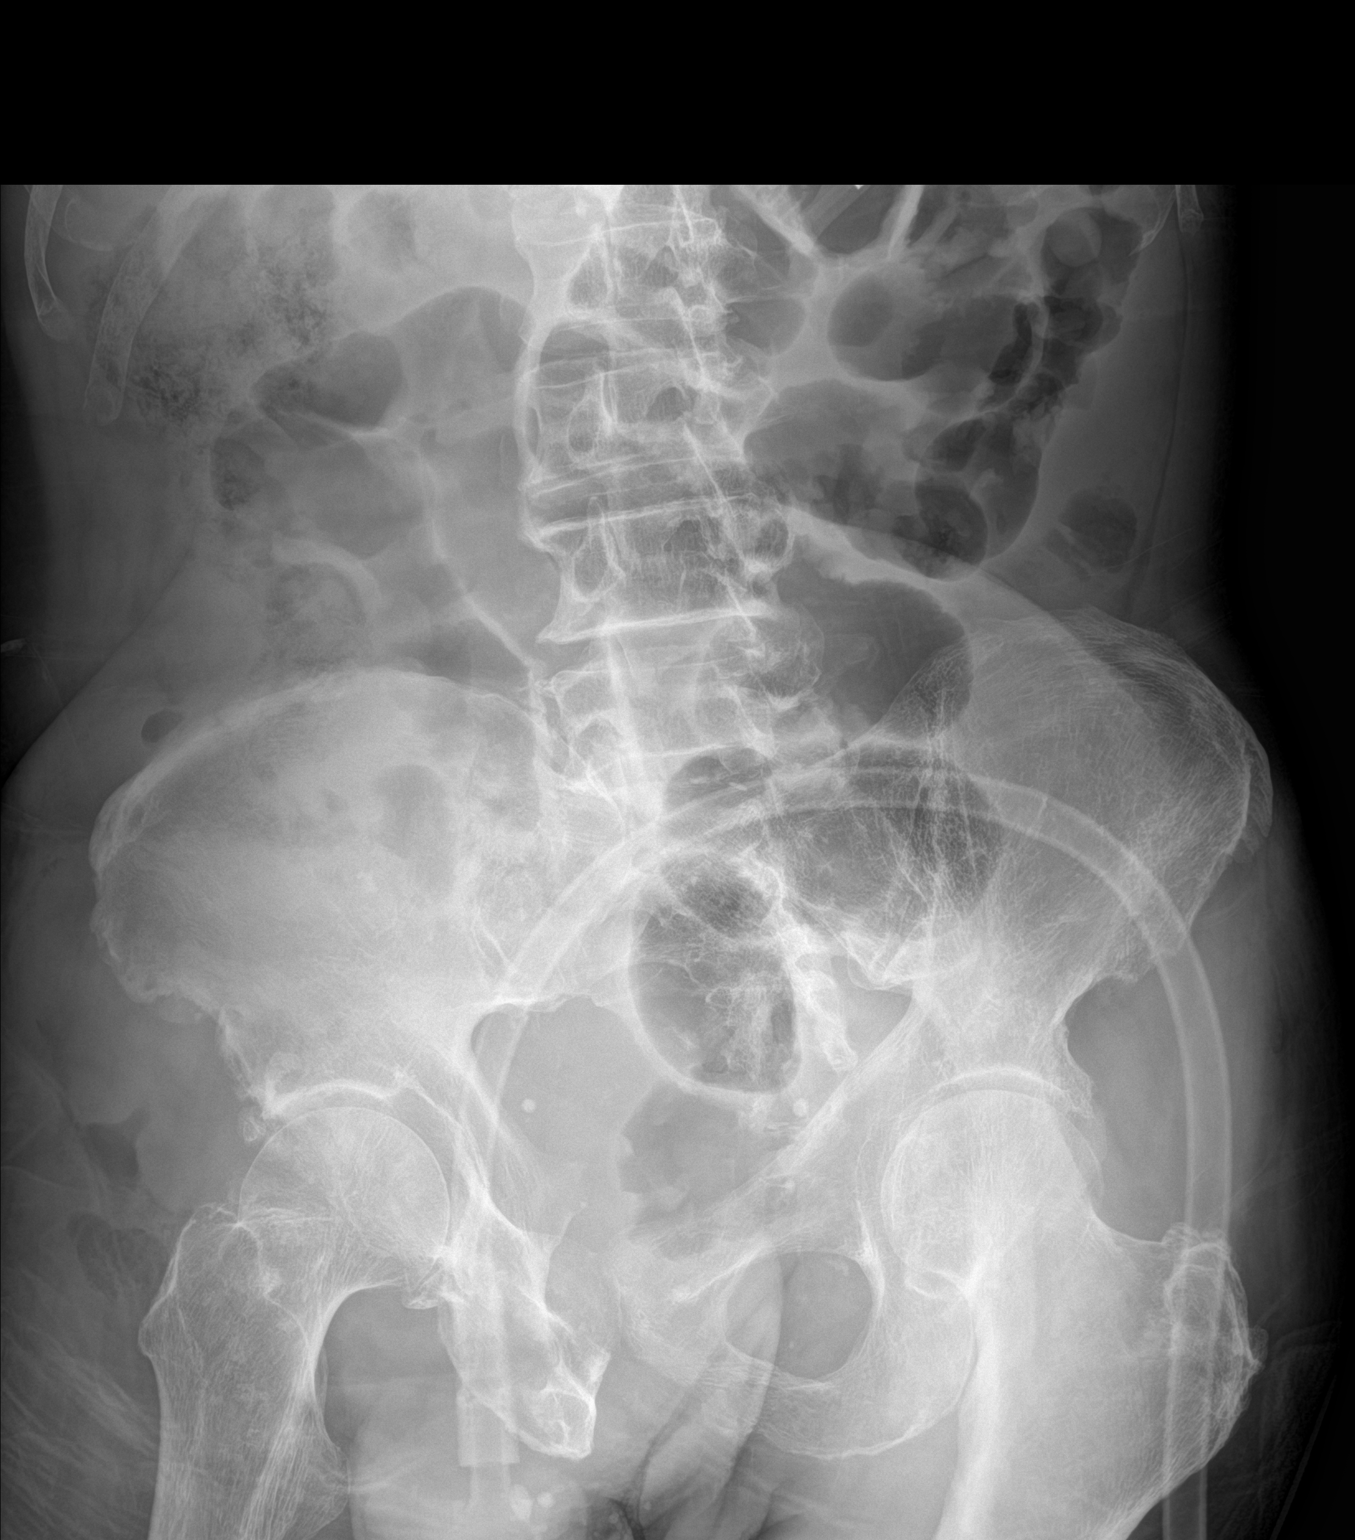

[1 of 1 positions shown; findings below may reference images not displayed]

FINDINGS: Nondistended gas-filled loops of small bowel and colon are noted.

No dilated bowel loops are present to suggest bowel obstruction.

No suspicious calcifications are present.

No acute bony abnormalities are noted.
IMPRESSION: Unremarkable bowel gas pattern.  No evidence of bowel obstruction.

## 2022-01-15 MED ORDER — DICYCLOMINE HCL 20 MG PO TABS
20.0000 mg | ORAL_TABLET | Freq: Once | ORAL | Status: AC
  Administered 2022-01-15: 20 mg via ORAL
  Filled 2022-01-15: qty 1

## 2022-01-15 MED ORDER — IOHEXOL 300 MG/ML  SOLN
100.0000 mL | Freq: Once | INTRAMUSCULAR | Status: AC | PRN
  Administered 2022-01-15: 100 mL via INTRAVENOUS

## 2022-01-15 MED ORDER — IOHEXOL 9 MG/ML PO SOLN
ORAL | Status: AC
  Administered 2022-01-15: 500 mL
  Filled 2022-01-15: qty 1000

## 2022-01-15 MED ORDER — POLYETHYLENE GLYCOL 3350 17 G PO PACK: 17.0000 g | PACK | Freq: Every day | ORAL | Status: DC | PRN

## 2022-01-15 NOTE — Progress Notes (Signed)
FPTS Interim Night Progress Note  S:Patient sleeping comfortably.  Rounded with primary night RN.  No concerns voiced.  No orders required.    O: Today's Vitals   01/15/22 0900 01/15/22 1141 01/15/22 1540 01/15/22 2200  BP: 120/74 126/79 120/70   Pulse: 90 73 94   Resp: '14 20 20   '$ Temp: 99 F (37.2 C) 98.1 F (36.7 C) 98.1 F (36.7 C)   TempSrc: Oral Oral Oral   SpO2:  100% 99%   Weight:      Height:      PainSc: 0-No pain   4       A/P: Continue current management  Carollee Leitz MD PGY-3, [facility] Service pager [phone]

## 2022-01-15 NOTE — Care Management (Addendum)
Reviewed case with Dr Doy Hutching, physician advisor, advised to give [facility], Hospital Requested Review through [facility].  Spoke w patient's daughter Lauralyn Primes at [phone], and read [facility] 10 letter to her,  Ronny Bacon notified that estimated continued stay is approximately $4679.00 per day. Ronny Bacon notified that patient may also still be incurring charges from 01/13/22 at noon when coverage ended per [facility]. Ronny Bacon agreeable to verbal signature and copy of form to be left in patient's room.  Explained to Ronny Bacon that I will be calling [facility] to notify them of HINN 10.  Ronny Bacon remains agreeable for discharge plan to be patient returning to her home under the care of [facility] at any time.    Hospital Requested Review called in to [facility] Case ID: [id number]  File uploaded at 17:20 to [place] # [id number]   Notified Andreas Newport RN CM TOC Supervisor on call of HINN 10 notification  16:30 Spoke to patient at bedside, showed him HINN 10, explained form to him and Hospital Request to Review, as well as potential charged as noted above. Patient stated, "Why would anyone want to stay in the hospital?" Patient was not aware that Ronny Bacon had what she needed at home for him and was willing to bring him home this morning.   16:40 Received call from Ronny Bacon, patient's daughter, stating that she spoke with her brother and they would like the patient to discharge to home tonight to avoid the chance of having a large hospital bill. Marissa Calamity that the Request for Review was just submitted and there would not be a final determination until tomorrow. She feels that since the appeal was lost this would likely be as well, and they wanted to err on the side of caution in to avoid a large bill. Informed Ronny Bacon I would let the MD know, and advised her to discuss with the physician. Requested MD Precious Gilding to speak with caregiver Ronny Bacon. Nurse provided with  instructions to call [facility] if DC order placed.

## 2022-01-15 NOTE — TOC Transition Note (Addendum)
Transition of Care [facility]) - CM/SW Discharge Note   Patient Details  Name: Albert Love MRN: [id number] Date of Birth: 06-01-1941  Transition of Care [facility]) CM/SW Contact:  Carles Collet, RN Phone Number: 01/15/2022, 8:29 AM   Clinical Narrative:    Spoke with daughter , [name] [phone]. Discussed plan for DC today to her house with [facility]. She confirmed her address  [address] [place], [place] [address] And that [facility] would be needed. She confirms that hospital bed is delivered and she has what she needs to care for him.  She requests 2 ostomy bags be sent home with him, I have requested this of the bedside nurse.  Medi HH and Hospice liaison notified of DC planned for today [facility] forms placed on chart,  Per RN and MD he is to have a CT of his abdomen prior to DC. RN will inform when he is back so [facility] can be called.   13:00 Per MD they will review CT with family prior to DC. Informed team that he has been financially responsible since yesterday at noon. Notified Dr Doy Hutching physician advisor of case. Per Dr sparks if CT normal, patient should DC.  14:50 Notified MD and Dr Doy Hutching of CT results, CT reviewed by Dr Doy Hutching PA to be appearing w/o acute change.     Overall Status IM Appeal Case: [id number] is complete.  1. Discharge Appeal Started 01/12/2022 13:25:28  2. Medical Record Requested 01/12/2022 13:44:50  3. Documents Received 01/12/2022 14:48:30  4. In Clinical Review 01/12/2022 16:55:05  5. Physician Review Completed 01/13/2022 09:33:49  6. Financial Liability Determined The Physician agreed with the notice. Beneficiary Liability Starts on 01/14/2022.  7. Notification Provider: 01/13/2022 16:08:55 Caller: 01/13/2022 16:08:53  8. Case Complete Yes  Final next level of care: Home w Hospice Care Barriers to Discharge: No Barriers Identified   Patient Goals and CMS Choice Patient states their goals for this hospitalization and  ongoing recovery are:: to get back home and stay home CMS Medicare.gov Compare Post Acute Care list provided to:: Patient Choice offered to / list presented to : Patient  Discharge Placement                Patient to be transferred to facility by: Family Name of family member notified: [name] Patient and family notified of of transfer: 01/11/22  Discharge Plan and Services     Post Acute Care Choice: NA                        Date HH Agency Contacted: 01/15/22 Time [place]: 8563 Representative spoke with at [place]: [place],  [facility]  Social Determinants of Health (SDOH) Interventions     Readmission Risk Interventions     View : No data to display.

## 2022-01-15 NOTE — Progress Notes (Signed)
FPTS Brief Progress Note  S: Patient reports he has had continued intermittent abdominal pain, no further chest pain.    O: BP 133/74 (BP Location: Left Arm)   Pulse 92   Temp 98.4 F (36.9 C) (Oral)   Resp 16   Ht '5\' 11"'$  (1.803 m)   Wt 61.9 kg   SpO2 99%   BMI 19.03 kg/m   Nursing note and vitals reviewed GEN: cachectic, elderly AAM, resting in bed, NAD  Cardiac: Regular rate and rhythm. Normal S1/S2. No murmurs, rubs, or gallops appreciated. 2+ radial pulses. Lungs: Clear bilaterally to ascultation. No increased WOB, no accessory muscle usage. No w/r/r. Abdomen: soft, distended, tympanitic, rigid around form JP drain site, mildly tender to palpation in RLQ (near former JP drain site). Ostomy bag inflated. Neuro: AOx3  Ext: no edema Psych: Pleasant and appropriate   A/P: - Unclear what is prompting large amount of gas, however, patient likely has significant amount of gas in bowels causing pain and bloating. Ostomy bag around the site of the former JP drain is now inflated, abdomen tympanitic. - KUB read- pending; preliminarily appears to be a lot of gas in bowel loops - Trops, BMP, CBC - pending - Discussed with Dr. Nori Riis, attending, will get CT a/p  Gladys Damme, MD 01/15/2022, 6:46 AM PGY-3, [facility] Night Resident  Please page [phone] with questions.

## 2022-01-15 NOTE — Plan of Care (Signed)
EKG completed per order but did not transfer to computer, physical copy in chart. Provider reviewed it at bedside.    Problem: Education: Goal: Knowledge of disease or condition will improve Outcome: Progressing Goal: Knowledge of secondary prevention will improve (SELECT ALL) Outcome: Progressing Goal: Knowledge of patient specific risk factors will improve (INDIVIDUALIZE FOR PATIENT) Outcome: Progressing   Problem: Coping: Goal: Will verbalize positive feelings about self Outcome: Progressing   Problem: Health Behavior/Discharge Planning: Goal: Ability to manage health-related needs will improve Outcome: Progressing   Problem: Self-Care: Goal: Ability to participate in self-care as condition permits will improve Outcome: Progressing Goal: Verbalization of feelings and concerns over difficulty with self-care will improve Outcome: Progressing   Problem: Nutrition: Goal: Risk of aspiration will decrease Outcome: Progressing   Problem: Education: Goal: Knowledge of General Education information will improve Description: Including pain rating scale, medication(s)/side effects and non-pharmacologic comfort measures Outcome: Progressing   Problem: Health Behavior/Discharge Planning: Goal: Ability to manage health-related needs will improve Outcome: Progressing   Problem: Clinical Measurements: Goal: Ability to maintain clinical measurements within normal limits will improve Outcome: Progressing Goal: Will remain free from infection Outcome: Progressing Goal: Diagnostic test results will improve Outcome: Progressing Goal: Respiratory complications will improve Outcome: Progressing Goal: Cardiovascular complication will be avoided Outcome: Progressing   Problem: Activity: Goal: Risk for activity intolerance will decrease Outcome: Progressing   Problem: Nutrition: Goal: Adequate nutrition will be maintained Outcome: Progressing   Problem: Coping: Goal: Level of  anxiety will decrease Outcome: Progressing   Problem: Elimination: Goal: Will not experience complications related to bowel motility Outcome: Progressing Goal: Will not experience complications related to urinary retention Outcome: Progressing   Problem: Pain Managment: Goal: General experience of comfort will improve Outcome: Progressing   Problem: Safety: Goal: Ability to remain free from injury will improve Outcome: Progressing   Problem: Skin Integrity: Goal: Risk for impaired skin integrity will decrease Outcome: Progressing

## 2022-01-15 NOTE — Progress Notes (Addendum)
Family Medicine Teaching Service Daily Progress Note Intern Pager: [phone]  Patient name: Albert Love record number: [id number] Date of birth: 10/20/1940 Age: 81 y.o. Gender: male  Primary Care Provider: Gladys Damme, MD Consultants: Neurology, palliative Code Status: DNR  Pt Overview and Major Events to Date:  5/23-admitted  Assessment and Plan: Albert Love is a 81 y.o. male who presented with right upper extremity weakness and fall. 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  We will follow-up on abdominal pain as noted below before discharge.   Sepsis 2/2 RLQ wound infection, resolved  Abdominal pain Patient sent letter septic, VSS, s/p Augmentin.  Overnight patient reported intermittent abdominal pain and chest pain which resolved.  Troponin ordered due to chest pain which was 7.  He was seen by night team who noted an inflated ostomy bag, distended abdomen.  Abdominal x-ray was obtained which showed unremarkable bowel gas pattern with no evidence of obstruction.  CT abdomen pelvis ordered. Did have pink tinged brown fluid in ostomy bag this morning. Abdominal pain likely d/t gas from miralax but will see what CT shows and keep pt overnight.  -f/u CT results -Morphine 5 mg as needed -Percocet 5/325 mg twice daily -Zofran 4 mg every 6 hours -Narcan.  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  He thinks this pain is similar to pain he had in the past.  He states his right arm weakness continues to improve.  Objective: Temp:  [98.1 F (36.7 C)-99.1 F (37.3 C)] 98.4 F (36.9 C) (06/03 0342) Pulse Rate:  [89-104] 92 (06/03 0342) Resp:  [16-20] 16 (06/03 0342) BP: (113-146)/(66-75) 133/74 (06/03 0342) SpO2:  [97 %-100 %] 99 % (06/03 0342) Physical Exam: General: 81 year old man, lying in bed, pleasant to speak with, NAD Cardiovascular: RRR, normal S1/S2 Respiratory: CTAB, normal effort Abdomen: Bowel sounds present, very mildly distended, soft, firmness around chronic wound unchanged from previous exams, pink-tinged brown liquid in ostomy bag. Extremities: Right wrist splint in place  Laboratory: Recent Labs  Lab 01/09/22 0207 01/10/22 0449 01/11/22 0609  WBC 18.1* 15.4* 16.0*  HGB 7.8* 7.4* 8.0*  HCT 25.9* 24.8* 26.4*  PLT 479* 500* 519*   Recent Labs  Lab 01/10/22 0449 01/13/22 0330  NA 134* 133*  K 3.5 3.5  CL 100 99  CO2 26 27  BUN 8 8  CREATININE 0.68 0.63  CALCIUM 7.8* 8.0*  GLUCOSE 137* 110Precious Gilding, DO 01/15/2022, 7:21 AM PGY-1, [facility] Intern pager: [phone], text pages welcome

## 2022-01-15 NOTE — Progress Notes (Signed)
Pt pt c/o chest pain and right lower abdomen pain. Pt right lower abdomen hard. On call providers for Family medicine paged to inform. Prn morphine given for pain.

## 2022-01-15 NOTE — Progress Notes (Signed)
Arrived at patient's bed side accompanied by Dr. Chauncey Reading after page from nurse about patient's complain of left side chest pain and lower abdominal pain. Upon arrival patient was laying down in bed. His pain started couple of minutes ago and he's had similar pain like that recently that went away spontaneously. Pain is non radiating and patient thinks it's most likely due to the position of his hand behind his head. Pain is unchanged with movement or deep breath . In addition patient also complained of right lower abdominal pain around his previous JP site which he said is more severe than his chest pain. Per patient pain comes and goes and feels like its being squeezed. He reported having big bowel movement earlier today with the help of miralax after days of constipation. He report the area is non-tender and the JP site is draining more than usual. Patient feels like the RLQ of the abdomen feels harder than usual. On exam  RLQ is hard, and non tender. He has positive bowel sounds and cardiac exam shows RRR with no murmurs. Obtained bedside EKG showed NSR without ST changes. Will check his troponin. Given his complain of abdominal pain coming in waves and crampy, will give patient Bentyl and obtain KUB.   Of note prior to arrival nurse report she has given patient his PRN morphine which patient said improved his pain. Will continue to monitor patient closely and follow up with KUB imagining and Troponin level.   Alen Bleacher, MD PGY-1, [facility] Resident  Please page [phone] with questions.

## 2022-01-16 DIAGNOSIS — R29898 Other symptoms and signs involving the musculoskeletal system: Secondary | ICD-10-CM

## 2022-01-16 DIAGNOSIS — K632 Fistula of intestine: Secondary | ICD-10-CM | POA: Diagnosis not present

## 2022-01-16 DIAGNOSIS — I639 Cerebral infarction, unspecified: Secondary | ICD-10-CM | POA: Diagnosis not present

## 2022-01-16 DIAGNOSIS — C189 Malignant neoplasm of colon, unspecified: Secondary | ICD-10-CM | POA: Diagnosis not present

## 2022-01-16 LAB — CBC
HCT: 22.6 % — ABNORMAL LOW (ref 39.0–52.0)
Hemoglobin: 6.9 g/dL — CL (ref 13.0–17.0)
MCH: 19.7 pg — ABNORMAL LOW (ref 26.0–34.0)
MCHC: 30.5 g/dL (ref 30.0–36.0)
MCV: 64.4 fL — ABNORMAL LOW (ref 80.0–100.0)
Platelets: 597 10*3/uL — ABNORMAL HIGH (ref 150–400)
RBC: 3.51 MIL/uL — ABNORMAL LOW (ref 4.22–5.81)
RDW: 17.8 % — ABNORMAL HIGH (ref 11.5–15.5)
WBC: 12.2 10*3/uL — ABNORMAL HIGH (ref 4.0–10.5)
nRBC: 0 % (ref 0.0–0.2)

## 2022-01-16 NOTE — Plan of Care (Signed)
  Problem: Health Behavior/Discharge Planning: Goal: Ability to manage health-related needs will improve Outcome: Adequate for Discharge  with  [facility]

## 2022-01-16 NOTE — Progress Notes (Signed)
Physical Therapy Treatment Patient Details Name: Albert Love MRN: [id number] DOB: [birth year] Today's Date: 01/16/2022   History of Present Illness 81 y/o male presenting from home with complaint of R UE weakness.  MRI showed a small acute to subacute infarct in the inferior right cerebellum.  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    PT Comments    Very pleasant and motivated to work with therapy to return home as soon as possible. Patient reports having a difficult day yesterday but feeling better today. Still tired and weak but eager to get out of bed (min assist.) Transfers reviewed with min assist. Ambulated with min assist up to 85 feet today, very fatigued at end of distance, intermittent cues for walker proximity for safety. Patient will continue to benefit from skilled physical therapy services to further improve independence with functional mobility.    Recommendations for follow up therapy are one component of a multi-disciplinary discharge planning process, led by the attending physician.  Recommendations may be updated based on patient status, additional functional criteria and insurance authorization.  Follow Up Recommendations  Home health PT     Assistance Recommended at Discharge Intermittent Supervision/Assistance  Patient can return home with the following A little help with walking and/or transfers;A little help with bathing/dressing/bathroom   Equipment Recommendations  Rolling walker (2 wheels)    Recommendations for Other Services       Precautions / Restrictions Precautions Precautions: Fall Precaution Comments: colostomy Required Braces or Orthoses: Splint/Cast Splint/Cast: R wrist cock-up and R radial nerve palsy splint Restrictions Weight Bearing Restrictions: No      Mobility  Bed Mobility Overal bed mobility: Needs Assistance Bed Mobility: Supine to Sit     Supine to sit: Min assist     General bed mobility comments: Min assist for pt pull through therapist hand and support trunk, as well as a little help with RLE to EOB, pt feels stiff from being in bed. Cues for technique, extra time.    Transfers Overall transfer level: Needs assistance Equipment used: Rolling walker (2 wheels) Transfers: Sit to/from Stand Sit to Stand: Min assist           General transfer comment: Min assist for boost from bed x2. Cues for anterior weight shift, pt declined elevated bed surface, making it a bit more difficult due to leg length.    Ambulation/Gait Ambulation/Gait assistance: Min assist Gait Distance (Feet): 85 Feet Assistive device: Rolling walker (2 wheels) Gait Pattern/deviations: Trunk flexed, Step-to pattern Gait velocity: dec Gait velocity interpretation: <1.31 ft/sec, indicative of household ambulator   General Gait Details: Cues for larger step length, appropriate AD use with RW including proximity to device, esp with turns. Min assist for keeping Rt hand aligned on RW intermittently, and to faciliate RW direction at times. Very fatigued towards end of distance HR in 130s.   Stairs             Wheelchair Mobility    Modified Rankin (Stroke Patients Only) Modified Rankin (Stroke Patients Only) Pre-Morbid Rankin Score: No significant disability Modified Rankin: Moderately severe disability     Balance Overall balance assessment: Needs assistance Sitting-balance support: No upper extremity supported, Feet supported Sitting balance-Leahy Scale: Fair     Standing balance support: Single extremity supported Standing balance-Leahy Scale: Poor  Cognition Arousal/Alertness: Awake/alert Behavior During Therapy: WFL for tasks assessed/performed Overall Cognitive Status: Within  Functional Limits for tasks assessed                                          Exercises      General Comments        Pertinent Vitals/Pain Pain Assessment Pain Assessment: No/denies pain Pain Intervention(s): Monitored during session    Home Living                          Prior Function            PT Goals (current goals can now be found in the care plan section) Acute Rehab PT Goals Patient Stated Goal: let's get on with it and out of here. PT Goal Formulation: With patient Time For Goal Achievement: 01/20/22 Potential to Achieve Goals: Good Progress towards PT goals: Progressing toward goals    Frequency    Min 4X/week      PT Plan Current plan remains appropriate    Co-evaluation              AM-PAC PT "6 Clicks" Mobility   Outcome Measure  Help needed turning from your back to your side while in a flat bed without using bedrails?: A Little Help needed moving from lying on your back to sitting on the side of a flat bed without using bedrails?: A Little Help needed moving to and from a bed to a chair (including a wheelchair)?: A Little Help needed standing up from a chair using your arms (e.g., wheelchair or bedside chair)?: A Little Help needed to walk in hospital room?: A Lot Help needed climbing 3-5 steps with a railing? : A Lot 6 Click Score: 16    End of Session Equipment Utilized During Treatment: Gait belt Activity Tolerance: Patient tolerated treatment well Patient left: with call bell/phone within reach;in chair;with chair alarm set   PT Visit Diagnosis: Unsteadiness on feet (R26.81);Muscle weakness (generalized) (M62.81)     Time: 7026-3785 PT Time Calculation (min) (ACUTE ONLY): 36 min  Charges:  $Gait Training: 8-22 mins $Therapeutic Activity: 8-22 mins                     Candie Mile, PT    Ellouise Newer 01/16/2022, 11:45 AM

## 2022-01-16 NOTE — Progress Notes (Signed)
FPTS Interim Progress Note  S:Received page from [place] with critical Hbg 6.9.  Patient hemodynamically stable.  Went to assess patient.  Denies any chest pain, shortness of breath or obvious signs of bleeding.  Has not received transfusions in past.  Discussed risks and benefits of transfusion.    O: BP 135/75   Pulse 84   Temp 98.4 F (36.9 C) (Oral)   Resp 20   Ht '5\' 11"'$  (1.803 m)   Wt 61.9 kg   SpO2 99%   BMI 19.03 kg/m     A/P: Asymptomatic Anemia Hbg decreased to 6.9 today.  Asymptomatic.  Called son to obtain consent but no answer.  Patient wanting to confirm with son prior to transfusion. Given that patient remains asymptomatic would likely not benefit from transfusion at this point.   -Discussed with patient and agreed  Carollee Leitz, MD 01/16/2022, 5:19 AM PGY-3, [place] [facility] pager [phone]

## 2022-01-16 NOTE — TOC Transition Note (Signed)
Transition of Care [facility]) - CM/SW Discharge Note   Patient Details  Name: Albert Love MRN: [id number] Date of Birth: 09-Dec-1940  [facility] CM/SW Contact:  Carles Collet, RN Phone Number: 01/16/2022, 1:45 PM   Clinical Narrative:    Spoke with patient's daughter Ronny Bacon, and she states she is ready for the patient to come home. She and her husband will come to the hospital in around an hour or so and take him home. Bedside nurse updated.     Final next level of care: Home w Hospice Care Barriers to Discharge: No Barriers Identified   Patient Goals and CMS Choice Patient states their goals for this hospitalization and ongoing recovery are:: to get back home and stay home CMS Medicare.gov Compare Post Acute Care list provided to:: Patient Choice offered to / list presented to : Patient  Discharge Placement                Patient to be transferred to facility by: Family Name of family member notified: [name] Patient and family notified of of transfer: 01/11/22  Discharge Plan and Services     Post Acute Care Choice: NA                        Date HH Agency Contacted: 01/15/22 Time [place]: [phone] Representative spoke with at [place]: [place], [place] [facility]  Social Determinants of Health (SDOH) Interventions     Readmission Risk Interventions     View : No data to display.

## 2022-01-16 NOTE — Care Management Important Message (Signed)
Important Message  Patient Details  Name: Albert Love MRN: [id number] Date of Birth: 1941-03-03   Medicare Important Message Given:  Yes Copy provided and explained to patient at bedside. Patient alert and oriented and acknowledged letter.      Carles Collet, RN 01/16/2022, 11:11 AM

## 2022-01-16 NOTE — Discharge Summary (Signed)
[facility] Discharge Summary  Patient name: Albert Love record number: [id number] Date of birth: [birth year] Age: 81 y.o. Gender: male Date of Admission: 01/04/2022  Date of Discharge: 01/16/2022 Admitting Physician: Nita Sells, MD  Primary Care Provider: Gladys Damme, MD Consultants: Neurology  Indication for Hospitalization: Sepsis, acute stroke  Discharge Diagnoses/Problem List:  Principal Problem:   Acute ischemic stroke [facility]) Active Problems:   Anemia   Sepsis due to cellulitis ([place])   Colon cancer ([place])   HLD (hyperlipidemia)   Tobacco abuse   GERD (gastroesophageal reflux disease)   Stroke (cerebrum) ([place])   Abdominal wall fistula  Disposition: Home with hospice  Discharge Condition: Stable  Discharge Exam:  Gen: alert, resting comfortably in bed, NAD CV: RRR, normal S1/S2 Resp: normal effort Abd: soft, nontender, RLQ percutaneous drainage bag in place with brown liquid output Ext: Wrist brace in place on R wrist Neuro: alert, oriented x4, normal speech  Brief Hospital Course:  Albert Love is a 81 y.o. male who presented with right upper extremity weakness and fall. 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  CT head showed small age-indeterminate infarct in the right cerebellum. MRI brain showed small acute or subacute infarct in the inferior right cerebellum, with mild edema without mass effect. Neurology was consulted who recommended that pattern of weakness suggests primarily  radial nerve injury but some component from ulnar nerve mediated muscles as well likely represents mild brachial plexus injury. MRI is negative for any left hemispheric infarct.  Recommended he may benefit with outpatient nerve conduction study.   Asymptomatic right cerebellar stroke  Pt was started on ASA and aggressive risk factor modification. Echo showed  EF 55-60% with grade I diastolic dysfunction and significant valvular abnormalities. CT head and neck showed no intracranial large vessel occlusion or significant stenosis and no hemodynamically significant stenosis in the neck.   Sepsis 2/2 fistula in RLQ (from previous JP drain)  LA elevated to 3.3.>1.4. WBC elevated to 16.8 initially with neutrophilic predominance. Tachypneic as well. CXR and UA negative. Pt initially received Vancomycin, Rocephin and mIVF and transitioned to Augmentin PO. At discharge patients vitals were stable he had completed augmentin course ending 5/28.   Colon cancer  Abdominal pain  Goals of Care Pt no longer pursuing chemo/radiation and is in hospice. CT abdomen showed interval increase in size of the complex, heterogeneous mass involving the cecum and proximal to mid ascending colon. CEA elevated to 25.1 (up from 4.8 in Oct 2022). Patient would like to avoid future hospitalizations and goal to live in his apartment as long as possible. Patient wanted PT for weakness/radial neuropathy. Prior to discharge, pt had abdominal pain, inflated RLQ drainage bag, and distended abdomen.  Abdominal x-ray was obtained which showed unremarkable bowel gas pattern with no evidence of obstruction and CT abdomen with no acute findings. Pain resolved and likely related to gas.   Microcytic anemia 2/2 colon cancer  Hgb 8.4, MCV 65 on admission. At discharge Hgb was 6.9, but patient was asymptomatic, so transfusion was not pursued.   Issues for Follow Up:  EMG/nerve conduction studies in 3-4  weeks  If worsening symptoms, neuro  recommends MRI brachial plexus for possible malignant involvement  Pt would like to avoid future hospitalizations and remain in his apartment as long as possible  Significant Procedures: None  Significant Labs and Imaging:  Recent Labs  Lab 01/11/22 0609 01/15/22 0750 01/16/22 0246  WBC 16.0* 12.0* 12.2*  HGB 8.0* 7.0* 6.9*  HCT 26.4* 23.0* 22.6*  PLT 519* 606* 597*   Recent Labs  Lab 01/10/22 0449 01/13/22 0330 01/15/22 0750  NA 134* 133* 135  K 3.5 3.5 4.1  CL 100 99 101  CO2 '26 27 27  '$ GLUCOSE 137* 110* 95  BUN 8 8 7*  CREATININE 0.68 0.63 0.50*  CALCIUM 7.8* 8.0* 8.0*     Results/Tests Pending at Time of Discharge: none  Discharge Medications:  Allergies as of 01/16/2022   No Known Allergies      Medication List     STOP taking these medications    cephALEXin 500 MG capsule Commonly known as: KEFLEX   haloperidol 1 MG tablet Commonly known as: HALDOL   metroNIDAZOLE 500 MG tablet Commonly known as: FLAGYL       TAKE these medications    acetaminophen 650 MG suppository Commonly known as: TYLENOL Place 650 mg rectally every 4 (four) hours as needed for mild pain or fever.   aspirin EC 81 MG tablet Take 1 tablet (81 mg total) by mouth daily. Swallow whole.   bisacodyl 10 MG suppository Commonly known as: DULCOLAX Place 10 mg rectally daily as needed for constipation.   feeding supplement Liqd Take 237 mLs by mouth 3 (three) times daily between meals. What changed:  when to take this reasons to take this   hyoscyamine 0.125 MG SL tablet Commonly known as: LEVSIN SL Place 0.125 mg under the tongue every 4 (four) hours as needed (excess oral secretions).   LORazepam 1 MG tablet Commonly known as: ATIVAN Take 1 mg by mouth every 4 (four) hours as needed for anxiety or agitation.   melatonin 5 MG Tabs Take 1 tablet (5 mg total) by mouth at bedtime.   morphine CONCENTRATE 10 mg / 0.5 ml concentrated solution Take 5 mg by mouth every 2  (two) hours as needed (pain or air hunger).   ondansetron 8 MG disintegrating tablet Commonly known as: ZOFRAN-ODT Take 1 tablet (8 mg total) by mouth every 8 (eight) hours as needed for nausea or vomiting.   oxyCODONE-acetaminophen 10-325 MG tablet Commonly known as: PERCOCET Take 1 tablet by mouth every 8 (eight) hours as needed for pain.   promethazine 25 MG tablet Commonly known as: PHENERGAN Take 25 mg by mouth every 4 (four) hours as needed for nausea/vomiting.        Discharge Instructions: Please refer to Patient Instructions section of EMR for full details.  Patient was counseled important signs and symptoms that should prompt return to medical care, changes in medications, dietary instructions, activity restrictions, and follow up appointments.   Follow-Up Appointments:  Follow-up Information     Lilland, Alana, DO. Go on 01/18/2022.   Specialty: Family Medicine Why: At 10:30 am. Please arrive by 10:15 am. This is your hospital follow up appointment at the family medicine clinic. You will see Dr. Oleh Genin. Contact information: [place] [address] [facility] Follow up.   Why: [place] [phone]  Alcus Dad, MD 01/16/2022, 1:03 PM PGY-2, [place]

## 2022-01-16 NOTE — Progress Notes (Signed)
Received call from Lab at 0420 am about pt's Hgb 6.9, paged oncall provider. Pt wants provider to talk to his son, so provider called pt's son, but unable to reach and also  primary RN called, unable to reach pt's son, and left voice message for call back.

## 2022-01-17 ENCOUNTER — Telehealth: Payer: Self-pay

## 2022-01-17 ENCOUNTER — Other Ambulatory Visit (HOSPITAL_COMMUNITY): Payer: Self-pay

## 2022-01-17 NOTE — Telephone Encounter (Signed)
Patients daughter calls nurse line in regards to medications and apt.   Daughter reports she feels he will not be able to get out of bed for apt tomorrow. Daughter is requesting apt be virtual.   Will forward to provider who is seeing patient.

## 2022-01-17 NOTE — Telephone Encounter (Signed)
Transition Care Management Follow-up Telephone Call Date of discharge and from where: 6/4 [facility] How have you been since you were released from the hospital? The patient has been doing fair.  He was not feeling up to talking a lot on the phone but gave me permission to speak with his son [name]. Any questions or concerns? Yes They were concerned about paper work left at the office to be filled out.  I contacted the office and the papers are ready to be picked up at the front the information was relayed to [place].  Items Reviewed: Did the pt receive and understand the discharge instructions provided? Yes  Medications obtained and verified? Yes  Other?  [name] wanted to know who will fill his father's pain medication.  I advised him to talk with Hospice when they come today for the visit. Any new allergies since your discharge? No  Dietary orders reviewed? Yes Do you have support at home? Yes   Home Care and Equipment/Supplies: Were home health services ordered? yes If so, what is the name of the agency? [facility]  Has the agency set up a time to come to the patient's home? yes Were any new equipment or medical supplies ordered?  No What is the name of the medical supply agency? N/a Were you able to get the supplies/equipment? not applicable Do you have any questions related to the use of the equipment or supplies? No  Functional Questionnaire: (I = Independent and D = Dependent) ADLs: D  Bathing/Dressing- D  Meal Prep- D  Eating- I  Maintaining continence- D  Transferring/Ambulation- D  Managing Meds- D  Follow up appointments reviewed:  PCP Hospital f/u appt confirmed? Yes  Scheduled to see Dr. Oleh Genin on 01/18/22 @ 10:30. [facility] f/u appt confirmed? No  Scheduled to see N/a on n/a @ n/a. Are transportation arrangements needed? No  If their condition worsens, is the pt aware to call PCP or go to the Emergency Dept.? Yes Was the patient provided  with contact information for the PCP's office or ED? Yes Was to pt encouraged to call back with questions or concerns? Yes   Lazaro Arms RN, BSN, [facility] Care Management Coordinator Transition of Care  Phone: [phone]

## 2022-01-18 ENCOUNTER — Ambulatory Visit: Payer: Medicare Other | Admitting: Family Medicine

## 2022-01-18 ENCOUNTER — Telehealth: Payer: Self-pay | Admitting: Family Medicine

## 2022-01-18 NOTE — Telephone Encounter (Signed)
Clinical info completed on FMLA form.  Placed form in PCP's box for completion.    When form is completed, please route note to "RN Team" and place in wall pocket in front office.   Amos Micheals, CMA  

## 2022-01-18 NOTE — Telephone Encounter (Signed)
Son dropped of paperwork to be completed by doctor Family Medical leave.  Last seen in our office was 11/22/21.  Any questions please call patient.

## 2022-01-18 NOTE — Telephone Encounter (Signed)
Spoke with Dr. Oleh Genin.  Pt will need to be seen in person to appropriately assess weakness in extremity.  LMOVM for daughter. Christen Bame, CMA

## 2022-01-19 NOTE — Telephone Encounter (Signed)
Forms completed and placed in RN box.  Gladys Damme, MD [facility], PGY-3

## 2022-01-20 NOTE — Telephone Encounter (Signed)
From placed up front for pick up.   Copy made for batch scanning.  VM left for son informing.

## 2022-02-01 ENCOUNTER — Ambulatory Visit: Payer: Medicare Other | Admitting: Family Medicine

## 2022-04-15 DEATH — deceased
